# Patient Record
Sex: Female | Born: 1945 | Race: White | Hispanic: No | Marital: Married | Smoking: Never smoker
Health system: Southern US, Community
[De-identification: ages and names within clinical notes are randomized; demographics above are authoritative.]

## PROBLEM LIST (undated history)

## (undated) DIAGNOSIS — M1990 Unspecified osteoarthritis, unspecified site: Secondary | ICD-10-CM

## (undated) DIAGNOSIS — I272 Pulmonary hypertension, unspecified: Secondary | ICD-10-CM

## (undated) DIAGNOSIS — N390 Urinary tract infection, site not specified: Secondary | ICD-10-CM

## (undated) DIAGNOSIS — M719 Bursopathy, unspecified: Secondary | ICD-10-CM

## (undated) DIAGNOSIS — J42 Unspecified chronic bronchitis: Secondary | ICD-10-CM

## (undated) DIAGNOSIS — R4189 Other symptoms and signs involving cognitive functions and awareness: Secondary | ICD-10-CM

## (undated) DIAGNOSIS — E119 Type 2 diabetes mellitus without complications: Secondary | ICD-10-CM

## (undated) DIAGNOSIS — G259 Extrapyramidal and movement disorder, unspecified: Secondary | ICD-10-CM

## (undated) DIAGNOSIS — G20A1 Parkinson's disease without dyskinesia, without mention of fluctuations: Secondary | ICD-10-CM

## (undated) DIAGNOSIS — K219 Gastro-esophageal reflux disease without esophagitis: Secondary | ICD-10-CM

## (undated) DIAGNOSIS — G709 Myoneural disorder, unspecified: Secondary | ICD-10-CM

## (undated) DIAGNOSIS — H269 Unspecified cataract: Secondary | ICD-10-CM

## (undated) DIAGNOSIS — M159 Polyosteoarthritis, unspecified: Secondary | ICD-10-CM

## (undated) DIAGNOSIS — F419 Anxiety disorder, unspecified: Secondary | ICD-10-CM

## (undated) DIAGNOSIS — G20 Parkinson's disease: Secondary | ICD-10-CM

## (undated) DIAGNOSIS — Z96659 Presence of unspecified artificial knee joint: Secondary | ICD-10-CM

## (undated) DIAGNOSIS — M479 Spondylosis, unspecified: Secondary | ICD-10-CM

## (undated) DIAGNOSIS — K449 Diaphragmatic hernia without obstruction or gangrene: Secondary | ICD-10-CM

## (undated) DIAGNOSIS — F4321 Adjustment disorder with depressed mood: Secondary | ICD-10-CM

## (undated) DIAGNOSIS — M549 Dorsalgia, unspecified: Secondary | ICD-10-CM

## (undated) DIAGNOSIS — F1910 Other psychoactive substance abuse, uncomplicated: Secondary | ICD-10-CM

## (undated) DIAGNOSIS — M546 Pain in thoracic spine: Secondary | ICD-10-CM

## (undated) DIAGNOSIS — K229 Disease of esophagus, unspecified: Secondary | ICD-10-CM

## (undated) DIAGNOSIS — M67919 Unspecified disorder of synovium and tendon, unspecified shoulder: Secondary | ICD-10-CM

## (undated) DIAGNOSIS — G5600 Carpal tunnel syndrome, unspecified upper limb: Secondary | ICD-10-CM

## (undated) DIAGNOSIS — I10 Essential (primary) hypertension: Secondary | ICD-10-CM

## (undated) DIAGNOSIS — M419 Scoliosis, unspecified: Secondary | ICD-10-CM

## (undated) DIAGNOSIS — K209 Esophagitis, unspecified without bleeding: Secondary | ICD-10-CM

## (undated) DIAGNOSIS — F0390 Unspecified dementia without behavioral disturbance: Secondary | ICD-10-CM

## (undated) DIAGNOSIS — E785 Hyperlipidemia, unspecified: Secondary | ICD-10-CM

## (undated) DIAGNOSIS — Z8739 Personal history of other diseases of the musculoskeletal system and connective tissue: Secondary | ICD-10-CM

## (undated) DIAGNOSIS — F339 Major depressive disorder, recurrent, unspecified: Secondary | ICD-10-CM

## (undated) DIAGNOSIS — K222 Esophageal obstruction: Secondary | ICD-10-CM

## (undated) DIAGNOSIS — K2210 Ulcer of esophagus without bleeding: Secondary | ICD-10-CM

## (undated) DIAGNOSIS — T4145XA Adverse effect of unspecified anesthetic, initial encounter: Secondary | ICD-10-CM

## (undated) DIAGNOSIS — G8929 Other chronic pain: Secondary | ICD-10-CM

## (undated) DIAGNOSIS — M4120 Other idiopathic scoliosis, site unspecified: Secondary | ICD-10-CM

## (undated) DIAGNOSIS — T7840XA Allergy, unspecified, initial encounter: Secondary | ICD-10-CM

## (undated) HISTORY — DX: Type 2 diabetes mellitus without complications: E11.9

## (undated) HISTORY — DX: Other idiopathic scoliosis, site unspecified: M41.20

## (undated) HISTORY — PX: CHOLECYSTECTOMY OPEN: SUR202

## (undated) HISTORY — DX: Other psychoactive substance abuse, uncomplicated: F19.10

## (undated) HISTORY — DX: Unspecified osteoarthritis, unspecified site: M19.90

## (undated) HISTORY — PX: ESOPHAGOGASTRIC FUNDOPLASTY: SUR458

## (undated) HISTORY — PX: TUBAL LIGATION: SHX77

## (undated) HISTORY — DX: Pulmonary hypertension, unspecified: I27.20

## (undated) HISTORY — DX: Polyosteoarthritis, unspecified: M15.9

## (undated) HISTORY — PX: JOINT REPLACEMENT: SHX530

## (undated) HISTORY — PX: COLON SURGERY: SHX602

## (undated) HISTORY — DX: Major depressive disorder, recurrent, unspecified: F33.9

## (undated) HISTORY — PX: DILATION AND CURETTAGE OF UTERUS: SHX78

## (undated) HISTORY — DX: Urinary tract infection, site not specified: N39.0

## (undated) HISTORY — DX: Diaphragmatic hernia without obstruction or gangrene: K44.9

## (undated) HISTORY — DX: Hyperlipidemia, unspecified: E78.5

## (undated) HISTORY — DX: Essential (primary) hypertension: I10

## (undated) HISTORY — DX: Scoliosis, unspecified: M41.9

## (undated) HISTORY — DX: Disease of esophagus, unspecified: K22.9

## (undated) HISTORY — DX: Bursopathy, unspecified: M71.9

## (undated) HISTORY — DX: Carpal tunnel syndrome, unspecified upper limb: G56.00

## (undated) HISTORY — DX: Ulcer of esophagus without bleeding: K22.10

## (undated) HISTORY — DX: Anxiety disorder, unspecified: F41.9

## (undated) HISTORY — DX: Myoneural disorder, unspecified: G70.9

## (undated) HISTORY — PX: ABDOMINAL HYSTERECTOMY: SHX81

## (undated) HISTORY — DX: Unspecified cataract: H26.9

## (undated) HISTORY — DX: Presence of unspecified artificial knee joint: Z96.659

## (undated) HISTORY — DX: Esophageal obstruction: K22.2

## (undated) HISTORY — DX: Unspecified disorder of synovium and tendon, unspecified shoulder: M67.919

## (undated) HISTORY — DX: Extrapyramidal and movement disorder, unspecified: G25.9

## (undated) HISTORY — DX: Spondylosis, unspecified: M47.9

## (undated) HISTORY — DX: Allergy, unspecified, initial encounter: T78.40XA

---

## 1968-12-13 DIAGNOSIS — Z8739 Personal history of other diseases of the musculoskeletal system and connective tissue: Secondary | ICD-10-CM

## 1968-12-13 HISTORY — DX: Personal history of other diseases of the musculoskeletal system and connective tissue: Z87.39

## 2001-03-20 ENCOUNTER — Encounter: Payer: Self-pay | Admitting: Emergency Medicine

## 2001-03-20 ENCOUNTER — Emergency Department (HOSPITAL_COMMUNITY): Admission: EM | Admit: 2001-03-20 | Discharge: 2001-03-20 | Payer: Self-pay | Admitting: Emergency Medicine

## 2002-10-03 ENCOUNTER — Encounter: Payer: Self-pay | Admitting: Emergency Medicine

## 2002-10-03 ENCOUNTER — Emergency Department (HOSPITAL_COMMUNITY): Admission: EM | Admit: 2002-10-03 | Discharge: 2002-10-03 | Payer: Self-pay | Admitting: Emergency Medicine

## 2003-02-22 ENCOUNTER — Ambulatory Visit (HOSPITAL_COMMUNITY): Admission: RE | Admit: 2003-02-22 | Discharge: 2003-02-22 | Payer: Self-pay | Admitting: Sports Medicine

## 2003-02-22 ENCOUNTER — Encounter: Admission: RE | Admit: 2003-02-22 | Discharge: 2003-02-22 | Payer: Self-pay | Admitting: Family Medicine

## 2003-02-27 ENCOUNTER — Ambulatory Visit (HOSPITAL_COMMUNITY): Admission: RE | Admit: 2003-02-27 | Discharge: 2003-02-27 | Payer: Self-pay | Admitting: General Surgery

## 2003-02-27 ENCOUNTER — Ambulatory Visit (HOSPITAL_BASED_OUTPATIENT_CLINIC_OR_DEPARTMENT_OTHER): Admission: RE | Admit: 2003-02-27 | Discharge: 2003-02-27 | Payer: Self-pay | Admitting: General Surgery

## 2003-02-27 ENCOUNTER — Encounter (INDEPENDENT_AMBULATORY_CARE_PROVIDER_SITE_OTHER): Payer: Self-pay | Admitting: Specialist

## 2003-05-31 ENCOUNTER — Encounter: Admission: RE | Admit: 2003-05-31 | Discharge: 2003-05-31 | Payer: Self-pay | Admitting: Family Medicine

## 2003-06-06 ENCOUNTER — Encounter: Admission: RE | Admit: 2003-06-06 | Discharge: 2003-06-06 | Payer: Self-pay | Admitting: Family Medicine

## 2003-06-23 ENCOUNTER — Encounter: Admission: RE | Admit: 2003-06-23 | Discharge: 2003-06-23 | Payer: Self-pay | Admitting: Sports Medicine

## 2003-07-05 ENCOUNTER — Encounter: Admission: RE | Admit: 2003-07-05 | Discharge: 2003-07-05 | Payer: Self-pay | Admitting: Family Medicine

## 2003-08-22 ENCOUNTER — Encounter: Admission: RE | Admit: 2003-08-22 | Discharge: 2003-08-22 | Payer: Self-pay | Admitting: Family Medicine

## 2003-10-13 ENCOUNTER — Encounter: Admission: RE | Admit: 2003-10-13 | Discharge: 2003-10-13 | Payer: Self-pay | Admitting: Family Medicine

## 2003-10-18 ENCOUNTER — Encounter: Admission: RE | Admit: 2003-10-18 | Discharge: 2003-10-18 | Payer: Self-pay | Admitting: Sports Medicine

## 2003-11-21 ENCOUNTER — Encounter: Admission: RE | Admit: 2003-11-21 | Discharge: 2003-11-21 | Payer: Self-pay | Admitting: Family Medicine

## 2003-11-28 ENCOUNTER — Encounter: Admission: RE | Admit: 2003-11-28 | Discharge: 2003-11-28 | Payer: Self-pay | Admitting: Family Medicine

## 2004-01-09 ENCOUNTER — Ambulatory Visit: Payer: Self-pay | Admitting: Sports Medicine

## 2004-01-12 ENCOUNTER — Ambulatory Visit: Payer: Self-pay | Admitting: Family Medicine

## 2004-02-13 ENCOUNTER — Ambulatory Visit: Payer: Self-pay | Admitting: Sports Medicine

## 2004-02-13 ENCOUNTER — Encounter (INDEPENDENT_AMBULATORY_CARE_PROVIDER_SITE_OTHER): Payer: Self-pay | Admitting: Sports Medicine

## 2004-02-13 ENCOUNTER — Encounter (INDEPENDENT_AMBULATORY_CARE_PROVIDER_SITE_OTHER): Payer: Self-pay | Admitting: *Deleted

## 2004-02-13 LAB — CONVERTED CEMR LAB

## 2004-02-15 ENCOUNTER — Emergency Department (HOSPITAL_COMMUNITY): Admission: EM | Admit: 2004-02-15 | Discharge: 2004-02-15 | Payer: Self-pay | Admitting: Emergency Medicine

## 2004-03-04 ENCOUNTER — Emergency Department (HOSPITAL_COMMUNITY): Admission: EM | Admit: 2004-03-04 | Discharge: 2004-03-04 | Payer: Self-pay | Admitting: Family Medicine

## 2004-03-13 ENCOUNTER — Ambulatory Visit: Payer: Self-pay | Admitting: Family Medicine

## 2004-03-26 ENCOUNTER — Ambulatory Visit: Payer: Self-pay | Admitting: Sports Medicine

## 2004-04-16 ENCOUNTER — Ambulatory Visit: Payer: Self-pay | Admitting: Sports Medicine

## 2004-05-30 ENCOUNTER — Emergency Department (HOSPITAL_COMMUNITY): Admission: EM | Admit: 2004-05-30 | Discharge: 2004-05-30 | Payer: Self-pay | Admitting: *Deleted

## 2004-08-21 ENCOUNTER — Ambulatory Visit: Payer: Self-pay | Admitting: Sports Medicine

## 2004-11-05 ENCOUNTER — Emergency Department (HOSPITAL_COMMUNITY): Admission: EM | Admit: 2004-11-05 | Discharge: 2004-11-05 | Payer: Self-pay | Admitting: Emergency Medicine

## 2004-12-31 ENCOUNTER — Ambulatory Visit: Payer: Self-pay | Admitting: Sports Medicine

## 2005-01-02 ENCOUNTER — Ambulatory Visit: Payer: Self-pay | Admitting: Family Medicine

## 2005-02-02 ENCOUNTER — Emergency Department (HOSPITAL_COMMUNITY): Admission: EM | Admit: 2005-02-02 | Discharge: 2005-02-02 | Payer: Self-pay | Admitting: Family Medicine

## 2005-02-10 ENCOUNTER — Ambulatory Visit: Payer: Self-pay | Admitting: Sports Medicine

## 2005-04-01 ENCOUNTER — Ambulatory Visit: Payer: Self-pay | Admitting: Sports Medicine

## 2005-07-22 ENCOUNTER — Ambulatory Visit: Payer: Self-pay | Admitting: Sports Medicine

## 2005-07-30 ENCOUNTER — Ambulatory Visit: Payer: Self-pay | Admitting: Family Medicine

## 2005-08-12 ENCOUNTER — Ambulatory Visit: Payer: Self-pay | Admitting: Sports Medicine

## 2005-08-20 ENCOUNTER — Ambulatory Visit (HOSPITAL_COMMUNITY): Admission: RE | Admit: 2005-08-20 | Discharge: 2005-08-20 | Payer: Self-pay | Admitting: Sports Medicine

## 2005-08-20 ENCOUNTER — Encounter: Admission: RE | Admit: 2005-08-20 | Discharge: 2005-09-11 | Payer: Self-pay | Admitting: Sports Medicine

## 2005-08-29 ENCOUNTER — Ambulatory Visit: Payer: Self-pay | Admitting: Family Medicine

## 2005-09-23 ENCOUNTER — Encounter (INDEPENDENT_AMBULATORY_CARE_PROVIDER_SITE_OTHER): Payer: Self-pay | Admitting: Sports Medicine

## 2005-09-23 LAB — CONVERTED CEMR LAB: Creatinine, Ser: 1.1 mg/dL

## 2005-09-30 ENCOUNTER — Ambulatory Visit: Payer: Self-pay | Admitting: Sports Medicine

## 2005-10-13 ENCOUNTER — Encounter: Payer: Self-pay | Admitting: Physician Assistant

## 2005-11-10 ENCOUNTER — Encounter: Admission: RE | Admit: 2005-11-10 | Discharge: 2005-11-10 | Payer: Self-pay | Admitting: Sports Medicine

## 2005-11-11 ENCOUNTER — Ambulatory Visit: Payer: Self-pay | Admitting: Family Medicine

## 2005-12-11 ENCOUNTER — Encounter: Admission: RE | Admit: 2005-12-11 | Discharge: 2005-12-11 | Payer: Self-pay | Admitting: Sports Medicine

## 2005-12-24 ENCOUNTER — Ambulatory Visit: Payer: Self-pay | Admitting: Sports Medicine

## 2005-12-26 ENCOUNTER — Ambulatory Visit: Payer: Self-pay | Admitting: Family Medicine

## 2006-02-10 ENCOUNTER — Ambulatory Visit: Payer: Self-pay | Admitting: Sports Medicine

## 2006-06-11 DIAGNOSIS — F339 Major depressive disorder, recurrent, unspecified: Secondary | ICD-10-CM

## 2006-06-11 HISTORY — DX: Major depressive disorder, recurrent, unspecified: F33.9

## 2006-06-12 ENCOUNTER — Encounter (INDEPENDENT_AMBULATORY_CARE_PROVIDER_SITE_OTHER): Payer: Self-pay | Admitting: *Deleted

## 2006-08-04 ENCOUNTER — Ambulatory Visit: Payer: Self-pay | Admitting: Sports Medicine

## 2006-08-04 DIAGNOSIS — M159 Polyosteoarthritis, unspecified: Secondary | ICD-10-CM | POA: Insufficient documentation

## 2006-08-04 HISTORY — DX: Polyosteoarthritis, unspecified: M15.9

## 2006-08-04 LAB — CONVERTED CEMR LAB
ALT: 12 units/L (ref 0–35)
AST: 13 units/L (ref 0–37)
Albumin: 4.2 g/dL (ref 3.5–5.2)
Alkaline Phosphatase: 77 units/L (ref 39–117)
BUN: 16 mg/dL (ref 6–23)
CO2: 22 meq/L (ref 19–32)
Calcium: 9.6 mg/dL (ref 8.4–10.5)
Chloride: 105 meq/L (ref 96–112)
Cholesterol: 186 mg/dL (ref 0–200)
Creatinine, Ser: 1.08 mg/dL (ref 0.40–1.20)
Glucose, Bld: 100 mg/dL — ABNORMAL HIGH (ref 70–99)
HCT: 38 %
HDL: 44 mg/dL (ref >39)
Hemoglobin: 13.2 g/dL
Hgb A1c MFr Bld: 5.5 %
LDL Cholesterol: 81 mg/dL (ref 0–99)
MCV: 94 fL
Platelets: 348 10*3/uL
Potassium: 3.5 meq/L (ref 3.5–5.3)
RBC: 4.04 M/uL
Sodium: 143 meq/L (ref 135–145)
TSH: 2.069 microintl units/mL (ref 0.350–5.50)
Total Bilirubin: 0.3 mg/dL (ref 0.3–1.2)
Total CHOL/HDL Ratio: 4.2
Total Protein: 6.9 g/dL (ref 6.0–8.3)
Triglycerides: 304 mg/dL — ABNORMAL HIGH (ref <150)
VLDL: 61 mg/dL — ABNORMAL HIGH (ref 0–40)
WBC: 7.7 10*3/uL

## 2006-08-05 ENCOUNTER — Encounter (INDEPENDENT_AMBULATORY_CARE_PROVIDER_SITE_OTHER): Payer: Self-pay | Admitting: Sports Medicine

## 2006-08-05 DIAGNOSIS — E119 Type 2 diabetes mellitus without complications: Secondary | ICD-10-CM

## 2006-08-05 DIAGNOSIS — I10 Essential (primary) hypertension: Secondary | ICD-10-CM | POA: Insufficient documentation

## 2006-08-05 DIAGNOSIS — E785 Hyperlipidemia, unspecified: Secondary | ICD-10-CM | POA: Insufficient documentation

## 2006-08-05 HISTORY — DX: Type 2 diabetes mellitus without complications: E11.9

## 2006-08-06 ENCOUNTER — Telehealth: Payer: Self-pay | Admitting: *Deleted

## 2006-08-07 ENCOUNTER — Telehealth: Payer: Self-pay | Admitting: *Deleted

## 2006-08-17 ENCOUNTER — Ambulatory Visit: Payer: Self-pay | Admitting: Cardiology

## 2006-08-31 ENCOUNTER — Ambulatory Visit: Payer: Self-pay | Admitting: Cardiology

## 2006-10-12 ENCOUNTER — Ambulatory Visit: Payer: Self-pay

## 2006-10-12 ENCOUNTER — Encounter: Payer: Self-pay | Admitting: Family Medicine

## 2006-11-16 ENCOUNTER — Encounter: Payer: Self-pay | Admitting: Family Medicine

## 2006-11-19 ENCOUNTER — Telehealth: Payer: Self-pay | Admitting: *Deleted

## 2006-11-19 ENCOUNTER — Ambulatory Visit: Payer: Self-pay | Admitting: Family Medicine

## 2006-12-15 ENCOUNTER — Encounter: Payer: Self-pay | Admitting: Family Medicine

## 2006-12-17 ENCOUNTER — Ambulatory Visit: Payer: Self-pay | Admitting: Sports Medicine

## 2006-12-25 ENCOUNTER — Encounter: Payer: Self-pay | Admitting: Family Medicine

## 2007-01-04 ENCOUNTER — Emergency Department (HOSPITAL_COMMUNITY): Admission: EM | Admit: 2007-01-04 | Discharge: 2007-01-04 | Payer: Self-pay | Admitting: Family Medicine

## 2007-01-05 ENCOUNTER — Encounter (INDEPENDENT_AMBULATORY_CARE_PROVIDER_SITE_OTHER): Payer: Self-pay | Admitting: Family Medicine

## 2007-01-05 ENCOUNTER — Telehealth: Payer: Self-pay | Admitting: *Deleted

## 2007-01-05 ENCOUNTER — Ambulatory Visit: Payer: Self-pay | Admitting: Family Medicine

## 2007-01-27 ENCOUNTER — Ambulatory Visit: Payer: Self-pay | Admitting: Family Medicine

## 2007-01-27 LAB — CONVERTED CEMR LAB
ALT: 13 units/L (ref 0–35)
AST: 15 units/L (ref 0–37)
Albumin: 4.3 g/dL (ref 3.5–5.2)
Alkaline Phosphatase: 83 units/L (ref 39–117)
BUN: 16 mg/dL (ref 6–23)
CO2: 23 meq/L (ref 19–32)
Calcium: 9.5 mg/dL (ref 8.4–10.5)
Chloride: 104 meq/L (ref 96–112)
Cholesterol: 169 mg/dL (ref 0–200)
Creatinine, Ser: 0.94 mg/dL (ref 0.40–1.20)
Glucose, Bld: 125 mg/dL — ABNORMAL HIGH (ref 70–99)
HDL: 46 mg/dL (ref >39)
Hgb A1c MFr Bld: 5.7 %
LDL Cholesterol: 89 mg/dL (ref 0–99)
Potassium: 3.7 meq/L (ref 3.5–5.3)
Sodium: 142 meq/L (ref 135–145)
Total Bilirubin: 0.4 mg/dL (ref 0.3–1.2)
Total CHOL/HDL Ratio: 3.7
Total Protein: 6.7 g/dL (ref 6.0–8.3)
Triglycerides: 169 mg/dL — ABNORMAL HIGH (ref <150)
VLDL: 34 mg/dL (ref 0–40)

## 2007-01-29 ENCOUNTER — Ambulatory Visit: Payer: Self-pay | Admitting: Internal Medicine

## 2007-02-01 ENCOUNTER — Encounter: Payer: Self-pay | Admitting: Family Medicine

## 2007-02-01 DIAGNOSIS — K222 Esophageal obstruction: Secondary | ICD-10-CM | POA: Insufficient documentation

## 2007-03-30 ENCOUNTER — Ambulatory Visit: Payer: Self-pay | Admitting: Sports Medicine

## 2007-06-18 ENCOUNTER — Telehealth: Payer: Self-pay | Admitting: *Deleted

## 2007-06-28 ENCOUNTER — Telehealth: Payer: Self-pay | Admitting: Family Medicine

## 2007-08-18 ENCOUNTER — Ambulatory Visit: Payer: Self-pay | Admitting: Family Medicine

## 2007-08-18 DIAGNOSIS — D499 Neoplasm of unspecified behavior of unspecified site: Secondary | ICD-10-CM | POA: Insufficient documentation

## 2007-08-23 ENCOUNTER — Encounter: Payer: Self-pay | Admitting: *Deleted

## 2007-10-06 ENCOUNTER — Ambulatory Visit: Payer: Self-pay | Admitting: Family Medicine

## 2007-10-06 LAB — CONVERTED CEMR LAB: Hgb A1c MFr Bld: 5.9 %

## 2007-10-07 ENCOUNTER — Encounter: Payer: Self-pay | Admitting: Family Medicine

## 2007-10-07 ENCOUNTER — Telehealth: Payer: Self-pay | Admitting: *Deleted

## 2007-10-07 ENCOUNTER — Observation Stay (HOSPITAL_COMMUNITY): Admission: EM | Admit: 2007-10-07 | Discharge: 2007-10-08 | Payer: Self-pay | Admitting: Emergency Medicine

## 2007-10-07 ENCOUNTER — Ambulatory Visit: Payer: Self-pay | Admitting: Family Medicine

## 2007-10-10 IMAGING — CT CT ANGIO CHEST
1 of 4 series · 12 of 36 positions shown · IV contrast (Omnipaque 300)
Comparison: none

CLINICAL DATA: chest and upper back pain

[Series 2: thoracica_wo 3.0 b30f st · axial · 0.73mm/px · z∈[-269,-38]mm · 12 of 91 slices shown]
[im 7/91  lung]
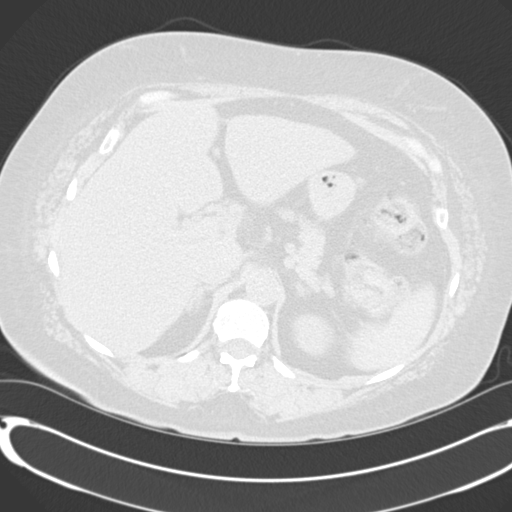
[im 14/91  mediastinal]
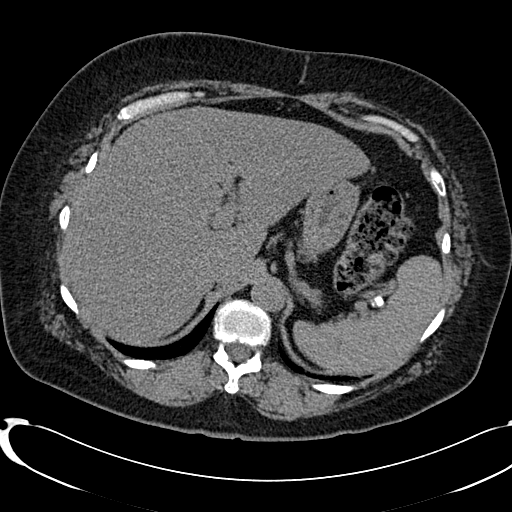
[im 21/91  lung]
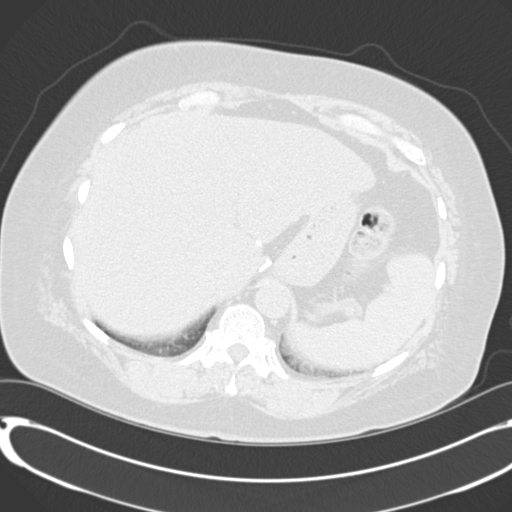
[im 28/91  mediastinal]
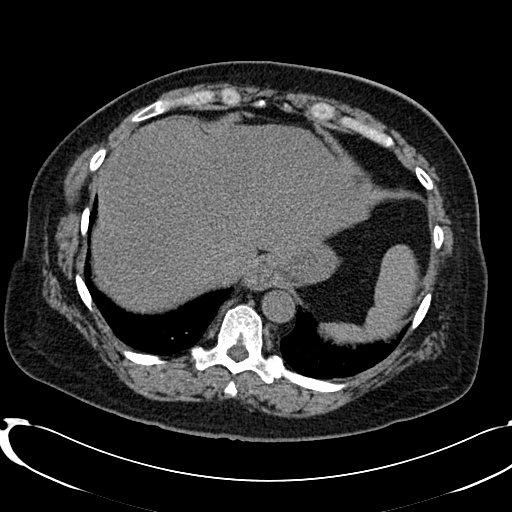
[im 35/91  lung]
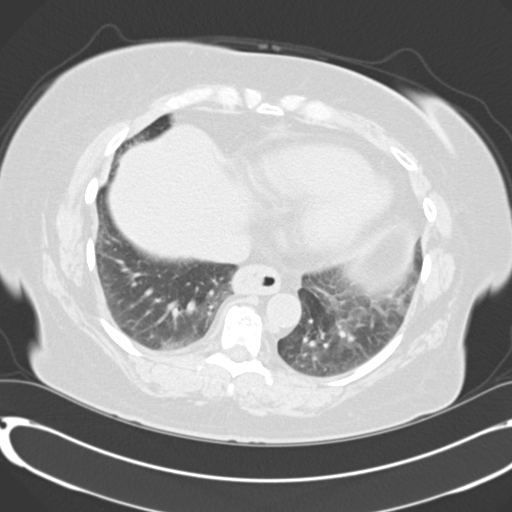
[im 42/91  mediastinal]
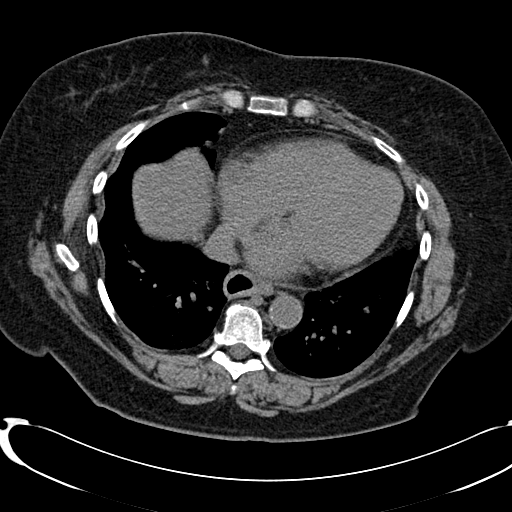
[im 49/91  lung]
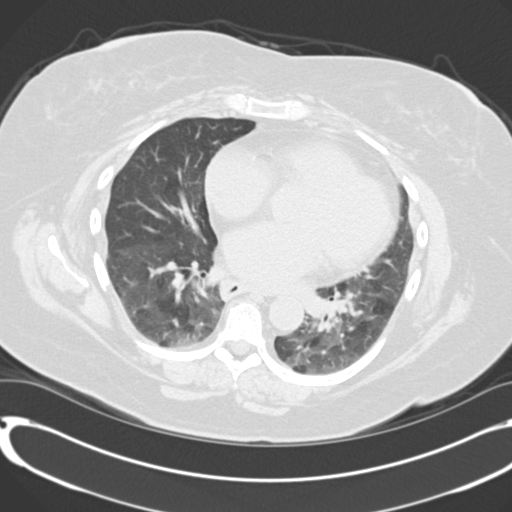
[im 56/91  mediastinal]
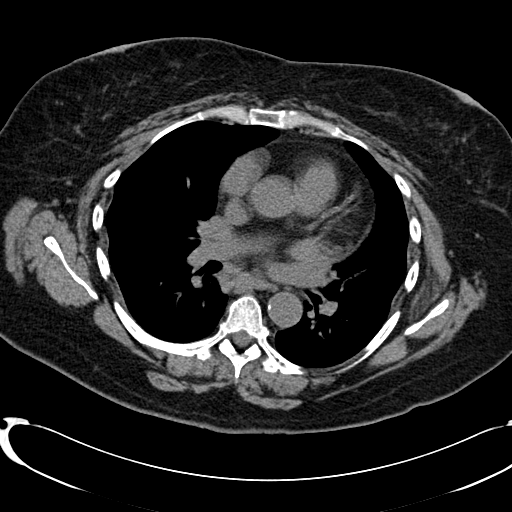
[im 63/91  lung]
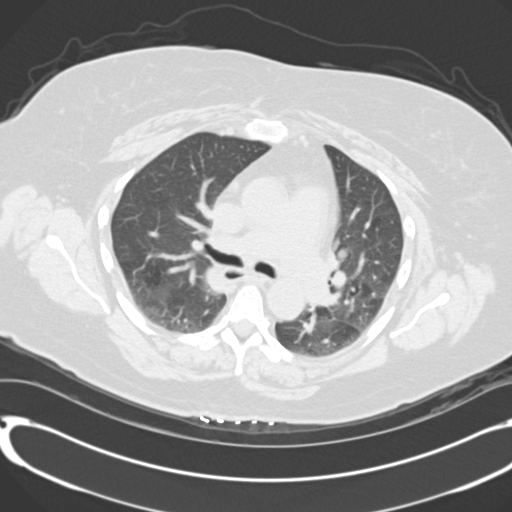
[im 70/91  mediastinal]
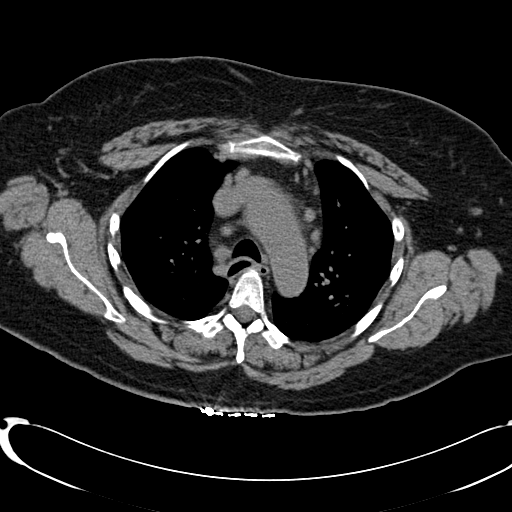
[im 77/91  lung]
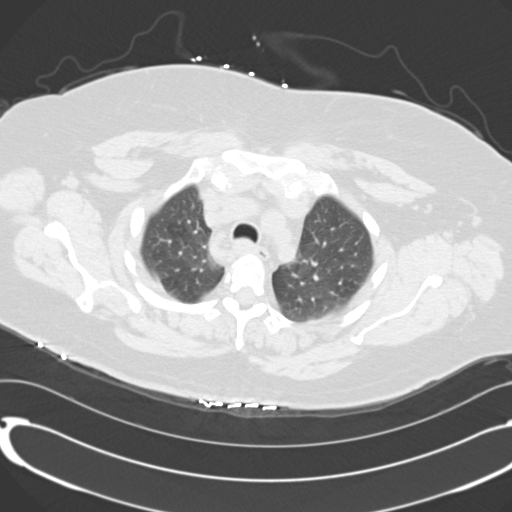
[im 84/91  mediastinal]
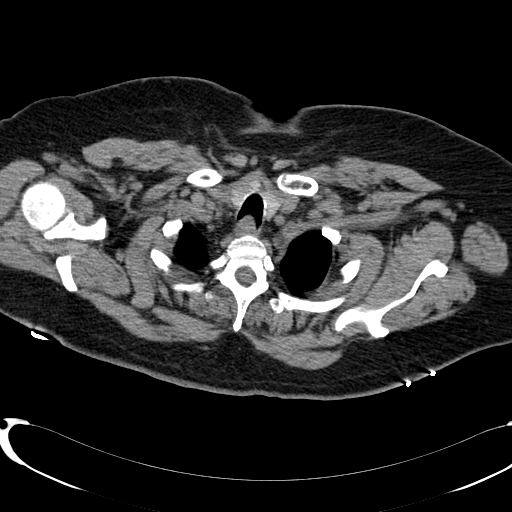

[12 of 36 positions shown; findings below may reference images not displayed]

CT angiogram chest with contrast:

Multidetector helical CT of the chest was obtained after  100 ml Omnipaque 300 
IV. CT multiplanar reconstructions were rendered to evaluate the vascular
anatomy.
No previous for comparison. The noncontrast scan shows patchy coronary and
aortic calcifications. No pleural or pericardial effusion. Vascular clips at GE
junction.

CT angiogram shows minimal atheromatous irregularity of the thoracic aorta
without dissection or stenosis. Classic branch anatomy of the brachiocephalic
arteries which are mildly tortuous without proximal stenosis. Good contrast
opacification of pulmonary artery branches without discrete filling defect to
suggest acute PE. Some images are degraded by patient breathing during the scan.
Subcentimeter right paratracheal, anterior mediastinal, and precarinal lymph
nodes are noted. No hilar adenopathy. Patchy groundglass opacities in the
bilateral upper lobes and dependent portions of both lower lobes without
confluent air space infiltrate. Visualized upper abdomen unremarkable. Coronal
and sagittal reconstructions confirm the above findings. Minimal spondylitic
changes in the thoracic spine.
IMPRESSION: 1. Negative for thoracic aortic dissection, aneurysm, or acute PE.
2. Patchy groundglass opacities bilaterally which are nonspecific, may represent
alveolitis or subsegmental atelectasis.
3. Coronary and aortic calcifications.

## 2007-10-12 ENCOUNTER — Telehealth: Payer: Self-pay | Admitting: *Deleted

## 2007-12-01 ENCOUNTER — Ambulatory Visit: Payer: Self-pay | Admitting: Family Medicine

## 2007-12-01 LAB — CONVERTED CEMR LAB
ALT: 21 units/L (ref 0–35)
AST: 23 units/L (ref 0–37)
Albumin: 4.4 g/dL (ref 3.5–5.2)
Alkaline Phosphatase: 88 units/L (ref 39–117)
BUN: 12 mg/dL (ref 6–23)
CO2: 23 meq/L (ref 19–32)
Calcium: 9.3 mg/dL (ref 8.4–10.5)
Chloride: 104 meq/L (ref 96–112)
Creatinine, Ser: 0.87 mg/dL (ref 0.40–1.20)
Glucose, Bld: 112 mg/dL — ABNORMAL HIGH (ref 70–99)
Hemoglobin: 13.2 g/dL
Potassium: 3.7 meq/L (ref 3.5–5.3)
Sodium: 143 meq/L (ref 135–145)
TSH: 1.349 microintl units/mL (ref 0.350–4.50)
Total Bilirubin: 0.6 mg/dL (ref 0.3–1.2)
Total Protein: 7 g/dL (ref 6.0–8.3)

## 2007-12-02 ENCOUNTER — Encounter: Payer: Self-pay | Admitting: Family Medicine

## 2007-12-21 ENCOUNTER — Telehealth: Payer: Self-pay | Admitting: *Deleted

## 2007-12-24 ENCOUNTER — Encounter: Admission: RE | Admit: 2007-12-24 | Discharge: 2007-12-24 | Payer: Self-pay | Admitting: Family Medicine

## 2007-12-27 ENCOUNTER — Telehealth: Payer: Self-pay | Admitting: *Deleted

## 2007-12-31 ENCOUNTER — Telehealth: Payer: Self-pay | Admitting: *Deleted

## 2008-01-05 ENCOUNTER — Telehealth: Payer: Self-pay | Admitting: Family Medicine

## 2008-01-05 ENCOUNTER — Ambulatory Visit: Payer: Self-pay | Admitting: Family Medicine

## 2008-01-05 DIAGNOSIS — G5600 Carpal tunnel syndrome, unspecified upper limb: Secondary | ICD-10-CM

## 2008-01-05 HISTORY — DX: Carpal tunnel syndrome, unspecified upper limb: G56.00

## 2008-01-05 LAB — CONVERTED CEMR LAB
Hgb A1c MFr Bld: 6.2 %
Vitamin B-12: 301 pg/mL (ref 211–911)

## 2008-01-10 ENCOUNTER — Ambulatory Visit: Payer: Self-pay | Admitting: Family Medicine

## 2008-01-20 ENCOUNTER — Encounter: Payer: Self-pay | Admitting: Family Medicine

## 2008-03-08 ENCOUNTER — Ambulatory Visit: Payer: Self-pay | Admitting: Family Medicine

## 2008-03-08 ENCOUNTER — Telehealth: Payer: Self-pay | Admitting: *Deleted

## 2008-03-22 ENCOUNTER — Encounter: Payer: Self-pay | Admitting: *Deleted

## 2008-03-22 ENCOUNTER — Encounter: Payer: Self-pay | Admitting: Family Medicine

## 2008-03-22 ENCOUNTER — Ambulatory Visit: Payer: Self-pay | Admitting: Family Medicine

## 2008-03-22 LAB — CONVERTED CEMR LAB
Hgb A1c MFr Bld: 6.1 %
Pap Smear: NORMAL

## 2008-03-24 ENCOUNTER — Telehealth: Payer: Self-pay | Admitting: *Deleted

## 2008-03-24 ENCOUNTER — Encounter: Payer: Self-pay | Admitting: Family Medicine

## 2008-03-29 ENCOUNTER — Telehealth: Payer: Self-pay | Admitting: Family Medicine

## 2008-03-30 ENCOUNTER — Emergency Department (HOSPITAL_COMMUNITY): Admission: EM | Admit: 2008-03-30 | Discharge: 2008-03-30 | Payer: Self-pay | Admitting: Family Medicine

## 2008-04-10 ENCOUNTER — Encounter: Payer: Self-pay | Admitting: Family Medicine

## 2008-04-11 ENCOUNTER — Encounter: Payer: Self-pay | Admitting: *Deleted

## 2008-04-11 ENCOUNTER — Telehealth: Payer: Self-pay | Admitting: *Deleted

## 2008-04-13 ENCOUNTER — Telehealth (INDEPENDENT_AMBULATORY_CARE_PROVIDER_SITE_OTHER): Payer: Self-pay | Admitting: *Deleted

## 2008-04-13 ENCOUNTER — Ambulatory Visit: Payer: Self-pay | Admitting: Family Medicine

## 2008-04-13 ENCOUNTER — Encounter: Payer: Self-pay | Admitting: Family Medicine

## 2008-04-13 LAB — CONVERTED CEMR LAB
ALT: 14 units/L (ref 0–35)
AST: 13 units/L (ref 0–37)
Albumin: 4 g/dL (ref 3.5–5.2)
Alkaline Phosphatase: 67 units/L (ref 39–117)
BUN: 18 mg/dL (ref 6–23)
CO2: 25 meq/L (ref 19–32)
Calcium: 9.2 mg/dL (ref 8.4–10.5)
Chloride: 101 meq/L (ref 96–112)
Cholesterol: 155 mg/dL (ref 0–200)
Creatinine, Ser: 1.03 mg/dL (ref 0.40–1.20)
Glucose, Bld: 110 mg/dL — ABNORMAL HIGH (ref 70–99)
HDL: 36 mg/dL — ABNORMAL LOW (ref >39)
Hgb A1c MFr Bld: 6.1 %
LDL Cholesterol: 72 mg/dL (ref 0–99)
Potassium: 3 meq/L — ABNORMAL LOW (ref 3.5–5.3)
Sodium: 139 meq/L (ref 135–145)
Total Bilirubin: 0.4 mg/dL (ref 0.3–1.2)
Total CHOL/HDL Ratio: 4.3
Total Protein: 6.4 g/dL (ref 6.0–8.3)
Triglycerides: 235 mg/dL — ABNORMAL HIGH (ref <150)
VLDL: 47 mg/dL — ABNORMAL HIGH (ref 0–40)

## 2008-04-19 ENCOUNTER — Encounter: Payer: Self-pay | Admitting: Family Medicine

## 2008-04-24 ENCOUNTER — Ambulatory Visit: Payer: Self-pay | Admitting: Family Medicine

## 2008-04-24 LAB — CONVERTED CEMR LAB
Bilirubin Urine: NEGATIVE
Glucose, Urine, Semiquant: NEGATIVE
Nitrite: POSITIVE
Protein, U semiquant: 100
Specific Gravity, Urine: 1.025
Urobilinogen, UA: 1
pH: 6

## 2008-05-02 ENCOUNTER — Telehealth: Payer: Self-pay | Admitting: Family Medicine

## 2008-06-28 ENCOUNTER — Ambulatory Visit: Payer: Self-pay | Admitting: Family Medicine

## 2008-06-28 ENCOUNTER — Ambulatory Visit (HOSPITAL_COMMUNITY): Admission: RE | Admit: 2008-06-28 | Discharge: 2008-06-28 | Payer: Self-pay | Admitting: Family Medicine

## 2008-06-28 LAB — CONVERTED CEMR LAB
Bilirubin Urine: NEGATIVE
Glucose, Urine, Semiquant: NEGATIVE
Nitrite: NEGATIVE
Protein, U semiquant: 30
Specific Gravity, Urine: >=1.03
Urobilinogen, UA: 0.2
WBC Urine, dipstick: NEGATIVE
pH: 5.5

## 2008-07-05 ENCOUNTER — Telehealth: Payer: Self-pay | Admitting: Family Medicine

## 2008-07-05 ENCOUNTER — Encounter: Payer: Self-pay | Admitting: Family Medicine

## 2008-07-13 DIAGNOSIS — T8859XA Other complications of anesthesia, initial encounter: Secondary | ICD-10-CM

## 2008-07-13 HISTORY — PX: TOTAL KNEE ARTHROPLASTY: SHX125

## 2008-07-13 HISTORY — DX: Other complications of anesthesia, initial encounter: T88.59XA

## 2008-08-03 ENCOUNTER — Telehealth: Payer: Self-pay | Admitting: Family Medicine

## 2008-08-08 ENCOUNTER — Inpatient Hospital Stay (HOSPITAL_COMMUNITY): Admission: RE | Admit: 2008-08-08 | Discharge: 2008-08-12 | Payer: Self-pay | Admitting: Orthopaedic Surgery

## 2008-08-14 ENCOUNTER — Encounter: Payer: Self-pay | Admitting: Family Medicine

## 2008-08-14 ENCOUNTER — Telehealth: Payer: Self-pay | Admitting: *Deleted

## 2008-09-20 ENCOUNTER — Ambulatory Visit: Payer: Self-pay | Admitting: Family Medicine

## 2008-09-20 LAB — CONVERTED CEMR LAB
ALT: 12 units/L (ref 0–35)
AST: 14 units/L (ref 0–37)
Albumin: 4 g/dL (ref 3.5–5.2)
Alkaline Phosphatase: 74 units/L (ref 39–117)
BUN: 8 mg/dL (ref 6–23)
CO2: 23 meq/L (ref 19–32)
Calcium: 9.6 mg/dL (ref 8.4–10.5)
Chloride: 102 meq/L (ref 96–112)
Creatinine, Ser: 1.09 mg/dL (ref 0.40–1.20)
Glucose, Bld: 134 mg/dL — ABNORMAL HIGH (ref 70–99)
HCT: 41.4 % (ref 36.0–46.0)
Hemoglobin: 13 g/dL (ref 12.0–15.0)
Hgb A1c MFr Bld: 5.4 %
MCHC: 31.4 g/dL (ref 30.0–36.0)
MCV: 96.1 fL (ref 78.0–100.0)
Platelets: 413 10*3/uL — ABNORMAL HIGH (ref 150–400)
Potassium: 3.5 meq/L (ref 3.5–5.3)
RBC: 4.31 M/uL (ref 3.87–5.11)
RDW: 13.8 % (ref 11.5–15.5)
Sodium: 141 meq/L (ref 135–145)
TSH: 1.473 microintl units/mL (ref 0.350–4.500)
Total Bilirubin: 0.3 mg/dL (ref 0.3–1.2)
Total Protein: 6.8 g/dL (ref 6.0–8.3)
WBC: 6.8 10*3/uL (ref 4.0–10.5)

## 2008-09-21 ENCOUNTER — Telehealth: Payer: Self-pay | Admitting: *Deleted

## 2008-09-22 ENCOUNTER — Encounter: Payer: Self-pay | Admitting: Family Medicine

## 2008-10-26 ENCOUNTER — Telehealth: Payer: Self-pay | Admitting: *Deleted

## 2008-11-30 ENCOUNTER — Ambulatory Visit: Payer: Self-pay | Admitting: Family Medicine

## 2008-11-30 DIAGNOSIS — L819 Disorder of pigmentation, unspecified: Secondary | ICD-10-CM | POA: Insufficient documentation

## 2008-12-06 ENCOUNTER — Telehealth (INDEPENDENT_AMBULATORY_CARE_PROVIDER_SITE_OTHER): Payer: Self-pay | Admitting: *Deleted

## 2009-01-24 ENCOUNTER — Ambulatory Visit: Payer: Self-pay | Admitting: Family Medicine

## 2009-01-24 DIAGNOSIS — M67919 Unspecified disorder of synovium and tendon, unspecified shoulder: Secondary | ICD-10-CM

## 2009-01-24 DIAGNOSIS — M719 Bursopathy, unspecified: Secondary | ICD-10-CM

## 2009-01-24 HISTORY — DX: Unspecified disorder of synovium and tendon, unspecified shoulder: M67.919

## 2009-01-24 LAB — CONVERTED CEMR LAB
ALT: 11 units/L (ref 0–35)
AST: 16 units/L (ref 0–37)
Albumin: 4.4 g/dL (ref 3.5–5.2)
Alkaline Phosphatase: 69 units/L (ref 39–117)
BUN: 14 mg/dL (ref 6–23)
CO2: 26 meq/L (ref 19–32)
Calcium: 9.5 mg/dL (ref 8.4–10.5)
Chloride: 99 meq/L (ref 96–112)
Creatinine, Ser: 1 mg/dL (ref 0.40–1.20)
Direct LDL: 103 mg/dL — ABNORMAL HIGH
Glucose, Bld: 105 mg/dL — ABNORMAL HIGH (ref 70–99)
Hgb A1c MFr Bld: 5.3 %
Potassium: 3.6 meq/L (ref 3.5–5.3)
Sodium: 139 meq/L (ref 135–145)
Total Bilirubin: 0.5 mg/dL (ref 0.3–1.2)
Total Protein: 6.8 g/dL (ref 6.0–8.3)
Uric Acid, Serum: 6.5 mg/dL (ref 2.4–7.0)

## 2009-01-25 ENCOUNTER — Encounter (INDEPENDENT_AMBULATORY_CARE_PROVIDER_SITE_OTHER): Payer: Self-pay

## 2009-01-29 ENCOUNTER — Encounter: Payer: Self-pay | Admitting: Family Medicine

## 2009-02-05 ENCOUNTER — Encounter: Admission: RE | Admit: 2009-02-05 | Discharge: 2009-02-05 | Payer: Self-pay | Admitting: Family Medicine

## 2009-02-14 ENCOUNTER — Ambulatory Visit: Payer: Self-pay | Admitting: Family Medicine

## 2009-02-14 ENCOUNTER — Telehealth (INDEPENDENT_AMBULATORY_CARE_PROVIDER_SITE_OTHER): Payer: Self-pay | Admitting: Family Medicine

## 2009-02-14 ENCOUNTER — Encounter: Admission: RE | Admit: 2009-02-14 | Discharge: 2009-02-14 | Payer: Self-pay | Admitting: Family Medicine

## 2009-02-20 ENCOUNTER — Encounter: Admission: RE | Admit: 2009-02-20 | Discharge: 2009-02-20 | Payer: Self-pay | Admitting: Family Medicine

## 2009-02-28 ENCOUNTER — Encounter: Payer: Self-pay | Admitting: Family Medicine

## 2009-03-28 ENCOUNTER — Ambulatory Visit: Payer: Self-pay | Admitting: Family Medicine

## 2009-03-28 DIAGNOSIS — M479 Spondylosis, unspecified: Secondary | ICD-10-CM

## 2009-03-28 DIAGNOSIS — M4120 Other idiopathic scoliosis, site unspecified: Secondary | ICD-10-CM

## 2009-03-28 HISTORY — DX: Spondylosis, unspecified: M47.9

## 2009-03-28 HISTORY — DX: Other idiopathic scoliosis, site unspecified: M41.20

## 2009-06-04 ENCOUNTER — Encounter: Payer: Self-pay | Admitting: Family Medicine

## 2009-07-11 ENCOUNTER — Ambulatory Visit: Payer: Self-pay | Admitting: Family Medicine

## 2009-07-11 LAB — CONVERTED CEMR LAB
ALT: 15 units/L (ref 0–35)
AST: 19 units/L (ref 0–37)
Albumin: 4.5 g/dL (ref 3.5–5.2)
Alkaline Phosphatase: 71 units/L (ref 39–117)
BUN: 13 mg/dL (ref 6–23)
CO2: 22 meq/L (ref 19–32)
Calcium: 9.9 mg/dL (ref 8.4–10.5)
Chloride: 101 meq/L (ref 96–112)
Creatinine, Ser: 0.92 mg/dL (ref 0.40–1.20)
Glucose, Bld: 101 mg/dL — ABNORMAL HIGH (ref 70–99)
HCT: 39.2 % (ref 36.0–46.0)
Hemoglobin: 13.1 g/dL (ref 12.0–15.0)
Hgb A1c MFr Bld: 5.6 %
MCHC: 33.4 g/dL (ref 30.0–36.0)
MCV: 93.6 fL (ref 78.0–100.0)
Platelets: 332 10*3/uL (ref 150–400)
Potassium: 3.5 meq/L (ref 3.5–5.3)
RBC: 4.19 M/uL (ref 3.87–5.11)
RDW: 12.5 % (ref 11.5–15.5)
Sodium: 139 meq/L (ref 135–145)
TSH: 2.841 microintl units/mL (ref 0.350–4.500)
Total Bilirubin: 0.4 mg/dL (ref 0.3–1.2)
Total Protein: 7.2 g/dL (ref 6.0–8.3)
WBC: 6.2 10*3/uL (ref 4.0–10.5)

## 2009-07-13 ENCOUNTER — Encounter: Payer: Self-pay | Admitting: Family Medicine

## 2009-08-29 ENCOUNTER — Ambulatory Visit: Payer: Self-pay | Admitting: Family Medicine

## 2009-09-20 ENCOUNTER — Telehealth: Payer: Self-pay | Admitting: *Deleted

## 2009-09-21 ENCOUNTER — Telehealth (INDEPENDENT_AMBULATORY_CARE_PROVIDER_SITE_OTHER): Payer: Self-pay | Admitting: *Deleted

## 2009-10-03 ENCOUNTER — Telehealth (INDEPENDENT_AMBULATORY_CARE_PROVIDER_SITE_OTHER): Payer: Self-pay | Admitting: Family Medicine

## 2009-11-12 ENCOUNTER — Telehealth: Payer: Self-pay | Admitting: *Deleted

## 2009-12-18 ENCOUNTER — Telehealth: Payer: Self-pay | Admitting: *Deleted

## 2009-12-25 ENCOUNTER — Telehealth: Payer: Self-pay | Admitting: *Deleted

## 2009-12-31 ENCOUNTER — Encounter: Payer: Self-pay | Admitting: Family Medicine

## 2010-01-16 ENCOUNTER — Ambulatory Visit: Payer: Self-pay | Admitting: Family Medicine

## 2010-01-16 DIAGNOSIS — D649 Anemia, unspecified: Secondary | ICD-10-CM | POA: Insufficient documentation

## 2010-01-16 DIAGNOSIS — G20 Parkinson's disease: Secondary | ICD-10-CM | POA: Insufficient documentation

## 2010-01-16 LAB — CONVERTED CEMR LAB
ALT: 18 units/L (ref 0–35)
AST: 24 units/L (ref 0–37)
Albumin: 4.4 g/dL (ref 3.5–5.2)
Alkaline Phosphatase: 66 units/L (ref 39–117)
BUN: 11 mg/dL (ref 6–23)
CO2: 29 meq/L (ref 19–32)
Calcium: 9.9 mg/dL (ref 8.4–10.5)
Chloride: 101 meq/L (ref 96–112)
Creatinine, Ser: 0.89 mg/dL (ref 0.40–1.20)
Glucose, Bld: 109 mg/dL — ABNORMAL HIGH (ref 70–99)
HCT: 41.9 % (ref 36.0–46.0)
Hemoglobin: 13.7 g/dL (ref 12.0–15.0)
Hgb A1c MFr Bld: 5.6 %
MCHC: 32.7 g/dL (ref 30.0–36.0)
MCV: 96.8 fL (ref 78.0–100.0)
Platelets: 344 10*3/uL (ref 150–400)
Potassium: 3.8 meq/L (ref 3.5–5.3)
RBC: 4.33 M/uL (ref 3.87–5.11)
RDW: 12.7 % (ref 11.5–15.5)
Sodium: 140 meq/L (ref 135–145)
TSH: 1.574 microintl units/mL (ref 0.350–4.500)
Total Bilirubin: 0.4 mg/dL (ref 0.3–1.2)
Total Protein: 6.6 g/dL (ref 6.0–8.3)
WBC: 7.5 10*3/uL (ref 4.0–10.5)

## 2010-01-18 ENCOUNTER — Encounter: Payer: Self-pay | Admitting: Family Medicine

## 2010-04-17 ENCOUNTER — Ambulatory Visit
Admission: RE | Admit: 2010-04-17 | Discharge: 2010-04-17 | Payer: Self-pay | Source: Home / Self Care | Attending: Family Medicine | Admitting: Family Medicine

## 2010-04-18 ENCOUNTER — Telehealth: Payer: Self-pay | Admitting: Family Medicine

## 2010-04-26 ENCOUNTER — Encounter: Payer: Self-pay | Admitting: Family Medicine

## 2010-04-29 ENCOUNTER — Ambulatory Visit: Admission: RE | Admit: 2010-04-29 | Discharge: 2010-04-29 | Payer: Self-pay | Source: Home / Self Care

## 2010-05-05 ENCOUNTER — Encounter: Payer: Self-pay | Admitting: Sports Medicine

## 2010-05-14 NOTE — Progress Notes (Signed)
Summary: Appt and medication issue   Phone Note Call from Patient Call back at Home Phone [phone]   Summary of Call: Pt states she is to f/u with Dr. Jennette Kettle at the beginning of January, advised pt that MD is booked until 1/27 and pt states she has to be seen by her before that and that she also needs to discuss one of her meds with someone as there is a problem according to her.  Advised pt that rn would call her as soon as they could. Initial call taken by: Haydee Salter,  April 13, 2008 10:26 AM  Follow-up for Phone Call        Patient wants to d/c Wellbutrin and only take the Trazadone, feels she no longer needs to be on both for depression. Also patient states that she will need a refill on trazadone if Dr Jennette Kettle is unable to see her before 1/27. Message to MD Follow-up by: ASHA BENTON LPN,  April 13, 2008 11:15 AM  Additional Follow-up for Phone Call Additional follow up Details #1::        wpu;d recommend she stay on BOTH as we just restarted the wellbutrin. I had already called in a refill for the wellbutrin--have her let us know when that runs out and I can call in another then Thanks!  [name] NEAL MD  April 13, 2008 11:48 AM     Additional Follow-up for Phone Call Additional follow up Details #2::    Spoke with pt via phone advising Dr Jennette Kettle recommends she continue taking the wellbutrin and trazadone as she just started taking the wellbutrin- pt agreeable and advised to call office wihen she runs out of medication as Dr Jennette Kettle will be more than happy to refill meds. Follow-up by: Dedra Skeens CMA,,  April 17, 2008 10:24 AM

## 2010-05-14 NOTE — Progress Notes (Signed)
Summary: Rx   Phone Note Call from Patient Call back at Home Phone [phone] Call back at Work Phone [phone]   Summary of Call: wants to speak with Dr. Donnetta Hail nurse about "her medications." Initial call taken by: Haydee Salter,  May 02, 2008 9:23 AM  Follow-up for Phone Call        called pt. pt reports that after taking Trazedone she feels like there is 'no elephant sitting on her chest' anymore. after 7 1/2 years. wants to use it for an antidepressant. fwd. to Dr.Neal for review. Follow-up by: Arlyss Repress CMA,,  May 02, 2008 9:34 AM  Additional Follow-up for Phone Call Additional follow up Details #1::        DEAR WHITE TEAM I think she is wanting to stop the wellbutrin and just stay on trazodone--I suspect she would do bbetter on the CURRENT COMBO of both--see if indeed thatis what she is taking. But u;timately if she want to stop teh wellbutrin then ok let me know what she decides Thanks!  SARA NEAL MD  May 05, 2008 3:16 PM     Additional Follow-up for Phone Call Additional follow up Details #2::    pt states she did want to stop wellbutrin but wanted to know how you felt about it. Advised her of your recommendation to continue the combo. Pt agrees and states she will need an rx for trazadone. Pharmacy has been updated. She states this is not urgent and can wait until monday. Follow-up by: Alphia Kava,  May 05, 2008 3:58 PM    Prescriptions: TRAZODONE HCL 50 MG TABS (TRAZODONE HCL) 1/2 -1 by mouth 1 hour before bed time as needed insomnia  #30 x 3   Entered and Authorized by:   Denny Levy MD   Signed by:   Denny Levy MD on 05/08/2008   Method used:   [facility]  [address] [id number]       [address]       [place], [place]  [address]       Ph: [phone] or [phone]       Fax: [phone]   RxID:   [id number]

## 2010-05-14 NOTE — Letter (Signed)
Summary: LAB Letter  [facility]  [address]   [place], [place] [address]   Phone: [phone]  Fax: [phone]    01/29/2009  [facility] [address] [place], [place]  [address]  Dear Ms. Marcella,  Your A1C was great at 5.3. Your LDL cholesterol should be less than 100 and it looks good at 103. All of the other lab work was normal.         Sincerely,   Denny Levy MD  Appended Document: LAB Letter mailed.

## 2010-05-14 NOTE — Progress Notes (Signed)
    left message for [facility] that it was ok to fill all meds with a 90 day supply at a time per Dr. Sammie Bench RN  March 24, 2008 2:12 PM

## 2010-05-14 NOTE — Progress Notes (Signed)
Summary: Triage   Phone Note Call from Patient Call back at Home Phone [phone]   Reason for Call: Talk to Nurse Summary of Call: Pt states she has the flu and would like to know what she can do from home Initial call taken by: Haydee Salter,  December 31, 2007 9:05 AM  Follow-up for Phone Call        c/o diarrhea, vomiting, chill, HA, legs ache. taking otc. started to give her come care advice & she stated she knew all that & hung up. I called back, told her no appt here open & could use urgent care. she said she was going to the hospital "and make everyone sick" hung up on me again. reported to K. Malen Gauze, RN Follow-up by: Golden Circle RN,  December 31, 2007 9:52 AM

## 2010-05-14 NOTE — Assessment & Plan Note (Signed)
Summary: PNEUMONIA SHOT AND READ PPD WP   Nurse Visit   Vital Signs:  Patient Profile:   65 Years Old Female Temp:     98.5 degrees F  Vitals Entered By: Jacki Cones RN (January 29, 2007 9:58 AM)                 Prior Medications: HYDROCHLOROTHIAZIDE 25 MG  TABS (HYDROCHLOROTHIAZIDE) 1 by mouth once daily ROBITUSSIN DM 100-10 MG/5ML  SYRP (DEXTROMETHORPHAN-GUAIFENESIN) 10 ml by mouth q 4-6 hours as needed cough Disp: QS AMLODIPINE BESYLATE 10 MG  TABS (AMLODIPINE BESYLATE) 1 by mouth qd SIMVASTATIN 40 MG  TABS (SIMVASTATIN) 1 by mouth once daily ACTOPLUS MET 15-500 MG  TABS (PIOGLITAZONE HCL-METFORMIN HCL) 1 tab by mouth two times a day BUPROPION HCL 100 MG  TB12 (BUPROPION HCL) 1 by mouth two times a day CELEBREX 200 MG  CAPS (CELECOXIB) 1 by mouth once daily as needed knee pain Current Allergies: LISINOPRIL (LISINOPRIL)   Pneumovax Vaccine    Vaccine Type: Pneumovax    Site: right deltoid    Mfr: Merck    Dose: 0.5 ml    Route: IM    Given by: AMY MARTIN RN    Exp. Date: 09/03/2008    Lot #: [id number]    VIS given: 11/10/95 version given January 29, 2007.  PPD Results    Date of reading: 01/29/2007    Results: < 5mm    Interpretation: negative   Orders Added: 1)  Pneumococcal Vaccine [90732] 2)  Admin 1st Vaccine [90471] 3)  Est Level 1- [facility] [47829]    ]

## 2010-05-14 NOTE — Assessment & Plan Note (Signed)
Summary: f/u eo   Vital Signs:  Patient profile:   65 year old female Weight:      176.4 pounds Temp:     98.4 degrees F oral Pulse rate:   71 / minute Pulse rhythm:   regular BP sitting:   131 / 81  (left arm) Cuff size:   large  Vitals Entered By: Loralee Pacas CMA (March 28, 2009 10:36 AM)  Primary Care Provider:  Denny Levy MD   History of Present Illness: f/u back and hip pain. Her hip is much better. We switched her from the vicodin to the tylenol#3 as she thought the vicodin was too strong---but then she discovered the tylenol#3 was not helping at all so now she wants to go back to vicodin.  back pain is diffusely in lumbar area and she is having recurrent mid back pain esp ion right side. No new injury. No SOB, no nausea. No leg weakness or numbness.  Allergies: 1)  Lisinopril (Lisinopril)  Physical Exam  General:  alert and well-developed.   Msk:  trigger point tenderness mid thoracic back, lateral muescles on right. Worse with lateral rotation. Diffusely tender to palpation B lumbar area. Additional Exam:  reviewed her x rays of thoracic spine. some mild to mod degenerative change. mild scoliosis.   Impression & Recommendations:  Problem # 1:  ARTHRITIS, BACK (ICD-721.90)  Orders: Physical Therapy Referral (PT) [facility]- Est  Level 4 (99214) msk back pain--I suspect after her knee surgery she may have changed her gait and this has aggravated her back. willset up for PT. Switch her back to vicodin. f/u 4-6 w  Complete Medication List: 1)  Hydrochlorothiazide 25 Mg Tabs (Hydrochlorothiazide) .Marland Kitchen.. 1 by mouth once daily 2)  Amlodipine Besylate 10 Mg Tabs (Amlodipine besylate) .Marland Kitchen.. 1 by mouth qd 3)  Simvastatin 40 Mg Tabs (Simvastatin) .Marland Kitchen.. 1 by mouth once daily 4)  Potassium Chloride 20 Meq Pack (Potassium chloride) .... 2 by mouth once daily 5)  Anacin 81 Mg Tbec (Aspirin) .... Once daily 6)  Metformin Hcl 850 Mg Tabs (Metformin hcl) .Marland Kitchen.. 1 by mouth once daily to  replace actos met 7)  Clonidine Hcl 0.2 Mg Tabs (Clonidine hcl) .Marland Kitchen.. 1 by mouth qhs 8)  Ibuprofen 800 Mg Tabs (Ibuprofen) .Marland Kitchen.. 1 by mouth two times a day or three times a day as needed pain 9)  Trazodone Hcl 50 Mg Tabs (Trazodone hcl) .... 2 by mouth qhs 10)  Vitamin B 12 Injection  .Marland Kitchen.. 1000 micrograms q moth im 11)  Ultrameter Strips  .... Disp box checks qd 12)  Lancets  .... Use as directed testing once daily disp box 13)  Oxybutynin Chloride 5 Mg Tabs (Oxybutynin chloride) .Marland Kitchen.. 1 by mouth qd 14)  Vicodin 5-500 Mg Tabs (Hydrocodone-acetaminophen) .Marland Kitchen.. 1-2 by mouth at bedtime as needed back pain Prescriptions: VICODIN 5-500 MG TABS (HYDROCODONE-ACETAMINOPHEN) 1-2 by mouth at bedtime as needed back pain  #60 x 5   Entered and Authorized by:   Denny Levy MD   Signed by:   Denny Levy MD on 03/28/2009   Method used:   Telephoned to ...       [facility]  [address] [phone]* (retail)       [address]       [place], [place]  [address]       Ph: [phone] or [phone]       Fax: [phone]   RxID:   [id number]

## 2010-05-14 NOTE — Letter (Signed)
Summary: LAB Letter  [facility]  [address]   [place], [place] [address]   Phone: [phone]  Fax: [phone]    12/02/2007  [facility] [address] [place], [place]  [address]  Dear Ms. Brune,   Your hemoglobin is normal at 13.2. Your thyroid test, kidney and liver function, electrolytes and glucosea are all in normal range. There is nothing here that would give [place] a clue as to why you are so fatigued.        Sincerely,   Denny Levy MD [facility]  Appended Document: LAB Letter sent

## 2010-05-14 NOTE — Progress Notes (Signed)
Phone Note Outgoing Call       DEAR WHITE TEAM please call her (cell is [phone]) and tell her there is NO compression fracture in her back. She has some (long standing) scoliosis that is very mild and a little arthritis. Ihope the tylenol #3 will help. If she is not doing better with her pain in 2 weeks, have her rtc. Thanks!  Denny Levy MD  February 14, 2009 2:08 PM    above message given to patient.  Patient requests a bone density scan. will send message to MD. Theresia Lo RN  February 14, 2009 2:23 PM Ok I have ordered if you can set up Thanks!  Denny Levy MD  February 14, 2009 2:51 PM   appointment scheduled 02/20/2009 at [facility]. message left on patient voicemail to return call. also need to know if patient has ever had a Dexa Scan. Theresia Lo RN  February 14, 2009 4:08 PM    patient calls back and information given about appointment. she had never had a Dexa Scan. Theresia Lo RN  February 14, 2009 4:08 PM   Complete Medication List: 1)  Hydrochlorothiazide 25 Mg Tabs (Hydrochlorothiazide) .Marland Kitchen.. 1 by mouth once daily 2)  Amlodipine Besylate 10 Mg Tabs (Amlodipine besylate) .Marland Kitchen.. 1 by mouth qd 3)  Simvastatin 40 Mg Tabs (Simvastatin) .Marland Kitchen.. 1 by mouth once daily 4)  Potassium Chloride 20 Meq Pack (Potassium chloride) .... 2 by mouth once daily 5)  Anacin 81 Mg Tbec (Aspirin) .... Once daily 6)  Metformin Hcl 850 Mg Tabs (Metformin hcl) .Marland Kitchen.. 1 by mouth once daily to replace actos met 7)  Clonidine Hcl 0.2 Mg Tabs (Clonidine hcl) .Marland Kitchen.. 1 by mouth qhs 8)  Ibuprofen 800 Mg Tabs (Ibuprofen) .Marland Kitchen.. 1 by mouth two times a day or three times a day as needed pain 9)  Trazodone Hcl 50 Mg Tabs (Trazodone hcl) .... 2 by mouth qhs 10)  Vitamin B 12 Injection  .Marland Kitchen.. 1000 micrograms q moth im 11)  Ultrameter Strips  .... Disp box checks qd 12)  Lancets  .... Use as directed testing once daily disp box 13)  Oxybutynin Chloride 5 Mg Tabs (Oxybutynin chloride) .Marland Kitchen.. 1 by mouth  qd 14)  Vicodin 5-500 Mg Tabs (Hydrocodone-acetaminophen) .Marland Kitchen.. 1-2 by mouth at bedtime as needed low back pain 15)  Tylenol With Codeine #3 300-30 Mg Tabs (Acetaminophen-codeine) .Marland Kitchen.. 1-2 by mouth q 6-8 hrs as needed back pain    Prevention & Chronic Care Immunizations   Influenza vaccine: Fluvax Non-MCR  (01/24/2009)   Influenza vaccine due: 01/04/2009    Tetanus booster: 08/13/2003: Done.   Tetanus booster due: 08/12/2013    Pneumococcal vaccine: Pneumovax  (01/29/2007)   Pneumococcal vaccine due: None    H. zoster vaccine: Not documented  Colorectal Screening   Hemoccult: had colonoscopy not indicated  (04/11/2008)   Hemoccult due: Not Indicated    Colonoscopy: Done.  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 in 1-2 years dry eyes--rx restasis and artificial tears Dr [facility] [phone]  (04/05/2008)   Eye exam due: 04/05/2009    Foot exam: Not documented   Foot exam action/deferral: Do today   High risk foot: Not documented   Foot care education: Not documented    Urine microalbumin/creatinine ratio: Not documented   Urine microalbumin action/deferral: Ordered  Lipids   Total Cholesterol: 155  (04/13/2008)   LDL: 72  (04/13/2008)   LDL Direct: 103  (01/24/2009)   HDL: 36  (04/13/2008)   Triglycerides: 235  (04/13/2008)   Lipid panel due: 03/02/2009    SGOT (AST): 16  (01/24/2009)   SGPT (ALT): 11  (01/24/2009)   Alkaline phosphatase: 69   (01/24/2009)   Total bilirubin: 0.5  (01/24/2009)   Liver panel due: 03/02/2009  Hypertension   Last Blood Pressure: 104 / 73  (01/24/2009)   Serum creatinine: 1.00  (01/24/2009)   Serum potassium 3.6  (01/24/2009)  Self-Management Support :   Personal Goals (by the next clinic visit) :     Personal A1C goal: 7  (01/24/2009)     Personal blood pressure goal: 130/80  (01/24/2009)     Personal LDL goal: 100  (01/24/2009)    Diabetes self-management support: Not documented    Diabetes self-management support not done because: Good outcomes  (01/24/2009)    Hypertension self-management support: Not documented    Hypertension self-management support not done because: Good outcomes  (01/24/2009)    Lipid self-management support: Not documented     Lipid self-management support not done because: Good outcomes  (01/24/2009)   Nursing Instructions: Schedule screening DXA bone density scan (see order)

## 2010-05-14 NOTE — Progress Notes (Signed)
Summary: Rx  Medications Added * ULTRAMETER STRIPS disp box checks qd * LANCETS use as directed testing once daily disp box       Phone Note Refill Request Call back at Home Phone [phone]   [facility] [address], FORGOT TO ASK AT APPT EARLIER TODAY  Initial call taken by: Haydee Salter,  January 05, 2008 4:25 PM  Follow-up for Phone Call        does she need a rx for the glucometer (ultrameter) or does she need strips and solution? Let me know and I will call in ALSO plz tell her her vitamin B 12 is low and she should come in for a nurse visit to get a b 12 shot Thanks!  [name] NEAL MD  January 06, 2008 9:59 AM   Additional Follow-up for Phone Call Additional follow up Details #1::        Left message on voicemail for patient to call back Additional Follow-up by: ASHA BENTON LPN,  January 06, 2008 2:53 PM    Additional Follow-up for Phone Call Additional follow up Details #2::    returning call Follow-up by: Haydee Salter,  January 06, 2008 3:49 PM  Additional Follow-up for Phone Call Additional follow up Details #3:: Details for Additional Follow-up Action Taken: Patient states she need strips and lancets for ultrameter, also she says she will be in monday for B12 shot Additional Follow-up by: ASHA BENTON LPN,  January 06, 2008 4:03 PM  New/Updated Medications: * ULTRAMETER STRIPS disp box checks qd * LANCETS use as directed testing once daily disp box   Prescriptions: LANCETS use as directed testing once daily disp box  #1 x 12   Entered and Authorized by:   Denny Levy MD   Signed by:   Denny Levy MD on 01/07/2008   Method used:   Printed then faxed to ...       [facility]  [address] [id number]       [address]       [place], [place]  [address]       Ph: [phone] or [phone]       Fax: [phone]   RxID:   [id number] ULTRAMETER STRIPS disp box checks qd  #1 x 12   Entered and Authorized by:    Denny Levy MD   Signed by:   Denny Levy MD on 01/07/2008   Method used:   Printed then faxed to ...       [facility]  [address] [id number]       [address]       [place], [place]  [address]       Ph: [phone] or [phone]       Fax: [phone]   RxID:   [id number] LANCETS use as directed testing once daily disp box  #1 x 12   Entered by:   Denny Levy MD   Authorized by:   Marland Kitchen [facility]   Signed by:   Denny Levy MD on 01/07/2008   Method used:   Printed then faxed to ...       [facility]  [address] [id number]       [address]       [place], [place]  [address]       Ph: [phone] or [phone]  Fax: [phone]   RxID:   [id number] HYQMVHQION STRIPS disp box checks qd  #1 x 12   Entered by:   Denny Levy MD   Authorized by:   Marland Kitchen [facility]   Signed by:   Denny Levy MD on 01/07/2008   Method used:   Printed then faxed to ...       [facility]  [address] [id number]       [address]       [place], [place]  [address]       Ph: [phone] or [phone]       Fax: [phone]   RxID:   [id number]

## 2010-05-14 NOTE — Progress Notes (Signed)
Summary: Refill   Phone Note Call from Patient Call back at [facility] Phone [phone]   Summary of Call: Pt states she called [facility] pharmacy to get wellbutrin (generic) filled there on 3/2 and they haven't received the refill approval from our office- [facility] [address]. Initial call taken by: Haydee Salter,  June 18, 2007 9:49 AM  Follow-up for Phone Call        well that is because I have not heard from [facility] I will send refill electronically now and you can tell h her it is on way  ...................................................................[name] NEAL MD  June 18, 2007 11:23 AM   Additional Follow-up for Phone Call Additional follow up Details #1::        LMOVM, notifying pt. Additional Follow-up by: ASHA BENTON LPN,  June 18, 2007 12:05 PM      Prescriptions: BUPROPION HCL 100 MG  TB12 (BUPROPION HCL) 1 by mouth two times a day  #60 x 12   Entered and Authorized by:   Denny Levy MD   Signed by:   Denny Levy MD on 06/18/2007   Method used:   Electronically sent to ...       [facility]       [address]       [place], [place]  [address]       Ph: [phone]       Fax: [phone]   RxID:   [id number]

## 2010-05-14 NOTE — Assessment & Plan Note (Signed)
Summary: bladder prob, df   Vital Signs:  Patient profile:   65 year old female Weight:      195.6 pounds Temp:     98.0 degrees F oral Pulse rate:   70 / minute BP sitting:   106 / 73  (left arm)  Vitals Entered By: Alphia Kava (June 28, 2008 9:05 AM) Last LDL:  72 (04/13/2008 8:50:00 PM) LDL Result Date:  04/13/2008 LDL Result:  72 LDL Next Due:  1 yr  Is Patient Diabetic? Yes   History of Present Illness: recurrent episoes of urinary frequency w burning. Some bladder pain. No back or flank opain, no nausea or vomiting. No fever  Also needs me to fill out a medical clearance form for her upcoming TKR.  also wants to try going off wellbutrin--the trazodone really seemed to help her more--she increased the dose to two times a day on her own. Denies SI/HI. She is more calm on trazodone, finds it easier to focus. Still some fatigue.  Habits & Providers     Tobacco Status: never  Allergies: 1)  Lisinopril (Lisinopril)  Physical Exam  General:  alert, well-developed, and well-hydrated.   Mouth:  pharynx pink and moist.   Neck:  supple, full ROM, no masses, no thyromegaly, and normal carotid upstroke.   Lungs:  normal respiratory effort, normal breath sounds, and no wheezes.   Heart:  normal rate, regular rhythm, and no murmur.   Abdomen:  soft, non-tender, and normal bowel sounds.   Msk:  decreased extension lacking 10 degrees for full extension riht knee. Medial joint line tenderness. no effusion no erythema Neurologic:  alert & oriented X3 and sensation intact to light touch.  antalgic gait secondary to knee pain Psych:  Oriented X3, memory intact for recent and remote, good eye contact, not anxious appearing, and not depressed appearing.     Impression & Recommendations:  Problem # 1:  DYSURIA (ICD-788.1)  Her updated medication list for this problem includes:    Cipro 250 Mg Tabs (Ciprofloxacin hcl) .Marland Kitchen... 1 by mouth id for 7 days  Orders: Urinalysis-[facility]  (00000) [facility]- Est  Level 4 (11914)  Problem # 2:  OSTEOARTHRITIS, KNEE (ICD-715.96)  Her updated medication list for this problem includes:    Anacin 81 Mg Tbec (Aspirin) ..... Once daily    Ibuprofen 800 Mg Tabs (Ibuprofen) .Marland Kitchen... 1 by mouth two times a day or three times a day as needed pain  Orders: [facility]- Est  Level 4 (99214)  Problem # 3:  DEPRESSION, MAJOR, RECURRENT (ICD-296.30)  will do a trial of stopping wellbutrin. We discussed--it may be the combo of the wellbutrin and the trazodone which helped her rtc 1 m  Orders: [facility]- Est  Level 4 (78295)  Complete Medication List: 1)  Hydrochlorothiazide 25 Mg Tabs (Hydrochlorothiazide) .Marland Kitchen.. 1 by mouth once daily 2)  Amlodipine Besylate 10 Mg Tabs (Amlodipine besylate) .Marland Kitchen.. 1 by mouth qd 3)  Simvastatin 40 Mg Tabs (Simvastatin) .Marland Kitchen.. 1 by mouth once daily 4)  Potassium Chloride 20 Meq Pack (Potassium chloride) .... 2 by mouth once daily 5)  Anacin 81 Mg Tbec (Aspirin) .... Once daily 6)  Metformin Hcl 850 Mg Tabs (Metformin hcl) .Marland Kitchen.. 1 by mouth once daily to replace actos met 7)  Clonidine Hcl 0.2 Mg Tabs (Clonidine hcl) .Marland Kitchen.. 1 by mouth qhs 8)  Ibuprofen 800 Mg Tabs (Ibuprofen) .Marland Kitchen.. 1 by mouth two times a day or three times a day as needed pain 9)  Trazodone  Hcl 50 Mg Tabs (Trazodone hcl) .... 2 by mouth qhs 10)  Vitamin B 12 Injection  .Marland Kitchen.. 1000 micrograms q moth im 11)  Ultrameter Strips  .... Disp box checks qd 12)  Lancets  .... Use as directed testing once daily disp box 13)  Cipro 250 Mg Tabs (Ciprofloxacin hcl) .Marland Kitchen.. 1 by mouth id for 7 days  Patient Instructions: 1)  I have called in cipro to treat your urinary burning. 2)  I have also called in a new rx for trazodone so you can increase to 2 tabs at night. We will let  you taper off the wellbutrin by taking it every other day for a week and then stoppin. 3)  I will fax your medical clearance for your upcoming knee surgery 4)  Please schedule a follow-up appointment in 2  months.  5)  The medication list was reviewed and reconciled.  All changed / newly prescribed medications were explained.  A complete medication list was provided to the patient / caregiver. Prescriptions: TRAZODONE HCL 50 MG TABS (TRAZODONE HCL) 2 by mouth qhs  #60 x 5   Entered and Authorized by:   Denny Levy MD   Signed by:   Denny Levy MD on 06/28/2008   Method used:   [facility]  [address] [id number]       [address]       [place], [place]  [address]       Ph: [phone] or [phone]       Fax: [phone]   RxID:   [id number] CIPRO 250 MG TABS (CIPROFLOXACIN HCL) 1 by mouth id for 7 days  #14 x 0   Entered and Authorized by:   Denny Levy MD   Signed by:   Denny Levy MD on 06/28/2008   Method used:   [facility]        [facility]  [address] [id number]       [address]       [place], [place]  [address]       Ph: [phone] or [phone]       Fax: [phone]   RxID:   [id number]    Laboratory Results   Urine Tests  Date/Time Received: June 28, 2008 9:16 AM  Date/Time Reported: June 28, 2008 9:53 AM   Routine Urinalysis   Color: yellow Appearance: Clear Glucose: negative   (Normal Range: Negative) Bilirubin: small;   ictotest = negative   (Normal Range: Negative) Ketone: trace (5)   (Normal Range: Negative) Spec. Gravity: >=1.030   (Normal Range: 1.003-1.035) Blood: trace-lysed   (Normal Range: Negative) pH: 5.5   (Normal Range: 5.0-8.0) Protein: 30   (Normal Range: Negative) Urobilinogen: 0.2   (Normal Range: 0-1) Nitrite: negative   (Normal Range: Negative) Leukocyte Esterace: negative   (Normal Range: Negative)  Urine Microscopic WBC/HPF: 1-3 RBC/HPF: occ Bacteria/HPF: 1+ Mucous/HPF: 3+ Epithelial/HPF: 5-15 Crystals/HPF: few calcium oxalate    Comments: ...............test performed by......Marland KitchenBonnie A. [place], [place] (ASCP)      Appended Document:  bladder prob, df completed pre op clearance--had NM cardiacw/u inlast 18 months, neg ekg today. otherwise stable. scann copy to chart

## 2010-05-14 NOTE — Miscellaneous (Signed)
  Clinical Lists Changes  Observations: Added new observation of CREATININE: 1.1 mg/dL (09/98/3382 50:53)

## 2010-05-14 NOTE — Progress Notes (Signed)
Summary: refill  Phone Note Refill Request Call back at [phone] Message from:  Patient  Refills Requested: Medication #1:  ALPRAZOLAM 0.5 MG TABS 1 tab by mouth two times a day as needed anxiety. needs it to help her sleep  Initial call taken by: De Nurse,  October 03, 2009 8:35 AM Caller: Patient Initial call taken by: De Nurse,  October 03, 2009 8:33 AM  Follow-up for Phone Call        DWT I do not want her on this medicine on a regular basis. Please tell  her to try some Benadryl OTC for sleep iinstead. Alprazolam is NOT a good medicine for her to get used to.  Denny Levy MD  October 03, 2009 2:28 PM   Additional Follow-up for Phone Call Additional follow up Details #1::        left message to return call Additional Follow-up by: Gladstone Pih,  October 03, 2009 4:51 PM    Additional Follow-up for Phone Call Additional follow up Details #2::    Spoke with pt, explained note from dr Jennette Kettle, stated she can not take benadrly it makes her "hyper", stated she would cont without med as she has done for the last few nights. will call back and sched apt with PCP if she feels she needs something else. Told dr Jennette Kettle,  Follow-up by: Gladstone Pih,  October 04, 2009 8:50 AM

## 2010-05-14 NOTE — Assessment & Plan Note (Signed)
Summary: kidney infection ? wp   Vital Signs:  Patient Profile:   65 Years Old Female Height:     63.5 inches (161.29 cm) Weight:      198.3 pounds Temp:     98.5 degrees F Pulse rate:   72 / minute BP sitting:   120 / 66  (right arm)  Pt. in pain?   no  Vitals Entered By: Jacki Cones RN (April 24, 2008 9:54 AM)                  PCP:  SARA NEAL MD  Chief Complaint:  burning and pressure with urination and lower back pain.  History of Present Illness: ?UTI:  1 week history of burning and pressure with urination.  no increase in frequency - goes often with diabetes anyway but has noticed not able to get as much out at a time as usual and dribbling. also having urgency.  denies fevers or chills but has had back pain R>L.  states this feels like previous urine infections.  medications and past medical history reviewed with patient and no changes required except as noted.     Current Allergies: LISINOPRIL (LISINOPRIL)     Review of Systems      See HPI   Physical Exam  General:     alert, well-developed, well-nourished, and overweight-appearing.   Abdomen:     soft, nondistended.  suprapubic tenderness to palpation.  R CVA tenderness.    Impression & Recommendations:  Problem # 1:  UTI (ICD-599.0) Assessment: New UA and sxs consistent with UTI and perhaps even pyelonephritis given CVA tenderness.  will treat with prolonged course given these findings.  return for fevers, chills.   Her updated medication list for this problem includes:    Cephalexin 500 Mg Caps (Cephalexin) .Marland Kitchen... 1 by mouth two times a day for 14 days  Orders: [facility]- Est Level  3 (81191)   Complete Medication List: 1)  Hydrochlorothiazide 25 Mg Tabs (Hydrochlorothiazide) .Marland Kitchen.. 1 by mouth once daily 2)  Amlodipine Besylate 10 Mg Tabs (Amlodipine besylate) .Marland Kitchen.. 1 by mouth qd 3)  Simvastatin 40 Mg Tabs (Simvastatin) .Marland Kitchen.. 1 by mouth once daily 4)  Bupropion Hcl 100 Mg Tb12 (Bupropion hcl) .Marland Kitchen..  1 by mouth two times a day 5)  Potassium Chloride 20 Meq Pack (Potassium chloride) .... 2 by mouth once daily 6)  Anacin 81 Mg Tbec (Aspirin) .... Once daily 7)  Metformin Hcl 850 Mg Tabs (Metformin hcl) .Marland Kitchen.. 1 by mouth once daily to replace actos met 8)  Clonidine Hcl 0.2 Mg Tabs (Clonidine hcl) .Marland Kitchen.. 1 by mouth qhs 9)  Ibuprofen 800 Mg Tabs (Ibuprofen) .Marland Kitchen.. 1 by mouth two times a day or three times a day as needed pain 10)  Trazodone Hcl 50 Mg Tabs (Trazodone hcl) .... 1/2 -1 by mouth 1 hour before bed time as needed insomnia 11)  Vitamin B 12 Injection  .Marland Kitchen.. 1000 micrograms q moth im 12)  Ultrameter Strips  .... Disp box checks qd 13)  Lancets  .... Use as directed testing once daily disp box 14)  Cephalexin 500 Mg Caps (Cephalexin) .Marland Kitchen.. 1 by mouth two times a day for 14 days  Other Orders: Urinalysis-[facility] (00000)   Patient Instructions: 1)  If you develop fevers, chills you need to be evaluated again.  2)  Be sure to take the complete course of your antibiotics even if you start to feel better quickly. 3)  Follow up as instructed with  your regular doctor.   Prescriptions: CEPHALEXIN 500 MG CAPS (CEPHALEXIN) 1 by mouth two times a day for 14 days  #28 x 0   Entered and Authorized by:   Ancil Boozer  MD   Signed by:   Ancil Boozer  MD on 04/24/2008   Method used:   Print then Give to Patient   RxID:   [id number]   Laboratory Results   Urine Tests  Date/Time Received: April 24, 2008 10:06 AM  Date/Time Reported: April 24, 2008 10:21 AM   Routine Urinalysis   Color: yellow Appearance: Clear Glucose: negative   (Normal Range: Negative) Bilirubin: small-icto negative   (Normal Range: Negative) Ketone: small (15)   (Normal Range: Negative) Spec. Gravity: 1.025   (Normal Range: 1.003-1.035) Blood: small   (Normal Range: Negative) pH: 6.0   (Normal Range: 5.0-8.0) Protein: 100   (Normal Range: Negative) Urobilinogen: 1.0   (Normal Range: 0-1) Nitrite: positive    (Normal Range: Negative) Leukocyte Esterace: moderate   (Normal Range: Negative)  Urine Microscopic WBC/HPF: 20+ RBC/HPF: 0-3 Bacteria/HPF: 3+ Mucous/HPF: 2+ Epithelial/HPF: 5-8    Comments: ...........test performed by...........Marland KitchenTerese Door, CMA

## 2010-05-14 NOTE — Assessment & Plan Note (Signed)
Summary: f/u,df   Vital Signs:  Patient profile:   65 year old female Weight:      180 pounds Temp:     98.4 degrees F oral Pulse rate:   72 / minute Pulse rhythm:   regular BP sitting:   118 / 74  (right arm) Cuff size:   regular  Vitals Entered By: Loralee Pacas CMA (January 16, 2010 8:46 AM) Is Patient Diabetic? Yes Did you bring your meter with you today? No   Primary Care Provider:  Denny Levy MD   History of Present Illness: 1) tremor: having increased problems withtremor--especially first thing in am--mouth tremor is much worse. No loss of balance, no loss of finger movements (fine motor control) that she is aware o. No change in speech. No dizziness.  2) left shoulder pain--keeps her from doing many things. Pain is in posterior shoulder, worse at night and with reaching motions. No numbness, no weakness.  3) left knee pain--aching 8/10. Worse with stairs or walking.  Habits & Providers  Alcohol-Tobacco-Diet     Tobacco Status: never  Current Medications (verified): 1)  Hydrochlorothiazide 25 Mg  Tabs (Hydrochlorothiazide) .Marland Kitchen.. 1 By Mouth Once Daily 2)  Amlodipine Besylate 10 Mg  Tabs (Amlodipine Besylate) .Marland Kitchen.. 1 By Mouth Qd 3)  Simvastatin 40 Mg  Tabs (Simvastatin) .Marland Kitchen.. 1 By Mouth Once Daily 4)  Klor-Con M20 20 Meq Cr-Tabs (Potassium Chloride Crys Cr) .... 2 By Mouth Qd 5)  Anacin 81 Mg  Tbec (Aspirin) .... Once Daily 6)  Metformin Hcl 850 Mg  Tabs (Metformin Hcl) .Marland Kitchen.. 1 By Mouth Once Daily To Replace Actos Met 7)  Clonidine Hcl 0.2 Mg Tabs (Clonidine Hcl) .Marland Kitchen.. 1 By Mouth Qhs 8)  Ibuprofen 800 Mg Tabs (Ibuprofen) .Marland Kitchen.. 1 By Mouth Two Times A Day or Three Times A Day As Needed Pain 9)  Trazodone Hcl 50 Mg Tabs (Trazodone Hcl) .... 2 By Mouth Qhs 10)  Vitamin B 12 Injection .Marland Kitchen.. 1000 Micrograms Q Moth Im 11)  Ultrameter Strips .... Disp Box Checks Qd 12)  Lancets .... Use As Directed Testing Once Daily Disp Box 13)  Vicodin 5-500 Mg Tabs (Hydrocodone-Acetaminophen)  .Marland Kitchen.. 1-2 By Mouth At Bedtime As Needed Back Pain 14)  Flexeril 5 Mg Tabs (Cyclobenzaprine Hcl) .Marland Kitchen.. 1 By Mouth At Bedtime For 5 Nights As Needed Neck and Back Pain  Allergies: 1)  Lisinopril (Lisinopril)  Review of Systems  The patient denies anorexia, fever, weight loss, weight gain, vision loss, decreased hearing, hoarseness, syncope, dyspnea on exertion, peripheral edema, and headaches.   Neuro:  Complains of poor balance and tremors; denies difficulty with concentration, falling down, memory loss, numbness, sensation of room spinning, and visual disturbances.  Physical Exam  General:  alert and well-developed.   Eyes:  pupils equal, pupils round, and pupils reactive to light.  EOMI Mouth:  orofacial movements at rest Neck:  supple, full ROM, and no masses.   Lungs:  normal respiratory effort and normal breath sounds.   Heart:  normal rate, regular rhythm, and no murmur.   Abdomen:  soft, non-tender, and normal bowel sounds.   Msk:  left shoulder pain with forward reach--site of pain is in trapezius muscle and some into left neck. TTP alongtrap and this recreates her paion.  left knee TTP aling both joint lines. Synovial thickening with some external deformity. calf is soft. Full extension. ligaentously intact+ crepitus. Additional Exam:  Patient given informed consent for injection. Discussed possible complications of infection, bleeding  or skin atrophy at site of injection. Possible side effect of avascular necrosis (focal area of bone death) due to steroid use.Appropriate verbal time out taken Are cleaned and prepped in usual sterile fashion. A ----1 cc kennalog plus -4---cc 1% lidocaine without epinephrine was injected into thleft knee. Patient tolerated procedure well with no complications.  left trapezius was prepped strile. 3 separate injections made into the left trapezuis at points of maximal pain and spasm. a total of 1 1/2 cc of lidocaine and 1/2 cc kennalog 40 were used.  Patient tolerated the procedure well.    Impression & Recommendations:  Problem # 1:  TREMOR (ICD-781.0)  Orders: [facility]- Est  Level 4 ([address]) referral to neurology. She wants to wait uyntil December as she will have medicare as of Dec 1.  Problem # 2:  ARTHRITIS, BACK (ICD-721.90)  Orders: [facility]- Est  Level 4 (60454) her left trapezius spasm is related  to her back OA. I recommended PT and she also wants to wait until Dec to do that. Will schedule. Trigger point injection today.  Problem # 3:  OSTEOARTHRITIS, KNEE (ICD-715.96)  Orders: Injection, large joint- [facility] (20610) injection likely she will need TKR on this knee soon.  Complete Medication List: 1)  Hydrochlorothiazide 25 Mg Tabs (Hydrochlorothiazide) .Marland Kitchen.. 1 by mouth once daily 2)  Amlodipine Besylate 10 Mg Tabs (Amlodipine besylate) .Marland Kitchen.. 1 by mouth qd 3)  Simvastatin 40 Mg Tabs (Simvastatin) .Marland Kitchen.. 1 by mouth once daily 4)  Klor-con M20 20 Meq Cr-tabs (Potassium chloride crys cr) .... 2 by mouth qd 5)  Anacin 81 Mg Tbec (Aspirin) .... Once daily 6)  Metformin Hcl 850 Mg Tabs (Metformin hcl) .Marland Kitchen.. 1 by mouth once daily to replace actos met 7)  Clonidine Hcl 0.2 Mg Tabs (Clonidine hcl) .Marland Kitchen.. 1 by mouth qhs 8)  Ibuprofen 800 Mg Tabs (Ibuprofen) .Marland Kitchen.. 1 by mouth two times a day or three times a day as needed pain 9)  Trazodone Hcl 50 Mg Tabs (Trazodone hcl) .... 2 by mouth qhs 10)  Vitamin B 12 Injection  .Marland Kitchen.. 1000 micrograms q moth im 11)  Ultrameter Strips  .... Disp box checks qd 12)  Lancets  .... Use as directed testing once daily disp box 13)  Vicodin 5-500 Mg Tabs (Hydrocodone-acetaminophen) .Marland Kitchen.. 1-2 by mouth at bedtime as needed back pain 14)  Flexeril 5 Mg Tabs (Cyclobenzaprine hcl) .Marland Kitchen.. 1 by mouth at bedtime for 5 nights as needed neck and back pain  Other Orders: Influenza Vaccine NON MCR (09811) A1C-[facility] (91478) Comp Met-[facility] (29562-13086) CBC-[facility] (57846) TSH-[facility] (96295-28413) Vit B12 1000 mcg  (K4401)   Immunizations Administered:  Influenza Vaccine # 1:    Vaccine Type: Fluvax Non-MCR    Site: right deltoid    Mfr: [employer]    Dose: 0.5 ml    Route: IM    Given by: Loralee Pacas CMA    Exp. Date: 10/09/2010    Lot #: [id number]    VIS given: 11/06/09 version given January 16, 2010.  Flu Vaccine Consent Questions:    Do you have a history of severe allergic reactions to this vaccine? no    Any prior history of allergic reactions to egg and/or gelatin? no    Do you have a sensitivity to the preservative Thimersol? no    Do you have a past history of Guillan-Barre Syndrome? no    Do you currently have an acute febrile illness? no    Have you ever had a severe  reaction to latex? no    Vaccine information given and explained to patient? yes    Are you currently pregnant? no  Laboratory Results   Blood Tests   Date/Time Received: January 16, 2010 9:45 AM  Date/Time Reported: January 16, 2010 10:39 AM   HGBA1C: 5.6%   (Normal Range: Non-Diabetic - 3-6%   Control Diabetic - 6-8%)  Comments: ...............test performed by......Marland KitchenBonnie A. [place], MLS (ASCP)cm      Medication Administration  Injection # 1:    Medication: Vit B12 1000 mcg    Diagnosis: FATIGUE (ICD-780.79)    Route: IM    Site: R deltoid    Exp Date: 09/12/2011    Lot #: [id number]    Mfr: [employer]    Patient tolerated injection without complications    Given by: Loralee Pacas CMA (January 16, 2010 3:22 PM)  Orders Added: 1)  Influenza Vaccine NON MCR [00028] 2)  A1C-[facility] [83036] 3)  Comp Met-[facility] [80053-22900] 4)  CBC-[facility] [85027] 5)  TSH-[facility] [45409-81191] 6)  Vit B12 1000 mcg [J3420] 7)  [facility]- Est  Level 4 [47829] 8)  Injection, large joint- [facility] [20610]

## 2010-05-14 NOTE — Progress Notes (Signed)
Summary: refill   Phone Note Call from Patient Call back at Home Phone [phone]   Caller: Patient Summary of Call: needs new rx b/c she is going back to drug store instead of mail order.  - [facility] Amlodipine Clonidine Ultra meter strips lancets Simvastatin Initial call taken by: De Nurse,  October 26, 2008 8:39 AM    Prescriptions: CLONIDINE HCL 0.2 MG TABS (CLONIDINE HCL) 1 by mouth qhs  #30 x 3   Entered by:   Golden Circle RN   Authorized by:   Denny Levy MD   Signed by:   Golden Circle RN on 10/26/2008   Method used:   [facility]        [facility] [address] [phone]* (retail)       [address]       [place], [place]  [address]       Ph: [phone] or [phone]       Fax: [phone]   RxID:   [id number] ANACIN 81 MG  TBEC (ASPIRIN) once daily  #30 x 3   Entered by:   Golden Circle RN   Authorized by:   Denny Levy MD   Signed by:   Golden Circle RN on 10/26/2008   Method used:   Electronically to        [facility] [address] [phone]* (retail)       [address]       [place], [place]  [address]       Ph: [phone] or [phone]       Fax: [phone]   RxID:   [id number] SIMVASTATIN 40 MG  TABS (SIMVASTATIN) 1 by mouth once daily  #30 Tablet x 3   Entered by:   Golden Circle RN   Authorized by:   Denny Levy MD   Signed by:   Golden Circle RN on 10/26/2008   Method used:   Electronically to        [facility] [address] [phone]* (retail)       [address]       [place], [place]  [address]       Ph: [phone] or [phone]       Fax: [phone]   RxID:   [id number] AMLODIPINE BESYLATE 10 MG  TABS (AMLODIPINE BESYLATE) 1 by mouth qd  #30 Tablet x 3   Entered by:   Golden Circle RN   Authorized by:   Denny Levy MD   Signed by:   Golden Circle RN on 10/26/2008   Method used:   Electronically to        [facility] [address] [phone]*  (retail)       [address]       [place], [place]  [address]       Ph: [phone] or [phone]       Fax: [phone]   RxID:   [id number] HYDROCHLOROTHIAZIDE 25 MG  TABS (HYDROCHLOROTHIAZIDE) 1 by mouth once daily  #31 Tablet x 3   Entered by:   Golden Circle RN   Authorized by:   Denny Levy MD   Signed by:   Golden Circle RN on 10/26/2008   Method used:   Electronically to        [facility] [address] [phone]* (retail)  [address]       [place], [place]  [address]       Ph: [phone] or [phone]       Fax: [phone]   RxID:   [id number]  called in the lancets & strips as they would not go electronically...Marland KitchenMarland KitchenGolden Circle RN  October 26, 2008 8:46 AM

## 2010-05-14 NOTE — Progress Notes (Signed)
Summary: Triage   Phone Note Call from Patient Call back at [facility] Phone [phone]   Summary of Call: Pt just found a tick on her face and would like to discuss with a rn. Initial call taken by: Haydee Salter,  October 07, 2007 10:12 AM  Follow-up for Phone Call        removed a tick on r side of face under her cheek. advised her to mark the date on her calender & observe over the next month for a bulls eye rask. call if that occurs, red, swollen or fever. verbalized understanding Follow-up by: Golden Circle RN,  October 07, 2007 10:12 AM

## 2010-05-14 NOTE — Miscellaneous (Signed)
Summary: Rfl req from [facility]  Medications Added AMLODIPINE BESYLATE 10 MG  TABS (AMLODIPINE BESYLATE) 1 by mouth qd       Clinical Lists Changes Date:12/11/06 09:20 EDT From:   [id number] [address] [place],  [address] [phone] (Voice) [phone] (Fax)   To: RB Patient:  (#[id number])  (DOB: 04-Jul-1945) LOV: none NOV: none.  Renew amlodipine Tablet 10 mg [Requested As: AMLODIPINE BESYLATE 10 MG TMYL] TAKE 1 TABLET BY MOUTH EVERY DAY Disp. 30 tablet  (Requested: 4) (Last Fill: 10/20/2006) Medications: Added new medication of AMLODIPINE BESYLATE 10 MG  TABS (AMLODIPINE BESYLATE) 1 by mouth qd - Signed Rx of AMLODIPINE BESYLATE 10 MG  TABS (AMLODIPINE BESYLATE) 1 by mouth qd;  #30 x 12;  Signed;  Entered by: Denny Levy MD;  Authorized by: Denny Levy MD;  Method used: Electronic    Prescriptions: AMLODIPINE BESYLATE 10 MG  TABS (AMLODIPINE BESYLATE) 1 by mouth qd  #30 x 12   Entered and Authorized by:   Denny Levy MD   Signed by:   Denny Levy MD on 12/17/2006   Method used:   Electronically sent to ...       [id number] [address]       [address]       [place], [place]  [address]       Ph: [phone] or [phone]       Fax: [phone]   RxID:   [id number]

## 2010-05-14 NOTE — Miscellaneous (Signed)
  Medications Added POTASSIUM CHLORIDE 20 MEQ  PACK (POTASSIUM CHLORIDE) 2 by mouth once daily       Clinical Lists Changes  Medications: Changed medication from POTASSIUM CHLORIDE 20 MEQ  PACK (POTASSIUM CHLORIDE) 1 by mouth qd to POTASSIUM CHLORIDE 20 MEQ  PACK (POTASSIUM CHLORIDE) 2 by mouth once daily - Signed Rx of POTASSIUM CHLORIDE 20 MEQ  PACK (POTASSIUM CHLORIDE) 2 by mouth once daily;  #60 x 12;  Signed;  Entered by: Denny Levy MD;  Authorized by: Denny Levy MD;  Method used: [facility]  [address] [id number] [address] [place], [place], [place]  [address], Ph: [phone] or [phone], Fax: [phone]    Prescriptions: POTASSIUM CHLORIDE 20 MEQ  PACK (POTASSIUM CHLORIDE) 2 by mouth once daily  #60 x 12   Entered and Authorized by:   Denny Levy MD   Signed by:   Denny Levy MD on 04/19/2008   Method used:   Electronically to        [facility]  [address] [id number]       [address]       [place], [place]  [address]       Ph: [phone] or [phone]       Fax: [phone]   RxID:   [id number]

## 2010-05-14 NOTE — Progress Notes (Signed)
Summary: REFILL   Phone Note Call from Patient Call back at Home Phone [phone]   Caller: Patient Summary of Call: METFORMIN LOST OR STOLEN PT UNSURE OF WHICH BUT LIKE METFORMIN [facility]ONE [facility] [address] Initial call taken by: Dedra Skeens CMA,,  December 27, 2007 11:02 AM  Follow-up for Phone Call        Called pt pharmacy and gave them the ok to refil metformin early. patient informed Follow-up by: ASHA BENTON LPN,  December 27, 2007 11:14 AM

## 2010-05-14 NOTE — Progress Notes (Signed)
Summary: refill  Phone Note Call from Patient Call back at [phone]   Caller: Patient Summary of Call: pt needs VICODIN 5-500 MG TABS she had no refill - [facility] Initial call taken by: De Nurse,  December 18, 2009 2:15 PM    Prescriptions: VICODIN 5-500 MG TABS (HYDROCODONE-ACETAMINOPHEN) 1-2 by mouth at bedtime as needed back pain  #60 x 5   Entered and Authorized by:   Denny Levy MD   Signed by:   Denny Levy MD on 12/18/2009   Method used:   Telephoned to ...       [facility] [address] [phone] (retail)       [address]       [place], [place]  [address]       Ph: [phone] or [phone]       Fax: [phone]   RxID:   [id number]   DEAR WHITE TEAM please call in as above  Called in.  Attempted to notify pt but no answer.  Left a VMM.   Dennison Nancy RN  December 18, 2009 4:46 PM

## 2010-05-14 NOTE — Progress Notes (Signed)
Summary: CARDIOLOGY APPT    SCHED. APPT WITH DR.[facility] FOR 08-17-06 AT 11:45AM. FAXED REFERRAL. CALLED PT AND LMAM WITH APPT INFO ...................................................................THEKLA SLADE CMA,  August 07, 2006 4:42 PM

## 2010-05-14 NOTE — Progress Notes (Signed)
Summary: Triage   Phone Note Call from Patient Call back at [phone]   Caller: Daughter-Michelle Christell Constant Summary of Call: confused about her medicines because she had knee surgery last Monday taking pain meds and needs to get her meds straight so her daughter can give them to her.  She was taking Ambien and Trazodone from Dr. Jennette Kettle.  She does not know whether to take this too.   Initial call taken by: Clydell Hakim,  Aug 14, 2008 9:22 AM  Follow-up for Phone Call        has not been able to sleep since she got home from hosp Saturday. trazadone not on list from hospital, so dtr did not give it to her. does not have any ambien.   pain level is still high & pt is taking as soon as it is time again. using walker. does not like the machine. uses much less than she is to use. [facility] nurse will be there later today.  dtr concerned about pain level & pt's inability to sleep. message to pcp Follow-up by: Golden Circle RN,  Aug 14, 2008 9:42 AM  Additional Follow-up for Phone Call Additional follow up Details #1::        Kennon Rounds she should call ortho re her pain--they just did surgery. The trazodone was on her list before she went in hospital so unless they STOPPED it --she should continue it Thanks!  Denny Levy MD  Aug 14, 2008 9:59 AM     Additional Follow-up for Phone Call Additional follow up Details #2::    dtr already has a call into the surgeon's office asking for more effective pain meds. she will ask about the ambien & trazadone. she has trazadone available but not ambien Follow-up by: Golden Circle RN,  Aug 14, 2008 10:03 AM

## 2010-05-14 NOTE — Assessment & Plan Note (Signed)
Summary: leg pain/eo   Vital Signs:  Patient Profile:   65 Years Old Female Height:     63.5 inches (161.29 cm) Weight:      212.1 pounds BMI:     37.12 Temp:     97.8 degrees F oral Pulse rate:   86 / minute BP sitting:   132 / 80  (left arm) Cuff size:   large  Pt. in pain?   yes    Location:   knees and legs    Intensity:   8  Vitals Entered By: Garen Grams LPN (October 06, 2007 10:21 AM)                  Procedure Note Last Tetanus: Done. (08/13/2003)  Injections:    Comment: discussed risks and pt agreed to procedure.  sterilized with betadine.  injected 1cc kenalog 40, 4 cc marcaine.  cold spray for anesthesia.  tolderated well.  minimal bleeding.   PCP:  Denny Levy MD  Chief Complaint:  leg pain with swelling and unable to sleep.  History of Present Illness: 65 yo F with bilateral knee pain  1. OA - >15 years. worse lately.  has had multiple injections.  aware of risks of infection, bleeding, fat pad atrophy.  last injfection in dec.  can't afford to see orthopedist.  DM under good control but wants HbA1c rechecked b/c her blood sugars have been higher since switched meds recentyl.      Current Allergies: LISINOPRIL (LISINOPRIL)        Impression & Recommendations:  Problem # 1:  OSTEOARTHRITIS, KNEE (ICD-715.96) Assessment: Deteriorated injected bilateral knee. Her updated medication list for this problem includes:    Anacin 81 Mg Tbec (Aspirin) ..... Once daily    Ibuprofen 800 Mg Tabs (Ibuprofen) .Marland Kitchen... 1 by mouth two times a day or three times a day as needed pain  Orders: Injection, large joint- [facility] ([address]10) Injection, large joint- [facility] (206[address])   Problem # 2:  DIABETES MELLITUS, TYPE II (ICD-250.00) Assessment: Unchanged A1C still excellent.  will f/u with dr. Jennette Kettle as previously discussed. Her updated medication list for this problem includes:    Anacin 81 Mg Tbec (Aspirin) ..... Once daily    Metformin Hcl 850 Mg Tabs (Metformin hcl) .Marland Kitchen...  1 by mouth once daily to replace actos met  Orders: A1C-[facility] ([address])   Complete Medication List: 1)  Hydrochlorothiazide 25 Mg Tabs (Hydrochlorothiazide) .Marland Kitchen.. 1 by mouth once daily 2)  Amlodipine Besylate 10 Mg Tabs (Amlodipine besylate) .Marland Kitchen.. 1 by mouth qd 3)  Simvastatin 40 Mg Tabs (Simvastatin) .Marland Kitchen.. 1 by mouth once daily 4)  Bupropion Hcl 100 Mg Tb12 (Bupropion hcl) .Marland Kitchen.. 1 by mouth two times a day 5)  Potassium Chloride 20 Meq Pack (Potassium chloride) .Marland Kitchen.. 1 by mouth qd 6)  Anacin 81 Mg Tbec (Aspirin) .... Once daily 7)  Metformin Hcl 850 Mg Tabs (Metformin hcl) .Marland Kitchen.. 1 by mouth once daily to replace actos met 8)  Clonidine Hcl 0.2 Mg Tabs (Clonidine hcl) .Marland Kitchen.. 1 by mouth twice a day 9)  Ibuprofen 800 Mg Tabs (Ibuprofen) .Marland Kitchen.. 1 by mouth two times a day or three times a day as needed pain 10)  Ambien 10 Mg Tabs (Zolpidem tartrate) .Marland Kitchen.. 1 by mouth qhs    Prescriptions: AMBIEN 10 MG  TABS (ZOLPIDEM TARTRATE) 1 by mouth qhs  #15 x 0   Entered and Authorized by:   Rolm Gala MD   Signed by:   Rolm Gala  MD on 10/06/2007   Method used:   Print then Give to Patient   RxID:   [id number]  ] Laboratory Results   Blood Tests   Date/Time Received: October 06, 2007 10:47  AM  Date/Time Reported: October 06, 2007 11:02 AM   HGBA1C: 5.9 %   (Normal Range: Non-Diabetic - 3-6%   Control Diabetic - 6-8%)  Comments: ............test performed by...........Marland Kitchen Terese Door, CMA .............entered by...........Marland KitchenBonnie A. [place], [place] (ASCP)

## 2010-05-14 NOTE — Miscellaneous (Signed)
Summary: Rfl Req Dr Tiajuana Amass  Medications Added SIMVASTATIN 40 MG  TABS (SIMVASTATIN) 1 by mouth once daily       Clinical Lists Changes  Date:12/25/06 08:39 EDT From:  [id number] [address] [place], [place] [address] [phone] (Voice) [phone] (Fax)    To: RB  Patient:  (#[id number])  (DOB: 04-07-1946)  LOV: none NOV: none.   Renew simvastatin Tablet 40 mg [Requested As: SIMVASTATIN 40 MG TABLET TEV] TAKE 1 AT BEDTIME Disp. 31 tablet  (Requested: 6) (Last Fill: 11/10/2006)     Medications: Added new medication of SIMVASTATIN 40 MG  TABS (SIMVASTATIN) 1 by mouth once daily - Signed Rx of SIMVASTATIN 40 MG  TABS (SIMVASTATIN) 1 by mouth once daily;  #30 x 12;  Signed;  Entered by: Denny Levy MD;  Authorized by: Denny Levy MD;  Method used: Electronic    Prescriptions: SIMVASTATIN 40 MG  TABS (SIMVASTATIN) 1 by mouth once daily  #30 x 12   Entered and Authorized by:   Denny Levy MD   Signed by:   Denny Levy MD on 01/04/2007   Method used:   Electronically sent to ...       [id number] [address]       [address]       [place], [place]  [address]       Ph: [phone] or [phone]       Fax: [phone]   RxID:   [id number]

## 2010-05-14 NOTE — Letter (Signed)
Summary: [facility]  [address]   [place], [place] [address]   Phone: [phone]  Fax: [phone]    09/22/2008  [facility] [address] [place], [place]  [address]  Dear Ms. Plaut,   All of your labs were totally normal. I suspect your fatigue is related to the big surgery you just had. You should gradually get your energy back in teh next 4 weeks or so and I should probably wee you back in about a month to make sure everything is caught up.        Sincerely,   Denny Levy MD  Appended Document: LABLetter mailed

## 2010-05-14 NOTE — Assessment & Plan Note (Signed)
Summary: bp/dm/eo   Vital Signs:  Patient profile:   65 year old female Height:      63.5 inches Weight:      173.8 pounds BMI:     30.41 Temp:     98.2 degrees F oral Pulse rate:   84 / minute BP sitting:   122 / 72  (left arm) Cuff size:   large  Vitals Entered By: Gladstone Pih (Aug 29, 2009 8:40 AM) CC: F/U DM and HTN Is Patient Diabetic? Yes Did you bring your meter with you today? No Pain Assessment Patient in pain? no        Primary Care Provider:  Denny Levy MD  CC:  F/U DM and HTN.  History of Present Illness: 1) f/u tremor--much better since she stopped phenergan and oxybutinen, Still occasional hand tremor but less and the lip movements have stopped  2) Knee pain--she knows itis a little early for a repeat steroid shot but is going in a 'camping trip' with her family and would like to get shot so she can do more activities and not "feel so clumsy". Shot helped a lot when she had it 2 m ago. Would also like refill on her vicodin but inly wants #30 becuase she does not want to take too much and says if she is 'allowed" 2 pills at night she usually takes 2.  3) fatigue was much better with the B 12 shot. Due again she thinks 4) Diabetes follow up with blood sugars in    good control,   no episodes of low blood sugar. Taking medicines regularly and having no problems with them.   Habits & Providers  Alcohol-Tobacco-Diet     Tobacco Status: never  Current Medications (verified): 1)  Hydrochlorothiazide 25 Mg  Tabs (Hydrochlorothiazide) .Marland Kitchen.. 1 By Mouth Once Daily 2)  Amlodipine Besylate 10 Mg  Tabs (Amlodipine Besylate) .Marland Kitchen.. 1 By Mouth Qd 3)  Simvastatin 40 Mg  Tabs (Simvastatin) .Marland Kitchen.. 1 By Mouth Once Daily 4)  Klor-Con M20 20 Meq Cr-Tabs (Potassium Chloride Crys Cr) .... 2 By Mouth Qd 5)  Anacin 81 Mg  Tbec (Aspirin) .... Once Daily 6)  Metformin Hcl 850 Mg  Tabs (Metformin Hcl) .Marland Kitchen.. 1 By Mouth Once Daily To Replace Actos Met 7)  Clonidine Hcl 0.2 Mg Tabs  (Clonidine Hcl) .Marland Kitchen.. 1 By Mouth Qhs 8)  Ibuprofen 800 Mg Tabs (Ibuprofen) .Marland Kitchen.. 1 By Mouth Two Times A Day or Three Times A Day As Needed Pain 9)  Trazodone Hcl 50 Mg Tabs (Trazodone Hcl) .... 2 By Mouth Qhs 10)  Vitamin B 12 Injection .Marland Kitchen.. 1000 Micrograms Q Moth Im 11)  Ultrameter Strips .... Disp Box Checks Qd 12)  Lancets .... Use As Directed Testing Once Daily Disp Box 13)  Vicodin 5-500 Mg Tabs (Hydrocodone-Acetaminophen) .Marland Kitchen.. 1by Mouth At Bedtime As Needed Back Pain  Allergies: 1)  Lisinopril (Lisinopril)  Review of Systems  The patient denies anorexia, fever, weight gain, dyspnea on exertion, peripheral edema, prolonged cough, and hemoptysis.    Physical Exam  General:  alert and well-developed.   Lungs:  normal respiratory effort and normal breath sounds.   Heart:  normal rate, no murmur, no gallop, and no rub.   Msk:  L Knee no effusion or erythema, positive medial joint line tenderness. Popliteal space and calf normal. Lacks full extension by 5 degrees. Has full flexion. Neurologic:  slight intenrion tremor but no facial movements and no resting tremor noted. Additional Exam:  Patient  given informed consent for injection. Discussed possible complications of infection, bleeding or skin atrophy at site of injection. Possible side effect of avascular necrosis (focal area of bone death) due to steroid use.Appropriate verbal time out taken Are cleaned and prepped in usual sterile fashion. A ---1- cc kennalog plus --4--cc 1% lidocaine without epinephrine was injected into the-left knee using anterior approach--. Patient tolerated procedure well with no complications.    Impression & Recommendations:  Problem # 1:  TREMOR (ICD-781.0)  Orders: [facility]- Est  Level 4 ([address]24) better off oxybutinena and phenergan. Will avoid in future  Problem # 2:  ARTHRITIS, BACK (ICD-721.90)  Orders: [facility]- Est  Level 4 (40102) continue low dose vicodin  Problem # 3:  DIABETES MELLITUS, TYPE II  (ICD-250.00)  Her updated medication list for this problem includes:    Anacin 81 Mg Tbec (Aspirin) ..... Once daily    Metformin Hcl 850 Mg Tabs (Metformin hcl) .Marland Kitchen... 1 by mouth once daily to replace actos met  Orders: [facility]- Est  Level 4 ([address]36) doing well  Problem # 4:  FATIGUE, ACUTE (ICD-780.79)  Orders: Admin of Therapeutic Inj  intramuscular or subcutaneous (64403) Vit B12 1000 mcg (J3420)  Complete Medication List: 1)  Hydrochlorothiazide 25 Mg Tabs (Hydrochlorothiazide) .Marland Kitchen.. 1 by mouth once daily 2)  Amlodipine Besylate 10 Mg Tabs (Amlodipine besylate) .Marland Kitchen.. 1 by mouth qd 3)  Simvastatin 40 Mg Tabs (Simvastatin) .Marland Kitchen.. 1 by mouth once daily 4)  Klor-con M20 20 Meq Cr-tabs (Potassium chloride crys cr) .... 2 by mouth qd 5)  Anacin 81 Mg Tbec (Aspirin) .... Once daily 6)  Metformin Hcl 850 Mg Tabs (Metformin hcl) .Marland Kitchen.. 1 by mouth once daily to replace actos met 7)  Clonidine Hcl 0.2 Mg Tabs (Clonidine hcl) .Marland Kitchen.. 1 by mouth qhs 8)  Ibuprofen 800 Mg Tabs (Ibuprofen) .Marland Kitchen.. 1 by mouth two times a day or three times a day as needed pain 9)  Trazodone Hcl 50 Mg Tabs (Trazodone hcl) .... 2 by mouth qhs 10)  Vitamin B 12 Injection  .Marland Kitchen.. 1000 micrograms q moth im 11)  Ultrameter Strips  .... Disp box checks qd 12)  Lancets  .... Use as directed testing once daily disp box 13)  Vicodin 5-500 Mg Tabs (Hydrocodone-acetaminophen) .Marland Kitchen.. 1by mouth at bedtime as needed back pain  Other Orders: Injection, large joint- [facility] ([address])  Patient Instructions: 1)  Please schedule a follow-up appointment in 3 months .  Prescriptions: VICODIN 5-500 MG TABS (HYDROCODONE-ACETAMINOPHEN) 1by mouth at bedtime as needed back pain  #30 x 5   Entered and Authorized by:   Denny Levy MD   Signed by:   Denny Levy MD on 08/29/2009   Method used:   Print then Give to Patient   RxID:   [id number]    Medication Administration  Injection # 1:    Medication: Vit B12 1000 mcg    Diagnosis: FATIGUE, ACUTE  (ICD-780.79)    Route: IM    Site: L deltoid    Exp Date: 12/2010    Lot #: [id number]    Mfr: [employer]    Comments: per Dr Jennette Kettle    Patient tolerated injection without complications    Given by: Gladstone Pih (Aug 29, 2009 9:09 AM)  Orders Added: 1)  Admin of Therapeutic Inj  intramuscular or subcutaneous [96372] 2)  Vit B12 1000 mcg [J3420] 3)  [facility]- Est  Level 4 [99214] 4)  Injection, large joint- [facility] [[address]]   Prevention & Chronic Care  Immunizations   Influenza vaccine: Fluvax Non-MCR  (01/24/2009)   Influenza vaccine due: 01/04/2009    Tetanus booster: 08/13/2003: Done.   Tetanus booster due: 08/12/2013    Pneumococcal vaccine: Pneumovax  (01/29/2007)   Pneumococcal vaccine due: None    H. zoster vaccine: Not documented  Colorectal Screening   Hemoccult: had colonoscopy not indicated  (04/11/2008)   Hemoccult due: Not Indicated    Colonoscopy: Done.  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 dry eyes--rx restasis and artificial tears Dr [facility] [phone]  (04/05/2008)   Eye exam due: 04/05/2009    Foot exam: Not documented   Foot exam action/deferral: Do today   High risk foot: Not documented   Foot care education: Not documented    Urine microalbumin/creatinine ratio: Not documented   Urine microalbumin action/deferral: Ordered    Diabetes flowsheet reviewed?: Yes   Progress toward A1C goal: At goal  Lipids   Total  Cholesterol: 155  (04/13/2008)   LDL: 72  (04/13/2008)   LDL Direct: 103  (01/24/2009)   HDL: 36  (04/13/2008)   Triglycerides: 235  (04/13/2008)   Lipid panel due: 03/02/2009    SGOT (AST): 19  (07/11/2009)   SGPT (ALT): 15  (07/11/2009)   Alkaline phosphatase: 71  (07/11/2009)   Total bilirubin: 0.4  (07/11/2009)   Liver panel due: 03/02/2009  Hypertension   Last Blood Pressure: 122 / 72  (08/29/2009)   Serum creatinine: 0.92  (07/11/2009)   Serum potassium 3.5  (07/11/2009)    Hypertension flowsheet reviewed?: Yes   Progress toward BP goal: At goal  Self-Management Support :   Personal Goals (by the next clinic visit) :     Personal A1C goal: 7  (01/24/2009)     Personal blood pressure goal: 130/80  (01/24/2009)     Personal LDL goal: 100  (01/24/2009)    Diabetes self-management support: Not documented    Diabetes self-management support not done because: Good outcomes  (01/24/2009)    Hypertension self-management support: Not documented    Hypertension self-management support not done because: Good outcomes  (02/14/2009)    Lipid self-management support: Not documented     Lipid self-management support not done because: Good outcomes  (02/14/2009)

## 2010-05-14 NOTE — Miscellaneous (Signed)
  Medications Added HYDROCHLOROTHIAZIDE 25 MG  TABS (HYDROCHLOROTHIAZIDE) 1 by mouth once daily       Clinical Lists Changes Date:11/15/06 14:58 EDT From:  To: RB Patient:  (#[id number])  (DOB: 1946/01/26) LOV: none NOV: none.  Renew hydrochlorothiazide Tablet 25 mg [Requested As: HYDROCHLOROTHIAZIDE 25 MG TQUA] TAKE 1 TABLET BY MOUTH EVERY DAY Disp. 30 tablet  (Requested: 7) (Last Fill: 09/28/2006)   Medications: Added new medication of HYDROCHLOROTHIAZIDE 25 MG  TABS (HYDROCHLOROTHIAZIDE) 1 by mouth once daily - Signed Rx of HYDROCHLOROTHIAZIDE 25 MG  TABS (HYDROCHLOROTHIAZIDE) 1 by mouth once daily;  #31 x 12;  Signed;  Entered by: Denny Levy MD;  Authorized by: Denny Levy MD;  Method used: Electronic    Prescriptions: HYDROCHLOROTHIAZIDE 25 MG  TABS (HYDROCHLOROTHIAZIDE) 1 by mouth once daily  #31 x 12   Entered and Authorized by:   Denny Levy MD   Signed by:   Denny Levy MD on 11/17/2006   Method used:   [facility] ...       [facility] [address]       [address]       [place], [place]  [address]       Ph: [phone] or [phone]       Fax: [phone]   RxID:   [id number]

## 2010-05-14 NOTE — Assessment & Plan Note (Signed)
Summary: back pain,df   Vital Signs:  Patient profile:   65 year old female Height:      63.5 inches Weight:      182 pounds BMI:     31.85 Temp:     98.0 degrees F oral Pulse rate:   73 / minute BP sitting:   133 / 80  (right arm)  Vitals Entered By: Terese Door (February 14, 2009 8:39 AM) CC: back pain Is Patient Diabetic? Yes Pain Assessment Patient in pain? yes     Location: back Intensity: 8 Type: sharp   Primary Care Provider:  Denny Levy MD  CC:  back pain.  History of Present Illness: f/u low back and hip pain---we gave her a shot last time and that really seemed to help. the vicodin was "too strong" and made her sleepy.  Now she is having pain in her mid back that is very sharp, boring, very localized. No known trauma. Never had pain here before. Been going on several days to almiost a week--not improved with decreased activity. Did not want to take any more vicodin for this pain as it made her sleepy.. No nausea or SOB.  f/u shoulder pain which is significanlt  (99%) imoproved after injection tehrapy.  Habits & Providers  Alcohol-Tobacco-Diet     Tobacco Status: never  Current Medications (verified): 1)  Hydrochlorothiazide 25 Mg  Tabs (Hydrochlorothiazide) .Marland Kitchen.. 1 By Mouth Once Daily 2)  Amlodipine Besylate 10 Mg  Tabs (Amlodipine Besylate) .Marland Kitchen.. 1 By Mouth Qd 3)  Simvastatin 40 Mg  Tabs (Simvastatin) .Marland Kitchen.. 1 By Mouth Once Daily 4)  Potassium Chloride 20 Meq  Pack (Potassium Chloride) .... 2 By Mouth Once Daily 5)  Anacin 81 Mg  Tbec (Aspirin) .... Once Daily 6)  Metformin Hcl 850 Mg  Tabs (Metformin Hcl) .Marland Kitchen.. 1 By Mouth Once Daily To Replace Actos Met 7)  Clonidine Hcl 0.2 Mg Tabs (Clonidine Hcl) .Marland Kitchen.. 1 By Mouth Qhs 8)  Ibuprofen 800 Mg Tabs (Ibuprofen) .Marland Kitchen.. 1 By Mouth Two Times A Day or Three Times A Day As Needed Pain 9)  Trazodone Hcl 50 Mg Tabs (Trazodone Hcl) .... 2 By Mouth Qhs 10)  Vitamin B 12 Injection .Marland Kitchen.. 1000 Micrograms Q Moth Im 11)   Ultrameter Strips .... Disp Box Checks Qd 12)  Lancets .... Use As Directed Testing Once Daily Disp Box 13)  Oxybutynin Chloride 5 Mg Tabs (Oxybutynin Chloride) .Marland Kitchen.. 1 By Mouth Qd 14)  Vicodin 5-500 Mg Tabs (Hydrocodone-Acetaminophen) .Marland Kitchen.. 1-2 By Mouth At Bedtime As Needed Low Back Pain 15)  Tylenol With Codeine #3 300-30 Mg Tabs (Acetaminophen-Codeine) .Marland Kitchen.. 1-2 By Mouth Q 6-8 Hrs As Needed Back Pain  Allergies: 1)  Lisinopril (Lisinopril)  Physical Exam  Neck:  full ROM.   Lungs:  normal breath sounds and no wheezes.   Msk:  Area that she points to for pain is about T 10-12. It is tender to palpation in the muscle around the vertebra.  Normal ROm of thoracic spine. No skin changes, no deformity noted. Can flex forward without much increase in pain but has some increase with hyperextension backward.   Impression & Recommendations:  Problem # 1:  BACK PAIN, THORACIC REGION (ICD-724.1)  Orders: Radiology other (Radiology Other) [facility]- Est  Level 4 (25956) will check film to rule out comprwession fx (it was negative) try tylenol #3 rather than vicodin for pain rtc if not impproving over next 2 weeks or if this worsens.  Complete Medication List:  1)  Hydrochlorothiazide 25 Mg Tabs (Hydrochlorothiazide) .Marland Kitchen.. 1 by mouth once daily 2)  Amlodipine Besylate 10 Mg Tabs (Amlodipine besylate) .Marland Kitchen.. 1 by mouth qd 3)  Simvastatin 40 Mg Tabs (Simvastatin) .Marland Kitchen.. 1 by mouth once daily 4)  Potassium Chloride 20 Meq Pack (Potassium chloride) .... 2 by mouth once daily 5)  Anacin 81 Mg Tbec (Aspirin) .... Once daily 6)  Metformin Hcl 850 Mg Tabs (Metformin hcl) .Marland Kitchen.. 1 by mouth once daily to replace actos met 7)  Clonidine Hcl 0.2 Mg Tabs (Clonidine hcl) .Marland Kitchen.. 1 by mouth qhs 8)  Ibuprofen 800 Mg Tabs (Ibuprofen) .Marland Kitchen.. 1 by mouth two times a day or three times a day as needed pain 9)  Trazodone Hcl 50 Mg Tabs (Trazodone hcl) .... 2 by mouth qhs 10)  Vitamin B 12 Injection  .Marland Kitchen.. 1000 micrograms q moth  im 11)  Ultrameter Strips  .... Disp box checks qd 12)  Lancets  .... Use as directed testing once daily disp box 13)  Oxybutynin Chloride 5 Mg Tabs (Oxybutynin chloride) .Marland Kitchen.. 1 by mouth qd 14)  Vicodin 5-500 Mg Tabs (Hydrocodone-acetaminophen) .Marland Kitchen.. 1-2 by mouth at bedtime as needed low back pain 15)  Tylenol With Codeine #3 300-30 Mg Tabs (Acetaminophen-codeine) .Marland Kitchen.. 1-2 by mouth q 6-8 hrs as needed back pain Prescriptions: TYLENOL WITH CODEINE #3 300-30 MG TABS (ACETAMINOPHEN-CODEINE) 1-2 by mouth q 6-8 hrs as needed back pain  #120 x 0   Entered and Authorized by:   Denny Levy MD   Signed by:   Denny Levy MD on 02/14/2009   Method used:   Print then Give to Patient   RxID:   [id number]   Prevention & Chronic Care Immunizations   Influenza vaccine: Fluvax Non-MCR  (01/24/2009)   Influenza vaccine due: 01/04/2009    Tetanus booster: 08/13/2003: Done.   Tetanus booster due: 08/12/2013    Pneumococcal vaccine: Pneumovax  (01/29/2007)   Pneumococcal vaccine due: None    H. zoster vaccine: Not documented  Colorectal Screening   Hemoccult: had colonoscopy not indicated  (04/11/2008)   Hemoccult due: Not Indicated    Colonoscopy: Done.  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 in 1-2 years dry eyes--rx restasis and artificial tears Dr [facility] [phone]  (04/05/2008)   Eye exam due: 04/05/2009    Foot exam: Not documented   Foot exam action/deferral: Do today   High risk foot: Not documented   Foot care  education: Not documented    Urine microalbumin/creatinine ratio: Not documented   Urine microalbumin action/deferral: Ordered    Diabetes flowsheet reviewed?: Yes   Progress toward A1C goal: At goal  Lipids   Total Cholesterol: 155  (04/13/2008)   LDL: 72  (04/13/2008)   LDL Direct: 103  (01/24/2009)   HDL: 36  (04/13/2008)   Triglycerides: 235  (04/13/2008)   Lipid panel due: 03/02/2009    SGOT (AST): 16  (01/24/2009)   SGPT (ALT): 11  (01/24/2009)   Alkaline phosphatase: 69  (01/24/2009)   Total bilirubin:  0.5  (01/24/2009)   Liver panel due: 03/02/2009    Lipid flowsheet reviewed?: Yes   Progress toward LDL goal: At goal  Hypertension   Last Blood Pressure: 133 / 80  (02/14/2009)   Serum creatinine: 1.00  (01/24/2009)   Serum potassium 3.6  (01/24/2009)    Hypertension flowsheet reviewed?: Yes   Progress toward BP goal: At goal  Self-Management Support :   Personal Goals (by the next clinic visit) :     Personal A1C goal: 7  (01/24/2009)     Personal blood pressure goal: 130/80  (01/24/2009)     Personal LDL goal: 100  (01/24/2009)    Diabetes self-management support: Not documented    Diabetes self-management support not done because: Good outcomes  (01/24/2009)    Hypertension self-management support: Not documented    Hypertension self-management support not done because: Good outcomes  (02/14/2009)    Lipid self-management support: Not documented     Lipid self-management support not done because: Good outcomes  (02/14/2009)

## 2010-05-14 NOTE — Progress Notes (Signed)
Summary: [place] request   Phone Note Call from Patient Call back at Home Phone [phone]   Summary of Call: pt is coming in for possible bronchitis - has been coughing all week with chest pain Initial call taken by: Haydee Salter,  November 19, 2006 8:33 AM  Follow-up for Phone Call        pt will see dr. Corliss Marcus at 9:30am Follow-up by: Golden Circle RN,  November 19, 2006 8:44 AM

## 2010-05-14 NOTE — Progress Notes (Signed)
Summary: Rx Prob  Phone Note Call from Patient Call back at [phone]   Caller: Patient Summary of Call: Pt says the rx sent in yesterday did not get to the pharmacy per pharmacy. Initial call taken by: Clydell Hakim,  September 21, 2009 8:37 AM  Follow-up for Phone Call        rx called to pharmacy as they have not received. patient notified. Follow-up by: Theresia Lo RN,  September 21, 2009 9:58 AM

## 2010-05-14 NOTE — Progress Notes (Signed)
Summary: Triage   Phone Note Call from Patient Call back at Home Phone [phone]   Reason for Call: Talk to Nurse Summary of Call: is c/o cold symptoms, wants to be worked in today. (its her bday today!!) Initial call taken by: Haydee Salter,  March 29, 2008 8:35 AM  Follow-up for Phone Call        started to sched appt for her. she said her son was sick as well & she wanted appt for him at same time. told her we did not have 2 back to back appts & there may be a wait between the 2 appts. she stated "I'll go to Urgent Care" and hung up. Follow-up by: Golden Circle RN,  March 29, 2008 8:51 AM

## 2010-05-14 NOTE — Letter (Signed)
Summary: Probation Letter  [facility]  [address]   [place], [place] [address]   Phone: [phone]  Fax: [phone]    01/25/2009  [facility] [address] [place], [place]  [address]  Dear [name],  With the goal of better serving all our patients the [facility] is following each patient's missed appointments.  You have missed at least 2 appointments with our practice.If you cannot keep your appointment, we expect you to call at least 24 hours before your appointment time.  Missing appointments prevents other patients from seeing [place] and makes it difficult to provide you with the best possible medical care.      1.   If you miss one more appointment, we will only give you limited medical services. This means we will not call in medication refills, complete a form, or make a referral for you except when you are here for a scheduled office visit.    2.   If you miss 2 or more appointments in the next year, we will dismiss you from our practice.    Our office staff can be reached at [phone] Monday through Friday from 8:30 a.m.-5:00 p.m. and will be glad to schedule your appointment as necessary.    Thank you.   [facility]

## 2010-05-14 NOTE — Letter (Signed)
Summary: PAP Letter  [facility]  [address]   [place], [place] [address]   Phone: [phone]  Fax: [phone]    03/24/2008  [facility] [address] [place], [place]  [address]  Dear Ms. Maddy,    Your pap smear showed no sign of cervical cancer.        Sincerely,   Denny Levy MD [facility]  Appended Document: PAP Letter sent

## 2010-05-14 NOTE — Progress Notes (Signed)
   Phone Note Outgoing Call   Summary of Call: DEAREST RED TEAM: [facility] wants a prior authorization for her buproprion 100 mg tabs, 1 by mouth two times a day. can you call their prior auth # and see what they need? 1 [id number]. her case number is evidently [id number]. I know this is ridiculous--trying to think ahead and WHAT ELSE could they want; her dx is depression and she has tried an ssri and had side effects. THIS IS a generic med. THANKS  ...................................................................SARA NEAL MD  June 28, 2007 11:55 AM         " the request for coverage is in process" per [facility].....................................................................[facility] CALDWELL RN  June 28, 2007 3:55 PM Thanks  Appended Document:  recd denial of ins coverage for med

## 2010-05-14 NOTE — Letter (Signed)
Summary: [facility]  [address]   [place], [place] [address]   Phone: [phone]  Fax: [phone]    01/18/2010  [facility] [address] [place], [place]  [address]  Dear Ms. Hefner,       All of your lab work including blood sugar, A1C, kidney and liver function, electrolytes and throid was normal. Great to see you!    Sincerely,   Denny Levy MD  Appended Document: LABLetter mailed.

## 2010-05-14 NOTE — Miscellaneous (Signed)
Summary: prior authorization   Clinical Lists Changes sent PA for bupropion to [facility] CALDWELL RN  Aug 23, 2007 10:13 AM  Appended Document: prior authorization approval for above med rec'd. good thru 08/22/2008

## 2010-05-14 NOTE — Progress Notes (Signed)
Summary: verify rx/DONE...FYI   Phone Note From Pharmacy Call back at [phone]   Caller: Kristy/pharmacist [facility] Summary of Call: sts pt received 6 lortab yesterday from er, just wants to verify that its still ok to fill rx today Initial call taken by: ERIN LEVAN,  January 05, 2007 1:47 PM  Follow-up for Phone Call        called pharmacy and spoke with Laughlin. ok to dispense #20 lortab. dr.moreira is aware that pt received #6 in ed yesterday Follow-up by: Arlyss Repress CMA,,  January 05, 2007 3:24 PM

## 2010-05-14 NOTE — Assessment & Plan Note (Signed)
Summary: fu wp   Vital Signs:  Patient Profile:   65 Years Old Female Height:     63.5 inches (161.29 cm) Weight:      205 pounds Temp:     98.3 degrees F Pulse rate:   71 / minute BP sitting:   128 / 85  Vitals Entered By: Jone Baseman CMA (March 08, 2008 9:58 AM)                 PCP:  Denny Levy MD  Chief Complaint:  f/u.  History of Present Illness: Follow up hypertension. Taking medicines regularly with no problems. Has decreased to one whole tab of clonidine, takes it in am. Not having any any headaches or chest pains.  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 Brandon sleep an average of 4hours a night. Mrs Cullifer is constanltly on alert because he gets up and wanders the house--she has caught him going out the door once or twice and also in the kitchen. She admits she is tired all of the time, but the daytime sleepiness has really gotten to be a problem in last few weeks. She wonders iif itis related to her DM or some of her meds. She does not know her breathing pattern at night or whether she snores loudly,     Current Allergies: LISINOPRIL (LISINOPRIL)     Review of Systems  The patient denies anorexia, fever, chest pain, and syncope.     Physical Exam  General:     alert and well-developed.   Neck:     supple, full ROM, and no masses.   Lungs:     normal respiratory effort.   Heart:     normal rate and regular rhythm.   Neurologic:     alert & oriented X3 and gait normal.   Psych:     Oriented X3, memory intact for recent and remote, normally interactive, good eye contact, not anxious appearing, and not depressed appearing.      Impression & Recommendations:  Problem # 1:  SOMNOLENCE (ICD-780.09) this sounds like itis most likely related to her chronic sleep deprivation, but there could be some  component of sleep apnea as well. Do nott think related to her DM. We will change her clonidine doseing to at night only. set up sleep study and see her back 1 m Orders: [facility]- Est Level  3 ([address])   Complete Medication List: 1)  Hydrochlorothiazide 25 Mg Tabs (Hydrochlorothiazide) .Marland Kitchen.. 1 by mouth once daily 2)  Amlodipine Besylate 10 Mg Tabs (Amlodipine besylate) .Marland Kitchen.. 1 by mouth qd 3)  Simvastatin 40 Mg Tabs (Simvastatin) .Marland Kitchen.. 1 by mouth once daily 4)  Bupropion Hcl 100 Mg Tb12 (Bupropion hcl) .Marland Kitchen.. 1 by mouth two times a day 5)  Potassium Chloride 20 Meq Pack (Potassium chloride) .Marland Kitchen.. 1 by mouth qd 6)  Anacin 81 Mg Tbec (Aspirin) .... Once daily 7)  Metformin Hcl 850 Mg Tabs (Metformin hcl) .Marland Kitchen.. 1 by mouth once daily to replace actos met 8)  Clonidine Hcl 0.2 Mg Tabs (Clonidine hcl) .... 1/2 tab  by mouth twice a day 9)  Ibuprofen 800 Mg Tabs (Ibuprofen) .Marland Kitchen.. 1 by mouth two times a day or three times a day as needed pain 10)  Ambien 10 Mg Tabs (Zolpidem tartrate) .Marland Kitchen.. 1 by mouth qhs 11)  Vitamin B 12  Injection  .Marland Kitchen.. 1000 micrograms q moth im 12)  Ultrameter Strips  .... Disp box checks qd 13)  Lancets  .... Use as directed testing once daily disp box  Other Orders: Future Orders: A1C-[facility] ([address]) ... 04/12/2009 Comp Met-[facility] ([id number]) ... 04/12/2009   Patient Instructions: 1)  come for lab work last week of December 2)  call the day ahead so they can give you a time 3)  You need to have a Pap Smear to prevent cervical cancer.   ]  Appended Document: fu wp Medications Added CLONIDINE HCL 0.2 MG TABS (CLONIDINE HCL) 1 by mouth qhs          Clinical Lists Changes  Medications: Changed medication from CLONIDINE HCL 0.2 MG TABS (CLONIDINE HCL) 1/2 tab  by mouth twice a day to CLONIDINE HCL 0.2 MG TABS (CLONIDINE HCL) 1 by mouth qhs

## 2010-05-14 NOTE — Assessment & Plan Note (Signed)
Summary: leg pain wp    Vital Signs:  Patient Profile:   65 Years Old Female Weight:      212 pounds Temp:     98.3 degrees F Pulse rate:   82 / minute BP sitting:   153 / 72  Pt. in pain?   yes    Location:   legs and back    Intensity:   7/8  Vitals Entered By: Jone Baseman CMA (December 17, 2006 2:18 PM)                  PCP:  [name] NEAL MD  Chief Complaint:  bilateral leg pain and back pain.  History of Present Illness: Severe knee pain, has had bilateral injections in the past, last 10/07.  Knows that she needs to exercise and lost weight.  Works with handicapped adults.  Knees hurt as soon as she stands on them in the morning and all day.  Forthcoming with history of addiction (Xanax), so does not want narcotic.  Plan on TKR in the future when the economy improves such that she does not need to work full time.  Mid back pain, positional.  Worse as the day progresses.  Has had in the past and Dr. Cleophas Dunker referred and she underwent a CT angio of the chest which was negative for AA.  Can lean against something and it gets better.  History of esophageal dilitation, pain is not like that.  Uses omeprozole daily.  Joined a gym and has 6 personal training sessions.  Uses tylenol for pain.  Blood sugars under good control since on meds.  Current Allergies: LISINOPRIL (LISINOPRIL)     Review of Systems  MS      Complains of joint pain and mid back pain.      Denies joint redness, joint swelling, loss of strength, and low back pain.   Physical Exam  General:     alert and overweight-appearing.   Msk:     Boggy arthritic knees, no obvious effusion, non reddened, tender over joint line, no instability. Tender over rhomboid muscles mid thoracic, able to isolate with palpation and felt relief with pressure and massage.    Impression & Recommendations:  Problem # 1:  KNEE PAIN (ICD-719.46) Injected both knees with 20 mg Kenalog and 2 cc Marcaine.  Tolerated  procedure well.  Celebrex samples, daily for 6 days.  Recommended straignt leg raises and weight loss. Orders: [facility]- Est  Level 4 ([address]) Injection, intermediate joint - [facility] ([address]605)   Problem # 2:  MUSCLE STRAIN (ICD-848.9) Involving rhomboids, taught exercises, gave handout on such, would be nice if she could get a massage but cost prohibitive.  Ice as needed. Orders: [facility]- Est  Level 4 ([address])   Complete Medication List: 1)  Hydrochlorothiazide 25 Mg Tabs (Hydrochlorothiazide) .Marland Kitchen.. 1 by mouth once daily 2)  Robitussin Dm 100-10 Mg/66ml Syrp (Dextromethorphan-guaifenesin) .Marland Kitchen.. 10 ml by mouth q 4-6 hours as needed cough disp: qs 3)  Amlodipine Besylate 10 Mg Tabs (Amlodipine besylate) .Marland Kitchen.. 1 by mouth qd   Patient Instructions: 1)  Please schedule an appointment with your primary doctor next week for annual PE, DM, and joint problems.

## 2010-05-14 NOTE — Progress Notes (Signed)
   Phone Note Outgoing Call   Summary of Call: DEAR RED TEAM:  please set up a polysomnogram for her. She needs it done in December Thanks!  Denny Levy MD  March 08, 2008 1:27 PM   Follow-up for Phone Call        [facility] they will contact us with appt date/time, then we will contact patient Follow-up by: ASHA BENTON LPN,  March 14, 2008 10:30 AM

## 2010-05-14 NOTE — Miscellaneous (Signed)
Summary: Pre-Op clearance  Pre-Op clearance   Imported By: De Nurse 08/14/2008 13:18:30  _____________________________________________________________________  External Attachment:    Type:   Image     Comment:   External Document

## 2010-05-14 NOTE — Progress Notes (Signed)
Summary: phn msg   Phone Note Call from Patient Call back at [phone]   Caller: Patient Summary of Call: [facility] has not rec'd fax from Dr Jennette Kettle about her surgery on knee Initial call taken by: De Nurse,  July 05, 2008 11:10 AM    placed in fax box today
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
## 2010-05-14 NOTE — Progress Notes (Signed)
Summary: meds prob   Phone Note Call from Patient Call back at Home Phone [phone]   Caller: Patient Summary of Call: did not get the refill on [facility] antibiotic [facility] [address] Initial call taken by: De Nurse,  September 21, 2008 10:22 AM  Follow-up for Phone Call        left message for her to call back . I called it in to her pharmacy after reviewing the OV notes from yesterday & verifying with Dr. Deirdre Priest Follow-up by: Golden Circle RN,  September 21, 2008 10:25 AM      Appended Document: meds prob [facility] [address] called the cipro there

## 2010-05-14 NOTE — Miscellaneous (Signed)
Summary: DERMATOLOGY DNKA  Clinical Lists Changes appt made w derm for 01/24/2009 for lentigo on face. she DNKA>

## 2010-05-14 NOTE — Letter (Signed)
Summary: LAB Letter  [facility]  [address]   [place], [place] [address]   Phone: [phone]  Fax: [phone]    07/13/2009  [facility] [place] [address] [place], [place]  [address]  Dear Ms. Payes,  Your blood sugar, thyroid, hemoglobin and other labs were normal.         Sincerely,   Denny Levy MD  Appended Document: LAB Letter mailed.

## 2010-05-14 NOTE — Consult Note (Signed)
Summary: Ophthalmology  Ophthalmology   Imported By: Clydell Hakim 01/03/2010 15:50:28  _____________________________________________________________________  External Attachment:    Type:   Image     Comment:   External Document

## 2010-05-14 NOTE — Letter (Signed)
Summary: LAB Letter  [facility]  [address]   [place], [place] [address]   Phone: [phone]  Fax: [phone]    02/01/2007  [facility] [place] [address] [place], [place]  [address]  Dear Ms. Bruun,  Your labs were all good--your liver, kidney functiona and electrolytes were normal. Your blood sugar was Ok at 125 and your cholsterol panel is great--numbers below. I would not make any changes!   Cholesterol               169 mg/dL                   5-784     ATP III Classification:           < 200        mg/dL        Desirable          200 - 239     mg/dL        Borderline High          >= 240        mg/dL        High         Triglyceride         [H]  169 mg/dL                   <696   HDL Cholesterol           46 mg/dL                    >29   Total Chol/HDL Ratio      3.7 Ratio  VLDL Cholesterol (Calc)                             34 mg/dL                    5-28  LDL Cholesterol (Calc)                             89 mg/dL                    4-13      Sincerely,   Denny Levy MD [facility]  Appended Document: LAB Letter patient letter mailed
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
## 2010-05-14 NOTE — Letter (Signed)
Summary: LAB Letter  [facility]  [address]   [place], [place] [address]   Phone: [phone]  Fax: [phone]    04/19/2008  [facility] [place] [address] [place], [place]  [address]  Dear Ms. Polka, Your cholesterol is great as is your A1c which was 6.1 Your triglycerides are a little high--watch fatty food intake.  Cholesterol               155 mg/dL                   9-528            Triglyceride         [H]  235 mg/dL                   <413   HDL Cholesterol        36 mg/dL                    >24     LDL Cholesterol (Calc)                             72 mg/dL                    4-01     All of the other labs looked good except your potassium which is low. We need to increase your dose of potassium to 2 tabs a day. I will send a new rx in.   Sincerely,   Denny Levy MD [facility]  Appended Document: LAB Letter sent  Appended Document: LAB Letter sent

## 2010-05-14 NOTE — Consult Note (Signed)
Summary: [facility]  [facility]   Imported By: Haydee Salter 02/10/2008 15:09:40  _____________________________________________________________________  External Attachment:    Type:   Image     Comment:   External Document

## 2010-05-14 NOTE — Assessment & Plan Note (Signed)
Summary: knee pain wp    Vital Signs:  Patient Profile:   65 Years Old Female Height:     63.5 inches (161.29 cm) Weight:      214 pounds (97.27 kg) BMI:     37.45 Temp:     98.9 degrees F (37.17 degrees C) Pulse rate:   82 / minute BP sitting:   127 / 75  (left arm)  Pt. in pain?   yes    Location:   knees    Intensity:   7  Vitals Entered By: Tomasa Rand (March 30, 2007 8:43 AM)                  PCP:  [name] NEAL MD  Chief Complaint:  Pt c/o bilateral knee pain for years.  History of Present Illness: B knee pain similar to previous OA pain. Last shots helped 2 m. No swelling or erythema, no locking.  Current Allergies: LISINOPRIL (LISINOPRIL)      Physical Exam  B knees external changes OA. No effusion. Full ext and flexion. Skin no lesion Patient given informed consent for injection. Discussed possible complications ofinfection, bleeding or skin atrophy at site of injection. Possible side effect of avascular necrosis (focal area of bone death) due to steroid use. Are cleaned and prepped in usual sterile fashion. A --1cc-- cc kennalog plus ---4-cc 2% lidocaine without epinephrine was injected into the---. Patient btolerated procedure well with no complications.  Patient given informed consent for injection. Discussed possible complications ofinfection, bleeding or skin atrophy at site of injection. Possible side effect of avascular necrosis (focal area of bone death) due to steroid use. Are cleaned and prepped in usual sterile fashion. A --1-- cc kennalog plus ----4cc 2% lidocaine without epinephrine was injected into the---. Patient btolerated procedure well with no complications.     Impression & Recommendations:  Problem # 1:  OSTEOARTHRITIS, KNEE (ICD-715.96) Assessment: Deteriorated  Her updated medication list for this problem includes:    Celebrex 200 Mg Caps (Celecoxib) .Marland Kitchen... 1 by mouth once daily as needed knee pain    Anacin 81 Mg Tbec  (Aspirin) ..... Once daily  Orders: [facility]- Est Level  3 (13086) Injection, large joint- [facility] (20610) Injection, large joint- [facility] (20610)   Complete Medication List: 1)  Hydrochlorothiazide 25 Mg Tabs (Hydrochlorothiazide) .Marland Kitchen.. 1 by mouth once daily 2)  Amlodipine Besylate 10 Mg Tabs (Amlodipine besylate) .Marland Kitchen.. 1 by mouth qd 3)  Simvastatin 40 Mg Tabs (Simvastatin) .Marland Kitchen.. 1 by mouth once daily 4)  Bupropion Hcl 100 Mg Tb12 (Bupropion hcl) .Marland Kitchen.. 1 by mouth two times a day 5)  Celebrex 200 Mg Caps (Celecoxib) .Marland Kitchen.. 1 by mouth once daily as needed knee pain 6)  Anacin 81 Mg Tbec (Aspirin) .... Once daily   Patient Instructions: 1)  we discussed f/u w ortho/sm in future as shots are not lasting as long. has had a set of viscosupplementation before which did help. Might consider again. 2)  Post procedure instructions given.    ]

## 2010-05-14 NOTE — Assessment & Plan Note (Signed)
Summary: fu wp   Vital Signs:  Patient Profile:   65 Years Old Female Height:     63.5 inches (161.29 cm) Weight:      215.8 pounds Temp:     98.5 degrees F Pulse rate:   77 / minute BP sitting:   127 / 68  (left arm)  Vitals Entered ByJacki Cones RN (Aug 18, 2007 8:41 AM)                  PCP:  Viral Schramm MD  Chief Complaint:  f/u, having dizziness, and knot on inner thigh.  History of Present Illness: noticed a "knot" in her right thigh several weeks ago. Has not changed in size, not tender. No otehr knots. may have been there as long as 2-3 months, not quite sure  financial difficulties---new insurance w several thousand dollar deductable. Having trouble getting her meds.  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 cannot afford another brace or more testing right now.  new stressors re the death of her daughter 43 y ago. she says she is working through them ok.  Follow up hypertension. Taking medicines regularly with no problems. Not having any any headaches or chest pains.     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,        basal cell removed x 2,        breast lump benign--1987,        esophageal stricture--dilated 1999,        h/o bdz dependence--now off--do not prescribe,        mod osteoarthritis knees        Diabetes mellitus, type II       Hyperlipidemia       Hypertension  Past Surgical History:    Reviewed history from 01/27/2007 and no changes required:       Cholecystectomy - 03/14/1988,        colonoscopy--diverticulosis - 10/12/2005, c-section - 06/13/1983,        esophageal dilation - 07/05/2003, ETT  neg, fair fitness - 08/12/2005,        Hysterectomy - ovaries remain  06/12/1997,        viscosup. Of knees 2006 - 02/10/2006   Social History:    Married to [employer]; Son [name] with MR.  Other son with HIV--deceased; No smoking or alcohol; Works in [employer]    had daughter who died in suspicious car accident in 1988--recently (08/2007) told by [facility] that it was a homicide     Physical Exam  Neck:     supple, full ROM, and no masses.   Lungs:     normal respiratory effort and normal breath sounds.   Heart:     normal rate, regular rhythm, and no gallop.   Abdomen:     soft, non-tender, and normal bowel sounds.   Msk:     + Tinel at B wrists, no thenar atrophy Neurologic:     alert & oriented X3.   Psych:     Oriented X3 and normally interactive.  some lability of mood with tearfulness when discussing issues surrounding her daughters death. denies SI/HI Additional Exam:  small 3/4 cm mobile mass deep in fatty tissue of right thigh. Not assocaited with any other masses, no lymph nodes identified---this is outside the area of the inguinal lymph node chain. No LAD in axilla, inguinal or neck area on complete node exam.    Impression & Recommendations:  Problem # 1:  NEOPLASM UNSPEC NATURE OTHER SPEC SITES (ICD-239.8) this does not exactly feel like a lymph node. I wanted to have her see gen surgeon for further eval but she wants to put that off for a while for financial reasons. Will follow this up in 2 m, and she will call in interim if it changes or she develops other lumps or sx. i suspect this is benign. Orders: [facility]- Est  Level 4 (16109)   Problem # 2:  DIABETES MELLITUS, TYPE II (ICD-250.00)  Her updated medication list for this problem includes:    Anacin 81 Mg Tbec (Aspirin) ..... Once daily    Metformin Hcl 850 Mg Tabs (Metformin hcl) .Marland Kitchen... 1 by mouth once daily to replace actos met difficulty affording her meds w some am dizziness---will try switching  her off the actos met to plain metformin she refused labs today because of  financial issues-rtc 55m Orders: [facility]- Est  Level 4 (60454)   Complete Medication List: 1)  Hydrochlorothiazide 25 Mg Tabs (Hydrochlorothiazide) .Marland Kitchen.. 1 by mouth once daily 2)  Amlodipine Besylate 10 Mg Tabs (Amlodipine besylate) .Marland Kitchen.. 1 by mouth qd 3)  Simvastatin 40 Mg Tabs (Simvastatin) .Marland Kitchen.. 1 by mouth once daily 4)  Bupropion Hcl 100 Mg Tb12 (Bupropion hcl) .Marland Kitchen.. 1 by mouth two times a day 5)  Potassium Chloride 20 Meq Pack (Potassium chloride) .Marland Kitchen.. 1 by mouth qd 6)  Anacin 81 Mg Tbec (Aspirin) .... Once daily 7)  Metformin Hcl 850 Mg Tabs (Metformin hcl) .Marland Kitchen.. 1 by mouth once daily to replace actos met 8)  Clonidine Hcl 0.2 Mg Tabs (Clonidine hcl) .Marland Kitchen.. 1 by mouth twice a day 9)  Ibuprofen 800 Mg Tabs (Ibuprofen) .Marland Kitchen.. 1 by mouth two times a day or three times a day as needed pain   Patient Instructions: 1)  finish up the actos met that you have and then change to the metformin 850 one a day in its place. I think this will help with the dizziness and still give [place] great control 2)  Please schedule a follow-up appointment in 2 months.   Prescriptions: IBUPROFEN 800 MG TABS (IBUPROFEN) 1 by mouth two times a day or three times a day as needed pain  #90 x 5   Entered and Authorized by:   Denny Levy MD   Signed by:   Denny Levy MD on 08/18/2007   Method used:   [facility] to ...       [id number]       [address]       [place], [place]  [address]       Ph: [phone] or [phone]       Fax: [phone]   RxID:   [id number] CLONIDINE HCL 0.2 MG TABS (CLONIDINE HCL) 1 by mouth twice a day  #60 x 12   Entered and Authorized by:   Denny Levy MD   Signed by:   Denny Levy MD on 08/18/2007   Method used:   Electronically sent to ...       [id number]       [address]       [place], [place]  [address]       Ph: [phone] or [phone]       Fax: [phone]   RxID:    [id number] BUPROPION HCL 100 MG  TB12 (BUPROPION HCL) 1 by mouth two times a day  #60 x 12   Entered and Authorized by:   Denny Levy MD   Signed by:   Denny Levy MD on 08/18/2007   Method used:   Electronically sent to ...       [id number]       [address]       [place], [place]  [address]       Ph: [phone] or [phone]       Fax: [phone]   RxID:   [id number] SIMVASTATIN 40 MG  TABS (SIMVASTATIN) 1 by mouth once daily  #30 x 12   Entered and Authorized by:   Denny Levy MD   Signed by:   Denny Levy MD on 08/18/2007   Method used:   Electronically sent to ...       [id number]       [address]       [place], [place]  [address]       Ph: [phone] or [phone]       Fax: [phone]   RxID:   [id number] AMLODIPINE BESYLATE 10 MG  TABS (AMLODIPINE BESYLATE) 1 by mouth qd  #30 x 12   Entered and Authorized by:   Denny Levy MD   Signed by:   Denny Levy MD on 08/18/2007   Method used:   Electronically sent to ...       [id number]       [address]       [place], [place]  [address]       Ph: [phone] or [phone]       Fax: [phone]   RxID:   [id number] POTASSIUM CHLORIDE 20 MEQ  PACK (POTASSIUM CHLORIDE) 1 by mouth qd  #30 x 12   Entered and Authorized by:   Denny Levy MD   Signed by:   Denny Levy MD on 08/18/2007   Method used:   Electronically sent to ...       [id number]       [address]       [place], [place]  [address]       Ph: [phone] or [phone]       Fax: [phone]   RxID:   [id number] METFORMIN HCL 850 MG  TABS (METFORMIN HCL) 1 by mouth once daily to replace actos met  #30 x 12   Entered and Authorized by:   Denny Levy MD   Signed by:   Denny Levy MD on 08/18/2007   Method used:   Electronically sent to ...       [id number]       [address]       [place], [place]  [address]       Ph: [phone] or [phone]       Fax: [phone]   RxID:  [id number]  ] 

## 2010-05-14 NOTE — Assessment & Plan Note (Signed)
Summary: f/u visit/bmc   Vital Signs:  Patient profile:   65 year old female Height:      63.5 inches Weight:      184.38 pounds BMI:     32.27 Temp:     97.9 degrees F oral Pulse rate:   72 / minute Pulse rhythm:   regular BP sitting:   110 / 62  (right arm)  Vitals Entered By: Modesta Messing LPN (November 30, 2008 3:18 PM) CC: Check lesion left side of face.  c/o urinary incontinence and pressure. Did not get last script for anitbiotic filled.   DM.   Is Patient Diabetic? Yes  Pain Assessment Patient in pain? no        Diabetic Foot Exam Pulse Check          Right Foot          Left Foot Posterior Tibial:        normal            normal Dorsalis Pedis:        normal            diminished    Primary Care Provider:  Denny Levy MD  CC:  Check lesion left side of face.  c/o urinary incontinence and pressure. Did not get last script for anitbiotic filled.   DM.  .  History of Present Illness: lesion on face seems to be getting bigger and darker--wants a derm eval. Has had 2 basal cells before--worries this my be 'something bad".  fatige better  knee doing well s/p TKR  had stopped having anybladder signs at all until last few days--wenton a long trip and had to "hold it" for some time--sincehas had some pain and pressure sensation. No burning00no increase in her baseline urinary frequency  Habits & Providers  Alcohol-Tobacco-Diet     Tobacco Status: never  Allergies: 1)  Lisinopril (Lisinopril)  Physical Exam  Neck:  no LAD Msk:  well healed r TKR incision, miimal swelling Skin:  1 cm slightlyirregular lentigo let jas/cheek area--raised cental area. no excoriation.  Psych:  Oriented X3, memory intact for recent and remote, normally interactive, good eye contact, not anxious appearing, and not depressed appearing.     Impression & Recommendations:  Problem # 1:  LENTIGO (ICD-709.09)  Orders: Dermatology Referral (Derma) [facility]- Est  Level 4 (41660)  Problem #  2:  DYSURIA (ICD-788.1)  Orders: Urinalysis-[facility] (00000) [facility]- Est  Level 4 (63016) I suspect this is more bladder spasm--will get UA   Problem # 3:  DIABETES MELLITUS, TYPE II (ICD-250.00)  Her updated medication list for this problem includes:    Anacin 81 Mg Tbec (Aspirin) ..... Once daily    Metformin Hcl 850 Mg Tabs (Metformin hcl) .Marland Kitchen... 1 by mouth once daily to replace actos met  Orders: UA Microalbumin-[facility] (01093) [facility]- Est  Level 4 (23557)  Complete Medication List: 1)  Hydrochlorothiazide 25 Mg Tabs (Hydrochlorothiazide) .Marland Kitchen.. 1 by mouth once daily 2)  Amlodipine Besylate 10 Mg Tabs (Amlodipine besylate) .Marland Kitchen.. 1 by mouth qd 3)  Simvastatin 40 Mg Tabs (Simvastatin) .Marland Kitchen.. 1 by mouth once daily 4)  Potassium Chloride 20 Meq Pack (Potassium chloride) .... 2 by mouth once daily 5)  Anacin 81 Mg Tbec (Aspirin) .... Once daily 6)  Metformin Hcl 850 Mg Tabs (Metformin hcl) .Marland Kitchen.. 1 by mouth once daily to replace actos met 7)  Clonidine Hcl 0.2 Mg Tabs (Clonidine hcl) .Marland Kitchen.. 1 by mouth qhs 8)  Ibuprofen  800 Mg Tabs (Ibuprofen) .Marland Kitchen.. 1 by mouth two times a day or three times a day as needed pain 9)  Trazodone Hcl 50 Mg Tabs (Trazodone hcl) .... 2 by mouth qhs 10)  Vitamin B 12 Injection  .Marland Kitchen.. 1000 micrograms q moth im 11)  Ultrameter Strips  .... Disp box checks qd 12)  Lancets  .... Use as directed testing once daily disp box 13)  Cipro 250 Mg Tabs (Ciprofloxacin hcl) .Marland Kitchen.. 1 by mouth id for 7 days 14)  Promethazine Hcl 12.5 Mg Tabs (Promethazine hcl) .Marland Kitchen.. 1 by mouth at bedtime for nausea  Patient Instructions: 1)  Please schedule a follow-up appointment in 1 month.   Prevention & Chronic Care Immunizations   Influenza vaccine: given  (01/05/2008)   Influenza vaccine due: 01/04/2009    Tetanus booster: 08/13/2003: Done.   Tetanus booster due: 08/12/2013    Pneumococcal vaccine: Pneumovax  (01/29/2007)   Pneumococcal vaccine due: None    H. zoster vaccine: Not  documented  Colorectal Screening   Hemoccult: had colonoscopy not indicated  (04/11/2008)   Hemoccult due: Not Indicated    Colonoscopy: Done.  (10/12/2005)   Colonoscopy due: 10/13/2015  Other Screening   Pap smear: normal  (03/22/2008)   Pap smear due: Not Indicated    Mammogram: Normal  (12/28/2007)   Mammogram due: 12/27/2008    DXA bone density scan: Not documented   Smoking status: never  (11/30/2008)  Diabetes Mellitus   HgbA1C: 5.4  (09/20/2008)   Hemoglobin A1C due: 01/06/2008    Eye exam: no diabetic retinopathy cortical cataract probably needing lensectomy in 1-2 years dry eyes--rx restasis and artificial tears Dr [facility] [phone]  (04/05/2008)   Eye exam due: 04/05/2009    Foot exam: Not documented   Foot exam action/deferral: Do today   High risk foot: Not documented   Foot care education: Not documented    Urine microalbumin/creatinine ratio: Not documented   Urine microalbumin action/deferral: Ordered    Diabetes flowsheet reviewed?: Yes   Progress toward A1C goal: At goal  Lipids   Total Cholesterol: 155  (04/13/2008)   LDL: 72  (04/13/2008)   LDL Direct: Not documented   HDL: 36  (04/13/2008)   Triglycerides: 235  (04/13/2008)   Lipid panel due: 03/02/2009    SGOT (AST): 14  (09/20/2008)   SGPT (ALT): 12  (09/20/2008)   Alkaline phosphatase: 74  (09/20/2008)   Total bilirubin: 0.3  (09/20/2008)   Liver panel due: 03/02/2009    Lipid flowsheet reviewed?: Yes   Progress toward LDL goal: At goal  Hypertension   Last Blood Pressure: 110 / 62  (11/30/2008)   Serum creatinine: 1.09  (09/20/2008)   Serum potassium 3.5  (09/20/2008)    Hypertension flowsheet reviewed?: Yes   Progress toward BP goal: At goal  Self-Management Support :    Diabetes self-management support: Not documented    Hypertension self-management support: Not documented    Lipid self-management support: Not documented    Nursing  Instructions: Diabetic foot exam today   Appended Document: UA results  Laboratory Results   Urine Tests  Date/Time Received: November 30, 2008 4:17 PM  Date/Time Reported: November 30, 2008 4:34 PM   Routine Urinalysis   Color: yellow Appearance: Clear Glucose: negative   (Normal Range: Negative) Bilirubin: negative   (Normal Range: Negative) Ketone: negative   (Normal Range: Negative) Spec. Gravity: 1.025   (Normal Range: 1.003-1.035) Blood: small   (Normal Range: Negative) pH:  5.5   (Normal Range: 5.0-8.0) Protein: negative   (Normal Range: Negative) Urobilinogen: 0.2   (Normal Range: 0-1) Nitrite: negative   (Normal Range: Negative) Leukocyte Esterace: small   (Normal Range: Negative)  Urine Microscopic WBC/HPF: 1-5 RBC/HPF: 0-2 Bacteria/HPF: trace Mucous/HPF: trace Epithelial/HPF: 10-20  Microalbumin (urine): trace mg/L   Comments: 4cc spun ...........test performed by...........Marland KitchenTerese Door, CMA

## 2010-05-14 NOTE — Progress Notes (Signed)
Summary: Rx Req  Phone Note Call from Patient Call back at [phone]   Caller: Patient Summary of Call: Pt would like to get her Vicoden increased to #60 vs the 30 she was getting.  [facility]. Initial call taken by: Clydell Hakim,  November 12, 2009 3:11 PM  Follow-up for Phone Call        pt is having tooth extracted next Monday and dentist is telling her that her doctor needs to put her on antibiotic  Follow-up by: De Nurse,  November 13, 2009 10:11 AM  Additional Follow-up for Phone Call Additional follow up Details #1::        Phone Call Completed. Spoke with ms. Dobkins and informed her or rx called in. Additional Follow-up by: Jimmy Footman, CMA,  November 13, 2009 10:57 AM    New/Updated Medications: VICODIN 5-500 MG TABS (HYDROCODONE-ACETAMINOPHEN) 1-2 by mouth at bedtime as needed back pain CLINDAMYCIN HCL 300 MG CAPS (CLINDAMYCIN HCL) sig 2 pills one hour prior to tooth extraction Prescriptions: CLINDAMYCIN HCL 300 MG CAPS (CLINDAMYCIN HCL) sig 2 pills one hour prior to tooth extraction  #2 x 0   Entered and Authorized by:   Denny Levy MD   Signed by:   Denny Levy MD on 11/13/2009   Method used:   Electronically to        [facility] [address] [phone]* (retail)       [address]       [place], [place]  [address]       Ph: [phone] or [phone]       Fax: [phone]   RxID:   [id number] VICODIN 5-500 MG TABS (HYDROCODONE-ACETAMINOPHEN) 1-2 by mouth at bedtime as needed back pain  #60 x 5   Entered and Authorized by:   Denny Levy MD   Signed by:   Denny Levy MD on 11/13/2009   Method used:   Historical   RxID:   [id number]   DEAR WHITE TEAM Ok I have increased her vicodin to TWO at night---I do wantr to leave it at that dose--I do not want to increase it any more any time soon. I have called in a rx for antibiotic prophylaxis for tooth extration. PLEASE CALL IN the vicodin rx as above Thanks!  Denny Levy MD  November 13, 2009 10:47 AM

## 2010-05-14 NOTE — Progress Notes (Signed)
Summary: Triage   Phone Note Call from Patient Call back at Work Phone [phone] Call back at [phone]   Caller: Daughter-Michelle Moore Summary of Call: Daughter concerned that the Remus Loffler will not work through the night and the mom wants to take the trazodone.    Initial call taken by: Clydell Hakim,  Aug 14, 2008 4:50 PM  Follow-up for Phone Call        she has been changed to the hydrocodone by the surgeon. Took 2 tablets of that at 12 & 4pm. He also ordered the [place] & she already took it at 4:15. wants to take the trazadone as well. told dtr no, speak with surgeon's office again. It may be too much for her to add that to the mix. states her mom is anxious & has always been. she is hiding the meds so she does not take too much. Follow-up by: Golden Circle RN,  Aug 14, 2008 4:55 PM  Additional Follow-up for Phone Call Additional follow up Details #1::        [phone] - Jovani Colquhoun needs to discuss med with them- Tera Mater- she feels the anxiety and needs to know if she can take it.  she has them, but it's not on the list so she needs to know from Dr Jennette Kettle that she can take it. Additional Follow-up by: De Nurse,  Aug 15, 2008 9:02 AM    Additional Follow-up for Phone Call Additional follow up Details #2::    pt needs to talk to someone.  having a lot of anxiety Follow-up by: De Nurse,  Aug 16, 2008 8:33 AM  Additional Follow-up for Phone Call Additional follow up Details #3:: Details for Additional Follow-up Action Taken: spoke with dtr. pt wants ambien & trazadone. states the surgeon told her it was up to her pcp, but he did not think she should have both at the same time. dtr states pt is "begging" for it & she has taken it out of the house. wants Dr. Donnetta Hail decision. message to pcp  Kennon Rounds stop the [place] and throw it away--keep her on her rx dose of trazodone. The pain pills are up to teh surgeon. Thanks!  Denny Levy MD  Aug 16, 2008 9:09 AM  Additional  Follow-up by: Golden Circle RN,  Aug 16, 2008 8:36 AM  gave dtr above message from the md..Marland KitchenMarland KitchenGolden Circle RN  Aug 16, 2008 9:14 AM

## 2010-05-14 NOTE — Progress Notes (Signed)
   Phone Note Outgoing Call   Summary of Call: DEAR RED TEAM:  please call her and REMIND her she is due to get her blood work done [employer] or [employer] AM--not open pm  --I want to get it before her insurance puts her back in donut whole orders are already in best of she comes fasting but come regardless  SARA NEAL MD  April 11, 2008 3:48 PM   Follow-up for Phone Call        Pt informed of the above Follow-up by: [facility] CMA,  April 11, 2008 4:32 PM       Last Flu Vaccine:  Fluvax Non-MCR (01/05/2008 9:30:02 AM) Flu Vaccine Result Date:  01/05/2008 Flu Vaccine Result:  given Last LDL:  89 (01/27/2007 7:30:00 PM) LDL Next Due:  1 yr Flex Sig Result Date:  04/11/2008 Flex Sig Result:  had colonoscopy not indicated Flex Sig Next Due:  Not Indicated Hemoccult Result Date:  04/11/2008 Hemoccult Result:  had colonoscopy not indicated Hemoccult Next Due:  Not Indicated Last PAP:  NEGATIVE FOR INTRAEPITHELIAL LESIONS OR MALIGNANCY. (03/22/2008 12:00:00 AM) PAP Result Date:  03/22/2008 PAP Result:  normal PAP Next Due:  Not Indicated

## 2010-05-14 NOTE — Assessment & Plan Note (Signed)
Summary: FU WP   Vital Signs:  Patient Profile:   65 Years Old Female Height:     63.5 inches (161.29 cm) Weight:      211.8 pounds BMI:     37.06 Temp:     97.9 degrees F oral Pulse rate:   89 / minute BP sitting:   148 / 86  (left arm) Cuff size:   large  Pt. in pain?   no  Vitals Entered By: Garen Grams LPN (December 01, 2007 9:56 AM)                  PCP:  SARA NEAL MD  Chief Complaint:  leg pain and "trembling in arms".  History of Present Illness: knees did well for about 7 weeks then staring to hurt again. [name] a week long training coming up in 2 weeks and needs to be able to stand most of that time. Wants to get early knee injections if at all possible  Also having soem occasional r calf pain--really cannot say if it is related to exertion--just intermittent--4/10, self resolves. no foot nymbness.  Having fatigue in last month--has no energy. Does not feel like it is depression--there are things she wants to do but has to force herself to get going and do them. 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,       basal cell removed x 2,        breast lump benign--1987,        esophageal stricture--dilated 1999, 2005       h/o bdz dependence--now off--do not prescribe       mod osteoarthritis knees s/p multiple knee injections       Diabetes mellitus, type II - A1C October 06 2007 5.9%       Hyperlipidemia       Hypertension  Past Surgical History:    Reviewed history from 10/07/2007 and no changes required:       Cholecystectomy - 03/14/1988,        colonoscopy--diverticulosis - 10/12/2005       c-section - 06/13/1983       esophageal dilation - 07/05/2003       ETT  neg, fair fitness - 08/12/2005 with neg cardiolite May 2008       Hysterectomy - ovaries remain  06/12/1997,        viscosup. Of knees 2006 - 02/10/2006      Physical Exam  General:     overweight-appearing and pale.   Neck:     supple, full ROM, and no masses.   Lungs:     normal respiratory effort, normal breath sounds, and no wheezes.   Heart:     normal rate, regular rhythm, and no gallop.   Abdomen:     soft, non-tender, normal bowel sounds, and no distention.   Msk:     normal ROM, no joint tenderness, no joint swelling, and no redness over joints.   Neurologic:     alert & oriented X3.   Psych:     Oriented X3, normally interactive, not anxious appearing, and not depressed appearing.   Additional Exam:     B knees have external changes of OA, right knee has significant medial joint line tenderness. + crepitus B. Feull  extension. ligamentously intact. no effusion or erythema. Patient given informed consent for injection. Discussed possible complications ofinfection, bleeding or skin atrophy at site of injection. Possible side effect of avascular necrosis (focal area of bone death) due to steroid use. Are cleaned and prepped in usual sterile fashion. A --1-- cc kennalog40 plus --3--cc 1% lidocaine without epinephrine was injected intoeach knee using anterior approach---. Patient btolerated procedure well with no complications.     j  Impression & Recommendations:  Problem # 1:  FATIGUE, ACUTE (ICD-780.79) Assessment: New  Orders: Hemoglobin-[facility] (40981) TSH-[facility] ([id number]) [facility]- Est  Level 4 (21308) I think it is related to stressors and lack of regular exercise but wil rule out anemia etc specially as she appears somewhat pale today.  Problem # 2:  OSTEOARTHRITIS, KNEE (ICD-715.96) Assessment: Deteriorated  Her updated medication list for this problem includes:    Anacin 81 Mg Tbec (Aspirin) ..... Once Young    Ibuprofen 800 Mg Tabs (Ibuprofen) .Marland Kitchen... 1 by mouth two times a day or three times a day as needed pain  Orders: [facility]- Est  Level 4  (65784) Injection, large joint- [facility] (20610) Injection, large joint- [facility] (20610)   Problem # 3:  DIABETES MELLITUS, TYPE II (ICD-250.00) Assessment: Unchanged  Her updated medication list for this problem includes:    Anacin 81 Mg Tbec (Aspirin) ..... Once Young    Metformin Hcl 850 Mg Tabs (Metformin hcl) .Marland Kitchen... 1 by mouth once Young to replace actos met  Orders: Comp Met-[facility] ([id number]) [facility]- Est  Level 4 (32440) will check glucose--she is not due A1c--has had pretty good control in past but wonder if this is part of her fatigue issues.  Complete Medication List: 1)  Hydrochlorothiazide 25 Mg Tabs (Hydrochlorothiazide) .Marland Kitchen.. 1 by mouth once Young 2)  Amlodipine Besylate 10 Mg Tabs (Amlodipine besylate) .Marland Kitchen.. 1 by mouth qd 3)  Simvastatin 40 Mg Tabs (Simvastatin) .Marland Kitchen.. 1 by mouth once Young 4)  Bupropion Hcl 100 Mg Tb12 (Bupropion hcl) .Marland Kitchen.. 1 by mouth two times a day 5)  Potassium Chloride 20 Meq Pack (Potassium chloride) .Marland Kitchen.. 1 by mouth qd 6)  Anacin 81 Mg Tbec (Aspirin) .... Once Young 7)  Metformin Hcl 850 Mg Tabs (Metformin hcl) .Marland Kitchen.. 1 by mouth once Young to replace actos met 8)  Clonidine Hcl 0.2 Mg Tabs (Clonidine hcl) .Marland Kitchen.. 1 by mouth twice a day 9)  Ibuprofen 800 Mg Tabs (Ibuprofen) .Marland Kitchen.. 1 by mouth two times a day or three times a day as needed pain 10)  Ambien 10 Mg Tabs (Zolpidem tartrate) .Marland Kitchen.. 1 by mouth qhs   Patient Instructions: 1)  Please schedule a follow-up appointment in 1 month.   ] Laboratory Results   Blood Tests   Date/Time Received: December 01, 2007 10:43 AM  Date/Time Reported: December 01, 2007 10:47 AM     CBC   HGB:  13.2 g/dL   (Normal Range: 10.2-72.5 in Males, 12.0-15.0 in Females) Comments: venous sample...........test performed by...........Marland KitchenTerese Door, CMA

## 2010-05-14 NOTE — Progress Notes (Signed)
Summary: Diabetic Supplies & Appt   Phone Note Call from Patient Call back at [facility] Phone [phone]   Summary of Call: Pt wants to speak with rn about diabetic supplies and a test that is supposed to be scheduled for her. Initial call taken by: Haydee Salter,  December 21, 2007 8:42 AM  Follow-up for Phone Call        checking status. Follow-up by: Haydee Salter,  December 21, 2007 4:15 PM  Additional Follow-up for Phone Call Additional follow up Details #1::        LMOVM for pt to call us back Additional Follow-up by: [facility] CMA,  December 22, 2007 2:59 PM    Additional Follow-up for Phone Call Additional follow up Details #2::    LMOVM will await callback from pt. Follow-up by: Jone Baseman CMA,  December 24, 2007 12:54 PM

## 2010-05-14 NOTE — Progress Notes (Signed)
----   Converted from flag ---- ---- 08/05/2006 2:59 PM, REBECCA BASSETT MD wrote: Can you refer [name] to [facility] for fatigue and chest pain.  Please attach my office note and labs drawn 4/22.  Thanks. ------------------------------  called pt/phone is disconnected. called pt's son @ [phone] [facility] for pt to call us

## 2010-05-14 NOTE — Assessment & Plan Note (Signed)
Summary: fu diabetes wk   Vital Signs:  Patient Profile:   65 Years Old Female Weight:      201 pounds Pulse rate:   80 / minute BP sitting:   113 / 61  (left arm)  Pt. in pain?   no  Vitals Entered By: Arlyss Repress CMA, (August 04, 2006 4:06 PM)              Is Patient Diabetic? Yes    Procedure Note Last Tetanus: Done. (08/13/2003)  Injections: Indication: chronic pain  Procedure # 1: joint injection    Region: lateral    Location: l. and r. knee    Technique: 20 g 1-1/2' needle    Medication: 1 kenalog/3 marcaine    Anesthesia: ethyl chloride spray  Wart Removal: Onset of lesion: > 3 months Indication: painful   Procedure # 1: wart destruction    Region: lateral    Location: left upper leg    # lesions removed: 1    Technique: liquid N2    Anesthesia: none   PCP:  REBECCA BASSETT MD  Chief Complaint:  F/UP DM/REFILL MEDS.  History of Present Illness: Increasing fatigue daily for past 3 months.  Also doe-- can't walk as far as she used to.  Mood ok.  No swelling.  Some sob with exertion and occ. chest tightness with exertion in l. side of chest.  No n/v or diaphoresis.    Has been exercising less.  Gained 10 lbs in 4 months.  Sugars in good control.  Fastings are 90-110.    Current Problems:  FAMILY HISTORY DIABETES 1ST DEGREE RELATIVE (ICD-V18.0) FAMILY HISTORY OF CAD FEMALE 1ST DEGREE RELATIVE <50 (ICD-V17.3) HYPERTENSION (ICD-401.9) HYPERLIPIDEMIA (ICD-272.4) DIABETES MELLITUS, TYPE II (ICD-250.00) WART, LEFT KNEE (ICD-078.10) KNEE PAIN (ICD-719.46) FATIGUE (ICD-780.79) HYPERTENSION, BENIGN SYSTEMIC (ICD-401.1) HYPERCHOLESTEROLEMIA (ICD-272.0) DIABETES MELLITUS II, UNCOMPLICATED (ICD-250.00) DEPRESSION, MAJOR, RECURRENT (ICD-296.30)  Allergies:     ace inhibitors (unspecified)  Medications:      actoplus met (Tablet 15-500 mg) 1 tablet by mouth twice a day      AMLODIPINE BESYLATE 10 MG TMYL TAKE 1 TABLET BY MOUTH EVERY DAY      aspirin  (Tablet, Chewable 81 mg) 1 tablet by mouth once a day      clonidine (Tablet 0.2 mg) 1 tablet by mouth twice a day TAKE 1 TABLET TWICE DAILY      HYDROCHLOROTHIAZIDE 25 MG TQUA TAKE 1 TABLET BY MOUTH EVERY DAY      KLOR-CON M20 TABLET UPS 1T;PO;BID;FOR 1 WEEK;THEN 1 TABLET DAILY THEREAFTER      Prilosec (Capsule, Delayed Release(E.C.) 20 mg) 1 capsule by mouth once a day      Wellbutrin (Tablet 100 mg) 1 tablet by mouth twice a day      Zocor (Tablet 40 mg) 1 tablet by mouth at bedtime     Hypertension History:      She complains of chest pain and dyspnea with exertion, but denies headache, palpitations, orthopnea, PND, peripheral edema, visual symptoms, neurologic problems, syncope, and side effects from treatment.  She notes no problems with any antihypertensive medication side effects.        Positive major cardiovascular risk factors include female age 31 years old or older, diabetes, hyperlipidemia, and hypertension.  Negative major cardiovascular risk factors include non-tobacco-user status.       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 Of knees 2006 - 02/10/2006   Family History:    Reviewed history from 06/11/2006 and no changes required:       cad--father and sibs in 27`s, dm--mom       Family History of CAD Female 1st degree relative <50       Family History Diabetes 1st degree relative  Social History:    Reviewed history from  06/11/2006 and no changes required:       Married to [employer]; Son [name] with MR.  Other son with HIV--deceased; No smoking or alcohol; Works in Home Health   Risk Factors:  Tobacco use:  never Alcohol use:  no    Physical Exam  General:     alert, well-developed, and overweight-appearing.   Neck:     no jvd Lungs:     Normal respiratory effort, chest expands symmetrically. Lungs are clear to auscultation, no crackles or wheezes. Heart:     Normal rate and regular rhythm. S1 and S2 normal without gallop, murmur, click, rub or other extra sounds. Abdomen:     soft, non-tender, normal bowel sounds, no distention, and no masses.   Msk:     Crepitance of both knees bilaterally.  No swelling or warmth.  flexion to 110, full extension.   Pulses:     R and L dorsalis pedis and posterior tibial pulses are full and equal bilaterally Extremities:     no edema Psych:     Oriented X3, memory intact for recent and remote, normally interactive, good eye contact, not anxious appearing, and not depressed appearing.      Impression & Recommendations:  Problem # 1:  HYPERTENSION, BENIGN SYSTEMIC (ICD-401.1) Controlled on hctz, norvasc and clonidine.  Multiple risk factors for cad--fh, htn, dm, obesity.  Had neg ETT in 5/07 but only exercised for 6 minutes.  Will recheck lipids/electrolytes.  Refer to cardiology for further eval for CAD.  Orders: [facility]- Est  Level 4 (99214) Comp Met-[facility] ([id number]) Lipid-[facility] ([id number])   Problem # 2:  DIABETES MELLITUS II, UNCOMPLICATED (ICD-250.00) Controlled with home cbg on actosplusmet.  Recent weight gain but no edema to suggest that actos is problem.  Check aic and continue meds for now.   Orders: A1C-[facility] (24401)   Problem # 3:  KNEE PAIN (ICD-719.46) Chronic OA.  Improved for 6 months with viscosupplementation.  Injected today with kenalog/marcaine. Orders: Injection, large joint- [facility] (20610)   Problem # 4:  WART, LEFT KNEE  (ICD-078.10) Liquid nitrogen applied Orders: Cryo (1st lesion) benign - [facility] (17000)   Problem # 5:  FATIGUE (ICD-780.79) Unclear etiology.  Check labs and refer to cards for w/up.  Possible pulm w/up (sleep study/spirometry) if cards w/up neg. Orders: Comp Met-[facility] [id number]) CBC-[facility] (03474) TSH-[facility] [id number])   Hypertension Assessment/Plan:      The patient's hypertensive risk group is category C: Target organ damage and/or diabetes.  Today's blood pressure is 113/61.     Laboratory Results   Blood Tests   Date/Time Recieved: August 04, 2006 5:00  PM  Date/Time Reported:  August 04, 2006 5:13 PM   HGBA1C: 5.5%   (Normal Range: Non-Diabetic - 3-6%   Control Diabetic - 6-8%)  CBC WBC:  7.7   (Normal Range: 4.5-11.0) RBC:  4.04   (Normal Range 4.20-5.40) HGB:  13.2 g/dL   (Normal Range: 40.3-47.4 in Males, 12.0-15.0 in Females) Hct:  38.0 %   (Normal Range: 36.0-46.0) MCV:  94.0   (Normal Range: 80.0-100.0) Plt.:  348   (Normal Range: 150-450) Comments: ...............test performed by......Marland KitchenBonnie A. [place], [place] (ASCP)       Exercise Stress Test  Procedure date:  08/20/2005  Findings:      Exercised 6 minutes Bruce protocol.  Achieved 90% max heart rate without significant st seg changes.  2 PVC.  Normal bp response.  Poor-fair finess level

## 2010-05-14 NOTE — Assessment & Plan Note (Signed)
Summary: FU/EO   Vital Signs:  Patient Profile:   65 Years Old Female Height:     63.5 inches (161.29 cm) Weight:      205.5 pounds Temp:     98.9 degrees F Pulse rate:   57 / minute BP sitting:   98 / 61  (left arm)  Pt. in pain?   yes    Location:   right knee  Vitals Entered By: Jacki Cones RN (January 05, 2008 9:48 AM)                  PCP:  Denny Levy MD  Chief Complaint:  f/u weakness and leg pain.  History of Present Illness: f/u fatigue--no better. she also recently had stomach flu but thinks she is finally getting over that.  also continues to have left hand numbness, worse in am and with certain activities rest makes it some better    Current Allergies: LISINOPRIL (LISINOPRIL)    Risk Factors:  Mammogram History:     Date of Last Mammogram:  12/28/2007   Colonoscopy History:     Date of Last Colonoscopy:  10/12/2005   PAP Smear History:     Date of Last PAP Smear:  02/13/2004    Review of Systems  The patient denies fever, hoarseness, syncope, peripheral edema, headaches, hematuria, and muscle weakness.     Physical Exam  Neck:     supple, full ROM, and no masses.   Lungs:     normal respiratory effort and normal breath sounds.   Heart:     normal rate, regular rhythm, and no murmur.   Msk:     normal ROM, no joint tenderness, no joint swelling, and no joint warmth.   Neurologic:     alert & oriented X3.   Psych:     Oriented X3, normally interactive, and not depressed appearing.   Additional Exam:     left hand + phalen, neg tinel. normal grip strength and no muscle atrophy    Impression & Recommendations:  Problem # 1:  FATIGUE, ACUTE (ICD-780.79) Assessment: Unchanged  Orders: B12-[facility] ([id number]) [facility]- Est Level  3 (14782) wil decrease her clonidine--her fatigue may be a side effect of that--her BP is low today. Perhaps we have her over treated. rtc 3-4 w  Problem # 2:  CARPAL TUNNEL SYNDROME, LEFT (ICD-354.0) order  pncv Orders: [facility]- Est Level  3 (95621) Nerve Conduction (Nerve Conduction)   Complete Medication List: 1)  Hydrochlorothiazide 25 Mg Tabs (Hydrochlorothiazide) .Marland Kitchen.. 1 by mouth once daily 2)  Amlodipine Besylate 10 Mg Tabs (Amlodipine besylate) .Marland Kitchen.. 1 by mouth qd 3)  Simvastatin 40 Mg Tabs (Simvastatin) .Marland Kitchen.. 1 by mouth once daily 4)  Bupropion Hcl 100 Mg Tb12 (Bupropion hcl) .Marland Kitchen.. 1 by mouth two times a day 5)  Potassium Chloride 20 Meq Pack (Potassium chloride) .Marland Kitchen.. 1 by mouth qd 6)  Anacin 81 Mg Tbec (Aspirin) .... Once daily 7)  Metformin Hcl 850 Mg Tabs (Metformin hcl) .Marland Kitchen.. 1 by mouth once daily to replace actos met 8)  Clonidine Hcl 0.2 Mg Tabs (Clonidine hcl) .... 1/2 tab  by mouth twice a day 9)  Ibuprofen 800 Mg Tabs (Ibuprofen) .Marland Kitchen.. 1 by mouth two times a day or three times a day as needed pain 10)  Ambien 10 Mg Tabs (Zolpidem tartrate) .Marland Kitchen.. 1 by mouth qhs 11)  Vitamin B 12 Injection  .Marland Kitchen.. 1000 micrograms q moth im  Other Orders: Influenza Vaccine NON MCR (30865) A1C-[facility] (  81191)    Patient Instructions: 1)  Start cutting your clonidine tablets in half and take one half tab in morning and one half tab in evening. Please get one or two blood pressure readings and give [place] a call next week to tell us 2)  1) how do you feel 3)  2) what the BP readings were   ]  Influenza Vaccine    Vaccine Type: Fluvax Non-MCR    Site: right deltoid    Mfr: GlaxoSmithKline    Dose: 0.5 ml    Route: IM    Given by: AMY MARTIN RN    Exp. Date: 10/11/2008    Lot #: [id number]    VIS given: 11/05/06 version given January 05, 2008.  Flu Vaccine Consent Questions    Do you have a history of severe allergic reactions to this vaccine? no    Any prior history of allergic reactions to egg and/or gelatin? no    Do you have a sensitivity to the preservative Thimersol? no    Do you have a past history of Guillan-Barre Syndrome? no    Do you currently have an acute febrile illness? no    Have  you ever had a severe reaction to latex? no    Vaccine information given and explained to patient? yes    Are you currently pregnant? no  Laboratory Results   Blood Tests   Date/Time Received: January 05, 2008 10:29 AM  Date/Time Reported: January 05, 2008 10:41 AM   HGBA1C: 6.2%   (Normal Range: Non-Diabetic - 3-6%   Control Diabetic - 6-8%)  Comments: ...........test performed by...........Marland KitchenTerese Door, CMA      Appended Document: FU/EO  she also told me she had stopped wellbutrin 4-6 weeks ago   Clinical Lists Changes
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
## 2010-05-14 NOTE — Miscellaneous (Signed)
   Clinical Lists Changes  Observations: Added new observation of DIAB EYE EX: no diabetic retinopathy cortical cataract probably needing lensectomy in 1-2 years dry eyes--rx restasis and artificial tears Dr Wynona Luna [facility] [phone] (04/05/2008 10:53)        Ophthalmology Exam  Procedure date:  04/05/2008  Findings:      no diabetic retinopathy cortical cataract probably needing lensectomy in 1-2 years dry eyes--rx restasis and artificial tears Dr Wynona Luna [facility] [phone]   Ophthalmology Exam  Procedure date:  04/05/2008  Findings:      no diabetic retinopathy cortical cataract probably needing lensectomy in 1-2 years dry eyes--rx restasis and artificial tears Dr Wynona Luna [facility] [phone]

## 2010-05-14 NOTE — Assessment & Plan Note (Signed)
Summary: FU/KH   Vital Signs:  Patient profile:   65 year old female Weight:      180.9 pounds Temp:     97.6 degrees F oral Pulse rate:   70 / minute BP sitting:   104 / 73  (left arm)  Vitals Entered By: Alphia Kava (January 24, 2009 9:04 AM) CC: f/u Is Patient Diabetic? Yes   Primary Care Provider:  Denny Levy MD  CC:  f/u.  History of Present Illness: r hip pain, left shoulder pain. Both have been worsening iver last 2 months. No specific injury. has had this type of shoulder pain before. never had hip pain. PMH sig for TKR recently.  Pain 4/10 shoudler. 3-4 /10 hip. Both are aching, worse as day gets longer. No numbness or tingling.  Follow up hypertension. Taking medicines regularly with no problems. Not having any any headaches or chest pains.  \par Diabetes follow up with blood sugars in    good control,   no episodes of low blood sugar. Taking medicines regularly and having no problems with them.  Follow-up hyperlipidemia. Trying to follow a good diet, taking medicines regularly. Not having any problems with medicines, no myalgias and no fatigue.   Habits & Providers  Alcohol-Tobacco-Diet     Tobacco Status: never  Allergies: 1)  Lisinopril (Lisinopril)  Physical Exam  General:  alert, well-developed, and well-nourished.   Neck:  supple, full ROM, no masses, no thyromegaly, no JVD, and no carotid bruits.   Lungs:  normal breath sounds.   Heart:  normal rate, regular rhythm, and no murmur.   Abdomen:  soft and normal bowel sounds.   Msk:  r hip painful with Cordella Register test. Has FROM IR/ER and is not painful. normal LE strwength B =.  left shoulder + impingement signs. 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 Once daily    Metformin Hcl 850 Mg Tabs (Metformin hcl) .Marland Kitchen... 1 by mouth once daily to replace actos met  Orders: A1C-[facility] (29562)  Complete Medication List: 1)  Hydrochlorothiazide 25 Mg Tabs (Hydrochlorothiazide) .Marland Kitchen.. 1 by mouth once daily 2)  Amlodipine Besylate 10 Mg Tabs (Amlodipine besylate) .Marland Kitchen.. 1 by mouth qd 3)  Simvastatin 40 Mg Tabs (Simvastatin) .Marland Kitchen.. 1 by mouth once daily 4)  Potassium Chloride 20 Meq Pack (Potassium chloride) .... 2 by mouth once daily 5)  Anacin 81 Mg Tbec (Aspirin) .... Once daily 6)  Metformin Hcl 850 Mg Tabs (Metformin hcl) .Marland Kitchen.. 1 by mouth once daily to replace actos met 7)  Clonidine Hcl 0.2 Mg Tabs (Clonidine hcl) .Marland Kitchen.. 1 by mouth qhs 8)  Ibuprofen 800 Mg Tabs (Ibuprofen) .Marland Kitchen.. 1 by mouth two times a day or three times a day as needed pain 9)  Trazodone Hcl 50 Mg Tabs (Trazodone hcl) .... 2 by mouth qhs 10)  Vitamin B 12 Injection  .Marland Kitchen.. 1000 micrograms q moth im 11)  Ultrameter Strips  .... Disp box checks qd 12)  Lancets  .... Use as directed testing once daily disp box 13)  Promethazine Hcl 12.5 Mg  Tabs (Promethazine hcl) .Marland Kitchen.. 1 by mouth at bedtime for nausea 14)  Oxybutynin Chloride 5 Mg Tabs (Oxybutynin chloride) .Marland Kitchen.. 1 by mouth qd 15)  Vicodin 5-500 Mg Tabs (Hydrocodone-acetaminophen) .Marland Kitchen.. 1-2 by mouth at bedtime as needed low back pain  Other Orders: Comp Met-[facility] [phone]) Direct LDL-[facility] [phone]) Influenza Vaccine NON MCR (29528) Prescriptions: VICODIN 5-500 MG TABS (HYDROCODONE-ACETAMINOPHEN) 1-2 by mouth at bedtime as needed low back pain  #60 x 1   Entered and Authorized by:   Denny Levy MD   Signed by:   Denny Levy MD on 01/24/2009   Method used:   Print then Give to Patient   RxID:   [id number]   Laboratory Results   Blood Tests     Date/Time Received: January 24, 2009 9:02 AM  Date/Time Reported: January 24, 2009 9:14 AM   HGBA1C: 5.3%   (Normal Range: Non-Diabetic - 3-6%   Control Diabetic - 6-8%)  Comments: ...............test performed by......Marland KitchenBonnie A. [place], [place] (ASCP)      Prevention & Chronic Care Immunizations   Influenza vaccine: Fluvax Non-MCR  (01/24/2009)   Influenza vaccine due: 01/04/2009    Tetanus booster: 08/13/2003: Done.   Tetanus booster due: 08/12/2013    Pneumococcal vaccine: Pneumovax  (01/29/2007)   Pneumococcal vaccine due: None    H. zoster vaccine: Not documented  Colorectal Screening   Hemoccult: had colonoscopy not indicated  (04/11/2008)   Hemoccult due: Not Indicated    Colonoscopy: Done.  (10/12/2005)   Colonoscopy due: 10/13/2015  Other Screening   Pap smear: normal  (03/22/2008)   Pap smear due: Not Indicated    Mammogram: Normal  (12/28/2007)   Mammogram due: 12/27/2008    DXA bone density scan: Not documented   Smoking status: never  (01/24/2009)  Diabetes Mellitus   HgbA1C: 5.3  (01/24/2009)   Hemoglobin A1C due: 01/06/2008    Eye exam: no diabetic retinopathy cortical cataract probably needing lensectomy in 1-2 years dry eyes--rx restasis and artificial tears Dr [facility] [phone]  (04/05/2008)   Eye exam due: 04/05/2009    Foot exam: Not documented   Foot exam action/deferral: Do today   High risk foot: Not documented   Foot care education: Not documented    Urine microalbumin/creatinine ratio: Not documented   Urine microalbumin action/deferral: Ordered    Diabetes flowsheet reviewed?: Yes   Progress toward A1C goal: At goal  Lipids   Total Cholesterol: 155  (04/13/2008)   LDL: 72  (04/13/2008)   LDL Direct: Not documented   HDL: 36  (04/13/2008)   Triglycerides: 235  (04/13/2008)   Lipid panel due: 03/02/2009    SGOT (AST): 14  (09/20/2008)   SGPT (ALT): 12  (09/20/2008) CMP ordered    Alkaline  phosphatase: 74  (09/20/2008)   Total bilirubin: 0.3  (09/20/2008)   Liver panel due: 03/02/2009    Lipid flowsheet reviewed?: Yes   Progress toward LDL goal: At goal  Hypertension   Last Blood Pressure: 104 / 73  (01/24/2009)   Serum creatinine: 1.09  (09/20/2008)   Serum potassium 3.5  (09/20/2008) CMP ordered     Hypertension flowsheet reviewed?: Yes   Progress toward BP goal: At goal  Self-Management Support :   Personal Goals (by the next clinic visit) :     Personal A1C goal: 7  (01/24/2009)     Personal blood pressure goal: 130/80  (01/24/2009)     Personal LDL goal: 100  (01/24/2009)    Diabetes self-management support:  Not documented    Diabetes self-management support not done because: Good outcomes  (01/24/2009)    Hypertension self-management support: Not documented    Hypertension self-management support not done because: Good outcomes  (01/24/2009)    Lipid self-management support: Not documented     Lipid self-management support not done because: Good outcomes  (01/24/2009)    Influenza Vaccine    Vaccine Type: Fluvax Non-MCR    Site: right deltoid    Mfr: GlaxoSmithKline    Dose: 0.5 ml    Route: IM    Given by: Alphia Kava    Exp. Date: 10/11/2009    Lot #: [id number]    VIS given: 11/05/06 version given January 24, 2009.  Flu Vaccine Consent Questions    Do you have a history of severe allergic reactions to this vaccine? no    Any prior history of allergic reactions to egg and/or gelatin? no    Do you have a sensitivity to the preservative Thimersol? no    Do you have a past history of Guillan-Barre Syndrome? no    Do you currently have an acute febrile illness? no    Have you ever had a severe reaction to latex? no    Vaccine information given and explained to patient? no    Are you currently pregnant? no

## 2010-05-14 NOTE — Miscellaneous (Signed)
  Clinical Lists Changes  Problems: Added new problem of ESOPHAGEAL STRICTURE (ICD-530.3) 
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
## 2010-05-14 NOTE — Progress Notes (Signed)
  Medications Added OXYBUTYNIN CHLORIDE 5 MG TABS (OXYBUTYNIN CHLORIDE) 1 by mouth qd       Phone Note Outgoing Call   Summary of Call: DEAR WHITE TEAM please tell her I have called this in--try it for bladder spasm--take one in am and see how she does Thanks!  Denny Levy MD  December 06, 2008 4:57 PM   Follow-up for Phone Call        pt notified Follow-up by: Alphia Kava,  December 07, 2008 10:08 AM    New/Updated Medications: OXYBUTYNIN CHLORIDE 5 MG TABS (OXYBUTYNIN CHLORIDE) 1 by mouth qd Prescriptions: OXYBUTYNIN CHLORIDE 5 MG TABS (OXYBUTYNIN CHLORIDE) 1 by mouth qd  #30 x 5   Entered and Authorized by:   Denny Levy MD   Signed by:   Denny Levy MD on 12/06/2008   Method used:   Electronically to        [facility]  [address] [id number] (retail)       [address]       [place], [place]  [address]       Ph: [phone] or [phone]       Fax: [phone]   RxID:   [id number]

## 2010-05-14 NOTE — Progress Notes (Signed)
Summary: wants meds  Phone Note Call from Patient Call back at [phone]   Caller: Patient Summary of Call: wants some muscle relaxer b/c she has a bad neck- slept in hosp chair for 2 nights [facility] Initial call taken by: De Nurse,  December 25, 2009 8:32 AM  Follow-up for Phone Call        told her she would need an appt. she does not want to see any other md. has appt weeks from now. states she will see how it goes. to pcp Follow-up by: Golden Circle RN,  December 25, 2009 8:44 AM  Additional Follow-up for Phone Call Additional follow up Details #1::        Kennon Rounds we can give her a SHORT course of muscle relaer as bleow Thanks!  Denny Levy MD  December 25, 2009 9:58 AM     Additional Follow-up for Phone Call Additional follow up Details #2::    pt informed Follow-up by: Golden Circle RN,  December 25, 2009 10:06 AM  New/Updated Medications: FLEXERIL 5 MG TABS (CYCLOBENZAPRINE HCL) 1 by mouth at bedtime for 5 nights as needed neck and back pain Prescriptions: FLEXERIL 5 MG TABS (CYCLOBENZAPRINE HCL) 1 by mouth at bedtime for 5 nights as needed neck and back pain  #5 x 0   Entered and Authorized by:   Denny Levy MD   Signed by:   Denny Levy MD on 12/25/2009   Method used:   Electronically to        [facility] [address] [phone]* (retail)       [address]       [place], [place]  [address]       Ph: [phone] or [phone]       Fax: [phone]   RxID:   [id number]

## 2010-05-14 NOTE — Progress Notes (Signed)
Summary: needs meds  Phone Note Call from Patient Call back at [phone]   Caller: Patient Summary of Call: had a death in family that was real close to her and needs to know if she can get some xanax to get thru [facility] Initial call taken by: De Nurse,  September 20, 2009 8:44 AM  Follow-up for Phone Call        her nephew died. told her I will send this to her md & will call her back with response Follow-up by: Golden Circle RN,  September 20, 2009 9:13 AM  Additional Follow-up for Phone Call Additional follow up Details #1::        triage Ok plz call in rx as below plz tell her this is max amount I give for this kind of thing Thanks!  Denny Levy MD  September 20, 2009 9:43 AM     Additional Follow-up for Phone Call Additional follow up Details #2::    done Follow-up by: Golden Circle RN,  September 20, 2009 9:52 AM  New/Updated Medications: ALPRAZOLAM 0.5 MG TABS (ALPRAZOLAM) 1 tab by mouth two times a day as needed anxiety Prescriptions: ALPRAZOLAM 0.5 MG TABS (ALPRAZOLAM) 1 tab by mouth two times a day as needed anxiety  #10 x 0   Entered and Authorized by:   Denny Levy MD   Signed by:   Denny Levy MD on 09/20/2009   Method used:   Telephoned to ...       [facility] [address] [phone]* (retail)       [address]       [place], [place]  [address]       Ph: [phone] or [phone]       Fax: [phone]   RxID:   [id number]   Appended Document: needs meds above Rx called to pharmacy.

## 2010-05-14 NOTE — Letter (Signed)
Summary: Bone density Letter  [facility]  [address]   [place], [place] [address]   Phone: [phone]  Fax: [phone]    02/28/2009  [facility] [address] [place], [place]  [address]  Dear Ms. Tostenson,   Your bone density scan looked good. Your bone loss is mild. It is not at the point we would need to do anything other thah make sure you are getting 1500 mg of calcium and 400-800 units of vitamin D. I would probably repeat this test in 1-2 years.        Sincerely,   Denny Levy MD

## 2010-05-14 NOTE — Assessment & Plan Note (Signed)
Summary: B12   Nurse Visit    Prior Medications: HYDROCHLOROTHIAZIDE 25 MG  TABS (HYDROCHLOROTHIAZIDE) 1 by mouth once daily AMLODIPINE BESYLATE 10 MG  TABS (AMLODIPINE BESYLATE) 1 by mouth qd SIMVASTATIN 40 MG  TABS (SIMVASTATIN) 1 by mouth once daily BUPROPION HCL 100 MG  TB12 (BUPROPION HCL) 1 by mouth two times a day POTASSIUM CHLORIDE 20 MEQ  PACK (POTASSIUM CHLORIDE) 1 by mouth qd ANACIN 81 MG  TBEC (ASPIRIN) once daily METFORMIN HCL 850 MG  TABS (METFORMIN HCL) 1 by mouth once daily to replace actos met CLONIDINE HCL 0.2 MG TABS (CLONIDINE HCL) 1/2 tab  by mouth twice a day IBUPROFEN 800 MG TABS (IBUPROFEN) 1 by mouth two times a day or three times a day as needed pain AMBIEN 10 MG  TABS (ZOLPIDEM TARTRATE) 1 by mouth qhs VITAMIN B 12 INJECTION () 1000 micrograms q moth IM ULTRAMETER STRIPS () disp box checks qd LANCETS () use as directed testing once daily disp box Current Allergies: LISINOPRIL (LISINOPRIL)    Medication Administration  Injection # 1:    Medication: Vit B12 1000 mcg    Diagnosis: OTHER ABNORMAL BLOOD CHEMISTRY (ICD-790.6)    Route: IM    Site: R deltoid    Exp Date: 10/2009    Lot #: [id number]    Mfr: American Regent    Patient tolerated injection without complications    Given by: Alphia Kava (January 10, 2008 9:11 AM)  Orders Added: 1)  Vit B12 1000 mcg [J3420] 2)  Admin of Therapeutic Inj  intramuscular or subcutaneous [96372]   Complete Medication List: 1)  Hydrochlorothiazide 25 Mg Tabs (Hydrochlorothiazide) .Marland Kitchen.. 1 by mouth once daily 2)  Amlodipine Besylate 10 Mg Tabs (Amlodipine besylate) .Marland Kitchen.. 1 by mouth qd 3)  Simvastatin 40 Mg Tabs (Simvastatin) .Marland Kitchen.. 1 by mouth once daily 4)  Bupropion Hcl 100 Mg Tb12 (Bupropion hcl) .Marland Kitchen.. 1 by mouth two times a day 5)  Potassium Chloride 20 Meq Pack (Potassium chloride) .Marland Kitchen.. 1 by mouth qd 6)  Anacin 81 Mg Tbec (Aspirin) .... Once daily 7)  Metformin Hcl 850 Mg Tabs (Metformin hcl) .Marland Kitchen.. 1 by mouth once  daily to replace actos met 8)  Clonidine Hcl 0.2 Mg Tabs (Clonidine hcl) .... 1/2 tab  by mouth twice a day 9)  Ibuprofen 800 Mg Tabs (Ibuprofen) .Marland Kitchen.. 1 by mouth two times a day or three times a day as needed pain 10)  Ambien 10 Mg Tabs (Zolpidem tartrate) .Marland Kitchen.. 1 by mouth qhs 11)  Vitamin B 12 Injection  .Marland Kitchen.. 1000 micrograms q moth im 12)  Ultrameter Strips  .... Disp box checks qd 13)  Lancets  .... Use as directed testing once daily disp box    ]

## 2010-05-14 NOTE — Assessment & Plan Note (Signed)
Summary: refill request  Prescriptions: ACTOPLUS MET 15-500 MG  TABS (PIOGLITAZONE HCL-METFORMIN HCL) 1 tab by mouth two times a day  #60 x 6   Entered by:   Jackalyn Lombard MD   Authorized by:   Denny Levy MD   Signed by:   Jackalyn Lombard MD on 01/05/2007   Method used:   Electronically [facility] to ...       [id number] [address]       [address]       [place], [place]  [address]       Ph: [phone] or [phone]       Fax: [phone]   RxID:   [id number]  Renew Actoplus met (pioglitazone-metformin) Tablet 15-500 mg [Requested As: ACTOPLUS MET 15 MG-500 MG TTAK] TAKE 1 TABLET BY MOUTH TWICE A DAY Disp. 60 tablet  (Requested: 6) (Last Fill: 11/14/2006)

## 2010-05-14 NOTE — Assessment & Plan Note (Signed)
Summary: f/u meds,df   Vital Signs:  Patient profile:   65 year old female Height:      63.5 inches Weight:      18201 pounds BMI:     3185.06 Temp:     98.7 degrees F oral Pulse rate:   93 / minute BP sitting:   170 / 83  (left arm)  Vitals Entered By: Gladstone Pih (July 11, 2009 9:24 AM)  CC: F/U Is Patient Diabetic? Yes Did you bring your meter with you today? No Pain Assessment Patient in pain? no        Primary Care Provider:  Denny Levy MD  CC:  F/U.  History of Present Illness: Diabetes follow up with blood sugars in    good control,   no episodes of low blood sugar. Taking medicines regularly and having no problems with them.  HAS NOTICED A TREMOR--BOTH IN HER HANDS AND OF HER LIPS. SEEMS TO BE GETTING WORSE OVER LAST FEW MONTHS.  aLSO LEFT KNEE PAIN--SIMILAR TO WHAT SHE HAD IN RIGHT KNEE PRIOR TO tkr  Habits & Providers  Alcohol-Tobacco-Diet     Tobacco Status: never  Current Medications (verified): 1)  Hydrochlorothiazide 25 Mg  Tabs (Hydrochlorothiazide) .Marland Kitchen.. 1 By Mouth Once Daily 2)  Amlodipine Besylate 10 Mg  Tabs (Amlodipine Besylate) .Marland Kitchen.. 1 By Mouth Qd 3)  Simvastatin 40 Mg  Tabs (Simvastatin) .Marland Kitchen.. 1 By Mouth Once Daily 4)  Klor-Con M20 20 Meq Cr-Tabs (Potassium Chloride Crys Cr) .... 2 By Mouth Qd 5)  Anacin 81 Mg  Tbec (Aspirin) .... Once Daily 6)  Metformin Hcl 850 Mg  Tabs (Metformin Hcl) .Marland Kitchen.. 1 By Mouth Once Daily To Replace Actos Met 7)  Clonidine Hcl 0.2 Mg Tabs (Clonidine Hcl) .Marland Kitchen.. 1 By Mouth Qhs 8)  Ibuprofen 800 Mg Tabs (Ibuprofen) .Marland Kitchen.. 1 By Mouth Two Times A Day or Three Times A Day As Needed Pain 9)  Trazodone Hcl 50 Mg Tabs (Trazodone Hcl) .... 2 By Mouth Qhs 10)  Vitamin B 12 Injection .Marland Kitchen.. 1000 Micrograms Q Moth Im 11)  Ultrameter Strips .... Disp Box Checks Qd 12)  Lancets .... Use As Directed Testing Once Daily Disp Box 13)  Vicodin 5-500 Mg Tabs (Hydrocodone-Acetaminophen) .Marland Kitchen.. 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 Of knees 2006 - 02/10/2006 R TKR 08/2008  Physical Exam  General:  alert and well-developed.   Mouth:  lower lip tremor at rest Neck:  supple, full ROM, and no masses.   Lungs:  normal breath sounds and no wheezes.   Heart:  normal rate, regular rhythm, and no murmur.   Abdomen:  soft and non-tender.   Msk:  left knee medial joint line tenderness. lacj=ks full extension by 5 degrees. no effusion. ligamentously intact calf is soft Neurologic:  alert & oriented X3, cranial nerves II-XII intact, strength normal in all extremities, gait normal, and DTRs symmetrical and normal.  tremor B hands--action tremor   Impression & Recommendations:  Problem # 1:  TREMOR (ICD-781.0)  Orders: [facility]- Est  Level 4 ([address]) Looking back she started and increased her oxybutinen as well as added occasional phenergan in last few moths so we will stop both of those and then have her f/u 3 weeks. I think this is med related but will check some basic labs today as well  Problem # 2:  OSTEOARTHRITIS, KNEE (ICD-715.96)  Orders: Injection, large joint- [facility] ([address]) [facility]- Est  Level 4 ([address]) injection today  Problem # 3:  DIABETES MELLITUS, TYPE II (ICD-250.00)  Orders: A1C-[facility] (9[address]) [facility]- Est  Level 4  ([address]) good control  Complete Medication List: 1)  Hydrochlorothiazide 25 Mg Tabs (Hydrochlorothiazide) .Marland Kitchen.. 1 by mouth once daily 2)  Amlodipine Besylate 10 Mg Tabs (Amlodipine besylate) .Marland Kitchen.. 1 by mouth qd 3)  Simvastatin 40 Mg Tabs (Simvastatin) .Marland Kitchen.. 1 by mouth once daily 4)  Klor-con M20 20 Meq Cr-tabs (Potassium chloride crys cr) .... 2 by mouth qd 5)  Anacin 81 Mg Tbec (Aspirin) .... Once daily 6)  Metformin Hcl 850 Mg Tabs (Metformin hcl) .Marland Kitchen.. 1 by mouth once daily to replace actos met 7)  Clonidine Hcl 0.2 Mg Tabs (Clonidine hcl) .Marland Kitchen.. 1 by mouth qhs 8)  Ibuprofen 800 Mg Tabs (Ibuprofen) .Marland Kitchen.. 1 by mouth two times a day or three times a day as needed pain 9)  Trazodone Hcl 50 Mg Tabs (Trazodone hcl) .... 2 by mouth qhs 10)  Vitamin B 12 Injection  .Marland Kitchen.. 1000 micrograms q moth im 11)  Ultrameter Strips  .... Disp box checks qd 12)  Lancets  .... Use as directed testing once daily disp box 13)  Vicodin 5-500 Mg Tabs (Hydrocodone-acetaminophen) .Marland Kitchen.. 1-2 by mouth at bedtime as needed back pain  Other Orders: Comp Met-[facility] ([address]-57846) CBC-[facility] (96[address]) TSH-[facility] (284[address]-24401) Vit B12 1000 mcg (U2725) Prescriptions: METFORMIN HCL 850 MG  TABS (METFORMIN HCL) 1 by mouth once daily to replace actos met  #30 Tablet x 11   Entered and Authorized by:   Denny Levy MD   Signed by:   Denny Levy MD on 07/11/2009   Method used:   Electronically to        [facility] [address] [phone]* (retail)       [address]       [place], [place]  [address]       Ph: [phone] or [phone]       Fax: [phone]   RxID:   [id number] KLOR-CON M20 20 MEQ CR-TABS (POTASSIUM CHLORIDE CRYS CR) 2 by mouth qd  #60 x 12   Entered and Authorized by:   Denny Levy MD   Signed by:   Denny Levy MD on 07/11/2009   Method used:   Electronically to        [facility] [address] [phone]* (retail)       [address]       [place], [place]  [address]       Ph:  [phone] or [phone]       Fax: [phone]   RxID:   [id number]   Laboratory Results   Blood Tests   Date/Time Received: July 11, 2009 9:52 AM  Date/Time Reported: July 11, 2009 9:52 AM   HGBA1C: 5.6%   (Normal Range: Non-Diabetic - 3-6%   Control Diabetic - 6-8%)  Comments: ..............Marland Kitchen  test performed by......Marland KitchenBonnie A. [place]az[place], MLS (ASCP)cm     Prevention & Chronic Care Immunizations   Influenza vaccine: Fluvax Non-MCR  (01/24/2009)   Influenza vaccine due: 01/04/2009    Tetanus booster: 08/13/2003: Done.   Tetanus booster due: 08/12/2013    Pneumococcal vaccine: Pneumovax  (01/29/2007)   Pneumococcal vaccine due: None    H. zoster vaccine: Not documented  Colorectal Screening   Hemoccult: had colonoscopy not indicated  (04/11/2008)   Hemoccult due: Not Indicated    Colonoscopy: Done.  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 in 1-2 years dry eyes--rx restasis and artificial tears Dr [facility] [phone]  (04/05/2008)   Eye exam due: 04/05/2009    Foot exam: Not documented   Foot exam action/deferral: Do today   High risk foot: Not documented   Foot care education: Not documented    Urine microalbumin/creatinine ratio: Not documented   Urine microalbumin action/deferral: Ordered    Diabetes flowsheet reviewed?: Yes   Progress toward A1C goal: At goal  Lipids   Total Cholesterol: 155  (04/13/2008)   LDL: 72   (04/13/2008)   LDL Direct: 103  (01/24/2009)   HDL: 36  (04/13/2008)   Triglycerides: 235  (04/13/2008)   Lipid panel due: 03/02/2009    SGOT (AST): 16  (01/24/2009)   SGPT (ALT): 11  (01/24/2009) CMP ordered    Alkaline phosphatase: 69  (01/24/2009)   Total bilirubin: 0.5  (01/24/2009)   Liver panel due: 03/02/2009    Lipid flowsheet reviewed?: Yes   Progress toward LDL goal: At goal  Hypertension   Last Blood Pressure: 170 / 83  (07/11/2009)   Serum creatinine: 1.00  (01/24/2009)   Serum potassium 3.6  (01/24/2009) CMP ordered     Hypertension flowsheet reviewed?: Yes   Progress toward BP goal: Deteriorated  Self-Management Support :   Personal Goals (by the next clinic visit) :     Personal A1C goal: 7  (01/24/2009)     Personal blood pressure goal: 130/80  (01/24/2009)     Personal LDL goal: 100  (01/24/2009)    Diabetes self-management support: Not documented    Diabetes self-management support not done because: Good outcomes  (01/24/2009)    Hypertension self-management support: Not documented    Hypertension self-management support not done because: Good outcomes  (02/14/2009)    Lipid self-management support: Not documented     Lipid self-management support not done because: Good outcomes  (02/14/2009)    Medication Administration  Injection # 1:    Medication: Vit B12 1000 mcg    Diagnosis: FATIGUE, ACUTE (ICD-780.79)    Route: IM    Site: R deltoid    Exp Date: 02/13/2011    Lot #: [id number]    Mfr: American Regent    Patient tolerated injection without complications    Given by: Loralee Pacas CMA (July 11, 2009 10:27 AM)  Orders Added: 1)  A1C-[facility] [83036] 2)  Comp Met-[facility] [84132-44010] 3)  CBC-[facility] [27253]  4)  TSH-[facility] [[address]] 5)  Vit B12 1000 mcg [J3420] 6)  Injection, large joint- [facility] [20610] 7)  [facility]- Est  Level 4 [09811]

## 2010-05-14 NOTE — Progress Notes (Signed)
Summary: FYI   Phone Note Call from Patient   Caller: Patient Summary of Call: having knee replacement on Tuesday April 27-  just wanted to let Dr know. Initial call taken by: De Nurse,  August 03, 2008 10:25 AM

## 2010-05-14 NOTE — Assessment & Plan Note (Signed)
Summary: bladder inf.   Vital Signs:  Patient profile:   65 year old female Weight:      182.5 pounds Temp:     98.1 degrees F oral Pulse rate:   103 / minute BP sitting:   142 / 83  (left arm)  Vitals Entered By: Alphia Kava (September 20, 2008 11:50 AM) CC: frequency Is Patient Diabetic? Yes   Primary Care Mozes Sagar:  Denny Levy MD  CC:  frequency.  History of Present Illness: WEAKNESS: had TKR surgery and since getting home has had nausea, severe fatigue. nausea with any food odors, esp meat. Not feeling like she can eat much more han a little toast or potatoes at a time.nausea worst first thing in am No chest pain or DOE or SOb. Has lost 20 lbs since before surgery.  went back on her wellbutrin--said she was just too stressed without it. She has done well w the knee surgery and is doing really well w her PT but some increased social stressors at home--husband seems more irritable than beofre she had tkr. Feels like the first time she "did somnething for myself" he got mor eirritable.  Also having urinary frequency but has not been able to get a urine for [place] this am. Has a lot of urgency  Habits & Providers  Alcohol-Tobacco-Diet     Tobacco Status: never  Allergies: 1)  Lisinopril (Lisinopril)  Past History:  Past Surgical History: Cholecystectomy - 03/14/1988,  colonoscopy--diverticulosis - 10/12/2005 c-section - 06/13/1983 esophageal dilation - 07/05/2003 ETT  neg, fair fitness - 08/12/2005 with neg cardiolite May 2008 Hysterectomy - ovaries remain 06/12/1997,  viscosup. Of knees 2006 - 02/10/2006 R TKR 08/2008  Physical Exam  Neck:  supple, full ROM, no masses, and no thyromegaly.   Lungs:  normal respiratory effort and normal breath sounds.   Heart:  normal rate, regular rhythm, and no murmur.   Abdomen:  soft, non-tender, normal bowel sounds, no distention, and no masses.   Msk:  R TKR incision healing well w no sign of infection Neurologic:  alert & oriented X3.      Impression & Recommendations:  Problem # 1:  FATIGUE, ACUTE (ICD-780.79)  Orders: Comp Met-[facility] ([address]) A1C-[facility] (09811) CBC-[facility] (91478) TSH-[facility] ([address]86) [facility]- Est  Level 4 (57[address]) recheck some labs on her may be related to her nausea and poor food intake  Problem # 2:  NAUSEA AND VOMITING (ICD-787.01)  unclear etiooogy--will try nausea suppression w at bedtime phenergan as am seem sto be the worst time f/u 2 weeks  Orders: [facility]- Est  Level 4 ([address])  Problem # 3:  DYSURIA (ICD-788.1)  Her updated medication list for this problem includes:    Cipro 250 Mg Tabs (Ciprofloxacin hcl) .Marland Kitchen... 1 by mouth id for 7 days not clear to me that she has uti and she is unable to give [place] a urine sample. will do 3 day tx and f/u 2 weeks as planned  Orders: [facility]- Est  Level 4 ([address])  Complete Medication List: 1)  Hydrochlorothiazide 25 Mg Tabs (Hydrochlorothiazide) .Marland Kitchen.. 1 by mouth once daily 2)  Amlodipine Besylate 10 Mg Tabs (Amlodipine besylate) .Marland Kitchen.. 1 by mouth qd 3)  Simvastatin 40 Mg Tabs (Simvastatin) .Marland Kitchen.. 1 by mouth once daily 4)  Potassium Chloride 20 Meq Pack (Potassium chloride) .... 2 by mouth once daily 5)  Anacin 81 Mg Tbec (Aspirin) .... Once daily 6)  Metformin Hcl 850 Mg Tabs (Metformin hcl) .Marland Kitchen.. 1 by mouth once daily  to replace actos met 7)  Clonidine Hcl 0.2 Mg Tabs (Clonidine hcl) .Marland Kitchen.. 1 by mouth qhs 8)  Ibuprofen 800 Mg Tabs (Ibuprofen) .Marland Kitchen.. 1 by mouth two times a day or three times a day as needed pain 9)  Trazodone Hcl 50 Mg Tabs (Trazodone hcl) .... 2 by mouth qhs 10)  Vitamin B 12 Injection  .Marland Kitchen.. 1000 micrograms q moth im 11)  Ultrameter Strips  .... Disp box checks qd 12)  Lancets  .... Use as directed testing once daily disp box 13)  Cipro 250 Mg Tabs (Ciprofloxacin hcl) .Marland Kitchen.. 1 by mouth id for 7 days 14)  Promethazine Hcl 12.5 Mg Tabs (Promethazine hcl) .Marland Kitchen.. 1 by mouth at bedtime for nausea  Patient Instructions: 1)  I am calling in some phenergan  tablets for nausea. for the next 2 weeks, take one before you go to bed at night. 2)  I am also refilling your antibiotic for a urinary tract infection--it will be one tablet of cipro twice a day fo three days. 3)  We will HOLD your simvastatin for two weeks and decrease your potassium to one tab a day to see if we can help w the nausea 4)  let me see you in two weeks Prescriptions: PROMETHAZINE HCL 12.5 MG TABS (PROMETHAZINE HCL) 1 by mouth at bedtime for nausea  #21 x 0   Entered and Authorized by:   Denny Levy MD   Signed by:   Denny Levy MD on 09/20/2008   Method used:   [facility]        [facility]  [address] [id number] ([facility]ail)       [address]       [place], [place]  [address]       Ph: [phone] or [phone]       Fax: [phone]   RxID:   [id number]   Laboratory Results   Blood Tests   Date/Time Received: September 20, 2008 12:07 PM  Date/Time Reported: September 20, 2008 2:09 PM   HGBA1C: 5.4%   (Normal Range: Non-Diabetic - 3-6%   Control Diabetic - 6-8%)  Comments: ...............test performed by......Marland KitchenBonnie A. [place], [place] (ASCP)

## 2010-05-16 NOTE — Progress Notes (Signed)
Summary: Rx/appt for b12  Phone Note Call from Patient Call back at [phone]   Reason for Call: Talk to Nurse Summary of Call: seen yesterday, forgot to get rx for her dm testing strips, also wanted to get a b12 shot, if MD approved please route to scheduler so we can make appt.  Initial call taken by: Knox Royalty,  April 18, 2010 11:55 AM  Follow-up for Phone Call        DEAR WHITE TEAM ok what kind of diabetes test strips (what kind of meter) does she have, does she want written rx? and OK to get of B 12 Im Thanks!  Denny Levy MD  April 23, 2010 2:13 PM   Additional Follow-up for Phone Call Additional follow up Details #1::        called pt to see what type of meter she has and did not get answer no answering service set up to lvm. will try again tomorrow   Additional Follow-up by: Loralee Pacas CMA,  April 23, 2010 6:02 PM    Additional Follow-up for Phone Call Additional follow up Details #2::    pt has a one touch ultra mini  and [facility] Follow-up by: Loralee Pacas CMA,  April 24, 2010 11:56 AM  handwritten and faxed

## 2010-05-16 NOTE — Miscellaneous (Signed)
  Prescriptions: ONETOUCH ULTRA MINI TEST STRIPS Use as directed  #100 x 0   Entered by:   Rochele Pages RN   Authorized by:   Denny Levy MD   Signed by:   Rochele Pages RN on 04/26/2010   Method used:   Faxed to ...       [facility] [address] [phone]* (retail)       [address]       [place], [place]  [address]       Ph: [phone] or [phone]       Fax: [phone]   RxID:   [id number]

## 2010-05-16 NOTE — Assessment & Plan Note (Signed)
Summary: F/U VISIT/[facility]   Vital Signs:  Patient profile:   64 year old female Weight:      182 pounds Temp:     98.4 degrees F oral Pulse rate:   79 / minute BP sitting:   156 / 73  (right arm) Cuff size:   regular  Vitals Entered By: Loralee Pacas CMA (April 17, 2010 10:24 AM) Is Patient Diabetic? Yes Did you bring your meter with you today? No Pain Assessment Patient in pain? no        Primary Care Provider:  Denny Levy MD   History of Present Illness: 1) f/u tremor--has just gotten worse. she is now ready to go to neurologist. tremor worse fiorst thing in am--there all of the time. Most notablein hands, mouth. No gait problems  2)  knee and 3) back pain much worse--is not sure she wants to pursue another TKR at this point. Pain is 6/10 all day in her knee and 4-6 /10 in her back.  back pain worse some days than others---knee pain same allof the time. No falls. Backpain non radiating below lumbar area. No incontinence  4)Follow up hypertension. Taking medicines regularly with no problems. Not having any any headaches or chest pains.     Habits & Providers  Alcohol-Tobacco-Diet     Tobacco Status: never  Exercise-Depression-Behavior     Have you felt down or hopeless? no     Have you felt little pleasure in things? yes     Depression Counseling: not indicated; screening negative for depression     Seat Belt Use: always  Current Medications (verified): 1)  Hydrochlorothiazide 25 Mg  Tabs (Hydrochlorothiazide) .Marland Kitchen.. 1 By Mouth Once Daily 2)  Amlodipine Besylate 10 Mg  Tabs (Amlodipine Besylate) .Marland Kitchen.. 1 By Mouth Qd 3)  Simvastatin 40 Mg  Tabs (Simvastatin) .Marland Kitchen.. 1 By Mouth Once Daily 4)  Klor-Con M20 20 Meq Cr-Tabs (Potassium Chloride Crys Cr) .... 2 By Mouth Qd 5)  Anacin 81 Mg  Tbec (Aspirin) .... Once Daily 6)  Metformin Hcl 850 Mg  Tabs (Metformin Hcl) .Marland Kitchen.. 1 By Mouth Once Daily To Replace Actos Met 7)  Clonidine Hcl 0.2 Mg Tabs (Clonidine Hcl) .Marland Kitchen.. 1 By Mouth  Qhs 8)  Ibuprofen 800 Mg Tabs (Ibuprofen) .Marland Kitchen.. 1 By Mouth Two Times A Day or Three Times A Day As Needed Pain 9)  Trazodone Hcl 50 Mg Tabs (Trazodone Hcl) .... 2 By Mouth Qhs 10)  Vitamin B 12 Injection .Marland Kitchen.. 1000 Micrograms Q Moth Im 11)  Ultrameter Strips .... Disp Box Checks Qd 12)  Lancets .... Use As Directed Testing Once Daily Disp Box 13)  Vicodin 5-500 Mg Tabs (Hydrocodone-Acetaminophen) .Marland Kitchen.. 1-2 By Mouth Three Times A Day As Needed 14)  Flexeril 5 Mg Tabs (Cyclobenzaprine Hcl) .Marland Kitchen.. 1 By Mouth At Bedtime For 5 Nights As Needed Neck and Back Pain  Allergies: 1)  Lisinopril (Lisinopril)  Social History: Risk analyst Use:  always  Review of Systems Neuro:  Complains of disturbances in coordination, poor balance, tremors, and weakness; denies brief paralysis, inability to speak, numbness, and sensation of room spinning.  Physical Exam  General:  alert, well-developed, well-nourished, and well-hydrated.   Eyes:  eyelids seem a little dropy but full active closure and opening Mouth:  oromotor movements at rest Neck:  supple, full ROM, no masses, no thyromegaly, and no carotid bruits.   Lungs:  normal breath sounds.   Heart:  normal rate, regular rhythm, and no murmur.  Neurologic:  resting tremor hands worsens with activity. resting mouth tremor. gait is pretty normal. alert & oriented X3 and strength normal in all extremities.     Impression & Recommendations:  Problem # 1:  TREMOR (ICD-781.0)  Orders: Neurology Referral (Neuro) [facility]- Est  Level 4 (04540) I think this is early parkinsons--she finally agrees to neurology referral and we will set up.  Problem # 2:  HYPERTENSION (ICD-401.9)  Her updated medication list for this problem includes:    Hydrochlorothiazide 25 Mg Tabs (Hydrochlorothiazide) .Marland Kitchen... 1 by mouth once daily    Amlodipine Besylate 10 Mg Tabs (Amlodipine besylate) .Marland Kitchen... 1 by mouth qd    Clonidine Hcl 0.2 Mg Tabs (Clonidine hcl) .Marland Kitchen... 1 by mouth qhs good  control  Orders: [facility]- Est  Level 4 (99214)  Problem # 3:  ARTHRITIS, BACK (ICD-721.90)  will increase pain med coverage  Orders: [facility]- Est  Level 4 (98119)  Problem # 4:  OSTEOARTHRITIS, KNEE (ICD-715.96)  will increase pain med coverage  Orders: [facility]- Est  Level 4 (14782)  Complete Medication List: 1)  Hydrochlorothiazide 25 Mg Tabs (Hydrochlorothiazide) .Marland Kitchen.. 1 by mouth once daily 2)  Amlodipine Besylate 10 Mg Tabs (Amlodipine besylate) .Marland Kitchen.. 1 by mouth qd 3)  Simvastatin 40 Mg Tabs (Simvastatin) .Marland Kitchen.. 1 by mouth once daily 4)  Klor-con M20 20 Meq Cr-tabs (Potassium chloride crys cr) .... 2 by mouth qd 5)  Anacin 81 Mg Tbec (Aspirin) .... Once daily 6)  Metformin Hcl 850 Mg Tabs (Metformin hcl) .Marland Kitchen.. 1 by mouth once daily to replace actos met 7)  Clonidine Hcl 0.2 Mg Tabs (Clonidine hcl) .Marland Kitchen.. 1 by mouth qhs 8)  Ibuprofen 800 Mg Tabs (Ibuprofen) .Marland Kitchen.. 1 by mouth two times a day or three times a day as needed pain 9)  Trazodone Hcl 50 Mg Tabs (Trazodone hcl) .... 2 by mouth qhs 10)  Vitamin B 12 Injection  .Marland Kitchen.. 1000 micrograms q moth im 11)  Ultrameter Strips  .... Disp box checks qd 12)  Lancets  .... Use as directed testing once daily disp box 13)  Vicodin 5-500 Mg Tabs (Hydrocodone-acetaminophen) .Marland Kitchen.. 1-2 by mouth three times a day as needed 14)  Flexeril 5 Mg Tabs (Cyclobenzaprine hcl) .Marland Kitchen.. 1 by mouth at bedtime for 5 nights as needed neck and back pain Prescriptions: VICODIN 5-500 MG TABS (HYDROCODONE-ACETAMINOPHEN) 1-2 by mouth three times a day as needed  #180 x 5   Entered and Authorized by:   Denny Levy MD   Signed by:   Denny Levy MD on 04/17/2010   Method used:   Print then Give to Patient   RxID:   [id number]    Orders Added: 1)  Neurology Referral [Neuro] 2)  [facility]- Est  Level 4 [29528]    Prevention & Chronic Care Immunizations   Influenza vaccine: Fluvax Non-MCR  (01/16/2010)   Influenza vaccine due: 01/04/2009    Tetanus booster: 08/13/2003: Done.    Tetanus booster due: 08/12/2013    Pneumococcal vaccine: Pneumovax  (01/29/2007)   Pneumococcal vaccine due: None    H. zoster vaccine: Not documented  Colorectal Screening   Hemoccult: had colonoscopy not indicated  (04/11/2008)   Hemoccult due: Not Indicated    Colonoscopy: Done.  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 lensectomy in 1-2 years dry eyes--rx restasis and artificial tears Dr [facility] [phone]  (04/05/2008)   Eye exam due: 04/05/2009    Foot exam: Not documented   Foot exam action/deferral: Do today   High risk foot: Not documented   Foot care education: Not documented    Urine microalbumin/creatinine ratio: Not documented   Urine microalbumin action/deferral: Ordered  Lipids   Total Cholesterol: 155  (04/13/2008)   LDL: 72  (04/13/2008)   LDL Direct: 103  (01/24/2009)   HDL: 36  (04/13/2008)   Triglycerides: 235  (04/13/2008)   Lipid panel due: 03/02/2009    SGOT (AST): 24  (01/16/2010)   SGPT (ALT): 18  (01/16/2010)   Alkaline phosphatase: 66  (01/16/2010)   Total bilirubin: 0.4  (01/16/2010)   Liver panel due: 03/02/2009  Hypertension   Last Blood Pressure: 156 / 73  (04/17/2010)   Serum creatinine: 0.89  (01/16/2010)   Serum potassium 3.8  (01/16/2010)  Self-Management Support :   Personal Goals (by the next clinic visit) :     Personal A1C goal: 7  (01/24/2009)     Personal blood pressure goal: 130/80  (01/24/2009)     Personal LDL  goal: 100  (01/24/2009)    Diabetes self-management support: Not documented    Diabetes self-management support not done because: Good outcomes  (01/24/2009)    Hypertension self-management support: Not documented    Hypertension self-management support not done because: Good outcomes  (02/14/2009)    Lipid self-management support: Not documented     Lipid self-management support not done because: Good outcomes  (02/14/2009)

## 2010-05-16 NOTE — Assessment & Plan Note (Signed)
Summary: b12/eo  Nurse Visit   Allergies: 1)  Lisinopril (Lisinopril)  Medication Administration  Injection # 1:    Medication: Vit B12 1000 mcg    Diagnosis: TREMOR (ICD-781.0)    Route: IM    Site: L deltoid    Exp Date: 06/13    Lot #: [id number]    Mfr: American Regent    Patient tolerated injection without complications    Given by: Theresia Lo RN (April 29, 2010 8:47 AM)  Orders Added: 1)  Vit B12 1000 mcg [J3420] 2)  Admin of Injection (IM/SQ) [16109]   Medication Administration  Injection # 1:    Medication: Vit B12 1000 mcg    Diagnosis: TREMOR (ICD-781.0)    Route: IM    Site: L deltoid    Exp Date: 06/13    Lot #: [id number]    Mfr: American Regent    Patient tolerated injection without complications    Given by: Theresia Lo RN (April 29, 2010 8:47 AM)  Orders Added: 1)  Vit B12 1000 mcg [J3420] 2)  Admin of Injection (IM/SQ) [60454]

## 2010-05-30 ENCOUNTER — Other Ambulatory Visit: Payer: Self-pay | Admitting: Family Medicine

## 2010-05-31 NOTE — Telephone Encounter (Signed)
Refill request

## 2010-06-16 ENCOUNTER — Other Ambulatory Visit: Payer: Self-pay | Admitting: Family Medicine

## 2010-06-17 NOTE — Telephone Encounter (Signed)
Refill request

## 2010-06-18 ENCOUNTER — Other Ambulatory Visit: Payer: Self-pay | Admitting: Family Medicine

## 2010-06-18 NOTE — Telephone Encounter (Signed)
Refill request

## 2010-06-18 NOTE — Telephone Encounter (Signed)
Took two of her 500 mg doses of Metformin this am.  Was feeling shaky but feels fine now.  Checked a CBG a few minutes ago and it as 112.  Told her she was probably okay, just eat a good lunch and recheck a CBG this afternoon and if it was low to call us back.  Pt agreeable.

## 2010-06-18 NOTE — Telephone Encounter (Signed)
Pt took double dose of metformin, feels shaky wants to know what she can do?

## 2010-06-27 ENCOUNTER — Other Ambulatory Visit: Payer: Self-pay | Admitting: Diagnostic Neuroimaging

## 2010-06-27 DIAGNOSIS — G20 Parkinson's disease: Secondary | ICD-10-CM

## 2010-07-02 ENCOUNTER — Encounter: Payer: Self-pay | Admitting: Family Medicine

## 2010-07-05 ENCOUNTER — Ambulatory Visit
Admission: RE | Admit: 2010-07-05 | Discharge: 2010-07-05 | Disposition: A | Discharge status: Home / Self Care | Payer: Medicare HMO | Source: Ambulatory Visit | Attending: Diagnostic Neuroimaging | Admitting: Diagnostic Neuroimaging

## 2010-07-05 DIAGNOSIS — G20 Parkinson's disease: Secondary | ICD-10-CM

## 2010-07-08 ENCOUNTER — Other Ambulatory Visit: Payer: Self-pay | Admitting: Family Medicine

## 2010-07-08 NOTE — Telephone Encounter (Signed)
Refill request
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
## 2010-07-17 ENCOUNTER — Encounter: Payer: Self-pay | Admitting: Family Medicine

## 2010-07-17 ENCOUNTER — Ambulatory Visit (INDEPENDENT_AMBULATORY_CARE_PROVIDER_SITE_OTHER): Payer: Medicare HMO | Admitting: Family Medicine

## 2010-07-17 VITALS — BP 143/73 | HR 67 | Temp 98.1°F | Ht 64.0 in | Wt 168.7 lb

## 2010-07-17 DIAGNOSIS — IMO0002 Reserved for concepts with insufficient information to code with codable children: Secondary | ICD-10-CM

## 2010-07-17 DIAGNOSIS — E119 Type 2 diabetes mellitus without complications: Secondary | ICD-10-CM

## 2010-07-17 DIAGNOSIS — G20C Parkinsonism, unspecified: Secondary | ICD-10-CM

## 2010-07-17 DIAGNOSIS — G20 Parkinson's disease: Secondary | ICD-10-CM

## 2010-07-17 DIAGNOSIS — D649 Anemia, unspecified: Secondary | ICD-10-CM

## 2010-07-17 DIAGNOSIS — M1710 Unilateral primary osteoarthritis, unspecified knee: Secondary | ICD-10-CM

## 2010-07-17 DIAGNOSIS — K222 Esophageal obstruction: Secondary | ICD-10-CM

## 2010-07-17 LAB — POCT GLYCOSYLATED HEMOGLOBIN (HGB A1C): Hemoglobin A1C: 5.4

## 2010-07-17 MED ORDER — METFORMIN HCL 850 MG PO TABS: 850.0000 mg | ORAL_TABLET | Freq: Two times a day (BID) | ORAL | Status: DC

## 2010-07-17 MED ORDER — AMLODIPINE BESYLATE 10 MG PO TABS: 10.0000 mg | ORAL_TABLET | Freq: Every day | ORAL | Status: DC

## 2010-07-17 MED ORDER — CYANOCOBALAMIN 1000 MCG/ML IJ SOLN
1000.0000 ug | Freq: Once | INTRAMUSCULAR | Status: AC
  Administered 2010-07-17: 1000 ug via INTRAMUSCULAR

## 2010-07-17 NOTE — Assessment & Plan Note (Signed)
Feeling much better on sinemet. No side effects yet. More steady on her feet. We discussed parkinsons in general.

## 2010-07-17 NOTE — Assessment & Plan Note (Signed)
F/u DM. No problems. No epsidoes low sugar. Taking meds regularly. A1C pending

## 2010-07-17 NOTE — Patient Instructions (Signed)
Please stop at front desk and set up an appointment in th [facility] clinic at yoour convenience to have a biopsy of te arm lesion. I will have my nurse call you in next few days with an appointment for the stomach doctor.   Try to get the OPERATIVE report from you previous (last) esophagus surgery.  I will call in your refills  Please see me back in about 6-8 weeks.

## 2010-07-17 NOTE — Assessment & Plan Note (Signed)
Hx of some type esophageal surgery--now having return of similar sx. Surgery was many years ago in [place].

## 2010-07-17 NOTE — Assessment & Plan Note (Signed)
worsening left knee pain--wants shot.

## 2010-07-17 NOTE — Progress Notes (Signed)
  Subjective:    Patient ID: [name], female    DOB: 09-22-1945, 65 y.o.   MRN: [id number]  HPI  Details of HPI and A/P for chronic problems discussed at this visit are listed under individual problems (problem associated notes); Additionally: 1) lesion left arm--getiing bigger. Itchy. 2) needs knee shot for arthritis.  Review of Systems    Pertinent review of systems: negative for fever or unusual weight change.  Objective:   Physical Exam GENERALl: Well developed, well nourished, in no acute distress. NECK: Supple, FROM, without lymphadenopathy.  THYROID: normal without nodularity CAROTID ARTERIES: without bruits LUNGS: clear to auscultation bilaterally. No wheezes or rales. HEART: Regular rate and rhythm, no murmurs ABDOMEN: soft with positive bowel sounds NEURO: No gross focal deficits but still some resting tremor of hands and mouth. Left eye is no longer drooping.  EXT: left knee without erythema or warmth, no effusion.         Assessment & Plan:  Details of HPI and A/P for chronic problems discussed at this visit are listed under individual problems (problem associated notes); Additionally: 1) set up bx appt for arm lesion 2) will refer for GI eval--likely needs egd 3) injection today for arthrits

## 2010-07-18 ENCOUNTER — Encounter: Payer: Self-pay | Admitting: Family Medicine

## 2010-07-22 ENCOUNTER — Other Ambulatory Visit: Payer: Self-pay | Admitting: Family Medicine

## 2010-07-23 LAB — PROTIME-INR
INR: 2.2 — ABNORMAL HIGH (ref 0.00–1.49)
Prothrombin Time: 25.4 seconds — ABNORMAL HIGH (ref 11.6–15.2)

## 2010-07-23 LAB — BASIC METABOLIC PANEL
BUN: 10 mg/dL (ref 6–23)
CO2: 31 mEq/L (ref 19–32)
Calcium: 8.9 mg/dL (ref 8.4–10.5)
Chloride: 98 mEq/L (ref 96–112)
Creatinine, Ser: 0.76 mg/dL (ref 0.4–1.2)
GFR calc Af Amer: >60 mL/min (ref >60)
GFR calc non Af Amer: >60 mL/min (ref >60)
Glucose, Bld: 112 mg/dL — ABNORMAL HIGH (ref 70–99)
Potassium: 3.3 mEq/L — ABNORMAL LOW (ref 3.5–5.1)
Sodium: 138 mEq/L (ref 135–145)

## 2010-07-23 LAB — GLUCOSE, CAPILLARY: Glucose-Capillary: 89 mg/dL (ref 70–99)

## 2010-07-24 LAB — GLUCOSE, CAPILLARY
Glucose-Capillary: 113 mg/dL — ABNORMAL HIGH (ref 70–99)
Glucose-Capillary: 116 mg/dL — ABNORMAL HIGH (ref 70–99)
Glucose-Capillary: 117 mg/dL — ABNORMAL HIGH (ref 70–99)
Glucose-Capillary: 123 mg/dL — ABNORMAL HIGH (ref 70–99)
Glucose-Capillary: 125 mg/dL — ABNORMAL HIGH (ref 70–99)
Glucose-Capillary: 126 mg/dL — ABNORMAL HIGH (ref 70–99)
Glucose-Capillary: 127 mg/dL — ABNORMAL HIGH (ref 70–99)
Glucose-Capillary: 128 mg/dL — ABNORMAL HIGH (ref 70–99)
Glucose-Capillary: 131 mg/dL — ABNORMAL HIGH (ref 70–99)
Glucose-Capillary: 137 mg/dL — ABNORMAL HIGH (ref 70–99)
Glucose-Capillary: 137 mg/dL — ABNORMAL HIGH (ref 70–99)
Glucose-Capillary: 139 mg/dL — ABNORMAL HIGH (ref 70–99)
Glucose-Capillary: 141 mg/dL — ABNORMAL HIGH (ref 70–99)
Glucose-Capillary: 144 mg/dL — ABNORMAL HIGH (ref 70–99)
Glucose-Capillary: 148 mg/dL — ABNORMAL HIGH (ref 70–99)
Glucose-Capillary: 153 mg/dL — ABNORMAL HIGH (ref 70–99)
Glucose-Capillary: 178 mg/dL — ABNORMAL HIGH (ref 70–99)
Glucose-Capillary: 98 mg/dL (ref 70–99)

## 2010-07-24 LAB — URINALYSIS, ROUTINE W REFLEX MICROSCOPIC
Bilirubin Urine: NEGATIVE
Glucose, UA: NEGATIVE mg/dL
Glucose, UA: NEGATIVE mg/dL
Hgb urine dipstick: NEGATIVE
Hgb urine dipstick: NEGATIVE
Ketones, ur: 15 mg/dL — AB
Ketones, ur: 15 mg/dL — AB
Nitrite: NEGATIVE
Nitrite: NEGATIVE
Protein, ur: NEGATIVE mg/dL
Protein, ur: NEGATIVE mg/dL
Specific Gravity, Urine: 1.02 (ref 1.005–1.030)
Specific Gravity, Urine: 1.026 (ref 1.005–1.030)
Urobilinogen, UA: 0.2 mg/dL (ref 0.0–1.0)
Urobilinogen, UA: 1 mg/dL (ref 0.0–1.0)
pH: 5.5 (ref 5.0–8.0)
pH: 5.5 (ref 5.0–8.0)

## 2010-07-24 LAB — HEMOGLOBIN A1C
Hgb A1c MFr Bld: 6.1 % (ref 4.6–6.1)
Mean Plasma Glucose: 128 mg/dL

## 2010-07-24 LAB — CBC
HCT: 29.7 % — ABNORMAL LOW (ref 36.0–46.0)
HCT: 32.9 % — ABNORMAL LOW (ref 36.0–46.0)
HCT: 33.2 % — ABNORMAL LOW (ref 36.0–46.0)
HCT: 38.7 % (ref 36.0–46.0)
Hemoglobin: 10.4 g/dL — ABNORMAL LOW (ref 12.0–15.0)
Hemoglobin: 11.6 g/dL — ABNORMAL LOW (ref 12.0–15.0)
Hemoglobin: 11.6 g/dL — ABNORMAL LOW (ref 12.0–15.0)
Hemoglobin: 13.4 g/dL (ref 12.0–15.0)
MCHC: 34.8 g/dL (ref 30.0–36.0)
MCHC: 35.1 g/dL (ref 30.0–36.0)
MCHC: 35.1 g/dL (ref 30.0–36.0)
MCHC: 35.2 g/dL (ref 30.0–36.0)
MCV: 91.6 fL (ref 78.0–100.0)
MCV: 91.8 fL (ref 78.0–100.0)
MCV: 92.4 fL (ref 78.0–100.0)
MCV: 92.9 fL (ref 78.0–100.0)
Platelets: 258 10*3/uL (ref 150–400)
Platelets: 259 10*3/uL (ref 150–400)
Platelets: 270 10*3/uL (ref 150–400)
Platelets: 340 10*3/uL (ref 150–400)
RBC: 3.2 MIL/uL — ABNORMAL LOW (ref 3.87–5.11)
RBC: 3.59 MIL/uL — ABNORMAL LOW (ref 3.87–5.11)
RBC: 3.59 MIL/uL — ABNORMAL LOW (ref 3.87–5.11)
RBC: 4.21 MIL/uL (ref 3.87–5.11)
RDW: 12.2 % (ref 11.5–15.5)
RDW: 12.3 % (ref 11.5–15.5)
RDW: 12.5 % (ref 11.5–15.5)
RDW: 12.7 % (ref 11.5–15.5)
WBC: 12.1 10*3/uL — ABNORMAL HIGH (ref 4.0–10.5)
WBC: 7.5 10*3/uL (ref 4.0–10.5)
WBC: 9.3 10*3/uL (ref 4.0–10.5)
WBC: 9.6 10*3/uL (ref 4.0–10.5)

## 2010-07-24 LAB — BASIC METABOLIC PANEL
BUN: 5 mg/dL — ABNORMAL LOW (ref 6–23)
BUN: 5 mg/dL — ABNORMAL LOW (ref 6–23)
BUN: 8 mg/dL (ref 6–23)
CO2: 30 mEq/L (ref 19–32)
CO2: 32 mEq/L (ref 19–32)
CO2: 34 mEq/L — ABNORMAL HIGH (ref 19–32)
Calcium: 8.2 mg/dL — ABNORMAL LOW (ref 8.4–10.5)
Calcium: 8.8 mg/dL (ref 8.4–10.5)
Calcium: 8.9 mg/dL (ref 8.4–10.5)
Chloride: 101 mEq/L (ref 96–112)
Chloride: 96 mEq/L (ref 96–112)
Chloride: 99 mEq/L (ref 96–112)
Creatinine, Ser: 0.69 mg/dL (ref 0.4–1.2)
Creatinine, Ser: 0.74 mg/dL (ref 0.4–1.2)
Creatinine, Ser: 0.79 mg/dL (ref 0.4–1.2)
GFR calc Af Amer: >60 mL/min (ref >60)
GFR calc Af Amer: >60 mL/min (ref >60)
GFR calc Af Amer: >60 mL/min (ref >60)
GFR calc non Af Amer: >60 mL/min (ref >60)
GFR calc non Af Amer: >60 mL/min (ref >60)
GFR calc non Af Amer: >60 mL/min (ref >60)
Glucose, Bld: 123 mg/dL — ABNORMAL HIGH (ref 70–99)
Glucose, Bld: 131 mg/dL — ABNORMAL HIGH (ref 70–99)
Glucose, Bld: 139 mg/dL — ABNORMAL HIGH (ref 70–99)
Potassium: 3 mEq/L — ABNORMAL LOW (ref 3.5–5.1)
Potassium: 3 mEq/L — ABNORMAL LOW (ref 3.5–5.1)
Potassium: 3.3 mEq/L — ABNORMAL LOW (ref 3.5–5.1)
Sodium: 134 mEq/L — ABNORMAL LOW (ref 135–145)
Sodium: 139 mEq/L (ref 135–145)
Sodium: 140 mEq/L (ref 135–145)

## 2010-07-24 LAB — DIFFERENTIAL
Basophils Absolute: 0 10*3/uL (ref 0.0–0.1)
Basophils Relative: 1 % (ref 0–1)
Eosinophils Absolute: 0.1 10*3/uL (ref 0.0–0.7)
Eosinophils Relative: 1 % (ref 0–5)
Lymphocytes Relative: 34 % (ref 12–46)
Lymphs Abs: 2.5 10*3/uL (ref 0.7–4.0)
Monocytes Absolute: 0.7 10*3/uL (ref 0.1–1.0)
Monocytes Relative: 9 % (ref 3–12)
Neutro Abs: 4.2 10*3/uL (ref 1.7–7.7)
Neutrophils Relative %: 56 % (ref 43–77)

## 2010-07-24 LAB — URINE CULTURE: Colony Count: >=100000

## 2010-07-24 LAB — COMPREHENSIVE METABOLIC PANEL
ALT: 18 U/L (ref 0–35)
AST: 21 U/L (ref 0–37)
Albumin: 3.9 g/dL (ref 3.5–5.2)
Alkaline Phosphatase: 76 U/L (ref 39–117)
BUN: 11 mg/dL (ref 6–23)
CO2: 28 mEq/L (ref 19–32)
Calcium: 9.8 mg/dL (ref 8.4–10.5)
Chloride: 103 mEq/L (ref 96–112)
Creatinine, Ser: 1.17 mg/dL (ref 0.4–1.2)
GFR calc Af Amer: 57 mL/min — ABNORMAL LOW (ref >60)
GFR calc non Af Amer: 47 mL/min — ABNORMAL LOW (ref >60)
Glucose, Bld: 109 mg/dL — ABNORMAL HIGH (ref 70–99)
Potassium: 3.5 mEq/L (ref 3.5–5.1)
Sodium: 139 mEq/L (ref 135–145)
Total Bilirubin: 0.7 mg/dL (ref 0.3–1.2)
Total Protein: 6.5 g/dL (ref 6.0–8.3)

## 2010-07-24 LAB — URINE MICROSCOPIC-ADD ON

## 2010-07-24 LAB — PROTIME-INR
INR: 0.9 (ref 0.00–1.49)
INR: 1 (ref 0.00–1.49)
INR: 1.6 — ABNORMAL HIGH (ref 0.00–1.49)
INR: 2.5 — ABNORMAL HIGH (ref 0.00–1.49)
Prothrombin Time: 12.3 seconds (ref 11.6–15.2)
Prothrombin Time: 13.8 seconds (ref 11.6–15.2)
Prothrombin Time: 19.5 seconds — ABNORMAL HIGH (ref 11.6–15.2)
Prothrombin Time: 28.5 seconds — ABNORMAL HIGH (ref 11.6–15.2)

## 2010-07-24 LAB — ABO/RH: ABO/RH(D): O POS

## 2010-07-24 LAB — TYPE AND SCREEN
ABO/RH(D): O POS
Antibody Screen: NEGATIVE

## 2010-07-24 LAB — APTT: aPTT: 28 seconds (ref 24–37)

## 2010-08-01 ENCOUNTER — Ambulatory Visit: Payer: Medicare HMO

## 2010-08-01 ENCOUNTER — Telehealth: Payer: Self-pay | Admitting: Family Medicine

## 2010-08-01 DIAGNOSIS — K229 Disease of esophagus, unspecified: Secondary | ICD-10-CM

## 2010-08-01 NOTE — Telephone Encounter (Signed)
Dr. Jennette Kettle,  I did not see a referral for this pt for GI will you please put one in so that I can set up appt.Laureen Ochs, Viann Shove

## 2010-08-01 NOTE — Telephone Encounter (Signed)
Checking status of referral to the GI doctor. °

## 2010-08-12 ENCOUNTER — Encounter: Payer: Self-pay | Admitting: Family Medicine

## 2010-08-12 DIAGNOSIS — K229 Disease of esophagus, unspecified: Secondary | ICD-10-CM | POA: Insufficient documentation

## 2010-08-12 DIAGNOSIS — K224 Dyskinesia of esophagus: Secondary | ICD-10-CM | POA: Insufficient documentation

## 2010-08-12 HISTORY — DX: Disease of esophagus, unspecified: K22.9

## 2010-08-12 NOTE — Telephone Encounter (Signed)
Dear [facility] Team OOOOPS! I am SURE I did this but I cannot find it!!!! Darn EPIC! I am putting order in now and referral letter.THANKS!

## 2010-08-15 NOTE — Telephone Encounter (Signed)
Attempted to call patient to inform of GI appointment. Number was busy and the other two numbers do not accept incoming call per request.  Appointment is June 6th @ 9am with [facility]. Patient to call at least 48 hours in advance to cancel. [phone]. Address to practice is [address], [address]rd floor

## 2010-08-19 NOTE — Telephone Encounter (Signed)
Summary   Appointment set up with [facility] June 6th @ 9am with Dr. Arlyce Dice. Phone number is [phone] [address]

## 2010-08-19 NOTE — Telephone Encounter (Signed)
Please let this patient know that I have called in a medicine called QVAR for her asthma controller (instead of Flovent) because Medicaid will not approved Flovent at this time. She needs to take this new medicine morning and evening and follow instructions on the package about rinsing her mouth out after use.  Thanks   LVM for patient to call back to inform of below

## 2010-08-22 ENCOUNTER — Ambulatory Visit: Payer: Medicare HMO | Admitting: Family Medicine
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
## 2010-08-27 NOTE — Op Note (Signed)
NAMEJODETTE, [name]             ACCOUNT NO.:  [id number]   MEDICAL RECORD NO.:  [id number]          PATIENT TYPE:  INP   LOCATION:  5041                         FACILITY:  [facility]   PHYSICIAN:  [name], M.D.DATE OF BIRTH:  1945/09/01   DATE OF PROCEDURE:  08/08/2008  DATE OF DISCHARGE:                               OPERATIVE REPORT   PREOPERATIVE DIAGNOSIS:  End-stage osteoarthritis, right knee.   POSTOPERATIVE DIAGNOSIS:  End-stage osteoarthritis, right knee.   PROCEDURE:  Right total knee replacement.   SURGEON:  Mallory Manges. Cleophas Dunker, MD   ASSISTANT:  [name], P.A.-C.   ANESTHESIA:  General orotracheal with supplemental femoral nerve block.   COMPLICATIONS:  None.   COMPONENTS:  DePuy LCS standard femoral component #3 rotating keeled  tibial tray, a 12.5-mm bridging bearing, a 3-pegged metal-backed  rotating patella, all metal components were secured with polymethyl  methacrylate.   PROCEDURE:  The patient was comfortable on operating table and under  general orotracheal anesthesia, nursing staff inserted a Foley catheter.  Urine was clear.   Tourniquet was then applied to the left lower extremity, and the right  leg was then prepped with Betadine scrub and DuraPrep from the  tourniquet to the midfoot.  Sterile draping was performed.   With the extremity still elevated, it was Esmarch exsanguinated with the  proximal tourniquet at 350 mmHg.   A midline longitudinal incision was made centered about the patella  extending from the superior positive tibial tubercle via sharp  dissection.  Incision was carried down to subcutaneous tissue.  First  layer of capsule was incised in the midline.  A medial parapatellar  incision was then made with a Bovie.  The joint was entered.  There was  a minimal clear yellow joint effusion.   The patella was everted to 180 degrees and the knee flexed to 90  degrees.  There was a diffuse moderate synovitis.   Synovectomy was  performed.  Large osteophytes along the medial and lateral femoral  condyle and circumferentially above the patella.  These were removed as  well, so that we can template the prosthetic sizes.  There was  completely absence of articular cartilage along the medial femoral  condyle and tibial plateau.  There was a fixed varus position and a  medial release was performed along the medial tibia.   We had templated a standard femoral component preoperatively, this was  confirmed intraoperatively.  We also templated #3 tibial tray, which was  also confirmed intraoperatively.   First osteotomy was made on the proximal tibia using the external guide  with a postured angle of resection of 7 degrees.  At every stage of  osteotomy, we checked with the external guides.  Subsequent cuts were  then made on the femur using the standard template.  MCL and LCL remain  intact throughout the procedure.  Lamina spreader was inserted to remove  medial and lateral menisci as well as ACL and PCL.  A 10-mm flexion gaps  appear to be symmetrical throughout the procedure.  Final femoral cut  was then made to taper the distal  femur.  Osteophytes removed from the  posterior femoral condyles both medially and laterally.  There was a  large Baker cyst medially, which much of the wall was resected with the  Bovie.   Retractor was then placed about the tibia, which was advanced  anteriorly.  We measured a #3 tibial tray.  Template was then applied,  central hole was then made followed by the keeled cut.  The #3 tibial  tray was left in place.  We trailed a 10 and then a 12.5-mm bridging  bearing.  We felt that the 12.5 gave [place] more stability medially and  laterally with a negative anterior drawer sign.   The patella was repaired by removing 10 mm of bone, leaving 12 mm of  patellar thickness.  The 3 PEG template was applied.  Drill holes were  then made.  The trial patella was applied through a  full range of  motion, was perfectly stable.   All the trial components were removed.  The joint was copiously  irrigated with jet saline.   Retractors were then placed about the tibia.  The tibial tray was  applied with polymethyl methacrylate without antibiotic.  Extraneous  methacrylate was removed from its periphery.   The 12.5-mm polyethylene bridging bearing was then applied followed by  the cemented femoral component and a further extraneous methacrylate was  removed.  This knee was placed in extension.   The patella was applied with methacrylate and a patella clamp.  While  the methacrylate was maturing, we injected the deep capsule with  Marcaine with epinephrine.   After complete maturation of the methacrylate, the joint was inspected.  There was no further extraneous methacrylate.  Tourniquet was deflated  via medial capillary refill to the joint.  We did use FloSeal for  hemostasis.  After approximately 10 minutes, the FloSeal was gently  irrigated with bulb saline.  We had a nice dry field.   The deep capsule was closed with interrupted #1 Ethibond, superficial  capsule closed with the running 0 Vicryl subcu with 2-0 Vicryl, skin  closed with skin clips.  Sterile bulky dressing was applied followed by  the patient's support stocking.   The patient tolerated the procedure without complications.      Mallory Manges. Cleophas Dunker, M.D.  Electronically Signed     PWW/MEDQ  D:  08/08/2008  T:  08/08/2008  Job:  [id number]

## 2010-08-27 NOTE — Assessment & Plan Note (Signed)
[facility]                            CARDIOLOGY OFFICE NOTE   [name], [name]                    MRN:          [id number]  DATE:08/17/2006                            DOB:          24-Nov-1945    PRIMARY:  Dr. Albertha Ghee.   REASON FOR PRESENTATION:  Evaluate patient with chest and back pain.   HISTORY OF PRESENT ILLNESS:  Patient is a lovely 65 year old white  female with no prior cardiac history.  She has had back discomfort for  about a year.  She notices this with walking in particular.  She gets a  sharp discomfort between her shoulder blades.  She stops what she is  doing.  She sometimes changes position and she can improve this  discomfort.  It can be 7-10/10 in intensity.  It may last for several  minutes.  She thinks there has been a stable pattern over the past year.  She has also had some chest pressure.  She finds it difficult to  quantify and qualify this.  It may or may not happen with the back  discomfort.  It seems to happen more with emotional stress.  She did  have an exercise treadmill test last May.  She walked for 6 minutes.  She achieved 90% of her target heart rate and had no ischemic ST-T wave  changes.  She had a poor exercise tolerance.  She does have significant  cardiovascular risk factors.  She does not describe any new shortness of  breath and has had no PND or orthopnea.  She has not had any associated  symptoms such as nausea, vomiting or diaphoresis.  She may take a  Tylenol to try to improve this but otherwise stops what she is doing and  lets the pains go away.   PAST MEDICAL HISTORY:  1. Hypertension x20 years.  2. Diabetes mellitus x1 year.  3. Osteoarthritis.  4. Basal cell cancer.  5. Depression.   PAST SURGICAL HISTORY:  1. Hysterectomy.  2. C-section.  3. Cholecystectomy.  4. Esophageal stricture dilated.   ALLERGIES:  1. POLLEN CAUSES WATERY EYES.  2. SHE IS INTOLERANT OF ACE  INHIBITORS.   MEDICATIONS:  1. Aspirin 81 mg daily.  2. Actoplus MET 15/500 b.i.d.  3. Clonidine HCL 0.2 b.i.d.  4. Multivitamin.  5. Amlodipine 10 mg daily.  6. Klor-Con 20 mEq every other day.  7. Simvastatin 40 mg daily.  8. Hydrochlorothiazide 25 mg daily.  9. Bupropion 100 mg b.i.d.   SOCIAL HISTORY:  1. The patient is married.  2. She has 2 living children.  3. She has had 2 children die.  4. She has never smoked cigarettes and does not drink alcohol.   FAMILY HISTORY:  Is contributory for father dying suddenly at age 22  with myocardial infarction.   REVIEW OF SYSTEMS:  Positive for cataracts, reflux, swelling in her  ankles when she stands, joint pains, easy bruising.  Negative for other  systems.   PHYSICAL EXAMINATION:  The patient is in no distress.  Blood pressure  115/70, heart rate 70 and regular, weight  203 pounds, body mass index  30.  HEENT:  Eyes unremarkable, pupils equal, round, react to light, fundi  not well visualized, oral mucosa unremarkable.  NECK:  No jugular venous distention at 45 degrees, carotid upstroke  brisk and symmetric, no bruits, no thyromegaly.  LYMPHATICS:  No cervical, axillary or inguinal adenopathy.  LUNGS:  Clear to auscultation bilaterally.  BACK:  No costovertebral angle tenderness.  CHEST:  Unremarkable.  HEART:  PMI not displaced or sustained, S1 and S2 within normal limits,  no S3, no S4, no clicks, no rubs, no murmurs.  ABDOMEN:  Obese, positive bowel sounds normal in frequency and pitch, no  bruits, no rebound, no guarding, no midline pulses, no masses, no  hepatomegaly, no splenomegaly.  SKIN:  No rashes, no nodules.  EXTREMITIES:  Pulses 2+ throughout, no edema, no cyanosis, no clubbing.  NEURO:  Oriented to person, place and time, cranial nerves II-XII  grossly intact, motor grossly intact.   EKG:  Sinus rhythm, rate 70, axis within normal limits, intervals within  normal limits, no acute ST-T wave changes.    ASSESSMENT AND PLAN:  1. Chest and back discomfort.  The patient's chest and back discomfort      is concerning.  She has had longstanding hypertension.  She      describes a sharp discomfort between her shoulder blades.  I think      there is a small possibility this could be related to aneurysm or      chronic dissection.  Therefore, I think it is prudent to get a      thoracic CT to rule this out.  If this is normal I do plan      Adenosine perfusion study as she has a moderate pretest probability      for obstructive coronary disease.  If both of these are negative      then no further cardiovascular workup would be planned and she      could follow with Dr. Cleophas Dunker for probable musculoskeletal      discomfort.  2. Risk reduction.  She understands need to lose weight with diet and      exercise and she is working on this.  3. Hypertension.  Blood pressure is well controlled on the medications      as listed and she will continue this.  4. Dyslipidemia per Dr. Cleophas Dunker, with a target LDL in the 70's, HDL in      the 50's.  5. Followup.  I will see her back based on the results of the above.     Rollene Rotunda, MD, [facility]     JH/MedQ  DD: 08/17/2006  DT: 08/17/2006  Job #: [id number]   cc:   Melina Fiddler, MD
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
## 2010-08-27 NOTE — Discharge Summary (Signed)
[name], ASHMORE             ACCOUNT NO.:  [id number]   MEDICAL RECORD NO.:  [id number]          PATIENT TYPE:  INP   LOCATION:  5041                         FACILITY:  [facility]   PHYSICIAN:  Claude Manges. Whitfield, M.D.DATE OF BIRTH:  1945-10-16   DATE OF ADMISSION:  08/08/2008  DATE OF DISCHARGE:  08/12/2008                               DISCHARGE SUMMARY   ADMISSION DIAGNOSIS:  Osteoarthritis of the right knee.   DISCHARGE DIAGNOSES:  1. Osteoarthritis of the right knee.  2. Osteoarthritis, left knee.  3. History of diabetes mellitus.  4. History of hypertension.  5. Hyperosmolality.  6. Post-hemorrhagic anemia.  7. Hypokalemia.  8. Gastroesophageal reflux disease.  9. Obesity.   PROCEDURE:  Right total knee arthroplasty.   HISTORY:  This is a 65 year old white female with longstanding pain and  end-stage osteoarthritis of the right knee.  She is now in constant  severe throbbing and aching and burning pain.  She currently uses  ibuprofen for pain.  She has failed conservative treatment including  viscosupplementation and nonsteroidal anti-inflammatories.  She has  radiographic end-stage osteoarthritis.  Indicated for right total knee  arthroplasty.   HOSPITAL COURSE:  A 65 year old female admitted on August 08, 2008.  After appropriate laboratory studies were obtained as well as 2 grams of  Ancef IV on-call to the operating room, was taken to the operating room  where she underwent a right total knee arthroplasty.  She tolerated the  procedure well.  She was continued on Dilaudid PCA pump postoperatively.  Ancef 1 gram IV q.6 h. for 3 doses was also ordered.  Started on Lovenox  30 mg subcu q.12 h. at 10:00 p.m. on August 08, 2008.  Coumadin per  pharmacy protocol.  Thigh-high TED hoses were placed.  CPM 0-60 degrees  for 8 hours per day for 5-10 degrees per day.  Consults with PT and OT  were made.  Partial weightbearing 50%.  Glycemic control order set was  used for  moderate correction.  Hemoglobin A1c was ordered.  No basal  insulin and no meal coverage was ordered.  Dilaudid PCA pump was used  for pain management.  She was allowed out of bed to chair the following  day.  She was weaned off her PCA and her O2.  Potassium was ordered at  30 mEq p.o. b.i.d. on August 09, 2008.  Hydrochlorothiazide was held.  Her Foley was discontinued after her first physical therapy session.  Percocet was increased to 10/325 one to two tabs every 4-6 hours p.r.n.  pain.  She was allowed out of bed to physical therapy on August 10, 2008  x2.  Her potassium remained low and on the August 11, 2008, she received  30 mEq p.o. b.i.d.  Lovenox was discontinued on that day.  IVs were  discontinued on Aug 12, 2008.  A 40 mEq p.o. of K-Dur was ordered on Aug 12, 2008.  Remainder of her hospital course was uneventful and she was  discharged on Aug 12, 2008 to return back to the office in followup.   LABORATORY STUDIES:  Admitted with hemoglobin  13.4, hematocrit 38.7%,  white count 7500, and platelets 340,000.  Discharge hemoglobin 10.4,  hematocrit 29.7%, white count 9600, and platelets 258,000.  Preop  Protime 12.3, INR 0.9, and PTT 28.  Discharge Protime 25.4 and INR 2.2.  Preop sodium 139, potassium 3.5, chloride 103, CO2 28, glucose 109, BUN  11, and creatinine 1.17.  Discharge sodium was 138, potassium 3.3,  chloride 98, CO2 31, glucose 112, BUN 10, and creatinine 0.76.  She  dropped to sodium 134 on August 09, 2008 as well as potassium 3.0 on  August 09, 2008.  GFR preop was 47.  Discharge GFR was greater than 60.  Preop total protein was 6.5, albumin 3.9, AST 21, ALT 18, ALP 76, and  total bilirubin 0.7.  Glycosylated hemoglobin A1c was 6.1.  A urinalysis  on August 03, 2008 revealed 3-6 whites, few bacteria, and hyaline casts.  On August 08, 2008 revealed small amount of leukocyte esterase with many  epithelials, 0-2 whites, 0-2 reds, rare bacteria, and hyaline casts were   noted.  Urine culture on August 03, 2008 revealed greater than greater  than 300,000 colonies of multiple bacterial morphotypes.  Blood type was  O+ and antibody screen negative.   DISCHARGE INSTRUCTIONS:  She was discharged on diabetic diet.  Crutches  or walker ambulating 50% weightbearing on the right.  She may shower on  Saturday.  No lifting or driving for 6 weeks.  Walk with a walker with  50% body weight on the right.  CPM 0-70 degrees for 8 hours per day  increasing by 5-10 degrees per day.  Keep her incision clean and dry.  Change dressing daily with 4 x 4s and tape.  A prescription for Percocet  5/325 one to two tablets every 4 hours as needed for pain, Robaxin 500  mg 1 tablet every 6-8 hours as needed for spasms, Coumadin 5 mg as  directed by [facility] pharmacist taking one-half tablet of 2.5 mg  Saturday and Sunday.  Colace 100 mg b.i.d. and MiraLax p.r.n.  constipation.  She will follow back up with Dr. Cleophas Dunker on Aug 21, 2008.  She was discharged in improved condition.      Oris Drone Petrarca, P.A.-C.      Claude Manges. Cleophas Dunker, M.D.  Electronically Signed    BDP/MEDQ  D:  09/14/2008  T:  09/15/2008  Job:  [id number]

## 2010-08-30 NOTE — Op Note (Signed)
   NAMEKRISTILYN, COLTRANE                         ACCOUNT NO.:  [id number]   MEDICAL RECORD NO.:  [id number]                   PATIENT TYPE:  AMB   LOCATION:  DSC                                  FACILITY:  [facility]   PHYSICIAN:  Rose Phi. Maple Hudson, M.D.                DATE OF BIRTH:  [birth year]   DATE OF PROCEDURE:  02/27/2003  DATE OF DISCHARGE:                                 OPERATIVE REPORT   PREOPERATIVE DIAGNOSIS:  Basal cell carcinoma of the left foot.   POSTOPERATIVE DIAGNOSIS:  Basal cell carcinoma of the left foot.   PROCEDURE:  Excision of basal cell carcinoma of the left foot.   SURGEON:  Rose Phi. Maple Hudson, M.D.   ANESTHESIA:  Local.   PROCEDURE:  The patient was placed on the table and the left foot prepped  and draped in the usual fashion. An elliptical incision was outlined around  the basal cell tumor and the area infiltrated with 1% Xylocaine and  adrenalin.  An incision was made and the lesion was excised.  This left a  defect of about 4 x 2 cm.  It was closed in a single layer of interrupted 4-  0 nylon.  A dressing was applied.  The patient was then allowed to go home.                                               Rose Phi. Maple Hudson, M.D.    PRY/MEDQ  D:  02/27/2003  T:  02/27/2003  Job:  [id number]

## 2010-08-30 NOTE — Letter (Signed)
October 21, 2006    Melina Fiddler, MD  [address] [place], [place] [address]   RE:  [name], [name]  MRN:  [id number]  /  DOB:  [birth year]   Dear Kriste Basque:   It was a pleasure to see Ms. Lacount recently for evaluation of chest  discomfort.  I was quite concerned about her pain and initially sent her  for a chest CT to rule out any problems such as thoracic aneurysm or  dissection.  This was negative for any acute findings.  There was  reported a ground glass appearance to her lungs, though they did not  make much of this.  They said it was nonspecific for an alveolitis or  atelectasis.  She did have some coronary and aortic calcification.  However, subsequent Cardiolite was negative for any evidence of  ischemia.  At this point, I would not suggest further cardiovascular  testing, unless another etiology cannot be identified.  At that point, I  would be happy to see her back.   I am not sure what followup she needs to have for the abnormal finding  on her CT in her lung fields.  I wonder if I could defer this to you and  your attention.   Again, thanks for letting me participate in her care.    Sincerely,      Rollene Rotunda, MD, [facility]  Electronically Signed    JH/MedQ  DD: 10/21/2006  DT: 10/22/2006  Job #: [id number]

## 2010-09-05 ENCOUNTER — Telehealth: Payer: Self-pay | Admitting: Family Medicine

## 2010-09-05 ENCOUNTER — Encounter: Payer: Self-pay | Admitting: Family Medicine

## 2010-09-05 ENCOUNTER — Ambulatory Visit (INDEPENDENT_AMBULATORY_CARE_PROVIDER_SITE_OTHER): Payer: Medicare HMO | Admitting: Family Medicine

## 2010-09-05 ENCOUNTER — Other Ambulatory Visit: Payer: Self-pay

## 2010-09-05 DIAGNOSIS — I10 Essential (primary) hypertension: Secondary | ICD-10-CM

## 2010-09-05 DIAGNOSIS — D489 Neoplasm of uncertain behavior, unspecified: Secondary | ICD-10-CM | POA: Insufficient documentation

## 2010-09-05 DIAGNOSIS — G20 Parkinson's disease: Secondary | ICD-10-CM

## 2010-09-05 DIAGNOSIS — G20C Parkinsonism, unspecified: Secondary | ICD-10-CM

## 2010-09-05 NOTE — Patient Instructions (Signed)
Nurse visit for BP next week Appt with me in 4-6 weeks STOP amlodipine

## 2010-09-05 NOTE — Telephone Encounter (Signed)
Informed pt to d/c flexeril, trazodone and hold amlodipine. Scheduled her for next Thursday 5/31 for 830 am and will cancel her nurse visit. Pt agreed.Laureen Ochs, Viann Shove '

## 2010-09-05 NOTE — Telephone Encounter (Signed)
Please call her and tell her I reviewed her med list after she left---with the new medicine ( Azilect) --she should NOT be taking flexeril or trazodone.  I already held her amlodipine and I want her to not take that as we discussed. See if you can actually put her IN CLINIC next THURS instead of just a nurse visit BP check so that I can review thhis new med in ligt of her low BP today. So STOP FLEXERIL, STOP TRAZODONE and HOLD AMLODIPINE. THANKS! ,me

## 2010-09-05 NOTE — Progress Notes (Signed)
  Subjective:    Patient ID: [name], female    DOB: 1945-05-22, 65 y.o.   MRN: [id number]  HPI  Here for biopsy of lesion on arm----feeling dizzy. BP 80s systolic---she says she has been increasingly dizzy since she started new med for her parkinsons last week. (Azilect). She reports she does not have f/u with neurologist for 6 m. She assumed I would be following her med change,  No chest pains. Did not drive today--her family is in car with her son waiting for her. Dizziness is lightheadedness, worse with standing up from seated position, does not feel faint. No falls. No SOB  And no visual changes. Does not feel confused and everything seems to be "working ok".  Review of Systems  Constitutional: Negative for fever, chills and fatigue.  HENT: Negative for trouble swallowing and tinnitus.   Eyes: Negative for visual disturbance.  Respiratory: Positive for chest tightness. Negative for shortness of breath.   Cardiovascular: Negative for chest pain.  Gastrointestinal: Negative for abdominal pain.  Skin: Negative for rash.  Neurological: Positive for dizziness, tremors and light-headedness. Negative for syncope, weakness, numbness and headaches.  Psychiatric/Behavioral: Negative for hallucinations, confusion and agitation.       Objective:   Physical Exam    GENERALl: Well developed, well nourished, in no acute distress. HEENT: EOMI, no nystagmus. PERRLA NECK: Supple, FROM, without lymphadenopathy.  LUNGS: clear to auscultation bilaterally. No wheezes or rales. HEART: Regular rate and rhythm, no murmurs NEURO: No gross focal deficits.; she does have tremor and moth trembling (parkinsons)  Gait is pretty normal for her. MSK rises easily from chair without assistance  SKIN right forearm has 1 cm lesion, heaped borders, no erythema, central area raised and hypertrophic skin.   PROCEDURE NOTE: patient give ninformed consent, signed copy in teh chart. Appropriate time out  taken. I discussed with her whether she really wanted to go ahead and do the  Procedure in light of her dizziness, but she said she did want to go ahead. Area prepped and draped in usual sterile fashion. ! Cc 2% lidocaine w epi used for local anesthesia. 4 mm punch biopsy used to remove the majority (but not all ) of the lesion. Minimal bleeding, used some dry sol topically for hemostasis. Antibiotic ointment, telfa pad and pressure bandage applied. Pathology pending. I used the pressure bandage because she bruises so easily (senile purpura) and told her Ok to remove in an hour. Post procedure red flags discussed.      Assessment & Plan:   1) skin lesion: looks like a basal cell. Path pending 2) hyp[otension and 3) parkinsons--new med can interact with a lot of her current meds. See  Remainder of Assessment and Plan information listed in Problem Oriented Assessment and Plan notes.

## 2010-09-05 NOTE — Assessment & Plan Note (Signed)
Neurologist started her on azilect but does not have her scheduled for f/u for 6 m per her. She was hypotensive today in clinic and dizzy.  Her BP was 80 systolic today. The neurologisrt started her on azilect last week. I reviewed her med list some while she was here and more thoroughly after she left--we are HOLDING her amlodipine--I hate to try abruptly stopping her clonidine but ultimately we will try to stop that and get her back on the amlodipine. Will aslo stop flexeril (don't think she is taking that very ofter) and stop her trazodone--she will be unhappy about that as it has worked for sleep but contraindicated in light of her azilect.

## 2010-09-05 NOTE — Assessment & Plan Note (Signed)
Her BP was 80 systolic today. The neurologisrt started her on azilect last week. I reviewed her med list some while she was here and more thoroughly after she left--we are HOLDING her amlodipine--I hate to try abruptly stopping her clonidine but ultimately we will try to stop that and get her back on the amlodipine. Will aslo stop flexeril (don't think she is taking that very ofter) and stop her trazodone--she will be unhappy about that as it has worked for sleep but contraindicated in light of her azilect.

## 2010-09-06 ENCOUNTER — Telehealth: Payer: Self-pay | Admitting: Family Medicine

## 2010-09-06 NOTE — Telephone Encounter (Signed)
Pt was here yesterday, is having some problems with anxiety today & wants to know what she can do at home?

## 2010-09-06 NOTE — Telephone Encounter (Signed)
Dr. Jennette Kettle advises to stop Azilect. May start back on trazadone if she wants to . Already has appointment scheduled 09/12/2010 with Dr. Jennette Kettle and will follow up then. Patient notified.

## 2010-09-06 NOTE — Telephone Encounter (Signed)
Patient states she stopped meds as directed yesterday by Dr. Jennette Kettle. Did not take trazadone last night.  Also she started Azilect a week ago.   today she feels shaky , trembling , very anxious. Will send message to Dr. Jennette Kettle .

## 2010-09-12 ENCOUNTER — Ambulatory Visit: Payer: Medicare HMO

## 2010-09-18 ENCOUNTER — Telehealth: Payer: Self-pay | Admitting: *Deleted

## 2010-09-18 ENCOUNTER — Ambulatory Visit: Payer: Medicare HMO | Admitting: Gastroenterology

## 2010-09-18 NOTE — Telephone Encounter (Signed)
Patient calls at 11:42 stating her cousin has been staying with her and had a bottle of diazepam 10 mg . Patient mistakedly took this at 8:00 AM today and again now around 11:40 AM.  She thought she was reaching for her parkinson medication. She has just realized her mistake. She has not taken hydrocodone today .  States she feels nauseated and has a little headache. Consulted with Dr. [place] . First advised to remove the bottle of diazipam so this doesn't happen again.  Have someone with her to watch her today and if she is hard to arouse , has slowed depressed respirations   notify MD or  Call 911. Do not take hydrocodone today. Her grand daughter is staying with her.

## 2010-09-19 ENCOUNTER — Ambulatory Visit (INDEPENDENT_AMBULATORY_CARE_PROVIDER_SITE_OTHER): Payer: Medicare HMO | Admitting: Family Medicine

## 2010-09-19 ENCOUNTER — Telehealth: Payer: Self-pay | Admitting: Family Medicine

## 2010-09-19 DIAGNOSIS — G20 Parkinson's disease: Secondary | ICD-10-CM

## 2010-09-19 DIAGNOSIS — F1910 Other psychoactive substance abuse, uncomplicated: Secondary | ICD-10-CM

## 2010-09-19 DIAGNOSIS — IMO0002 Reserved for concepts with insufficient information to code with codable children: Secondary | ICD-10-CM

## 2010-09-19 NOTE — Telephone Encounter (Signed)
Patients daughter would like to talk to you about being over-medicated.  She acts differently at home than when she comes in to office. Please call her.

## 2010-09-20 DIAGNOSIS — F1910 Other psychoactive substance abuse, uncomplicated: Secondary | ICD-10-CM | POA: Insufficient documentation
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
## 2010-09-20 NOTE — Telephone Encounter (Signed)
I spoke with her at her mother's ov

## 2010-09-23 ENCOUNTER — Other Ambulatory Visit: Payer: Self-pay | Admitting: Family Medicine

## 2010-09-23 MED ORDER — LANCETS MISC: Status: DC

## 2010-09-25 ENCOUNTER — Encounter: Payer: Self-pay | Admitting: Family Medicine

## 2010-09-25 ENCOUNTER — Other Ambulatory Visit: Payer: Medicare HMO

## 2010-09-25 ENCOUNTER — Other Ambulatory Visit: Payer: Self-pay | Admitting: Family Medicine

## 2010-09-25 ENCOUNTER — Ambulatory Visit (INDEPENDENT_AMBULATORY_CARE_PROVIDER_SITE_OTHER): Payer: Medicare HMO | Admitting: Family Medicine

## 2010-09-25 DIAGNOSIS — G20 Parkinson's disease: Secondary | ICD-10-CM

## 2010-09-25 DIAGNOSIS — R5381 Other malaise: Secondary | ICD-10-CM

## 2010-09-25 DIAGNOSIS — E785 Hyperlipidemia, unspecified: Secondary | ICD-10-CM

## 2010-09-25 DIAGNOSIS — F339 Major depressive disorder, recurrent, unspecified: Secondary | ICD-10-CM

## 2010-09-25 DIAGNOSIS — G20A1 Parkinson's disease without dyskinesia, without mention of fluctuations: Secondary | ICD-10-CM

## 2010-09-25 DIAGNOSIS — I10 Essential (primary) hypertension: Secondary | ICD-10-CM

## 2010-09-25 DIAGNOSIS — F1910 Other psychoactive substance abuse, uncomplicated: Secondary | ICD-10-CM

## 2010-09-25 DIAGNOSIS — R5383 Other fatigue: Secondary | ICD-10-CM

## 2010-09-25 DIAGNOSIS — IMO0002 Reserved for concepts with insufficient information to code with codable children: Secondary | ICD-10-CM

## 2010-09-25 LAB — TSH: TSH: 0.769 u[IU]/mL (ref 0.350–4.500)

## 2010-09-25 NOTE — Progress Notes (Signed)
cmp and tsh done today [name]

## 2010-09-26 ENCOUNTER — Telehealth: Payer: Self-pay | Admitting: Family Medicine

## 2010-09-26 ENCOUNTER — Telehealth: Payer: Self-pay | Admitting: *Deleted

## 2010-09-26 ENCOUNTER — Other Ambulatory Visit: Payer: Self-pay | Admitting: Family Medicine

## 2010-09-26 ENCOUNTER — Encounter: Payer: Self-pay | Admitting: Family Medicine

## 2010-09-26 ENCOUNTER — Inpatient Hospital Stay (HOSPITAL_COMMUNITY)
Admission: AD | Admit: 2010-09-26 | Discharge: 2010-09-27 | DRG: 057 | Disposition: A | Discharge status: Home / Self Care | Payer: Medicare HMO | Source: Ambulatory Visit | Attending: Family Medicine | Admitting: Family Medicine

## 2010-09-26 DIAGNOSIS — E785 Hyperlipidemia, unspecified: Secondary | ICD-10-CM | POA: Diagnosis present

## 2010-09-26 DIAGNOSIS — R5381 Other malaise: Secondary | ICD-10-CM

## 2010-09-26 DIAGNOSIS — I10 Essential (primary) hypertension: Secondary | ICD-10-CM | POA: Diagnosis present

## 2010-09-26 DIAGNOSIS — Z96659 Presence of unspecified artificial knee joint: Secondary | ICD-10-CM

## 2010-09-26 DIAGNOSIS — R269 Unspecified abnormalities of gait and mobility: Secondary | ICD-10-CM | POA: Diagnosis present

## 2010-09-26 DIAGNOSIS — Z7982 Long term (current) use of aspirin: Secondary | ICD-10-CM

## 2010-09-26 DIAGNOSIS — E119 Type 2 diabetes mellitus without complications: Secondary | ICD-10-CM | POA: Diagnosis present

## 2010-09-26 DIAGNOSIS — F341 Dysthymic disorder: Secondary | ICD-10-CM | POA: Diagnosis present

## 2010-09-26 DIAGNOSIS — G20 Parkinson's disease: Secondary | ICD-10-CM

## 2010-09-26 DIAGNOSIS — G8929 Other chronic pain: Secondary | ICD-10-CM | POA: Diagnosis present

## 2010-09-26 DIAGNOSIS — K219 Gastro-esophageal reflux disease without esophagitis: Secondary | ICD-10-CM | POA: Diagnosis present

## 2010-09-26 DIAGNOSIS — Z79899 Other long term (current) drug therapy: Secondary | ICD-10-CM

## 2010-09-26 DIAGNOSIS — M1990 Unspecified osteoarthritis, unspecified site: Secondary | ICD-10-CM | POA: Diagnosis present

## 2010-09-26 DIAGNOSIS — R5383 Other fatigue: Secondary | ICD-10-CM

## 2010-09-26 DIAGNOSIS — G20A1 Parkinson's disease without dyskinesia, without mention of fluctuations: Principal | ICD-10-CM | POA: Diagnosis present

## 2010-09-26 DIAGNOSIS — M549 Dorsalgia, unspecified: Secondary | ICD-10-CM | POA: Diagnosis present

## 2010-09-26 LAB — COMPREHENSIVE METABOLIC PANEL
ALT: 8 U/L (ref 0–35)
ALT: 6 U/L (ref 0–35)
AST: 13 U/L (ref 0–37)
AST: 14 U/L (ref 0–37)
Albumin: 4.2 g/dL (ref 3.5–5.2)
Albumin: 3.5 g/dL (ref 3.5–5.2)
Alkaline Phosphatase: 64 U/L (ref 39–117)
Alkaline Phosphatase: 65 U/L (ref 39–117)
BUN: 10 mg/dL (ref 6–23)
BUN: 9 mg/dL (ref 6–23)
CO2: 24 mEq/L (ref 19–32)
CO2: 31 mEq/L (ref 19–32)
Calcium: 9.3 mg/dL (ref 8.4–10.5)
Calcium: 9.4 mg/dL (ref 8.4–10.5)
Chloride: 104 mEq/L (ref 96–112)
Chloride: 101 mEq/L (ref 96–112)
Creat: 0.86 mg/dL (ref 0.50–1.10)
Creatinine, Ser: 0.72 mg/dL (ref 0.4–1.2)
GFR calc Af Amer: >60 mL/min (ref >60)
GFR calc non Af Amer: >60 mL/min (ref >60)
Glucose, Bld: 88 mg/dL (ref 70–99)
Glucose, Bld: 108 mg/dL — ABNORMAL HIGH (ref 70–99)
Potassium: 3.6 mEq/L (ref 3.5–5.3)
Potassium: 3.1 mEq/L — ABNORMAL LOW (ref 3.5–5.1)
Sodium: 142 mEq/L (ref 135–145)
Sodium: 142 mEq/L (ref 135–145)
Total Bilirubin: 0.5 mg/dL (ref 0.3–1.2)
Total Bilirubin: 0.4 mg/dL (ref 0.3–1.2)
Total Protein: 6.5 g/dL (ref 6.0–8.3)
Total Protein: 6.5 g/dL (ref 6.0–8.3)

## 2010-09-26 LAB — CBC
HCT: 37.1 % (ref 36.0–46.0)
Hemoglobin: 13.1 g/dL (ref 12.0–15.0)
MCH: 32.5 pg (ref 26.0–34.0)
MCHC: 35.3 g/dL (ref 30.0–36.0)
MCV: 92.1 fL (ref 78.0–100.0)
Platelets: 287 10*3/uL (ref 150–400)
RBC: 4.03 MIL/uL (ref 3.87–5.11)
RDW: 11.9 % (ref 11.5–15.5)
WBC: 6.9 10*3/uL (ref 4.0–10.5)

## 2010-09-26 LAB — HEMOGLOBIN A1C
Hgb A1c MFr Bld: 5.7 % — ABNORMAL HIGH (ref <5.7)
Mean Plasma Glucose: 117 mg/dL — ABNORMAL HIGH (ref <117)

## 2010-09-26 LAB — GLUCOSE, CAPILLARY
Glucose-Capillary: 101 mg/dL — ABNORMAL HIGH (ref 70–99)
Glucose-Capillary: 98 mg/dL (ref 70–99)
Glucose-Capillary: 105 mg/dL — ABNORMAL HIGH (ref 70–99)
Glucose-Capillary: 127 mg/dL — ABNORMAL HIGH (ref 70–99)

## 2010-09-26 LAB — TSH: TSH: 0.425 u[IU]/mL (ref 0.350–4.500)

## 2010-09-26 NOTE — Progress Notes (Signed)
Subjective:    Patient ID: [name], female    DOB: June 30, 1945, 65 y.o.   MRN: [id number]  HPI Hospital Admission Note Date: 09/26/2010  [name] Medical record number: [id number] Date of birth: Nov 06, 1945 Age: 65 y.o. Gender: female PCP: Denny Levy, MD, MD  Medical Service: Family medicine teaching Service  Attending physician: Mcdiarmid     Resident (R1): Floor calls    Pager: [phone]  Chief Complaint: weakness  History of Present Illness:65 yo female with newly dx parkinsons disease (past few months) directly admitted from outpatient setting by PCP for several day history of increasing generalized weakness.  Patient was seen by PCP yesterday in clinic, noted "having more problems getting "stuck" in a chair. Difficulty getting motion initiated"  Last night was not able to get out of chair at restaurant, and also had difficulty getting out of bed this morning.    Mid day, was started on sinemet, and azilect was started by neurology.  June 7th, azilect was d/c by PCP for side effects  Here in hospital with her daughter, grand-daughter and her husband.  They note she has worse slowing both cognitively and physically as the day goes on.  ROS: No confusion, or changes in mental status other than generalized slowing.  ROS is positive for constipation, diarrhea, fatigue which all have been present for several months since she has been diagnosed with PD.  Has had increasing difficulty swallowing large pills, notes history of esophageal dilation.  Notes numbness and tingling of hands 7-8 years in duration.   Current Outpatient Prescriptions  Medication Sig Dispense Refill  . DISCONTD: amLODipine (NORVASC) 10 MG tablet       . DISCONTD: hydrochlorothiazide 25 MG tablet Take 25 mg by mouth daily.          Allergies: Azilect and Lisinopril  Past Medical History  Diagnosis Date  . Allergy   . Anxiety   . Arthritis   . Depression   . Diabetes mellitus     . Hyperlipidemia   . HIV infection     Past Surgical History  Procedure Date  . Total knee arthroplasty     right  . Esophagogastric fundoplasty     some type "esoph surgery" per pt    Family History  Problem Relation Age of Onset  . Cerebral palsy Son     History   Social History  . Marital Status: Married    Spouse Name: N/A    Number of Children: N/A  . Years of Education: N/A   Occupational History  . Not on file.   Social History Main Topics  . Smoking status: Never Smoker   . Smokeless tobacco: Not on file  . Alcohol Use: No  . Drug Use: No  . Sexually Active: Not on file   Other Topics Concern  . Not on file   Social History Narrative  . No narrative on file    Review of Systems: Pertinent items are noted in HPI.   Physical Exam: T 98.1 Pulse 75, R 20 BP 130/77 Weight 165 lbs There were no vitals filed for this visit. General appearance: alert, cooperative, appears stated age and no distress Head: Normocephalic, without obvious abnormality, atraumatic, alert and oriented Eyes: conjunctivae/corneas clear. PERRL, EOM's intact. Fundi benign., EOMI, no nystagmus Lungs: clear to auscultation bilaterally Heart: regular rate and rhythm, S1, S2 normal, no murmur, click, rub or gallop Abdomen: soft, non-tender; bowel sounds normal; no masses,  no organomegaly  Pulses: 2+ and symmetric Neurologic: Alert and oriented X 3, normal strength and tone. Normal symmetric reflexes. Normal coordination and gait Mental status: Alert, oriented, thought content appropriate Cranial nerves: normal Motor:grossly normal 5/5 upper and lower extremities  Reflexes: 2+ and symmetric Coordination: Romberg test normal Gait: Normal Finger to nose normal.  No pronator drift.  Able to stand up from chair quickly and walk around room with minimal difficulty, no shuffling gait.  Some cogwheel rigitity bilaterally,  Assessment & Plan by Problem:  1. Parkinson Disease;  Increased  psychomotor slowing likely due to PD.  Continue on Sinemet, will consult neurology for suggestions on treatment plan, will likely be able to follow-up as an outpatient for further titration.  No significant weakness at this moment, not concerned for other systemic cause/  2.  Gait instability:  This morning doing well, may have some waxing and waning throughout day.  Will get PT consult.  3.  Hypertension:  Has been changing BP regimen as an outpatient.  Will continue on clonidine and patient did take amlodipine today despite it being discontinued.  Will continue amlodipine and clonidine as PCP plans on titrating off clonidine in the future.  Is currenlty off HCTZ recently.  4.  DM:  Takes metformin only.  Will hold while in hospital, SSI.  5.  HLD:  Continue simvastatin  6.  Chronic back pain:  vicodin per home regimen  7.  Anxiety/Depression:  Has been weaned off benzos, history of misuse.  Has also been weaned of trazodone as an outpatient due to interaction with Azilect.    8.  FEN/GI: was taking KCL daily but has been non compliant due to trouble swallowing and not liking the packets.  Will hold until BMET obtained.  Now off HCTZ so may no longer need it. Continue home omeprazole  9.  Prophylaxis:  Heparin 5000 tid  10.  Dispo:  Observation status, d./c pending on neurology plans.     Review of Systems     Objective:   Physical Exam        Assessment & Plan:

## 2010-09-26 NOTE — Telephone Encounter (Signed)
[name] admitted this am to hospital per spouse.  Mr. [name] would like for you to come and review the meds she have in her possession now.  Have all meds that she is taking.  Want to make sure she is suppose to take all of them.  Please call him on his cell phone at earliest convenience

## 2010-09-26 NOTE — Telephone Encounter (Signed)
Patient calls reporting that after she left appointment here yesterday she went to  [employer] to eat. She was unable to to get out of chair to leave. She had to be taken out. She went home and got in bed. States ' I peed all night , I know I lost 3-4 lbs of water. " legs very painful .  This AM she is able to walk however .  States she was taken off amlopidine 5 days ago.  Consulted Dr. Jennette Kettle and she is going to direct admit her to hospital. Called bed placement and was told to have patient go to admitting now and they will give her a bed on 5500.  Patient notified.

## 2010-09-26 NOTE — Progress Notes (Signed)
  Subjective:    Patient ID: [name], female    DOB: 12/30/1945, 65 y.o.   MRN: [id number]  HPI   F/u substance mis -use, f/u parkinsons, f/u depression 1) substance misuse--no more issues. Taking only her rx  meds 2) parkinsons to be having more problems getting "stuck" in a chair. Difficulty getting motion initiated. Has been worsening a little over last 2-3 days. Wants to see a new neurologist--did not like the last one as she felt her did not care about her--unhappy he started her on new med (azilect) and then did not schedule f/u for 6 m. 3) depression and family stressors--has had family conference and is making arrangements for her dependent son [name]). Still feels a lot of pressure from the family as she has always been the one "in charge" and she feels she cannot do it anymore. Denies suicidal ideation,. Intent or plan. Review of Systems Pertinent review of systems: negative for fever or unusual weight change. See hpi for additional ros    Objective:   Physical Exam     GENERALl: Well developed, well nourished, in no acute distress. NECK: Supple, FROM, without lymphadenopathy.  THYROID: normal without nodularity CAROTID ARTERIES: without bruits LUNGS: clear to auscultation bilaterally. No wheezes or rales. HEART: Regular rate and rhythm, no murmurs ABDOMEN: soft with positive bowel sounds NEURO: Orofacial dyskinesais and hand tremor seem somewhat worse today. She is having some slight speech difficulty getting ger words out. Also a little difficulty rising from a chair but once up she  Maintains good stride. Mild cog wheel rigidity UE. PSYCH: A&O x4. Interactive. Asks and answers questions appropriately. Normal thought process and content.     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 Continue sinemett at current dose and wil get her new neurologist appt as she did not feel confident in  last one.

## 2010-09-27 LAB — BASIC METABOLIC PANEL
BUN: 8 mg/dL (ref 6–23)
CO2: 28 mEq/L (ref 19–32)
Calcium: 8.9 mg/dL (ref 8.4–10.5)
Chloride: 107 mEq/L (ref 96–112)
Creatinine, Ser: 0.7 mg/dL (ref 0.50–1.10)
GFR calc Af Amer: >60 mL/min (ref >60)
GFR calc non Af Amer: >60 mL/min (ref >60)
Glucose, Bld: 106 mg/dL — ABNORMAL HIGH (ref 70–99)
Potassium: 4.1 mEq/L (ref 3.5–5.1)
Sodium: 141 mEq/L (ref 135–145)

## 2010-09-27 LAB — GLUCOSE, CAPILLARY: Glucose-Capillary: 104 mg/dL — ABNORMAL HIGH (ref 70–99)

## 2010-09-27 MED ORDER — MIRTAZAPINE 15 MG PO TBDP: 15.0000 mg | ORAL_TABLET | Freq: Every day | ORAL | Status: AC

## 2010-09-27 MED ORDER — AMLODIPINE BESYLATE 10 MG PO TABS: 5.0000 mg | ORAL_TABLET | Freq: Every day | ORAL | Status: DC

## 2010-09-27 NOTE — Telephone Encounter (Signed)
Discussed her meds with her prior to d/c today.

## 2010-09-27 NOTE — Telephone Encounter (Signed)
Please let Mr. Sorter know that some of her medications are being changed during this hospitalization.  The hospital team doctor will review all the medications she is supposed to take on discharge.  Please have him let the hospital nurse know he would like to speak with the doctor if he has not had his questions answered.

## 2010-09-29 ENCOUNTER — Other Ambulatory Visit: Payer: Self-pay | Admitting: Family Medicine

## 2010-10-02 ENCOUNTER — Encounter: Payer: Self-pay | Admitting: Home Health Services

## 2010-10-02 ENCOUNTER — Ambulatory Visit (INDEPENDENT_AMBULATORY_CARE_PROVIDER_SITE_OTHER): Payer: Medicare HMO | Admitting: Home Health Services

## 2010-10-02 ENCOUNTER — Ambulatory Visit: Payer: Medicare HMO | Admitting: Family Medicine

## 2010-10-02 VITALS — BP 136/87 | Temp 98.2°F | Ht 63.5 in | Wt 167.9 lb

## 2010-10-02 DIAGNOSIS — Z0000 Encounter for general adult medical examination without abnormal findings: Secondary | ICD-10-CM

## 2010-10-02 NOTE — Patient Instructions (Signed)
1. Follow up and schedule a mammogram. 2. Focus on eating 3-4 vegetables a day. 3. Try walking at least 1 time a week and include more movement into your daily routines.  4. Think about writing poetry and journaling  again.  5. Consider getting pneumonia vaccine next you are in the office. 6. Review and complete your Living Will and bring a copy to Dr. Jennette Kettle when finished.

## 2010-10-02 NOTE — Progress Notes (Signed)
Patient here for annual wellness visit, patient reports: Risk Factors/Conditions needing evaluation or treatment: Patient has reported that she has not had much of an appetite for past few weeks and is not keeping her food down.  Pt reports throwing up every other day or so. Home Safety: Patient lives with husband, son, daughter and granddaughter.  Patient reports having smoke detectors and adaptive equipment in the bathroom.  Other Information: Corrective lens: Patient wears corrective lens for reading and visits the eye doctor annually.  Dentures: Patient does not have dentures and visits dentist annually.  Memory: Patient reports some memory loss-names.  Patient's Mini Mental Score (recorded in doc. flowsheet): 28  Balance/Gait: Pt does not demonstrate any noticeable impairments.  Balance Abnormal Patient value  Sitting balance    Sit to stand    Attempts to arise    Immediate standing balance    Standing balance    Nudge    Eyes closed- Romberg    Tandem stance    Back lean    Neck Rotation    360 degree turn    Sitting down     Gait Abnormal Patient value  Initiation of gait    Step length-left    Step length-right    Step height-left    Step height-right    Step symmetry    Step continuity    Path deviation    Trunk movement    Walking stance        Annual Wellness Visit Requirements Recorded Today In  Medical, family, social history Past Medical, Family, Social History Section  Current providers Care team  Current medications Medications  Wt, BP, Ht, BMI Vital signs  Visual acuity (welcome visit) Had recent eye exam  Hearing assessment (welcome visit) Hearing/vision  Tobacco, alcohol, illicit drug use History  ADL Nurse Assessment  Depression Screening Nurse Assessment  Cognitive impairment Nurse Assessment  Mini Mental Status Document Flowsheet  Fall Risk Nurse Assessment  Home Safety Progress Note  End of Life Planning (welcome visit) Social  Documentation  Medicare preventative services Progress Note  Risk factors/conditions needing evaluation/treatment Progress Note  Personalized health advice Patient Instructions, goals, letter  Diet & Exercise Social Documentation  Emergency Contact Social Documentation  Seat Belts Social Documentation  Sun exposure/protection Social Documentation    Prevention Plan: Recommended pt schedule mammogram and contact pharmacy for shingles vaccine.    Recommended Medicare Prevention Screenings Women over 38 Test For Frequency Date of Last- BOLD if needed  Breast Cancer 1-2 yrs 10/10  Cervical Cancer 1-3 yrs Not indicated  Colorectal Cancer 1-10 yrs 7/07  Osteoporosis once   Cholesterol 5 yrs 12/09  Diabetes yearly 6/12  HIV yearly declined  Influenza Shot yearly   Pneumonia Shot once 10/08  Zostavax Shot once recommended   I have reviewed this visit and discussed with Arlys John and agree with her documentation Denny Levy

## 2010-10-03 ENCOUNTER — Encounter: Payer: Self-pay | Admitting: Family Medicine

## 2010-10-03 ENCOUNTER — Ambulatory Visit (INDEPENDENT_AMBULATORY_CARE_PROVIDER_SITE_OTHER): Payer: Medicare HMO | Admitting: Family Medicine

## 2010-10-03 ENCOUNTER — Other Ambulatory Visit: Payer: Self-pay | Admitting: Family Medicine

## 2010-10-03 VITALS — BP 128/80 | HR 76 | Temp 97.6°F | Ht 63.5 in | Wt 168.0 lb

## 2010-10-03 DIAGNOSIS — G20 Parkinson's disease: Secondary | ICD-10-CM

## 2010-10-03 DIAGNOSIS — I10 Essential (primary) hypertension: Secondary | ICD-10-CM

## 2010-10-03 DIAGNOSIS — F339 Major depressive disorder, recurrent, unspecified: Secondary | ICD-10-CM

## 2010-10-03 NOTE — Discharge Summary (Signed)
NAMEMAILI, SHUTTERS               ACCOUNT NO.:  [id number]  MEDICAL RECORD NO.:  [id number]  LOCATION:                                 FACILITY:  PHYSICIAN:  Leighton Roach Odilia Damico, M.D.DATE OF BIRTH:  July 17, 1945  DATE OF ADMISSION:  09/26/2010 DATE OF DISCHARGE:  09/27/2010                              DISCHARGE SUMMARY   DISCHARGE DIAGNOSES: 1. Parkinson disease. 2. Depression. 3. Anxiety. 4. Chronic pain. 5. Hyperlipidemia. 6. Gastroesophageal reflux disease. 7. Hypertension. 8. Diabetes mellitus. 9. Arthritis. 10.Seasonal allergies.  DISCHARGE MEDICATIONS: 1. Remeron 15 mg p.o. at bedtime. 2. Aspirin 81 mg p.o. daily. 3. Sinemet  25/100 mg 1 tablet p.o. t.i.d. 4. Hydrocodone/acetaminophen 5/500 mg tabs 1-2 tablets p.o. t.i.d.     p.r.n. 5. Metformin 850 mg p.o. b.i.d. with meals. 6. Omeprazole 20 mg p.o. b.i.d. 7. PreserVision eye vitamin OTC p.o. b.i.d. 8. Simvastatin 40 mg p.o. daily. 9. Norvasc 5 mg p.o. daily.  MEDICATIONS DISCONTINUED: 1. Trazodone 50 mg 2 tablets p.o. at bedtime. 2. Amlodipine 10 mg p.o. daily. 3. Hydrochlorothiazide 25 mg p.o. daily. 4. Clonidine.  LABORATORY DATA AND STUDIES: 1. TSH 0.425. 2. Hemoglobin A1c 5.7.  BRIEF HOSPITAL COURSE:  This is a 65 year old female with a history of Parkinson disease who was admitted for acute weakness. 1. Weakness.  This seemed to be a flare of the patient's Parkinson     disease, therefore, the patient was admitted to the hospital for     potential medication changes.  The patient was restarted on her     home dose of Sinemet and did regain ambulation and near her     baseline with strength after hospital day #1.  Neurology was not     consulted during this hospitalization, and plans were had for     outpatient followup per PCP.  Due to concern for polypharmacy, the     patient's trazodone was discontinued. Notably she had     recently discontinued an additional Parkinson's medication secondary to  hypotension.  On day of discharge, the patient had full ability for     ambulation and 4+/5 bilateral upper extremity strength with only     deficit being a mild upper extremity tremor. 2. Hypertension. As noted previously the patient had borderline     hypotension, therefore, several of her medications were     discontinued including hydrochlorothiazide and clonidine.  Her     Norvasc dose was also decreased to 5 mg.  On day of discharge, her     blood pressure has ranged from 105-120 systolic.  She will follow     up with her PCP for further titration. 3. Diabetes.  The patient had normal blood sugars during admission     with an A1c of 5.7.  She will restart her home dose of metformin     after discharge. 4. Depression and anxiety.  The patient's trazodone was discontinued     during this hospitalization, and she was started on Remeron 15 mg     at bedtime.  DISCHARGE INSTRUCTIONS:  The patient was discharged to home with instructions to call her MD for any increased difficulty walking, weakness, or  any other concerns.  FOLLOWUP APPOINTMENTS: 1. Dr. Jennette Kettle on October 04, 2010, as previously scheduled. 2. Dr. Sheffield Slider and [facility] clinic on October 03, 2010, at 10:00 a.m.  The patient was discharged to home in stable medical condition.    ______________________________ Lloyd Huger, MD   ______________________________ Leighton Roach Barbette Mcglaun, M.D.    JK/MEDQ  D:  09/28/2010  T:  09/28/2010  Job:  [id number]  Electronically Signed by Lloyd Huger MD on 10/01/2010 09:53:08 AM Electronically Signed by Acquanetta Belling M.D. on 10/03/2010 03:42:37 PM

## 2010-10-03 NOTE — Assessment & Plan Note (Signed)
I saw [name] with Luretha Murphy. Currently the tremor is minimal, no rigidity, positive reduced arm swing, but not wide-based or unstable. Neg Glabellar reflex. If remains intolerant and has recurrence of tremor, try Ropinarole in place of Sinemet

## 2010-10-03 NOTE — Patient Instructions (Signed)
Return apt with Dr. Sheffield Slider in [facility] July 5  Break the Sinemet in 1/2 and take 1/2 tab tid (6-1-8) Do not take with meals Make notes of tremor, circumstances are you upset etc, and stiffness or rigidity and if that gets worse with less mediciation

## 2010-10-03 NOTE — Assessment & Plan Note (Addendum)
She endorses strong emotional responses to stress throughout the years, first described all over tremors when her son died in September 21, 2003.  Tremor will need to be further assessed on less Sinemet and when she has an emotional response.  Asked her to be more mindful of such. Tolerating Remeron.

## 2010-10-03 NOTE — Assessment & Plan Note (Signed)
Only on amlodipine 5 mg, orthostatic BP checked and there were no changes (130/80 range)

## 2010-10-03 NOTE — Assessment & Plan Note (Addendum)
Today she did not exhibit significant findings consistent with Idiopathic parkinson's disease, but she had taken her Sinemet this morning.  Since she is having so much nausea and vomiting with Sinemet, will reduce Sinemet to 1/2 tab tid for 2 weeks and return visit with Dr. Sheffield Slider [facility] July [facility].  May consider stopping all together to see if clinical features become more evident. Greater than 40 minutes was spent with the patient in evaluation and review with Dr. Sheffield Slider

## 2010-10-03 NOTE — Progress Notes (Signed)
  Subjective:    Patient ID: [name], female    DOB: 09/05/1945, 65 y.o.   MRN: [id number]  HPI CC: nausea, associated with Sinemet; sometimes she vomits the medication, yesterday vomited twice.  HPI:  Ms. [name] gives a history of tremor on an off throughout the years when she becomes emotional.  She noted in December of 2011 a left sided tremor and was diagnosed with Parkinson's disease in February 2012 by Dr. Danae Orleans, and started on Sinemet.  She was then seen by him about a month ago and started on Azilect ( MAO B inhibitor).  She became weak, had difficulty getting out of a chair, and had a low BP.  She was admitted to the hospital for one day and Azilect and other meds that could be contributing to hypotension (trazodone and Clonidine were discontinued).  Her parkinson's tremor is left sided and she describes having difficulty writing.  She denies bradykinesia, rigidity, or postural instability. She denies profound wearing off or on-off phenomena.    She describes herself as a emotional person, in and around the time she was admitted to the hospital for weakness a friend of hers died.  She had suffered many tragic losses including the death of 2 children, she reports always shaking and being weak during those times.  She reports using benzodiazepines much of her life and gets them from a friend.  She took several valium at the time of her friends death 2 weeks ago, and seen by Dr. Jennette Kettle at that time.  Dr. Jennette Kettle counseled her regarding use of a friends medication and she denies using since.  Initial Medicare Wellness Visit completed yesterday; her MMSE was 28/30; her depression screen yesterday was negative.   Review of Systems  Neurological: Positive for dizziness, tremors, weakness and light-headedness. Negative for syncope.  Psychiatric/Behavioral: Positive for sleep disturbance. Negative for suicidal ideas. The patient is nervous/anxious.        Objective:   Physical Exam    Constitutional:       Well groomed, pleasant mood  Cardiovascular:       Orthostatic BP documented: no changes, not hypotensive  Neurological:       Alert and Oriented Facial symmetry, and movement Fine left hand and lip tremor teased out when walking and distracting Gait: narrow stance, reduced arm swing, no instability, continuous turning. Romberg:  Steady; Sharpened Romberg was unsteady. Barre: no drift  Psychiatric:       moderately anxious          Assessment & Plan:

## 2010-10-04 ENCOUNTER — Telehealth: Payer: Self-pay | Admitting: Family Medicine

## 2010-10-04 NOTE — Telephone Encounter (Signed)
Would like to speak with Dr. Jennette Kettle or the nurse about her Potassium.  Thought the prescription was going to be sent to [facility] on [address], but they have never gotten it.  She prefers the packets.

## 2010-10-07 MED ORDER — POTASSIUM CHLORIDE 25 MEQ PO PACK: PACK | ORAL | Status: DC

## 2010-10-07 NOTE — Telephone Encounter (Signed)
Spoke to patient and informed of below.

## 2010-10-07 NOTE — Telephone Encounter (Signed)
Dear [name] Team Yes I have sent it---it is a DIFFERENT DOSE from what she used to use--she takes it oNCE a day

## 2010-10-07 NOTE — Telephone Encounter (Signed)
Can you send in a rx for the packets

## 2010-10-08 ENCOUNTER — Telehealth: Payer: Self-pay | Admitting: Family Medicine

## 2010-10-08 NOTE — Telephone Encounter (Signed)
Pt is in a lot of pain and states that Dr Jennette Kettle is aware of what she is going thru.  Would like to talk to Dr Jennette Kettle or her nurse.

## 2010-10-08 NOTE — Telephone Encounter (Signed)
Lots of pain in her legs 7/10 worse in the morning. Not arthritis pain.Stated that the geriatric dr took her off of a lot of her meds. Not trembling near as much

## 2010-10-09 ENCOUNTER — Ambulatory Visit: Payer: Medicare HMO | Admitting: Family Medicine

## 2010-10-09 NOTE — Telephone Encounter (Signed)
[name] called her back and ok to use 1 vicodin in am and 2 at night prn Denny Levy

## 2010-10-17 ENCOUNTER — Encounter: Payer: Self-pay | Admitting: Family Medicine

## 2010-10-17 ENCOUNTER — Ambulatory Visit (INDEPENDENT_AMBULATORY_CARE_PROVIDER_SITE_OTHER): Payer: Medicare HMO | Admitting: Family Medicine

## 2010-10-17 DIAGNOSIS — I10 Essential (primary) hypertension: Secondary | ICD-10-CM

## 2010-10-17 DIAGNOSIS — G20C Parkinsonism, unspecified: Secondary | ICD-10-CM

## 2010-10-17 DIAGNOSIS — M79606 Pain in leg, unspecified: Secondary | ICD-10-CM

## 2010-10-17 DIAGNOSIS — M79609 Pain in unspecified limb: Secondary | ICD-10-CM

## 2010-10-17 DIAGNOSIS — G20 Parkinson's disease: Secondary | ICD-10-CM

## 2010-10-17 MED ORDER — PROPRANOLOL HCL ER 80 MG PO CP24: 80.0000 mg | ORAL_CAPSULE | Freq: Every day | ORAL | Status: DC

## 2010-10-17 NOTE — Progress Notes (Signed)
Subjective:    Patient ID: [name], female    DOB: Jan 20, 1946, 65 y.o.   MRN: [id number]  HPI 1. Parkinson's Disease - Tremor Patient has changed her Cinemet to 1/2 tab TID 2nd to side effects. She has noticed an improvement with her thinking and no N/V. She is well controlled on this with some break through tremors, but otherwise good functionality.   2. Leg pain She c/o lower ext. Pain in her muscles and knees on standing and walking that improves with rest. Does not occur at night. She has a normal neurovascular exam on both lower ext. She does not have classic claudication symptoms. She has no calf tenderness or asymetry, no pain on dorsiflexion. She does suffer from long standing arthritis for which she takes vicodin.   3. HTN Her blood pressure was elevated today. She did not take her BP meds this morning. Her last 7 blood pressure checks have been well controlled or hypotensive. She was recently taken off BP medication by her PCP for hypotension.    Review of Systems  Constitutional: Negative for fever, activity change, appetite change, fatigue and unexpected weight change.  HENT: Negative for hearing loss.   Eyes: Negative for visual disturbance.  Respiratory: Negative for chest tightness and shortness of breath.   Cardiovascular: Negative for chest pain, palpitations and leg swelling.  Gastrointestinal: Negative for nausea, abdominal pain and diarrhea.  Genitourinary: Negative for dysuria.  Musculoskeletal: Positive for myalgias, back pain, arthralgias and gait problem. Negative for joint swelling.  Skin: Negative for rash.  Neurological: Positive for tremors. Negative for dizziness, syncope, facial asymmetry, speech difficulty, weakness and headaches.  Psychiatric/Behavioral: Negative for sleep disturbance.       Objective:   Physical Exam  Constitutional: She is oriented to person, place, and time. No distress.  HENT:  Head: Normocephalic and atraumatic.    Eyes: Pupils are equal, round, and reactive to light.  Cardiovascular: Normal rate and regular rhythm.   No murmur heard. Pulmonary/Chest: Effort normal and breath sounds normal. No respiratory distress.  Abdominal: Soft. She exhibits no distension. There is no tenderness.  Musculoskeletal: Normal range of motion. She exhibits no edema and no tenderness.       Neurovascularly intact  Neurological: She is alert and oriented to person, place, and time. No cranial nerve deficit. Coordination normal.  Skin: Skin is warm. No rash noted.  Psychiatric: She has a normal mood and affect. Her behavior is normal.       Assessment & Plan:  1. Parkinson's Disease - Tremor Patient has changed her Cinemet to 1/2 tab TID 2nd to side effects. She has noticed an improvement with her thinking and no N/V. She is well controlled on this with some break through tremors, but otherwise good functionality.  - she is doing a lot better with the current cinemet dosing. - she is planning on seeing another neurologist at [facility].  2. Leg pain She c/o lower ext. Pain in her muscles and knees on standing and walking that improves with rest. Does not occur at night. She has a normal neurovascular exam on both lower ext. She does not have classic claudication symptoms. She has no calf tenderness or asymetry, no pain on dorsiflexion. She does suffer from long standing arthritis for which she takes vicodin.  - claudication/RLS unlikely, this appears to be part of her arthritis. Continue with pain medication PRN.  3. HTN Her blood pressure was elevated today. She did not take her BP meds  this morning. Her last 7 blood pressure checks have been well controlled or hypotensive. She was recently taken off BP medication by her PCP for hypotension.  - continue with Amlodipine 10 mg QD for now. Has an appointment with PCP in one month to reevaluate BP.

## 2010-10-17 NOTE — Progress Notes (Signed)
Addended by: Zachery Dauer on: 10/17/2010 10:38 AM   Modules accepted: Orders

## 2010-10-17 NOTE — Assessment & Plan Note (Signed)
She has a positive Myerson's sign and the hand tremor worsens with walking, but no rigidity or postural abnormalities despite not taking Sinemet today, thus we will try treating postural component of tremor. She is considering going to see a neurologist at [facility] that her friend sees. If she develops restless leg type symptoms, she could be tried on Ropinerol which would treat both.
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
## 2010-11-13 ENCOUNTER — Encounter: Payer: Self-pay | Admitting: Family Medicine

## 2010-11-13 ENCOUNTER — Ambulatory Visit (INDEPENDENT_AMBULATORY_CARE_PROVIDER_SITE_OTHER): Payer: Medicare HMO | Admitting: Family Medicine

## 2010-11-13 VITALS — BP 116/82 | HR 72 | Temp 97.6°F | Ht 63.5 in | Wt 174.7 lb

## 2010-11-13 DIAGNOSIS — G20A1 Parkinson's disease without dyskinesia, without mention of fluctuations: Secondary | ICD-10-CM

## 2010-11-13 DIAGNOSIS — G20 Parkinson's disease: Secondary | ICD-10-CM

## 2010-11-14 ENCOUNTER — Other Ambulatory Visit: Payer: Self-pay | Admitting: Family Medicine

## 2010-11-14 NOTE — Telephone Encounter (Signed)
Refill request

## 2010-11-15 ENCOUNTER — Other Ambulatory Visit: Payer: Self-pay | Admitting: Family Medicine

## 2010-11-15 NOTE — Progress Notes (Signed)
  Subjective:    Patient ID: [name], female    DOB: 23-Aug-1945, 65 y.o.   MRN: [id number]  HPI  Doing really well w current dose of meds Feels less stiff, more mentallty alert  tolerating teh propranolol without dizziness  She feels so well she has changed her mind about seeking another opinion at [facility]  Review of Systems    Pertinent review of systems: negative for fever or unusual weight change. See HPI  Objective:   Physical Exam   GENERALl: Well developed, well nourished, in no acute distress. NECK: Supple, FROM, without lymphadenopathy.  THYROID: normal without nodularity CAROTID ARTERIES: without bruits LUNGS: clear to auscultation bilaterally. No wheezes or rales. HEART: Regular rate and rhythm, no murmurs ABDOMEN: soft with positive bowel sounds NEURO:some rest tremor and orofacial tremor. Gait is a little wide based but steadier      Assessment & Plan:  Parkinsonism with improvement of symptoms No med changes rtc 4-6 w

## 2010-11-15 NOTE — Telephone Encounter (Signed)
Refill request

## 2010-11-26 ENCOUNTER — Telehealth: Payer: Self-pay | Admitting: Family Medicine

## 2010-11-26 MED ORDER — ONDANSETRON HCL 4 MG PO TABS: 4.0000 mg | ORAL_TABLET | Freq: Three times a day (TID) | ORAL | Status: AC | PRN

## 2010-11-26 NOTE — Telephone Encounter (Signed)
Spoke with patient and informed of below. Spoke with pharmacy and they are going to fax request over one more time

## 2010-11-26 NOTE — Telephone Encounter (Signed)
Dr. Jennette Kettle,  Patient says that she had spoken with you and had been told you would prescribe her something for nausea. Is this correct? ----[name]

## 2010-11-26 NOTE — Telephone Encounter (Signed)
Pt called to say that Dr Jennette Kettle was supposed to call in nausea meds and also had a problem with her Vicodin- states that pharmacy has tried to contact the doctor with no response. [facility] [place]

## 2010-11-26 NOTE — Telephone Encounter (Signed)
Dear Cliffton Asters Team Please cal her pharmacy and see what the issue is with the vicodin. And then I tell her it was my mistake with the nausea medicine---I HAVE called that in correctly now and my apologies. THANKS! Denny Levy

## 2010-12-04 ENCOUNTER — Encounter: Payer: Self-pay | Admitting: Family Medicine

## 2010-12-04 ENCOUNTER — Telehealth: Payer: Self-pay | Admitting: Family Medicine

## 2010-12-04 NOTE — Telephone Encounter (Signed)
[name] need a letter from you to take to her dentist office stating her medical condition and that she is taking aspirin for blood thinner.  Please let her know when ready to pick up.  Need this before Monday.

## 2010-12-27 ENCOUNTER — Other Ambulatory Visit: Payer: Self-pay | Admitting: Family Medicine

## 2010-12-27 NOTE — Telephone Encounter (Signed)
Refill request

## 2011-01-06 ENCOUNTER — Ambulatory Visit (INDEPENDENT_AMBULATORY_CARE_PROVIDER_SITE_OTHER): Payer: Medicare HMO | Admitting: Family Medicine

## 2011-01-06 DIAGNOSIS — R109 Unspecified abdominal pain: Secondary | ICD-10-CM

## 2011-01-06 DIAGNOSIS — E119 Type 2 diabetes mellitus without complications: Secondary | ICD-10-CM

## 2011-01-06 NOTE — Progress Notes (Signed)
  Subjective:    Patient ID: [name], female    DOB: December 20, 1945, 65 y.o.   MRN: [id number]  HPI Abdominal pain: Patient reports the worked on the pain x5 days, cramping in nature, occasional pain in left flank area, has been off and on and varies in intensity, positive nausea vomiting-multiple episodes on Thursday-no episodes of nausea vomiting over weekend. Has been able to eat and drink light meals, mostly liquids since Friday. Positive decreased appetite no body aches/chills. No fever. Patient does have a history of diverticulosis found on colonoscopy-but has not had any episodes of pain from this. Patient reports chronic diarrhea but she controls with Imodium.   Review of Systems    as per above. Objective:   Physical Exam  Constitutional: She is oriented to person, place, and time. She appears well-developed and well-nourished.  HENT:  Nose: Nose normal.  Mouth/Throat: Oropharynx is clear and moist. No oropharyngeal exudate.  Eyes: Pupils are equal, round, and reactive to light.  Cardiovascular: Normal rate, regular rhythm and normal heart sounds.   No murmur heard. Pulmonary/Chest: Effort normal. No respiratory distress. She has no wheezes.  Abdominal: Soft. Bowel sounds are normal. She exhibits no distension and no mass. There is tenderness. There is no rebound and no guarding.       Positive tenderness to palpation in lower abdomen.  Musculoskeletal: She exhibits no edema.  Neurological: She is alert and oriented to person, place, and time.  Skin: No rash noted.  Psychiatric: She has a normal mood and affect. Her behavior is normal.          Assessment & Plan:

## 2011-01-06 NOTE — Assessment & Plan Note (Addendum)
Diagnosis unclear. Pain may be due to diverticulosis/diverticulitis versus viral etiology versus other etiology. No fever.  Discussed this case with patient's PCP since she has extensive medical history. PCP recommends CT imaging of abdomen and pelvis in the setting of cough with complex medical history, and the fact that this is a very atypical presentation and complaint for this patient.  Since abdominal pain is persistent we'll proceed with CT imaging. Scheduled for tomorrow a.m. 8:15. Patient to return or go to the emergency department if any new or worsening symptoms.

## 2011-01-06 NOTE — Patient Instructions (Addendum)
Go get CT scan of your abdomen and pelvis.  I will call you with your results. Tomorrow morning at 8:15-  Drink contrast as directed and pick up contrast today at radiology appointment.

## 2011-01-07 ENCOUNTER — Other Ambulatory Visit: Payer: Self-pay | Admitting: Family Medicine

## 2011-01-07 ENCOUNTER — Ambulatory Visit (HOSPITAL_COMMUNITY)
Admission: RE | Admit: 2011-01-07 | Discharge: 2011-01-07 | Disposition: A | Discharge status: Home / Self Care | Payer: Medicare HMO | Source: Ambulatory Visit | Attending: Family Medicine | Admitting: Family Medicine

## 2011-01-07 DIAGNOSIS — K7689 Other specified diseases of liver: Secondary | ICD-10-CM | POA: Insufficient documentation

## 2011-01-07 DIAGNOSIS — R109 Unspecified abdominal pain: Secondary | ICD-10-CM | POA: Insufficient documentation

## 2011-01-07 DIAGNOSIS — K5730 Diverticulosis of large intestine without perforation or abscess without bleeding: Secondary | ICD-10-CM | POA: Insufficient documentation

## 2011-01-07 MED ORDER — CIPROFLOXACIN HCL 500 MG PO TABS: 500.0000 mg | ORAL_TABLET | Freq: Two times a day (BID) | ORAL | Status: AC

## 2011-01-07 MED ORDER — METRONIDAZOLE 500 MG PO TABS: 500.0000 mg | ORAL_TABLET | Freq: Three times a day (TID) | ORAL | Status: AC

## 2011-01-07 NOTE — Progress Notes (Signed)
Dear Cliffton Asters Team Please call her and tell her the CT scan shows some infection in her bowel. I want tostart her on TWO antibiotics--I have called them in--I need to see her next week--OK to double book her. She should follow a bland diet, call if she is gettig worse with abdominal pain or new fever etc. She is to take both abx ----one is twce a day and one is three times a day

## 2011-01-08 ENCOUNTER — Telehealth: Payer: Self-pay | Admitting: *Deleted

## 2011-01-08 NOTE — Telephone Encounter (Signed)
Denny Levy, MD 01/07/2011 11:50 AM Pended  Dear [name] Team  Please call her and tell her the CT scan shows some infection in her bowel. I want tostart her on TWO antibiotics--I have called them in--I need to see her next week--OK to double book her. She should follow a bland diet, call if she is gettig worse with abdominal pain or new fever etc.  She is to take both abx ----one is twce a day and one is three times a day

## 2011-01-08 NOTE — Telephone Encounter (Signed)
Spoke with patient and informed her of below. She has an appointment with dr. Jennette Kettle coming up

## 2011-01-09 ENCOUNTER — Other Ambulatory Visit (HOSPITAL_COMMUNITY): Payer: Medicare HMO

## 2011-01-09 LAB — URINALYSIS, ROUTINE W REFLEX MICROSCOPIC
Bilirubin Urine: NEGATIVE
Glucose, UA: NEGATIVE
Hgb urine dipstick: NEGATIVE
Ketones, ur: 15 — AB
Nitrite: NEGATIVE
Protein, ur: NEGATIVE
Specific Gravity, Urine: 1.027
Urobilinogen, UA: 1
pH: 5.5

## 2011-01-09 LAB — POCT I-STAT, CHEM 8
BUN: 23
Calcium, Ion: 1.16
Chloride: 110
Creatinine, Ser: 1
Glucose, Bld: 134 — ABNORMAL HIGH
HCT: 39
Hemoglobin: 13.3
Potassium: 3.4 — ABNORMAL LOW
Sodium: 141
TCO2: 20

## 2011-01-09 LAB — CARDIAC PANEL(CRET KIN+CKTOT+MB+TROPI)
CK, MB: 2.5
Relative Index: INVALID
Total CK: 37
Troponin I: 0.01

## 2011-01-09 LAB — CK TOTAL AND CKMB (NOT AT ARMC)
CK, MB: 2.7
Relative Index: INVALID
Total CK: 43

## 2011-01-09 LAB — BASIC METABOLIC PANEL
BUN: 21
CO2: 25
Calcium: 9.4
Chloride: 109
Creatinine, Ser: 0.94
GFR calc Af Amer: >60
GFR calc non Af Amer: >60
Glucose, Bld: 173 — ABNORMAL HIGH
Potassium: 4.1
Sodium: 142

## 2011-01-09 LAB — CBC
HCT: 35 — ABNORMAL LOW
Hemoglobin: 11.9 — ABNORMAL LOW
MCHC: 34
MCV: 93.7
Platelets: 373
RBC: 3.74 — ABNORMAL LOW
RDW: 12.7
WBC: 17.8 — ABNORMAL HIGH

## 2011-01-09 LAB — POCT CARDIAC MARKERS
CKMB, poc: 2.1
Myoglobin, poc: 129
Operator id: 146091
Troponin i, poc: <0.05

## 2011-01-09 LAB — D-DIMER, QUANTITATIVE: D-Dimer, Quant: 0.3

## 2011-01-09 LAB — TSH: TSH: 0.491

## 2011-01-09 LAB — RAPID URINE DRUG SCREEN, HOSP PERFORMED
Amphetamines: NOT DETECTED
Barbiturates: NOT DETECTED
Benzodiazepines: POSITIVE — AB
Cocaine: NOT DETECTED
Opiates: NOT DETECTED
Tetrahydrocannabinol: NOT DETECTED

## 2011-01-09 LAB — TROPONIN I: Troponin I: 0.03

## 2011-01-10 ENCOUNTER — Telehealth: Payer: Self-pay | Admitting: Family Medicine

## 2011-01-10 NOTE — Telephone Encounter (Signed)
LVM informing of below 

## 2011-01-10 NOTE — Telephone Encounter (Signed)
Dear Cliffton Asters Team Tell her to stop the flagyl and just try taking the cipro THANKS! Denny Levy

## 2011-01-10 NOTE — Telephone Encounter (Signed)
Pt can't take her ABX - she keeps throwing it up.

## 2011-01-15 ENCOUNTER — Encounter: Payer: Self-pay | Admitting: Family Medicine

## 2011-01-15 ENCOUNTER — Ambulatory Visit (INDEPENDENT_AMBULATORY_CARE_PROVIDER_SITE_OTHER): Payer: Medicare HMO | Admitting: Family Medicine

## 2011-01-15 VITALS — BP 152/81 | HR 65 | Temp 97.8°F | Ht 64.0 in | Wt 175.0 lb

## 2011-01-15 DIAGNOSIS — E119 Type 2 diabetes mellitus without complications: Secondary | ICD-10-CM

## 2011-01-15 DIAGNOSIS — M1710 Unilateral primary osteoarthritis, unspecified knee: Secondary | ICD-10-CM

## 2011-01-15 DIAGNOSIS — Z23 Encounter for immunization: Secondary | ICD-10-CM

## 2011-01-15 DIAGNOSIS — IMO0002 Reserved for concepts with insufficient information to code with codable children: Secondary | ICD-10-CM

## 2011-01-15 DIAGNOSIS — R5381 Other malaise: Secondary | ICD-10-CM

## 2011-01-15 LAB — POCT GLYCOSYLATED HEMOGLOBIN (HGB A1C): Hemoglobin A1C: 5.3

## 2011-01-15 MED ORDER — CYANOCOBALAMIN 1000 MCG/ML IJ SOLN
1000.0000 ug | Freq: Once | INTRAMUSCULAR | Status: AC
  Administered 2011-01-15: 1000 ug via INTRAMUSCULAR

## 2011-01-15 NOTE — Progress Notes (Signed)
  Subjective:    Patient ID: [name], female    DOB: 02-20-1946, 65 y.o.   MRN: [id number]  HPI  1. F/u abdominal pain---comlpetely  Resolved. She did not complete her abx as they made her throw up. Probably got 3 - 4 days total. 2. Left knee pain is worse---has not had a shot since April---wants one now. 3. F/u parkinsonismm---doing really well w current meds---pretty good energy level  Review of Systems Pertinent review of systems: negative for fever or unusual weight change. See hpi    Objective:   Physical Exam  GENERAL: Well-developed, well-nourished, no acute distress. CARDIOVASCULAR: Regular rate and rhythm no murmur gallop or rub LUNGS: Clear to auscultation bilaterally, no rales or wheeze. ABDOMEN: Soft positive bowel sounds NEURO: rsting tremor hand / head. No cog wheel rigidity noted KNEE left --lacks 10 degrees full extension. TTP medial and lateral joint lines. No effusion no redness and no warmth. EXT calf is soft  INJECTION: Patient was given informed consent, signed copy in the chart. Appropriate time out was taken. Area prepped and draped in usual sterile fashion. 1 cc of kenalog plus  4 cc of lidocaine was injected into the left knee  using a(n) anterior  approach. The patient tolerated the procedure well. There were no complications. Post procedure instructions were given.          Assessment & Plan:  1. Diverticulitis tresolved despite not finishing abx regimen. 2. Oa left knee--injection 3. parkinsoniism--continue current meds Flu vaccine pnumovax today rx for zostavax givem B 12 given

## 2011-01-16 ENCOUNTER — Encounter: Payer: Self-pay | Admitting: Family Medicine

## 2011-02-05 ENCOUNTER — Encounter: Payer: Self-pay | Admitting: Family Medicine

## 2011-02-05 NOTE — Progress Notes (Signed)
Received Health Risk Assessment from [employer] Reviewed data. Depression Screen PHQ-9 score zero. Rest of info reviewed--no new information.

## 2011-02-27 ENCOUNTER — Ambulatory Visit: Payer: Medicare HMO

## 2011-03-27 ENCOUNTER — Other Ambulatory Visit: Payer: Self-pay | Admitting: Family Medicine

## 2011-03-27 ENCOUNTER — Ambulatory Visit: Payer: Medicare HMO

## 2011-03-27 NOTE — Telephone Encounter (Signed)
Refill request

## 2011-04-01 ENCOUNTER — Telehealth: Payer: Self-pay | Admitting: Family Medicine

## 2011-04-01 NOTE — Telephone Encounter (Signed)
Checking status of a parkinsons questionnaire that was faxed to [place] several times, needs this for life ins purposes, it was originally faxed on 11/30, 12/12, and faxing again today, would like call back.

## 2011-04-01 NOTE — Telephone Encounter (Signed)
Dear Cliffton Asters Team This is the FIRST one I have seen---it has been filled out and will be faxed now [facility]! Denny Levy

## 2011-04-24 ENCOUNTER — Ambulatory Visit: Payer: Medicare HMO | Admitting: Psychology

## 2011-04-24 ENCOUNTER — Ambulatory Visit (INDEPENDENT_AMBULATORY_CARE_PROVIDER_SITE_OTHER): Payer: Medicare HMO | Admitting: Family Medicine

## 2011-04-24 VITALS — BP 138/87 | HR 93 | Temp 97.6°F | Wt 177.0 lb

## 2011-04-24 DIAGNOSIS — E119 Type 2 diabetes mellitus without complications: Secondary | ICD-10-CM

## 2011-04-24 DIAGNOSIS — R5381 Other malaise: Secondary | ICD-10-CM

## 2011-04-24 LAB — POCT GLYCOSYLATED HEMOGLOBIN (HGB A1C): Hemoglobin A1C: 5.3

## 2011-04-24 MED ORDER — CYANOCOBALAMIN 1000 MCG/ML IJ SOLN
1000.0000 ug | Freq: Once | INTRAMUSCULAR | Status: AC
  Administered 2011-04-24: 1000 ug via INTRAMUSCULAR

## 2011-04-25 MED ORDER — CARBIDOPA-LEVODOPA 25-100 MG PO TABS: ORAL_TABLET | ORAL | Status: DC

## 2011-04-25 MED ORDER — PROPRANOLOL HCL ER 120 MG PO CP24: 120.0000 mg | ORAL_CAPSULE | Freq: Every day | ORAL | Status: DC

## 2011-04-25 NOTE — Progress Notes (Signed)
  Subjective:    Patient ID: [name], female    DOB: [birth year], 66 y.o.   MRN: [id number]  HPI  #1. Wants injection in her knee. Injections have worked well for her in the past. She is not ready yet to get his knee replaced as she did her other one. Setting a lot of increased pain particularly tragic walk up steps. #2. Having some throat pain over the last couple of days it is scratchy and dry. No difficulty swallowing. Also has questions about why she seems somewhat worse all the time. #3. Has a lot of questions about her diagnosis of Parkinson's. She has changed her medications to taking 2 tablets a day one in the morning and one in the afternoon and this seems to work fairly well with her. #4. He is here with her daughter who relates that frequently when she comes by to see her mom in the afternoons, [name] is somnolent. [name] says this is because she gets up at early hours get her husband  Review of Systems     Objective:   Physical Exam        Assessment & Plan:

## 2011-05-16 ENCOUNTER — Telehealth: Payer: Self-pay | Admitting: Family Medicine

## 2011-05-16 ENCOUNTER — Other Ambulatory Visit: Payer: Self-pay | Admitting: Family Medicine

## 2011-05-16 MED ORDER — HYDROCODONE-ACETAMINOPHEN 5-500 MG PO TABS: ORAL_TABLET | ORAL | Status: DC

## 2011-05-16 NOTE — Telephone Encounter (Signed)
Needs a refill on Vicodin and something for nausea sent to [facility] on [address].

## 2011-05-16 NOTE — Telephone Encounter (Signed)
Dear White Team Please call this in and let her know THANKS! Clydell Alberts  

## 2011-05-16 NOTE — Telephone Encounter (Signed)
Called in rx and called patient to inform of this

## 2011-05-16 NOTE — Telephone Encounter (Signed)
Refill request

## 2011-05-30 ENCOUNTER — Ambulatory Visit: Payer: Medicare HMO | Admitting: Family Medicine

## 2011-06-02 ENCOUNTER — Encounter (INDEPENDENT_AMBULATORY_CARE_PROVIDER_SITE_OTHER): Payer: Medicare HMO | Admitting: Ophthalmology

## 2011-06-09 ENCOUNTER — Encounter (INDEPENDENT_AMBULATORY_CARE_PROVIDER_SITE_OTHER): Payer: Medicare HMO | Admitting: Ophthalmology

## 2011-06-09 DIAGNOSIS — H3530 Unspecified macular degeneration: Secondary | ICD-10-CM

## 2011-06-09 DIAGNOSIS — H26499 Other secondary cataract, unspecified eye: Secondary | ICD-10-CM

## 2011-06-09 DIAGNOSIS — H43819 Vitreous degeneration, unspecified eye: Secondary | ICD-10-CM

## 2011-07-22 ENCOUNTER — Telehealth: Payer: Self-pay | Admitting: Family Medicine

## 2011-07-22 ENCOUNTER — Emergency Department (HOSPITAL_COMMUNITY)
Admission: EM | Admit: 2011-07-22 | Discharge: 2011-07-22 | Disposition: A | Discharge status: Home / Self Care | Payer: Medicare HMO | Attending: Emergency Medicine | Admitting: Emergency Medicine

## 2011-07-22 ENCOUNTER — Emergency Department (HOSPITAL_COMMUNITY): Payer: Medicare HMO

## 2011-07-22 ENCOUNTER — Encounter (HOSPITAL_COMMUNITY): Payer: Self-pay | Admitting: Emergency Medicine

## 2011-07-22 DIAGNOSIS — S8990XA Unspecified injury of unspecified lower leg, initial encounter: Secondary | ICD-10-CM | POA: Insufficient documentation

## 2011-07-22 DIAGNOSIS — E119 Type 2 diabetes mellitus without complications: Secondary | ICD-10-CM | POA: Insufficient documentation

## 2011-07-22 DIAGNOSIS — S8991XA Unspecified injury of right lower leg, initial encounter: Secondary | ICD-10-CM

## 2011-07-22 DIAGNOSIS — Z21 Asymptomatic human immunodeficiency virus [HIV] infection status: Secondary | ICD-10-CM | POA: Insufficient documentation

## 2011-07-22 DIAGNOSIS — Z79899 Other long term (current) drug therapy: Secondary | ICD-10-CM | POA: Insufficient documentation

## 2011-07-22 DIAGNOSIS — M25569 Pain in unspecified knee: Secondary | ICD-10-CM | POA: Insufficient documentation

## 2011-07-22 DIAGNOSIS — S99929A Unspecified injury of unspecified foot, initial encounter: Secondary | ICD-10-CM | POA: Insufficient documentation

## 2011-07-22 DIAGNOSIS — Z7982 Long term (current) use of aspirin: Secondary | ICD-10-CM | POA: Insufficient documentation

## 2011-07-22 DIAGNOSIS — E785 Hyperlipidemia, unspecified: Secondary | ICD-10-CM | POA: Insufficient documentation

## 2011-07-22 DIAGNOSIS — R51 Headache: Secondary | ICD-10-CM | POA: Insufficient documentation

## 2011-07-22 DIAGNOSIS — R269 Unspecified abnormalities of gait and mobility: Secondary | ICD-10-CM | POA: Insufficient documentation

## 2011-07-22 DIAGNOSIS — F341 Dysthymic disorder: Secondary | ICD-10-CM | POA: Insufficient documentation

## 2011-07-22 DIAGNOSIS — M25559 Pain in unspecified hip: Secondary | ICD-10-CM | POA: Insufficient documentation

## 2011-07-22 DIAGNOSIS — M129 Arthropathy, unspecified: Secondary | ICD-10-CM | POA: Insufficient documentation

## 2011-07-22 DIAGNOSIS — Z96659 Presence of unspecified artificial knee joint: Secondary | ICD-10-CM | POA: Insufficient documentation

## 2011-07-22 DIAGNOSIS — W03XXXA Other fall on same level due to collision with another person, initial encounter: Secondary | ICD-10-CM | POA: Insufficient documentation

## 2011-07-22 MED ORDER — KETOROLAC TROMETHAMINE 60 MG/2ML IM SOLN
60.0000 mg | Freq: Once | INTRAMUSCULAR | Status: AC
  Administered 2011-07-22: 60 mg via INTRAMUSCULAR
  Filled 2011-07-22: qty 2

## 2011-07-22 MED ORDER — NAPROXEN 500 MG PO TABS: 500.0000 mg | ORAL_TABLET | Freq: Two times a day (BID) | ORAL | Status: DC

## 2011-07-22 NOTE — Telephone Encounter (Signed)
Pt has an appt tomorrow w/ Jennette Kettle and daughter is asking to speak with Dr Jennette Kettle before then.  She is concerned about her mothers mental health and needs to talk to [place].

## 2011-07-22 NOTE — Discharge Instructions (Signed)
Your x-ray shows that you have severe arthritis in your left knee. Please call your orthopedist for followup this week. There is no fractures or dislocations. You may use the knee immobilizer for support. Take Naprosyn twice a day
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
## 2011-07-22 NOTE — ED Notes (Signed)
D/c instructions reviewed w/ pt and family - pt and family deny any further questions or concerns at present.\ 
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
## 2011-07-22 NOTE — ED Notes (Signed)
Patient transported to X-ray 
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
## 2011-07-23 ENCOUNTER — Telehealth: Payer: Self-pay | Admitting: Family Medicine

## 2011-07-23 ENCOUNTER — Encounter: Payer: Self-pay | Admitting: Family Medicine

## 2011-07-23 ENCOUNTER — Ambulatory Visit (INDEPENDENT_AMBULATORY_CARE_PROVIDER_SITE_OTHER): Payer: Medicare HMO | Admitting: Family Medicine

## 2011-07-23 VITALS — BP 167/86 | HR 60 | Temp 97.9°F | Ht 64.0 in | Wt 177.6 lb

## 2011-07-23 DIAGNOSIS — M1710 Unilateral primary osteoarthritis, unspecified knee: Secondary | ICD-10-CM

## 2011-07-23 DIAGNOSIS — IMO0002 Reserved for concepts with insufficient information to code with codable children: Secondary | ICD-10-CM

## 2011-07-23 DIAGNOSIS — R5381 Other malaise: Secondary | ICD-10-CM

## 2011-07-23 DIAGNOSIS — F438 Other reactions to severe stress: Secondary | ICD-10-CM

## 2011-07-23 DIAGNOSIS — E119 Type 2 diabetes mellitus without complications: Secondary | ICD-10-CM

## 2011-07-23 DIAGNOSIS — F430 Acute stress reaction: Secondary | ICD-10-CM

## 2011-07-23 DIAGNOSIS — F1910 Other psychoactive substance abuse, uncomplicated: Secondary | ICD-10-CM

## 2011-07-23 DIAGNOSIS — R5383 Other fatigue: Secondary | ICD-10-CM

## 2011-07-23 LAB — POCT GLYCOSYLATED HEMOGLOBIN (HGB A1C): Hemoglobin A1C: 5.6

## 2011-07-23 MED ORDER — CYANOCOBALAMIN 1000 MCG/ML IJ SOLN
1000.0000 ug | Freq: Once | INTRAMUSCULAR | Status: AC
  Administered 2011-07-23: 1000 ug via INTRAMUSCULAR

## 2011-07-23 MED ORDER — HYDROCODONE-ACETAMINOPHEN 5-500 MG PO TABS: ORAL_TABLET | ORAL | Status: DC

## 2011-07-23 NOTE — Patient Instructions (Signed)
Please schedule [name] to see me in 2 weeks . OK to dbl book or put on Thursday [facility] clinic if needed

## 2011-07-23 NOTE — Telephone Encounter (Signed)
Encounter closed accidentally.  - needs this asap

## 2011-07-23 NOTE — Telephone Encounter (Signed)
Wants to be set up for counseling and PT - needs asap

## 2011-07-23 NOTE — Telephone Encounter (Signed)
Please call to discuss her mom Ms. Frieson.  Want to tell her about some things her mom did not discuss in her visit.

## 2011-07-24 NOTE — Telephone Encounter (Signed)
Fwd. To PCP for order

## 2011-07-25 ENCOUNTER — Telehealth: Payer: Self-pay | Admitting: Family Medicine
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
## 2011-07-25 NOTE — Telephone Encounter (Signed)
Said her mom seemed a lot more calm after her appointment yesterday. She also wanted to make sure that her mom and told me she had been doing some falling, mostly because her feet seem to get stuck. I reassured her that we had discussed these issues.

## 2011-07-25 NOTE — Telephone Encounter (Signed)
Dear Cliffton Asters Team I received message that she wanted me to refer her for counseling----- tell her to call her INSURANCE company phone number on back of her card---they will have a list of WHO is on her insurance plan--I do not have that list. She should NOT need a referral from us--she can make the appt herself. THANKS! Denny Levy

## 2011-07-25 NOTE — Telephone Encounter (Signed)
Called pt and she was unavailable so spoke with her daughter Marcelino Duster) and told her to inform her mother that she will need to contact her insurance company to find out who she can go see for counseling.  She stated that she would tell her.Loralee Pacas [place]

## 2011-07-28 ENCOUNTER — Telehealth: Payer: Self-pay | Admitting: *Deleted

## 2011-07-28 NOTE — Telephone Encounter (Signed)
Pharmacy calling to ask about Rx for generic Vicodin 5/500 written on 07/23/2011.  States it is too early to fill.  Patient asked pharmacy to call and get the okay to fill early.  Last RX for Vicodin filled 07/10/2011 for a 22 day supply written by Dr Jennette Kettle.   [name] with Dr. [place] (preceptor) and she okayed the early refill.  Pharmacy notified.  [name]

## 2011-08-06 ENCOUNTER — Encounter: Payer: Self-pay | Admitting: Family Medicine

## 2011-08-06 ENCOUNTER — Ambulatory Visit (INDEPENDENT_AMBULATORY_CARE_PROVIDER_SITE_OTHER): Payer: Medicare HMO | Admitting: Family Medicine

## 2011-08-06 VITALS — BP 135/80 | HR 72 | Temp 97.6°F | Ht 64.0 in | Wt 177.4 lb

## 2011-08-06 DIAGNOSIS — E119 Type 2 diabetes mellitus without complications: Secondary | ICD-10-CM

## 2011-08-06 DIAGNOSIS — F339 Major depressive disorder, recurrent, unspecified: Secondary | ICD-10-CM

## 2011-08-06 DIAGNOSIS — G20 Parkinson's disease: Secondary | ICD-10-CM

## 2011-08-06 LAB — POCT UA - MICROALBUMIN
Albumin/Creatinine Ratio, Urine, POC: 30
Creatinine, POC: 200 mg/dL
Microalbumin Ur, POC: 30 mg/dL

## 2011-08-06 MED ORDER — METFORMIN HCL 850 MG PO TABS: 850.0000 mg | ORAL_TABLET | Freq: Two times a day (BID) | ORAL | Status: DC

## 2011-08-06 MED ORDER — HYDROCODONE-ACETAMINOPHEN 5-500 MG PO TABS: ORAL_TABLET | ORAL | Status: DC

## 2011-08-06 NOTE — Patient Instructions (Signed)
I will send you a letter about your urine test. I am proud of you for working so hard! Let me see you back in 4-6 weeks or so

## 2011-08-11 ENCOUNTER — Encounter: Payer: Self-pay | Admitting: Family Medicine

## 2011-08-11 ENCOUNTER — Telehealth: Payer: Self-pay | Admitting: Family Medicine

## 2011-08-11 NOTE — Progress Notes (Signed)
  Subjective:    Patient ID: [name], female    DOB: 08/10/1945, 66 y.o.   MRN: [id number]  HPI  Followup recent stressors. She has started counseling. Her counselor has recommended that she see a psychiatrist for depression and anxiety. She wants to note black thing about that. She denies any suicidal or homicidal ideation. She and her husband are both willing to go to counseling. She feels safe in her home currently. She has arranged for respite care for her dependent side brain-dead 2 nights a week and this is helping significantly.  Review of Systems See history of present illness.    Objective:   Physical Exam  GENERAL: Well-developed female in no acute distress.  NEURO: Resting tremor of the right hand it is pill rolling. Mouth tremor. Mild head tremor. A little stiff with getting up from a chair but needs no assistance from me. Once started, her gait is normal. PSYCHIATRIC: Alert and oriented x4. Occasionally tearful but asks and answers questions appropriately. Normal thought content. Affect is interactive.      Assessment & Plan:  #1. Recent stressors. Long discussion with her about options. She'll lose to the fact that she may have told the therapist something says she has not told me. I agree with evaluation by psychiatrist and we discussed at length spending greater than 50% of her 40 minute office visit in counseling and education regarding this. #2 diabetes mellitus. I would like to do her microalbumin test today as we did not get it done at last office visit. I will see her back in followup 4-6 weeks.

## 2011-08-11 NOTE — Telephone Encounter (Signed)
Patient wants to speak to the nurse about seeing the Psychiatrist as suggested by Dr. Jennette Kettle.

## 2011-08-12 NOTE — Telephone Encounter (Signed)
Dr. Jennette Kettle, I spoke with patient and she stated that she has spoken with you about seeing another physiatrist that will and can prescribe her the meds she needs/requested.

## 2011-08-13 ENCOUNTER — Other Ambulatory Visit: Payer: Self-pay | Admitting: Family Medicine

## 2011-08-13 NOTE — Telephone Encounter (Signed)
[name] Her INSURANCE dictates who she can see for a psychiatrist---I agreed with both options she had at last ov. We DO NOT have a psychiatrist here---as we discussed it might be best for her to have a psychiatrist do a med evaluation for her given her parkinsonism. Has she changed her mind about hat? I would really like her to go forward with the psychiatry eval Let me know what it is I need to do to help her with this [facility]! Denny Levy

## 2011-08-15 NOTE — Telephone Encounter (Signed)
Spoke with patient and told her to check with her insurance to see which Psych. she can see

## 2011-09-02 ENCOUNTER — Telehealth: Payer: Self-pay | Admitting: Family Medicine

## 2011-09-02 NOTE — Telephone Encounter (Signed)
Patient is calling to speak to Dr. Jennette Kettle about being called for [facility].  She has been called to go 7/29 and there is no way she can do this so she will need a note.
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
## 2011-09-09 NOTE — Telephone Encounter (Signed)
Spoke with patient and she stated that her husband is coming in tomorrow for OV and she will bring the paper at that time with the information on it.

## 2011-09-10 NOTE — Telephone Encounter (Signed)
Brought paperwork today at husbands office visit

## 2011-09-16 ENCOUNTER — Encounter: Payer: Self-pay | Admitting: Family Medicine

## 2011-09-17 ENCOUNTER — Ambulatory Visit (INDEPENDENT_AMBULATORY_CARE_PROVIDER_SITE_OTHER): Payer: Medicare HMO | Admitting: Family Medicine

## 2011-09-17 ENCOUNTER — Encounter: Payer: Self-pay | Admitting: Family Medicine

## 2011-09-17 VITALS — BP 122/76 | HR 70 | Temp 98.7°F | Ht 64.0 in | Wt 177.0 lb

## 2011-09-17 DIAGNOSIS — F339 Major depressive disorder, recurrent, unspecified: Secondary | ICD-10-CM

## 2011-09-17 DIAGNOSIS — I10 Essential (primary) hypertension: Secondary | ICD-10-CM

## 2011-09-17 DIAGNOSIS — E119 Type 2 diabetes mellitus without complications: Secondary | ICD-10-CM

## 2011-09-17 DIAGNOSIS — E785 Hyperlipidemia, unspecified: Secondary | ICD-10-CM

## 2011-09-17 LAB — COMPREHENSIVE METABOLIC PANEL
ALT: 12 U/L (ref 0–35)
AST: 13 U/L (ref 0–37)
Albumin: 4.2 g/dL (ref 3.5–5.2)
Alkaline Phosphatase: 84 U/L (ref 39–117)
BUN: 12 mg/dL (ref 6–23)
CO2: 25 mEq/L (ref 19–32)
Calcium: 9.5 mg/dL (ref 8.4–10.5)
Chloride: 105 mEq/L (ref 96–112)
Creat: 0.88 mg/dL (ref 0.50–1.10)
Glucose, Bld: 108 mg/dL — ABNORMAL HIGH (ref 70–99)
Potassium: 3.9 mEq/L (ref 3.5–5.3)
Sodium: 141 mEq/L (ref 135–145)
Total Bilirubin: 0.3 mg/dL (ref 0.3–1.2)
Total Protein: 6.4 g/dL (ref 6.0–8.3)

## 2011-09-17 LAB — LDL CHOLESTEROL, DIRECT: Direct LDL: 114 mg/dL — ABNORMAL HIGH

## 2011-09-17 MED ORDER — GLUCOSE BLOOD VI STRP: ORAL_STRIP | Status: DC

## 2011-09-17 MED ORDER — DICYCLOMINE HCL 10 MG PO CAPS: ORAL_CAPSULE | ORAL | Status: DC

## 2011-09-17 MED ORDER — SIMVASTATIN 40 MG PO TABS: 40.0000 mg | ORAL_TABLET | Freq: Every day | ORAL | Status: DC

## 2011-09-17 NOTE — Patient Instructions (Signed)
I am trying a medicine called dicyclomine for your nausea. If it doesn't work, let me know. It looks like we tried ondansetron once before. I can't tell from the chart whether that was what didn't work for your not. Ondansetron is a little bit more expensive.  Please set up your mammogram.  I am getting some lab work and will send you a copy of that.  Your looking great! I would recommend seeing you back in about 2 months, sooner if problems

## 2011-09-17 NOTE — Assessment & Plan Note (Addendum)
Currently well-controlled. Her next A1c is due in 2 months. She will followup then. 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 We'll give her dicyclomine for nausea.

## 2011-09-17 NOTE — Progress Notes (Signed)
  Subjective:    Patient ID: [name], female    DOB: [birth year], 66 y.o.   MRN: [id number]  HPI  #1. Diabetes mellitus. Blood sugars are still doing well. She continues to have some intermittent nausea. Unclear if this is related to gastroparesis or 2 other issues. #2. Followup hyperlipidemia. She continues on simvastatin without issue. She does need refills. #3. Depressive disorder. Her clinical social worker has again advised that she be seen by psychiatry. She has scheduled appointment with Dr. Elizbeth Squires had tried psychiatric. #4. Hypertension. Currently well controlled. No episodes of dizziness or lightheadedness. No problems with her medicines.  Review of Systems Pertinent review of systems: negative for fever or unusual weight change.     Objective:   Physical Exam  Vital signs reviewed. GENERAL: Well-developed, well-nourished, no acute distress. CARDIOVASCULAR: Regular rate and rhythm no murmur gallop or rub LUNGS: Clear to auscultation bilaterally, no rales or wheeze. ABDOMEN: Soft positive bowel sounds NEURO: No gross focal neurological deficits. Resting hand tremor. Resting head tremor. MSK: Movement of extremity x 4.        Assessment & Plan:

## 2011-09-17 NOTE — Assessment & Plan Note (Signed)
Refill her simvastatin. Check CMP and direct LDL today she is nonfasting.

## 2011-09-17 NOTE — Assessment & Plan Note (Signed)
Agree with evaluation by psychiatry for complicated depressive symptoms plus minus some psychotic features.

## 2011-09-23 ENCOUNTER — Encounter: Payer: Self-pay | Admitting: Family Medicine

## 2011-10-10 ENCOUNTER — Encounter: Payer: Self-pay | Admitting: Home Health Services

## 2011-11-15 ENCOUNTER — Other Ambulatory Visit: Payer: Self-pay | Admitting: Family Medicine

## 2011-11-30 ENCOUNTER — Other Ambulatory Visit: Payer: Self-pay | Admitting: Family Medicine

## 2011-12-08 ENCOUNTER — Ambulatory Visit (INDEPENDENT_AMBULATORY_CARE_PROVIDER_SITE_OTHER): Payer: Medicare HMO | Admitting: Ophthalmology

## 2011-12-08 DIAGNOSIS — H3530 Unspecified macular degeneration: Secondary | ICD-10-CM

## 2011-12-08 DIAGNOSIS — H43819 Vitreous degeneration, unspecified eye: Secondary | ICD-10-CM

## 2011-12-08 DIAGNOSIS — I10 Essential (primary) hypertension: Secondary | ICD-10-CM

## 2011-12-08 DIAGNOSIS — H35039 Hypertensive retinopathy, unspecified eye: Secondary | ICD-10-CM

## 2011-12-17 ENCOUNTER — Ambulatory Visit
Admission: RE | Admit: 2011-12-17 | Discharge: 2011-12-17 | Disposition: A | Discharge status: Home / Self Care | Payer: Medicare HMO | Source: Ambulatory Visit | Attending: Family Medicine | Admitting: Family Medicine

## 2011-12-17 ENCOUNTER — Ambulatory Visit (INDEPENDENT_AMBULATORY_CARE_PROVIDER_SITE_OTHER): Payer: Medicare HMO | Admitting: Family Medicine

## 2011-12-17 ENCOUNTER — Encounter: Payer: Self-pay | Admitting: Family Medicine

## 2011-12-17 ENCOUNTER — Telehealth: Payer: Self-pay | Admitting: Family Medicine

## 2011-12-17 VITALS — BP 155/106 | HR 85 | Ht 63.25 in | Wt 175.0 lb

## 2011-12-17 DIAGNOSIS — R1310 Dysphagia, unspecified: Secondary | ICD-10-CM

## 2011-12-17 DIAGNOSIS — E119 Type 2 diabetes mellitus without complications: Secondary | ICD-10-CM

## 2011-12-17 DIAGNOSIS — R6889 Other general symptoms and signs: Secondary | ICD-10-CM

## 2011-12-17 DIAGNOSIS — I10 Essential (primary) hypertension: Secondary | ICD-10-CM

## 2011-12-17 DIAGNOSIS — M898X8 Other specified disorders of bone, other site: Secondary | ICD-10-CM

## 2011-12-17 DIAGNOSIS — K222 Esophageal obstruction: Secondary | ICD-10-CM

## 2011-12-17 DIAGNOSIS — G20 Parkinson's disease: Secondary | ICD-10-CM

## 2011-12-17 DIAGNOSIS — G20C Parkinsonism, unspecified: Secondary | ICD-10-CM

## 2011-12-17 DIAGNOSIS — G20A1 Parkinson's disease without dyskinesia, without mention of fluctuations: Secondary | ICD-10-CM

## 2011-12-17 LAB — POCT GLYCOSYLATED HEMOGLOBIN (HGB A1C): Hemoglobin A1C: 5.4

## 2011-12-17 MED ORDER — METFORMIN HCL 850 MG PO TABS: ORAL_TABLET | ORAL | Status: DC

## 2011-12-17 NOTE — Addendum Note (Signed)
Addended byDenny Levy L on: 12/17/2011 03:58 PM   Modules accepted: Orders

## 2011-12-17 NOTE — Telephone Encounter (Signed)
Dear Cliffton Asters Team I ordered a diagnostic swallowing study for her---can u set up \\THANKS ! Denny Levy

## 2011-12-17 NOTE — Telephone Encounter (Signed)
LMOM on both home and cell Swallowing xray is sched for Fri 9/6 at 9:15am. Pt instructed to go to radiology at 9:15am.

## 2011-12-17 NOTE — Progress Notes (Signed)
  Subjective:    Patient ID: [name], female    DOB: 1945-12-25, 66 y.o.   MRN: [id number]  HPI  Follow diabetes mellitus. No episodes of low blood sugar. A1c today is 5.3.  #2. Wants to revisit her swallowing issues. Has had these for years and has some type of surgery done many years ago. In the last 5 months she's had increasing problems swallowing meats and fruits. Has a sticking sensation. No emesis.  #3. Had a fall to 3 days ago and has pain in her left posterior back and hip. She has several bruises. Return 4. Has decided she will go back and see the neurologist one more time regarding her parkinsonism. She's not crazy about him as a person but thinks she needs some change in her medications.  Review of Systems Pertinent review of systems: negative for fever or unusual weight change.     Objective:   Physical Exam  Vital signs reviewed. GENERAL: Well developed, well nourished, no acute distress Back: Nontender to percussion across the thoracic and lumbar vertebra. Tender to palpation over the left iliac crest. A mild bruising on her left posterior hip right thigh right forearm. KNEES: Right knee tender to palpation medial joint line. No effusion. Calf is soft. Full flexion and extension although she has some stiffness it for her in this of extension. NEURO: Resting tremor. NECK:  No thyromegaly: no TM, . Some stiffness in forward flexion essentially full range of motion in flexion extension, lateral rotation.   INJECTION: Patient was given informed consent, signed copy in the chart. Appropriate time out was taken. Area prepped and draped in usual sterile fashion. One for  cc of methylprednisolone 40 mg/ml plus  four cc of 1% lidocaine without epinephrine was injected into the right knee  using a(n) anterior medial  approach. The patient tolerated the procedure well. There were no complications. Post procedure instructions were given.       Assessment & Plan:  1. Bony  pelvic pain s/p fall=- will get x ray pelvis. Advise, heat continue moving--not bed rest---vocodin as needed for pai. I called her---the x ray is negative for fx.

## 2011-12-17 NOTE — Assessment & Plan Note (Signed)
Her up for swallowing study. Given her history of some type of esophageal surgery, she will likely need to be seen by surgery or GI.

## 2011-12-17 NOTE — Assessment & Plan Note (Signed)
Pressure today partly related to the fact that she has not taken her medicines this morning. Also related to fairly high level pain status post fall. We'll recheck see her back. I did urge her to take her medicines even before coming in to clinic

## 2011-12-17 NOTE — Assessment & Plan Note (Signed)
A1C. is 5.3. We'll decrease her metformin from twice a day to daily and recheck A1c in 3 months.

## 2011-12-17 NOTE — Assessment & Plan Note (Signed)
Agree with her intent to follow up with the neurologist. I would really like him following her parkinsonism

## 2011-12-18 ENCOUNTER — Telehealth: Payer: Self-pay | Admitting: Family Medicine

## 2011-12-18 NOTE — Telephone Encounter (Signed)
Left vm for pt to return call, pt is eligible for free 30 min f/u annual wellness visit appt with Rosalita Chessman, letter was mailed to pt.

## 2011-12-19 ENCOUNTER — Ambulatory Visit (HOSPITAL_COMMUNITY)
Admission: RE | Admit: 2011-12-19 | Discharge: 2011-12-19 | Disposition: A | Discharge status: Home / Self Care | Payer: Medicare HMO | Source: Ambulatory Visit | Attending: Family Medicine | Admitting: Family Medicine

## 2011-12-19 DIAGNOSIS — K222 Esophageal obstruction: Secondary | ICD-10-CM

## 2011-12-19 DIAGNOSIS — K224 Dyskinesia of esophagus: Secondary | ICD-10-CM | POA: Insufficient documentation

## 2011-12-19 DIAGNOSIS — R1310 Dysphagia, unspecified: Secondary | ICD-10-CM

## 2011-12-23 ENCOUNTER — Encounter: Payer: Self-pay | Admitting: Family Medicine

## 2011-12-24 ENCOUNTER — Telehealth: Payer: Self-pay | Admitting: Family Medicine

## 2011-12-24 NOTE — Telephone Encounter (Signed)
Pt is asking to speak to nurse about her back - she saw Dr Jennette Kettle last week and she is still in pain, but wants to go to the beach next week.  Wants to know if this is OK

## 2011-12-24 NOTE — Telephone Encounter (Signed)
Spoke with patient and she stated that she had been seen for a fall that she had a week ago. She says now that it is not her back that hurts, it is her hip on the left side. She says that she has been taking the Vicodin and that it has not helped her and she stills is in a lot of pain. Patient wants to know if she can get something stronger prescribed to her because she is going to the beach next week and wants not to be in pain.

## 2011-12-25 NOTE — Telephone Encounter (Signed)
Patient states that she is doing somewhat better. She now wants to know if she can get a shot if she is not getting better by Monday. She will call us and let us know how she is on Monday. I did give her below message

## 2011-12-25 NOTE — Telephone Encounter (Signed)
Dear Cliffton Asters Team I would not want to give her anything stronger. If she is having hat much new and different pain, she should probably be seen THANKS! Denny Levy

## 2012-01-10 ENCOUNTER — Other Ambulatory Visit: Payer: Self-pay | Admitting: Family Medicine

## 2012-01-12 ENCOUNTER — Other Ambulatory Visit: Payer: Self-pay | Admitting: Family Medicine

## 2012-01-12 MED ORDER — PROPRANOLOL HCL ER 80 MG PO CP24: 80.0000 mg | ORAL_CAPSULE | Freq: Every day | ORAL | Status: DC

## 2012-01-13 ENCOUNTER — Emergency Department (HOSPITAL_COMMUNITY): Payer: Medicare HMO

## 2012-01-13 ENCOUNTER — Encounter (HOSPITAL_COMMUNITY): Payer: Self-pay | Admitting: *Deleted

## 2012-01-13 DIAGNOSIS — F411 Generalized anxiety disorder: Secondary | ICD-10-CM | POA: Insufficient documentation

## 2012-01-13 DIAGNOSIS — F3289 Other specified depressive episodes: Secondary | ICD-10-CM | POA: Insufficient documentation

## 2012-01-13 DIAGNOSIS — E119 Type 2 diabetes mellitus without complications: Secondary | ICD-10-CM | POA: Insufficient documentation

## 2012-01-13 DIAGNOSIS — R071 Chest pain on breathing: Secondary | ICD-10-CM | POA: Insufficient documentation

## 2012-01-13 DIAGNOSIS — E785 Hyperlipidemia, unspecified: Secondary | ICD-10-CM | POA: Insufficient documentation

## 2012-01-13 DIAGNOSIS — F329 Major depressive disorder, single episode, unspecified: Secondary | ICD-10-CM | POA: Insufficient documentation

## 2012-01-13 LAB — CBC WITH DIFFERENTIAL/PLATELET
Basophils Absolute: 0 10*3/uL (ref 0.0–0.1)
Basophils Relative: 0 % (ref 0–1)
Eosinophils Absolute: 0.1 10*3/uL (ref 0.0–0.7)
Eosinophils Relative: 1 % (ref 0–5)
HCT: 39.4 % (ref 36.0–46.0)
Hemoglobin: 13.2 g/dL (ref 12.0–15.0)
Lymphocytes Relative: 40 % (ref 12–46)
Lymphs Abs: 2 10*3/uL (ref 0.7–4.0)
MCH: 32 pg (ref 26.0–34.0)
MCHC: 33.5 g/dL (ref 30.0–36.0)
MCV: 95.4 fL (ref 78.0–100.0)
Monocytes Absolute: 0.6 10*3/uL (ref 0.1–1.0)
Monocytes Relative: 11 % (ref 3–12)
Neutro Abs: 2.4 10*3/uL (ref 1.7–7.7)
Neutrophils Relative %: 47 % (ref 43–77)
Platelets: 280 10*3/uL (ref 150–400)
RBC: 4.13 MIL/uL (ref 3.87–5.11)
RDW: 12.4 % (ref 11.5–15.5)
WBC: 5.1 10*3/uL (ref 4.0–10.5)

## 2012-01-13 NOTE — ED Notes (Signed)
Lab reported there was not enough urine to run specimen

## 2012-01-13 NOTE — ED Notes (Signed)
The pt fell Saturday night and since then she has had  Lt lower rib and lt upper abd pain.  She also fell 2 weeks ago.  C/o severe pain in her abd and ribs

## 2012-01-14 ENCOUNTER — Encounter: Payer: Self-pay | Admitting: Family Medicine

## 2012-01-14 ENCOUNTER — Ambulatory Visit (INDEPENDENT_AMBULATORY_CARE_PROVIDER_SITE_OTHER): Payer: Medicare HMO | Admitting: Family Medicine

## 2012-01-14 ENCOUNTER — Emergency Department (HOSPITAL_COMMUNITY)
Admission: EM | Admit: 2012-01-14 | Discharge: 2012-01-14 | Disposition: A | Discharge status: Home / Self Care | Payer: Medicare HMO | Attending: Emergency Medicine | Admitting: Emergency Medicine

## 2012-01-14 ENCOUNTER — Ambulatory Visit: Payer: Medicare HMO

## 2012-01-14 VITALS — BP 140/63 | HR 53 | Temp 97.7°F | Ht 63.25 in | Wt 174.1 lb

## 2012-01-14 DIAGNOSIS — S20219A Contusion of unspecified front wall of thorax, initial encounter: Secondary | ICD-10-CM

## 2012-01-14 DIAGNOSIS — R0789 Other chest pain: Secondary | ICD-10-CM

## 2012-01-14 DIAGNOSIS — Z23 Encounter for immunization: Secondary | ICD-10-CM

## 2012-01-14 LAB — COMPREHENSIVE METABOLIC PANEL
ALT: 10 U/L (ref 0–35)
AST: 15 U/L (ref 0–37)
Albumin: 3.6 g/dL (ref 3.5–5.2)
Alkaline Phosphatase: 106 U/L (ref 39–117)
BUN: 13 mg/dL (ref 6–23)
CO2: 31 mEq/L (ref 19–32)
Calcium: 9.4 mg/dL (ref 8.4–10.5)
Chloride: 106 mEq/L (ref 96–112)
Creatinine, Ser: 0.86 mg/dL (ref 0.50–1.10)
GFR calc Af Amer: 80 mL/min — ABNORMAL LOW (ref >90)
GFR calc non Af Amer: 69 mL/min — ABNORMAL LOW (ref >90)
Glucose, Bld: 134 mg/dL — ABNORMAL HIGH (ref 70–99)
Potassium: 4.5 mEq/L (ref 3.5–5.1)
Sodium: 144 mEq/L (ref 135–145)
Total Bilirubin: 0.2 mg/dL — ABNORMAL LOW (ref 0.3–1.2)
Total Protein: 7.1 g/dL (ref 6.0–8.3)

## 2012-01-14 LAB — URINALYSIS, ROUTINE W REFLEX MICROSCOPIC
Glucose, UA: NEGATIVE mg/dL
Ketones, ur: NEGATIVE mg/dL
Nitrite: NEGATIVE
Protein, ur: NEGATIVE mg/dL
Specific Gravity, Urine: >1.03 — ABNORMAL HIGH (ref 1.005–1.030)
Urobilinogen, UA: 1 mg/dL (ref 0.0–1.0)
pH: 5.5 (ref 5.0–8.0)

## 2012-01-14 LAB — URINE MICROSCOPIC-ADD ON

## 2012-01-14 LAB — LIPASE, BLOOD: Lipase: 36 U/L (ref 11–59)

## 2012-01-14 MED ORDER — OMEPRAZOLE 40 MG PO CPDR: 40.0000 mg | DELAYED_RELEASE_CAPSULE | Freq: Every day | ORAL | Status: DC

## 2012-01-14 MED ORDER — MORPHINE SULFATE 4 MG/ML IJ SOLN
6.0000 mg | Freq: Once | INTRAMUSCULAR | Status: AC
  Administered 2012-01-14: 6 mg via INTRAMUSCULAR
  Filled 2012-01-14: qty 2

## 2012-01-14 MED ORDER — IBUPROFEN 400 MG PO TABS
600.0000 mg | ORAL_TABLET | Freq: Once | ORAL | Status: AC
  Administered 2012-01-14: 600 mg via ORAL
  Filled 2012-01-14: qty 3

## 2012-01-14 NOTE — ED Provider Notes (Signed)
History     CSN: [id number]  Arrival date & time 01/13/12  2313   First MD Initiated Contact with Patient 01/14/12 [phone]      Chief Complaint  Patient presents with  . Fall     Patient is a 66 y.o. female presenting with fall. The history is provided by the patient.  Fall The accident occurred more than 2 days ago. The fall occurred while recreating/playing. Point of impact: left chest. Pain location: left chest. The pain is severe. She was ambulatory at the scene. Pertinent negatives include no fever, no abdominal pain, no vomiting and no loss of consciousness. The symptoms are aggravated by activity (palpation). She has tried rest for the symptoms. The treatment provided no relief.  Pt reports she was "playing" with a family member and fell to ground Reports she injured her chest wall No head injury No neck injury No neck or back pain No sob No new vomiting or abdominal pain No weakness She reports pain is worsening  She takes vicodin for chronic back pain.  She takes 2 in the morning and 2 in the evening This has not helped pain No cough/fever reported No dizziness reported  Past Medical History  Diagnosis Date  . Allergy   . Anxiety   . Arthritis   . Depression   . Diabetes mellitus   . Hyperlipidemia   . HIV infection     Past Surgical History  Procedure Date  . Total knee arthroplasty     right  . Esophagogastric fundoplasty     some type "esoph surgery" per pt  . Colon surgery   . Abdominal hysterectomy     Family History  Problem Relation Age of Onset  . Cerebral palsy Son   . Heart disease Mother   . Diabetes Mother   . Heart disease Father     History  Substance Use Topics  . Smoking status: Never Smoker   . Smokeless tobacco: Never Used  . Alcohol Use: No    OB History    Grav Para Term Preterm Abortions TAB SAB Ect Mult Living                  Review of Systems  Constitutional: Negative for fever.  Gastrointestinal: Negative for  vomiting and abdominal pain.  Neurological: Negative for loss of consciousness.  All other systems reviewed and are negative.    Allergies  Lisinopril and Azilect  Home Medications   Current Outpatient Rx  Name Route Sig Dispense Refill  . CARBIDOPA-LEVODOPA 25-100 MG PO TABS Oral Take 1 tablet by mouth 3 (three) times daily.    Marland Kitchen DICYCLOMINE HCL 10 MG PO CAPS  Take one tab three times a day as needed for nausea 90 capsule 3  . FLUOXETINE HCL 20 MG PO CAPS Oral Take 20 mg by mouth daily.     Marland Kitchen HYDROCODONE-ACETAMINOPHEN 5-500 MG PO TABS Oral Take 1-2 tablets by mouth 2 (two) times daily as needed. For pain    . METFORMIN HCL 850 MG PO TABS Oral Take 850 mg by mouth 2 (two) times daily with a meal.    . TEMAZEPAM 15 MG PO CAPS Oral Take 15 mg by mouth at bedtime as needed. For sleep    . GLUCOSE BLOOD VI STRP  Use as instructed 100 each 12  . ONETOUCH DELICA LANCETS MISC  AS DIRECTED FOR TESTING EVERY DAY 100 each 12    BP 154/74  Pulse 54  Temp 98.3  F (36.8 C) (Oral)  Resp 16  SpO2 96%  Physical Exam CONSTITUTIONAL: Well developed/well nourished HEAD AND FACE: Normocephalic/atraumatic EYES: EOMI/PERRL ENMT: Mucous membranes moist NECK: supple no meningeal signs SPINE:entire spine nontender, No bruising/crepitance/stepoffs noted to spine CV: S1/S2 noted, no murmurs/rubs/gallops noted LUNGS: Lungs are clear to auscultation bilaterally, no apparent distress Chest - left lower costal margin tender, but no bruising/crepitance noted - family present at patient request ABDOMEN: soft, nontender, no rebound or guarding, no bruising noted GU:no cva tenderness NEURO: Pt is awake/alert, moves all extremitiesx4 EXTREMITIES: pulses normal, full ROM, no tenderness, no deformity SKIN: warm, color normal PSYCH: no abnormalities of mood noted  ED Course  Procedures   Labs Reviewed  COMPREHENSIVE METABOLIC PANEL - Abnormal; Notable for the following:    Glucose, Bld 134 (*)      Total Bilirubin 0.2 (*)     GFR calc non Af Amer 69 (*)     GFR calc Af Amer 80 (*)     All other components within normal limits  URINALYSIS, ROUTINE W REFLEX MICROSCOPIC - Abnormal; Notable for the following:    Specific Gravity, Urine >1.030 (*)     Hgb urine dipstick TRACE (*)     Bilirubin Urine SMALL (*)     Leukocytes, UA TRACE (*)     All other components within normal limits  CBC WITH DIFFERENTIAL  LIPASE, BLOOD  URINE MICROSCOPIC-ADD ON  URINALYSIS, ROUTINE W REFLEX MICROSCOPIC   Dg Ribs Unilateral W/chest Left  01/13/2012  *RADIOLOGY REPORT*  Clinical Data: Fall.  Left-sided rib pain  LEFT RIBS AND CHEST - 3+ VIEW  Comparison: Chest x-ray from 11 of the 10  Findings: The lungs are clear without focal consolidation, edema, effusion or pneumothorax.  Cardiopericardial silhouette is within normal limits for size.  Imaged bony structures of the thorax are intact.  Oblique views of the left ribs show no evidence for rib fracture.  IMPRESSION: No acute cardiopulmonary process.  No evidence for left-sided rib fracture.   Original Report Authenticated By: ERIC A. MANSELL, M.D.      1. Chest wall pain    Pt with chest wall pain from fall but no obvious rib fx by xray Advised increasing pain meds (max at 6 over 24 hours, pt agreeable) And incentive spirometer No other signs of traumatic injury  Of note, it mentions HIV for her in PMH but I see no mention of this in PCP notes MDM  Nursing notes including past medical history and social history reviewed and considered in documentation xrays reviewed and considered Labs/vital reviewed and considered         Joya Gaskins, MD 01/14/12 [phone]

## 2012-01-16 NOTE — Progress Notes (Signed)
  Subjective:    Patient ID: [name], female    DOB: 02-06-1946, 66 y.o.   MRN: [id number]  HPI Followup fall she had at home. Was seen in the emergency department and diagnosed with probable rib fracture. They gave her some pain medicine. She continues to have pain and is concerned. No shortness of breath but pain with deep inspiration, pain with certain movements.   Review of Systems Denies   fever, hemoptysis. Objective:   Physical Exam Vital signs are reviewed GENERAL: Well-developed female in acute distress LUNGS clear to auscultation bilaterally RIBS tender to palpation left lower rib area there is no defect.       Assessment & Plan:  #1. Rib contusion or rib fracture. Discussed options with her which are all conservative in nature. She has some pain medicine at home that she can use. I would not increase that dose. #2. Gave her her flu shot today

## 2012-02-09 ENCOUNTER — Other Ambulatory Visit: Payer: Self-pay | Admitting: Family Medicine

## 2012-02-18 ENCOUNTER — Other Ambulatory Visit: Payer: Self-pay | Admitting: Family Medicine

## 2012-02-25 ENCOUNTER — Other Ambulatory Visit: Payer: Self-pay | Admitting: Family Medicine

## 2012-03-11 ENCOUNTER — Emergency Department (HOSPITAL_COMMUNITY)
Admission: EM | Admit: 2012-03-11 | Discharge: 2012-03-12 | Disposition: A | Discharge status: Home / Self Care | Payer: Medicare HMO | Attending: Emergency Medicine | Admitting: Emergency Medicine

## 2012-03-11 ENCOUNTER — Encounter (HOSPITAL_COMMUNITY): Payer: Self-pay | Admitting: Adult Health

## 2012-03-11 ENCOUNTER — Emergency Department (HOSPITAL_COMMUNITY): Payer: Medicare HMO

## 2012-03-11 DIAGNOSIS — Y939 Activity, unspecified: Secondary | ICD-10-CM | POA: Insufficient documentation

## 2012-03-11 DIAGNOSIS — Z8739 Personal history of other diseases of the musculoskeletal system and connective tissue: Secondary | ICD-10-CM | POA: Insufficient documentation

## 2012-03-11 DIAGNOSIS — S42213A Unspecified displaced fracture of surgical neck of unspecified humerus, initial encounter for closed fracture: Secondary | ICD-10-CM | POA: Insufficient documentation

## 2012-03-11 DIAGNOSIS — W010XXA Fall on same level from slipping, tripping and stumbling without subsequent striking against object, initial encounter: Secondary | ICD-10-CM | POA: Insufficient documentation

## 2012-03-11 DIAGNOSIS — E1169 Type 2 diabetes mellitus with other specified complication: Secondary | ICD-10-CM | POA: Insufficient documentation

## 2012-03-11 DIAGNOSIS — S42309A Unspecified fracture of shaft of humerus, unspecified arm, initial encounter for closed fracture: Secondary | ICD-10-CM

## 2012-03-11 DIAGNOSIS — Z8659 Personal history of other mental and behavioral disorders: Secondary | ICD-10-CM | POA: Insufficient documentation

## 2012-03-11 DIAGNOSIS — IMO0001 Reserved for inherently not codable concepts without codable children: Secondary | ICD-10-CM | POA: Insufficient documentation

## 2012-03-11 DIAGNOSIS — Y929 Unspecified place or not applicable: Secondary | ICD-10-CM | POA: Insufficient documentation

## 2012-03-11 DIAGNOSIS — Z79899 Other long term (current) drug therapy: Secondary | ICD-10-CM | POA: Insufficient documentation

## 2012-03-11 DIAGNOSIS — E785 Hyperlipidemia, unspecified: Secondary | ICD-10-CM | POA: Insufficient documentation

## 2012-03-11 DIAGNOSIS — M25519 Pain in unspecified shoulder: Secondary | ICD-10-CM | POA: Insufficient documentation

## 2012-03-11 NOTE — ED Provider Notes (Signed)
History     CSN: [id number]  Arrival date & time 03/11/12  2329   First MD Initiated Contact with Patient 03/11/12 2330      Chief Complaint  Patient presents with  . Fall    (Consider location/radiation/quality/duration/timing/severity/associated sxs/prior treatment) HPI Pt states she was in her normal state of health and slipped on water on the floor and landing on her left shoulder. No head or neck trauma. No LOC. No focal weakness. No cough, fever, lower ext swelling. Given 150 ug of fentanyl by EMS.  Past Medical History  Diagnosis Date  . Allergy   . Anxiety   . Arthritis   . Depression   . Diabetes mellitus   . Hyperlipidemia   . HIV infection     pt denies this diagnosis.     Past Surgical History  Procedure Date  . Total knee arthroplasty     right  . Esophagogastric fundoplasty     some type "esoph surgery" per pt  . Colon surgery   . Abdominal hysterectomy     Family History  Problem Relation Age of Onset  . Cerebral palsy Son   . Heart disease Mother   . Diabetes Mother   . Heart disease Father     History  Substance Use Topics  . Smoking status: Never Smoker   . Smokeless tobacco: Never Used  . Alcohol Use: No    OB History    Grav Para Term Preterm Abortions TAB SAB Ect Mult Living                  Review of Systems  Constitutional: Negative for fever, chills and fatigue.  HENT: Negative for neck pain and neck stiffness.   Eyes: Negative for visual disturbance.  Respiratory: Negative for cough, chest tightness, shortness of breath and wheezing.   Cardiovascular: Negative for chest pain, palpitations and leg swelling.  Gastrointestinal: Negative for nausea, vomiting and abdominal pain.  Genitourinary: Negative for dysuria and frequency.  Musculoskeletal: Positive for myalgias and arthralgias. Negative for back pain.  Skin: Negative for rash and wound.  Neurological: Negative for dizziness, syncope, weakness, light-headedness,  numbness and headaches.  Psychiatric/Behavioral: Negative for dysphoric mood.    Allergies  Ace inhibitors; Lisinopril; and Azilect  Home Medications   Current Outpatient Rx  Name  Route  Sig  Dispense  Refill  . CARBIDOPA-LEVODOPA 25-100 MG PO TABS   Oral   Take 1 tablet by mouth 3 (three) times daily.         Marland Kitchen FLUOXETINE HCL 20 MG PO CAPS   Oral   Take 20 mg by mouth daily.          Marland Kitchen HYDROCODONE-ACETAMINOPHEN 5-500 MG PO TABS   Oral   Take 1-2 tablets by mouth 2 (two) times daily as needed. For pain         . METFORMIN HCL 850 MG PO TABS   Oral   Take 850 mg by mouth daily.          Marland Kitchen OMEPRAZOLE 40 MG PO CPDR   Oral   Take 40 mg by mouth daily.         Marland Kitchen PROPRANOLOL HCL ER 80 MG PO CP24   Oral   Take 80 mg by mouth daily.         Marland Kitchen TEMAZEPAM 15 MG PO CAPS   Oral   Take 15 mg by mouth at bedtime as needed. For sleep         .  OXYCODONE-ACETAMINOPHEN 5-325 MG PO TABS   Oral   Take 1 tablet by mouth every 4 (four) hours as needed for pain.   15 tablet   0     BP 143/66  Pulse 64  Temp 98 F (36.7 C) (Oral)  Resp 18  SpO2 95%  Physical Exam  Nursing note and vitals reviewed. Constitutional: She is oriented to person, place, and time. She appears well-developed and well-nourished. No distress.  HENT:  Head: Normocephalic and atraumatic.  Mouth/Throat: Oropharynx is clear and moist.  Eyes: EOM are normal. Pupils are equal, round, and reactive to light.  Neck: Normal range of motion. Neck supple.       No posterior cervical midline TTP  Cardiovascular: Normal rate and regular rhythm.   Pulmonary/Chest: Effort normal and breath sounds normal. No respiratory distress. She has no wheezes. She has no rales. She exhibits no tenderness.  Abdominal: Soft. Bowel sounds are normal. There is no tenderness. There is no rebound and no guarding.  Musculoskeletal: Normal range of motion. She exhibits tenderness (TTP L posterior shoulder, superior border  of scapula. No L elbow/wrist hand injury or pain. 2+ radial pulses. ). She exhibits no edema.  Neurological: She is alert and oriented to person, place, and time.       5/5 motor in all ext. Sensation intact  Skin: Skin is warm and dry. No rash noted. No erythema.  Psychiatric: She has a normal mood and affect. Her behavior is normal.    ED Course  Procedures (including critical care time)  Labs Reviewed - No data to display Dg Chest [facility] 1 View  03/12/2012  *RADIOLOGY REPORT*  Clinical Data: History of trauma complaining of shoulder pain.  PORTABLE CHEST - 1 VIEW  Comparison: Chest x-ray 01/13/2012.  Findings: Lung volumes are low.  No consolidative airspace disease. No pleural effusions.  No pneumothorax.  Pulmonary vasculature is normal.  Heart size appears borderline to mildly enlarged, but is likely accentuated by gross patient rotation to the right which grossly distorts the mediastinal contours.  Acute fracture of the left humeral neck is incidentally noted.  IMPRESSION: 1.  No radiographic evidence of acute cardiopulmonary disease. 2.  Left humeral neck fracture.   Original Report Authenticated By: Trudie Reed, M.D.    Dg Shoulder Left  03/12/2012  *RADIOLOGY REPORT*  Clinical Data: History of fall complaining of left shoulder pain.  LEFT SHOULDER - 2+ VIEW  Comparison: No priors.  Findings: There is a mildly comminuted fracture of the left humeral neck (surgical neck) which has a separate fracture fragment from the greater tuberosity.  Humeral head appears properly located.  IMPRESSION: 1.  Mildly comminuted surgical neck fracture of the left proximal humerus with a separate fragment from the greater tuberosity.   Original Report Authenticated By: Trudie Reed, M.D.      1. Humerus fracture       MDM   Low normal O2 sat in ED. No cough fever, CP. Mild SOB. Suspect sats due to fentanyl. Will shoot CXR and monitor.   O2 95-96 on RA. Humerus splinted and sling placed. F/u  with orthopedics.      Loren Racer, MD 03/12/12 [phone]

## 2012-03-11 NOTE — ED Notes (Signed)
Presents with fall while at home helping dsabled son out of bath tub.  Left shoulder and arm pain, left shoulder tender to touch. CMS intact. Shoulder immobilized. Denies LOC. O2 sats 90s. Pt c/o SOB.

## 2012-03-12 MED ORDER — HYDROCODONE-ACETAMINOPHEN 5-325 MG PO TABS
1.0000 | ORAL_TABLET | Freq: Once | ORAL | Status: DC
  Filled 2012-03-12: qty 1

## 2012-03-12 MED ORDER — OXYCODONE-ACETAMINOPHEN 5-325 MG PO TABS: 1.0000 | ORAL_TABLET | ORAL | Status: DC | PRN

## 2012-03-12 MED ORDER — OXYCODONE-ACETAMINOPHEN 5-325 MG PO TABS
1.0000 | ORAL_TABLET | Freq: Once | ORAL | Status: AC
  Administered 2012-03-12: 1 via ORAL
  Filled 2012-03-12: qty 1

## 2012-03-12 MED ORDER — OXYCODONE-ACETAMINOPHEN 5-325 MG PO TABS: 2.0000 | ORAL_TABLET | Freq: Once | ORAL | Status: DC

## 2012-03-12 NOTE — Progress Notes (Signed)
Orthopedic Tech Progress Note Patient Details:  [name] 1945/06/18 [id number]  Ortho Devices Type of Ortho Device: Sugartong splint;Arm foam sling   Haskell Flirt 03/12/2012, 1:29 AM

## 2012-04-21 ENCOUNTER — Encounter: Payer: Self-pay | Admitting: Family Medicine

## 2012-04-21 NOTE — Telephone Encounter (Signed)
Error

## 2012-04-26 NOTE — Telephone Encounter (Signed)
This encounter was created in error - please disregard.

## 2012-06-09 ENCOUNTER — Encounter: Payer: Self-pay | Admitting: Family Medicine

## 2012-06-09 ENCOUNTER — Ambulatory Visit (INDEPENDENT_AMBULATORY_CARE_PROVIDER_SITE_OTHER): Payer: Medicare HMO | Admitting: Family Medicine

## 2012-06-09 VITALS — BP 131/70 | HR 83 | Temp 98.2°F | Ht 63.75 in | Wt 181.6 lb

## 2012-06-09 DIAGNOSIS — Z96651 Presence of right artificial knee joint: Secondary | ICD-10-CM

## 2012-06-09 DIAGNOSIS — E119 Type 2 diabetes mellitus without complications: Secondary | ICD-10-CM

## 2012-06-09 DIAGNOSIS — IMO0002 Reserved for concepts with insufficient information to code with codable children: Secondary | ICD-10-CM

## 2012-06-09 DIAGNOSIS — E538 Deficiency of other specified B group vitamins: Secondary | ICD-10-CM

## 2012-06-09 DIAGNOSIS — Z96659 Presence of unspecified artificial knee joint: Secondary | ICD-10-CM

## 2012-06-09 DIAGNOSIS — I10 Essential (primary) hypertension: Secondary | ICD-10-CM

## 2012-06-09 HISTORY — DX: Presence of unspecified artificial knee joint: Z96.659

## 2012-06-09 MED ORDER — CYANOCOBALAMIN 1000 MCG/ML IJ SOLN
1000.0000 ug | Freq: Once | INTRAMUSCULAR | Status: AC
  Administered 2012-06-09: 1000 ug via INTRAMUSCULAR

## 2012-06-09 NOTE — Assessment & Plan Note (Signed)
No episodes of low blood sugar. Seems to be doing pretty well. We'll get some lab work and see her back in 3 months

## 2012-06-09 NOTE — Addendum Note (Signed)
Addended by: Tanna Savoy on: 06/09/2012 05:23 PM   Modules accepted: Orders

## 2012-06-09 NOTE — Assessment & Plan Note (Signed)
Continued left knee pain status post right TKR and has not yet decided if she wants to do the left knee or when she wants to do it. Today she would like a shot we did that.

## 2012-06-09 NOTE — Progress Notes (Signed)
  Subjective:    Patient ID: [name], female    DOB: Oct 01, 1945, 67 y.o.   MRN: [id number]  HPI  #1. Left knee pain. She would like a corticosteroid injection today. They have helped in the past. She still undecided what about getting her left knee replaced. The right knee has done well but she's not sure this is the time to do it. #2. Hypertension. Taking her medicines regularly without problem. Denies chest pain, no shortness of breath with exertion, no lower extremity edema. #3. Diabetes mellitus. Not checking her sugars really regularly but no episodes of low blood sugar. No problems with her medicines.  Review of Systems See history of present illness    Objective:   Physical Exam  Vital signs are reviewed and noted his blood pressure is well controlled GENERAL: Well-developed female no acute distress KNEE: Left. Synovitis is apparent with external changes of osteoarthritis. No effusion. Full extension and flexion. Tender to palpation medial joint line. Distally she is neurovascularly intact in the calf is soft. INJECTION: Patient was given informed consent, signed copy in the chart. Appropriate time out was taken. Area prepped and draped in usual sterile fashion. One cc of methylprednisolone 40 mg/ml plus  4 cc of 1% lidocaine without epinephrine was injected into the left knee using a(n) anterior medial approach. The patient tolerated the procedure well. There were no complications. Post procedure instructions were given.       Assessment & Plan:

## 2012-06-09 NOTE — Assessment & Plan Note (Signed)
Blood pressure seems fairly well-controlled today. We'll make no changes in her medication regimen. I'll see her back in 3 months.

## 2012-06-14 ENCOUNTER — Ambulatory Visit (INDEPENDENT_AMBULATORY_CARE_PROVIDER_SITE_OTHER): Payer: Medicare HMO | Admitting: Ophthalmology

## 2012-06-14 DIAGNOSIS — H26499 Other secondary cataract, unspecified eye: Secondary | ICD-10-CM

## 2012-06-14 DIAGNOSIS — H43819 Vitreous degeneration, unspecified eye: Secondary | ICD-10-CM

## 2012-06-14 DIAGNOSIS — H35039 Hypertensive retinopathy, unspecified eye: Secondary | ICD-10-CM

## 2012-06-14 DIAGNOSIS — I10 Essential (primary) hypertension: Secondary | ICD-10-CM

## 2012-06-14 DIAGNOSIS — E11319 Type 2 diabetes mellitus with unspecified diabetic retinopathy without macular edema: Secondary | ICD-10-CM

## 2012-06-14 DIAGNOSIS — H3530 Unspecified macular degeneration: Secondary | ICD-10-CM

## 2012-06-15 ENCOUNTER — Encounter: Payer: Self-pay | Admitting: Family Medicine

## 2012-06-15 NOTE — Progress Notes (Unsigned)
Patient ID: [name], female   DOB: 06/20/1945, 67 y.o.   MRN: [id number]

## 2012-06-21 ENCOUNTER — Ambulatory Visit (INDEPENDENT_AMBULATORY_CARE_PROVIDER_SITE_OTHER): Payer: Medicare HMO | Admitting: Ophthalmology

## 2012-06-28 ENCOUNTER — Ambulatory Visit (INDEPENDENT_AMBULATORY_CARE_PROVIDER_SITE_OTHER): Payer: Medicare HMO | Admitting: Ophthalmology

## 2012-08-10 ENCOUNTER — Other Ambulatory Visit: Payer: Self-pay | Admitting: Family Medicine

## 2012-08-11 NOTE — Telephone Encounter (Signed)
I have called in rx to patient's pharmacy

## 2012-08-11 NOTE — Telephone Encounter (Signed)
Dear [facility] Can u call this in? THANKS! [name]  

## 2012-08-12 ENCOUNTER — Other Ambulatory Visit: Payer: Self-pay | Admitting: Family Medicine

## 2012-09-07 ENCOUNTER — Other Ambulatory Visit: Payer: Self-pay | Admitting: Family Medicine

## 2012-09-22 ENCOUNTER — Ambulatory Visit: Payer: Medicare HMO | Admitting: Family Medicine

## 2012-10-06 ENCOUNTER — Ambulatory Visit (INDEPENDENT_AMBULATORY_CARE_PROVIDER_SITE_OTHER): Payer: Medicare HMO | Admitting: Family Medicine

## 2012-10-06 ENCOUNTER — Encounter: Payer: Self-pay | Admitting: Family Medicine

## 2012-10-06 VITALS — BP 148/87 | HR 67 | Temp 99.2°F | Ht 63.5 in | Wt 185.2 lb

## 2012-10-06 DIAGNOSIS — R5383 Other fatigue: Secondary | ICD-10-CM

## 2012-10-06 DIAGNOSIS — E785 Hyperlipidemia, unspecified: Secondary | ICD-10-CM

## 2012-10-06 DIAGNOSIS — G20 Parkinson's disease: Secondary | ICD-10-CM

## 2012-10-06 DIAGNOSIS — R5381 Other malaise: Secondary | ICD-10-CM

## 2012-10-06 DIAGNOSIS — E119 Type 2 diabetes mellitus without complications: Secondary | ICD-10-CM

## 2012-10-06 DIAGNOSIS — M1710 Unilateral primary osteoarthritis, unspecified knee: Secondary | ICD-10-CM

## 2012-10-06 DIAGNOSIS — I10 Essential (primary) hypertension: Secondary | ICD-10-CM

## 2012-10-06 DIAGNOSIS — IMO0002 Reserved for concepts with insufficient information to code with codable children: Secondary | ICD-10-CM

## 2012-10-06 LAB — POCT GLYCOSYLATED HEMOGLOBIN (HGB A1C): Hemoglobin A1C: 5.9

## 2012-10-06 LAB — COMPREHENSIVE METABOLIC PANEL
ALT: <8 U/L (ref 0–35)
AST: 20 U/L (ref 0–37)
Albumin: 4.1 g/dL (ref 3.5–5.2)
Alkaline Phosphatase: 87 U/L (ref 39–117)
BUN: 14 mg/dL (ref 6–23)
CO2: 23 mEq/L (ref 19–32)
Calcium: 9.6 mg/dL (ref 8.4–10.5)
Chloride: 104 mEq/L (ref 96–112)
Creat: 1.03 mg/dL (ref 0.50–1.10)
Glucose, Bld: 102 mg/dL — ABNORMAL HIGH (ref 70–99)
Potassium: 4.3 mEq/L (ref 3.5–5.3)
Sodium: 141 mEq/L (ref 135–145)
Total Bilirubin: 0.5 mg/dL (ref 0.3–1.2)
Total Protein: 7.3 g/dL (ref 6.0–8.3)

## 2012-10-06 LAB — LDL CHOLESTEROL, DIRECT: Direct LDL: 151 mg/dL — ABNORMAL HIGH

## 2012-10-06 MED ORDER — CYANOCOBALAMIN 1000 MCG/ML IJ SOLN
1000.0000 ug | Freq: Once | INTRAMUSCULAR | Status: AC
  Administered 2012-10-06: 1000 ug via INTRAMUSCULAR

## 2012-10-06 MED ORDER — OMEPRAZOLE 40 MG PO CPDR
40.0000 mg | DELAYED_RELEASE_CAPSULE | Freq: Every day | ORAL | Status: DC
Start: 2012-10-06 — End: 2013-01-18

## 2012-10-06 MED ORDER — SIMVASTATIN 40 MG PO TABS: 40.0000 mg | ORAL_TABLET | Freq: Every day | ORAL | Status: DC

## 2012-10-06 MED ORDER — HYDROCODONE-ACETAMINOPHEN 5-325 MG PO TABS: ORAL_TABLET | ORAL | Status: DC

## 2012-10-06 MED ORDER — PROPRANOLOL HCL ER 80 MG PO CP24: 80.0000 mg | ORAL_CAPSULE | Freq: Every day | ORAL | Status: DC

## 2012-10-06 NOTE — Progress Notes (Signed)
  Subjective:    Patient ID: [name], female    DOB: 04-04-1946, 67 y.o.   MRN: [id number]  HPI #1. Left knee pain. She would like to injection. She plans to get knee replacement in the next 6 months but is waiting for some family issues to resolve. The right knee where she had previous knee replacement is doing extremely well. #2. Parkinsonian syndrome seems to be pretty well under control although she's having occasional episodes of stiffness in her lower extremities. #3. Diabetes mellitus. Since her sugars have been elevated in the last 2 months. She wonders if she could increase her metformin. #4. Having intermittent nausea particular if she takes her metformin at the same time as she takes her medicine for her Parkinson's. No emesis. No weight loss.   Review of Systems Denies fever, sweats, chills.    Objective:   Physical Exam  Vital signs reviewed GENERAL: Well-developed female no acute distress in LUNGS: Clear to auscultation bilaterally CV: Regular rate and rhythm no murmur NEURO: Mild stiffness and some cogwheel rigidity bilateral upper extremities. EXTREMITY: Right knee full range of motion flexion extension. Left knee tender to palpation medial and lateral joint line. There is some mild synovitis type swelling but no erythema or effusion. ABDOMEN: Soft, positive bowel sounds, no rebound or guarding.    INJECTION: Patient was given informed consent, signed copy in the chart. Appropriate time out was taken. Area prepped and draped in usual sterile fashion. 1 cc of methylprednisolone 40 mg/ml plus  4 cc of 1% lidocaine without epinephrine was injected into the left knee  using a(n) anterior approach. The patient tolerated the procedure well. There were no complications. Post procedure instructions were given.     Assessment & Plan:

## 2012-10-06 NOTE — Assessment & Plan Note (Signed)
See neurology for her parkinsonism. Suggested separating her Parkinson's medicines and her metformin. We'll also give her some Zofran.

## 2012-10-06 NOTE — Assessment & Plan Note (Signed)
Check A1c. I am not excited about increasing her metformin given her age and creatinine clearance. We did discuss some dietary modification.

## 2012-10-06 NOTE — Assessment & Plan Note (Signed)
Today we injected her left knee. I do think she would benefit from knee replacement bear but understand her time constraints regarding her family issues. She would also like to get a little bit stronger before the next surgery. The arm fracture really put her back as far as her overall ability in strength and she would like to approach this knee replacement with little bit better quadricep strength.

## 2012-10-11 ENCOUNTER — Encounter: Payer: Self-pay | Admitting: Family Medicine

## 2012-10-21 ENCOUNTER — Other Ambulatory Visit: Payer: Self-pay

## 2012-12-22 ENCOUNTER — Ambulatory Visit: Payer: Medicare HMO | Admitting: Family Medicine

## 2012-12-23 ENCOUNTER — Telehealth: Payer: Self-pay | Admitting: Family Medicine

## 2012-12-23 NOTE — Telephone Encounter (Signed)
Pt called because the [facility] nurse said that she had a high level of something in her urine and that could lead to a liver problems and she would like [name] to call her. JW

## 2012-12-27 ENCOUNTER — Encounter: Payer: Self-pay | Admitting: Diagnostic Neuroimaging

## 2013-01-06 ENCOUNTER — Ambulatory Visit: Payer: Medicare HMO

## 2013-01-15 ENCOUNTER — Other Ambulatory Visit: Payer: Self-pay | Admitting: Family Medicine

## 2013-01-18 ENCOUNTER — Ambulatory Visit (INDEPENDENT_AMBULATORY_CARE_PROVIDER_SITE_OTHER): Payer: 59 | Admitting: Diagnostic Neuroimaging

## 2013-01-18 ENCOUNTER — Encounter: Payer: Self-pay | Admitting: Diagnostic Neuroimaging

## 2013-01-18 VITALS — BP 142/82 | HR 83 | Temp 98.7°F | Ht 63.0 in | Wt 183.5 lb

## 2013-01-18 DIAGNOSIS — F0280 Dementia in other diseases classified elsewhere without behavioral disturbance: Secondary | ICD-10-CM | POA: Insufficient documentation

## 2013-01-18 DIAGNOSIS — G20 Parkinson's disease: Secondary | ICD-10-CM

## 2013-01-18 NOTE — Progress Notes (Signed)
[facility]  PATIENT: [name] DOB: [birth year]  REFERRING CLINICIAN:  HISTORY FROM: patient REASON FOR VISIT: follow up   HISTORICAL  CHIEF COMPLAINT:  Chief Complaint  Patient presents with  . Follow-up    PD    HISTORY OF PRESENT ILLNESS:   UPDATE 01/18/13: Since last visit, now on carb/levo 1.5 tabs TID. Nausea is improved since she started taking her metformin at different time than carb/levo. Tremor, balance, coordination are stable.  UPDATE 06/01/12: Since last visit, tried carb/levo 1.5 tabs TID x 1 week, then stopped. Felt like it was too much medicine. Did not have increased side effects. Has been struggling with nausea throughout. Tremor is persistent.  UPDATE 01/28/12: Doing well. No wearing off. Tolerating meds. Stopped azilect (per PCP for nausea). Now on fluoxetine for depression.  UPDATE 08/27/10: Doing better on carb/levo.  Taking 1 tab TID (6am, 4pm, 9pm).  Minimal nausea.  Wakes up with more tremor in AM, then reduction in tremor after dose.  Effect wears off around 4pm.  Daughter reports some intermittent confusion.  PRIOR HPI (06/26/10): 67 yo Caucasian female referred to [place] for tremor with concern for possible Parkinson's disease. [name] notes she first noticed a resting tremor in her left hand about 3 years ago, and this has gotten progressively worse since then. It has also spread to now involve her mouth and right hand as well, though she notes it is still worst in her left hand. Stress and anxiety can accentuate the tremors, whereas active use can reduce them. She also notes the tremors being worse in the morning. She denies noting anything else that seems to make the tremors better or worse. She admits to what seems like possible bradykinesia, in her words that she has "a hard time getting going sometimes," but denies any freezing. She does note some new difficulty with writing, but denies micrographia. She also admits to  feeling like her balance and walking is "a bit off," but she relates this more to the osteoarthritis in her knees and having had surgery on her R knee. She denies orthostatic symptoms, hallucinations or delusions, difficulty standing or sitting, weakness, new visual changes (aside from her macular degeneration), or feelings of rigidity. She also denies bizarre dreams, nightmares, RLS symptoms, or REM behavior disorder symptoms. She admits to having two uncles who have tremors, etiology unclear, but adds that one uncle had alcoholism (and it was believed this was the cause). She is concerned about what is causing her tremors, and admits that although she initially "put off" being evaluated further, she is anxious to know what might be the cause of her tremors.  REVIEW OF SYSTEMS: Full 14 system review of systems performed and notable only for fevers chills ringing in ears trouble swallowing incontinence diarrhea feeling hot and cold easy bruising.  ALLERGIES: Allergies  Allergen Reactions  . Ace Inhibitors Anaphylaxis and Swelling  . Lisinopril     Severe facial angioedema requiring hospitalization 2007 (approx)  . Azilect [Rasagiline Mesylate]     hypotension    HOME MEDICATIONS: Outpatient Prescriptions Prior to Visit  Medication Sig Dispense Refill  . carbidopa-levodopa (SINEMET IR) 25-100 MG per tablet Take 1 tablet by mouth 3 (three) times daily.      Marland Kitchen FLUoxetine (PROZAC) 20 MG capsule Take 20 mg by mouth daily.       Marland Kitchen HYDROcodone-acetaminophen (NORCO/VICODIN) 5-325 MG per tablet Take 1-2 tabs by mouth twice a day for knee pain  120  tablet  5  . metFORMIN (GLUCOPHAGE) 850 MG tablet Take 850 mg by mouth daily.       . ONE TOUCH ULTRA TEST test strip USE AS INSTRUCTED  100 each  12  . propranolol ER (INDERAL LA) 80 MG 24 hr capsule Take 1 capsule (80 mg total) by mouth daily.  90 capsule  3  . temazepam (RESTORIL) 15 MG capsule Take 15 mg by mouth at bedtime as needed. For sleep      .  HYDROcodone-acetaminophen (VICODIN) 5-500 MG per tablet Take 1-2 tablets by mouth 2 (two) times daily as needed. For pain      . metFORMIN (GLUCOPHAGE) 850 MG tablet TAKE 1 TABLET BY MOUTH TWICE DAILY WITH A MEAL . REPLACES ACTOPLUS MET  180 tablet  0  . omeprazole (PRILOSEC) 40 MG capsule Take 1 capsule (40 mg total) by mouth daily.  90 capsule  3  . omeprazole (PRILOSEC) 40 MG capsule TAKE 1 CAPSULE BY MOUTH DAILY  30 capsule  0  . oxyCODONE-acetaminophen (PERCOCET/ROXICET) 5-325 MG per tablet Take 1 tablet by mouth every 4 (four) hours as needed for pain.  15 tablet  0  . simvastatin (ZOCOR) 40 MG tablet Take 1 tablet (40 mg total) by mouth at bedtime.  90 tablet  3   No facility-administered medications prior to visit.    PAST MEDICAL HISTORY: Past Medical History  Diagnosis Date  . Allergy   . Anxiety   . Arthritis   . Depression   . Diabetes mellitus   . Hyperlipidemia   . HIV infection     pt denies this diagnosis.   . Movement disorder   . Hypertension   . Scoliosis   . Esophageal stricture   . Osteoarthritis     PAST SURGICAL HISTORY: Past Surgical History  Procedure Laterality Date  . Total knee arthroplasty      right  . Esophagogastric fundoplasty      some type "esoph surgery" per pt  . Colon surgery    . Abdominal hysterectomy      FAMILY HISTORY: Family History  Problem Relation Age of Onset  . Cerebral palsy Son   . Heart disease Mother   . Diabetes Mother   . Heart disease Father     SOCIAL HISTORY:  History   Social History  . Marital Status: Married    Spouse Name: [name]    Number of Children: 2  . Years of Education: 14   Occupational History  . Retired    Social History Main Topics  . Smoking status: Never Smoker   . Smokeless tobacco: Never Used  . Alcohol Use: No  . Drug Use: No  . Sexual Activity: Not on file   Other Topics Concern  . Not on file   Social History Narrative   Health Care POA:    Emergency Contact:  daughter, [name], [phone] husband, [name] [phone]   End of Life Plan:   Who lives with you: Lives with husband and son, [name]   Any pets: Rabbit, [name]   Diet: Patient has a varied diet but struggles with what to eat with hypertension, diabetes, parkinsons   Exercise: Patient does not have any regular exercise routine.   Seatbelts: Patient reports wearing her seatbelt when in vehicle.   Wynelle Link Exposure/Protection: Patient reports wearing sun block lotion daily.   Hobbies: Watching game shows, writing poetry, writing in journal, volunteering at day program with son.    Caffeine Use: very little  occasional              PHYSICAL EXAM  Filed Vitals:   01/18/13 1429  BP: 142/82  Pulse: 83  Temp: 98.7 F (37.1 C)  TempSrc: Oral  Height: 5\' 3"  (1.6 m)  Weight: 183 lb 8 oz (83.235 kg)    Not recorded    Body mass index is 32.51 kg/(m^2).  GENERAL EXAM: General: Patient is awake, alert and in no acute distress.  Well developed and groomed. Neck: Neck is supple. Cardiovascular: No carotid artery bruits.  Heart is regular rate and rhythm with no murmurs.  Neurologic Exam  Mental Status: Awake, alert. Language is fluent and comprehension intact. Cranial Nerves: Pupils are equal and reactive to light.  Visual fields are full to confrontation.  Conjugate eye movements are full and symmetric.  Facial sensation and strength are symmetric.  Hearing is intact.  Palate elevated symmetrically and uvula is midline.  Shoulder shrug is symmetric.  Tongue is midline. Motor: INTERMITTENT RESTING TREMOR OF BUE AND MOUTH. MINIMAL POSTURAL TREMOR. BRADYKINESIA AND RIGIDITY IN LUE>RUE, LLE>RLE. Normal bulk and tone.  Full strength in the upper and lower extremities.  No pronator drift. Sensory: Intact and symmetric to light touch. Coordination: No ataxia or dysmetria on finger-nose or rapid alternating movement testing. Gait and Station: Narrow based gait. TREMOR IN BUE WITH  WALKING.   DIAGNOSTIC DATA (LABS, IMAGING, TESTING) - I reviewed patient records, labs, notes, testing and imaging myself where available.  Lab Results  Component Value Date   WBC 5.1 01/13/2012   HGB 13.2 01/13/2012   HCT 39.4 01/13/2012   MCV 95.4 01/13/2012   PLT 280 01/13/2012      Component Value Date/Time   NA 141 10/06/2012 1025   K 4.3 10/06/2012 1025   CL 104 10/06/2012 1025   CO2 23 10/06/2012 1025   GLUCOSE 102* 10/06/2012 1025   BUN 14 10/06/2012 1025   CREATININE 1.03 10/06/2012 1025   CREATININE 0.86 01/13/2012 2328   CALCIUM 9.6 10/06/2012 1025   PROT 7.3 10/06/2012 1025   ALBUMIN 4.1 10/06/2012 1025   AST 20 10/06/2012 1025   ALT <8 10/06/2012 1025   ALKPHOS 87 10/06/2012 1025   BILITOT 0.5 10/06/2012 1025   GFRNONAA 69* 01/13/2012 2328   GFRAA 80* 01/13/2012 2328   Lab Results  Component Value Date   CHOL 155 04/13/2008   HDL 36* 04/13/2008   LDLCALC 72 04/13/2008   LDLDIRECT 151* 10/06/2012   TRIG 235* 04/13/2008   CHOLHDL 4.3 Ratio 04/13/2008   Lab Results  Component Value Date   HGBA1C 5.9 10/06/2012   Lab Results  Component Value Date   VITAMINB12 301 01/05/2008   Lab Results  Component Value Date   TSH 0.425 09/26/2010    07/05/10 MRI BRAIN - minimal scattered chronic small vessel ischemic disease   ASSESSMENT AND PLAN  67 y.o. female with progressive resting tremor of BUE and face.  Also has other parkinsonian features as well, including rigidity and cogwheeling in the upper extremities, decreased arm swing and slightly stooped posture, and bradykinesia.  Dx: parkinson's disease  PLAN: 1. continue carb/levo 1.5 tabs TID  2. Consider adding azilect in future. However, there is a caution with SSRI and azilect usage in general. Citalopram and sertraline have been studied in combination with Azilect, and appears to be safe in low dosages. Azilect and prozac usage was not studied. If patient is transitioned off of Prozac or switched to citalopram for  sertraline,  then we may consider Azilect in the future. Of note, patient previously had nausea with azilect usage in the past, but now it seems it have been the metformin/carb/levo combo use.   Return in about 1 year (around 01/18/2014) for with Heide Guile or Candies Palm.    Suanne Marker, MD 01/18/2013, 3:22 PM Certified in Neurology, Neurophysiology and Neuroimaging  [facility] [address], [address] [place], [place] [address] [phone]

## 2013-01-26 ENCOUNTER — Ambulatory Visit (INDEPENDENT_AMBULATORY_CARE_PROVIDER_SITE_OTHER): Payer: Medicare HMO | Admitting: Family Medicine

## 2013-01-26 VITALS — BP 151/64 | HR 56 | Temp 98.8°F | Ht 63.0 in | Wt 183.0 lb

## 2013-01-26 DIAGNOSIS — Z23 Encounter for immunization: Secondary | ICD-10-CM

## 2013-01-26 DIAGNOSIS — M25569 Pain in unspecified knee: Secondary | ICD-10-CM

## 2013-01-26 DIAGNOSIS — E119 Type 2 diabetes mellitus without complications: Secondary | ICD-10-CM

## 2013-01-26 DIAGNOSIS — M25562 Pain in left knee: Secondary | ICD-10-CM

## 2013-01-26 DIAGNOSIS — E538 Deficiency of other specified B group vitamins: Secondary | ICD-10-CM

## 2013-01-26 LAB — POCT GLYCOSYLATED HEMOGLOBIN (HGB A1C): Hemoglobin A1C: 5.7

## 2013-01-26 MED ORDER — CYANOCOBALAMIN 1000 MCG/ML IJ SOLN
1000.0000 ug | Freq: Once | INTRAMUSCULAR | Status: AC
  Administered 2013-01-26: 1000 ug via INTRAMUSCULAR

## 2013-01-26 MED ORDER — METFORMIN HCL 500 MG PO TABS: 500.0000 mg | ORAL_TABLET | Freq: Every day | ORAL | Status: DC

## 2013-01-26 MED ORDER — METHYLPREDNISOLONE ACETATE 80 MG/ML IJ SUSP
80.0000 mg | Freq: Once | INTRAMUSCULAR | Status: AC
  Administered 2013-01-26: 80 mg via INTRA_ARTICULAR

## 2013-01-26 MED ORDER — METHYLPREDNISOLONE ACETATE 40 MG/ML INJ SUSP (RADIOLOG: 80.0000 mg | Freq: Once | INTRAMUSCULAR | Status: DC

## 2013-01-26 NOTE — Progress Notes (Signed)
  Subjective:    Patient ID: [name], female    DOB: 05/11/1945, 67 y.o.   MRN: [id number]  HPI Once left knee injection. Had a corticosteroid injection about 3 months ago and had significant almost 80% improvement for 2 months. Her last 4 weeks she has had increasing pain. She is not interested in pursuing knee replacement on this side at this time. #2. Depressive symptoms are better. She has befriended a homeless teenager and taken him in to live with her family. This seems to be asked to working out fairly well. He has epilepsy and she has been able to help him understand the disease. She had hooked up with the neurologist. He is back in school. She is quite excited about these events #3. Parkinsonism: [facility] neurologists. He thinks she's stable right now. If her tremor gets worse he wants to start a new medicine and recommends we switch her from Prozac to a different SSRI. She would like to wait to that time to switch because the Prozac has been beneficial for her #4. Followup diabetes mellitus. No episodes of low blood sugar. No unusual weight loss or gain. No problems with medications.   Review of Systems No fever, sweats, chills.    Objective:   Physical Exam  Vital signs are reviewed GENERAL: Well-developed female no acute distress NEURO: Bilateral hand in mouth tremor at rest. Some stiffness home upon standing.  extremity: Left knee has some extra changes of osteoarthritis and synovitis but there is no erythema, warmth. She has tenderness to palpation medial joint line.  INJECTION: Patient was given informed consent, signed copy in the chart. Appropriate time out was taken. Area prepped and draped in usual sterile fashion. 1 cc of methylprednisolone 40 mg/ml plus  4 cc of 1% lidocaine without epinephrine was injected into the left knee using a(n) anterior medial approach. The patient tolerated the procedure well. There were no complications. Post procedure instructions were  given.      Assessment & Plan:

## 2013-01-26 NOTE — Patient Instructions (Signed)
Let's decrease your metformin from 850 mg once a day to a 500 mg tab once a day. I'll send in a new prescription. Her A1c was 5.7 today. As long as her 6.5 -7.0 I am pretty happy. I am glad that  things are working well for you. I'll see back in 3 months or sooner with problems.

## 2013-01-26 NOTE — Assessment & Plan Note (Signed)
Give an excellent A1c and a think we can decrease her metformin to 500 mg daily. Flu shot given today

## 2013-02-03 ENCOUNTER — Telehealth: Payer: Self-pay | Admitting: Family Medicine

## 2013-02-03 NOTE — Telephone Encounter (Signed)
Will route request to Dr. Jennette Kettle.  Senaida Ores, Maryjean Ka, RN

## 2013-02-03 NOTE — Telephone Encounter (Signed)
Pt is requesting a refill on her hydrocodone be left up front for pick up. JW

## 2013-02-04 MED ORDER — HYDROCODONE-ACETAMINOPHEN 5-325 MG PO TABS: ORAL_TABLET | ORAL | Status: DC

## 2013-02-04 NOTE — Telephone Encounter (Signed)
Dear [name] Team Please tell her it is up front [facility]! Denny Levy

## 2013-02-04 NOTE — Telephone Encounter (Signed)
Informed patient that rx is up front for pick up

## 2013-02-09 ENCOUNTER — Telehealth: Payer: Self-pay | Admitting: Family Medicine

## 2013-02-11 ENCOUNTER — Telehealth: Payer: Self-pay | Admitting: Family Medicine

## 2013-02-11 NOTE — Telephone Encounter (Signed)
Pt called to give Dr. Jennette Kettle some information of the neurologist she would like to see. Dr. Charlott Holler and his number is [phone]. Myriam Jacobson

## 2013-02-11 NOTE — Telephone Encounter (Signed)
Spoke w her re [name]. Mom needs  To call [facility]--they have new doctor. Have her number and info Dr. Melody Haver [phone]  Fax [phone] Denny Levy

## 2013-02-14 NOTE — Telephone Encounter (Signed)
Please advise. Dymir Neeson S  

## 2013-02-18 ENCOUNTER — Other Ambulatory Visit: Payer: Self-pay | Admitting: Family Medicine

## 2013-02-23 ENCOUNTER — Other Ambulatory Visit: Payer: Self-pay | Admitting: Diagnostic Neuroimaging

## 2013-02-25 ENCOUNTER — Other Ambulatory Visit: Payer: Self-pay

## 2013-03-01 NOTE — Telephone Encounter (Signed)
Spoke to [employer] states ref was for her son and to disregard message Dr Jennette Kettle was infiormed. Serrina Minogue, Virgel Bouquet

## 2013-03-01 NOTE — Telephone Encounter (Signed)
Dear Cliffton Asters Team I am confused---is this a neurologist for her? Where is his practice? Let me know THANKS!Denny Levy

## 2013-03-03 ENCOUNTER — Telehealth: Payer: Self-pay | Admitting: Family Medicine

## 2013-03-03 NOTE — Telephone Encounter (Signed)
Needs refill on hydrocodone Please notify when available

## 2013-03-04 MED ORDER — HYDROCODONE-ACETAMINOPHEN 5-325 MG PO TABS: ORAL_TABLET | ORAL | Status: DC

## 2013-03-04 NOTE — Telephone Encounter (Signed)
This is up front plz let her know THANKS! Harlie Buening  

## 2013-03-04 NOTE — Telephone Encounter (Signed)
Attempted to call patient to inform of below, no answer

## 2013-03-04 NOTE — Telephone Encounter (Signed)
Pt called to check the status of her refill request on Hydrocodone. She would like to pick up today since it is the weekend. jw

## 2013-03-15 ENCOUNTER — Encounter: Payer: Self-pay | Admitting: Family Medicine

## 2013-03-15 ENCOUNTER — Ambulatory Visit (HOSPITAL_COMMUNITY)
Admission: RE | Admit: 2013-03-15 | Discharge: 2013-03-15 | Disposition: A | Discharge status: Home / Self Care | Payer: Medicare HMO | Source: Ambulatory Visit | Attending: Family Medicine | Admitting: Family Medicine

## 2013-03-15 ENCOUNTER — Ambulatory Visit (INDEPENDENT_AMBULATORY_CARE_PROVIDER_SITE_OTHER): Payer: Medicare HMO | Admitting: Family Medicine

## 2013-03-15 VITALS — BP 155/74 | HR 114 | Temp 98.3°F | Ht 63.0 in | Wt 181.0 lb

## 2013-03-15 DIAGNOSIS — Z96659 Presence of unspecified artificial knee joint: Secondary | ICD-10-CM | POA: Insufficient documentation

## 2013-03-15 DIAGNOSIS — W19XXXA Unspecified fall, initial encounter: Secondary | ICD-10-CM | POA: Insufficient documentation

## 2013-03-15 DIAGNOSIS — Z9181 History of falling: Secondary | ICD-10-CM

## 2013-03-15 DIAGNOSIS — S8263XA Displaced fracture of lateral malleolus of unspecified fibula, initial encounter for closed fracture: Secondary | ICD-10-CM | POA: Insufficient documentation

## 2013-03-15 MED ORDER — OXYCODONE-ACETAMINOPHEN 5-325 MG PO TABS: 1.0000 | ORAL_TABLET | Freq: Three times a day (TID) | ORAL | Status: DC | PRN

## 2013-03-15 NOTE — Progress Notes (Signed)
   Subjective:    Patient ID: [name], female    DOB: 18-Feb-1946, 67 y.o.   MRN: [id number]  HPI  Fall: patient states she fell down 4 cemented steps, Thanksgiving morning. She was in a non-familiar environment, and was rushed didn't realize there was a step-off in fell down the steps. She describes as a 9-10 out of 10 pain is unable to bear weight on her right ankle. She states standing and attempting to bear weight causes her pain in her right ankle.  She reports her left ankle and foot are mildly tender but able to bear weight. Her right hip has a small bruise but she states that she has no pain in her hip.  She states she is having left knee pain that has increased since the fall. She reports having arthritis in that knee and having chronic pain, but this is worse. It hurts when she attempts to walk or bear weight, in her left knee.  She reports she has a bruise on her right knee, but no pain in this knee. 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  She takes Vicodin daily for her arthritic pain and states they is not covering her acute pain. She did not want to go to the emergency room what happened because she wanted to wait to come into the clinic.  Review of Systems Negative, with the exception of above mentioned in HPI     Objective:   Physical Exam Gen: NAD. Limping not bearing weight on right foot. Ext:  - mild bruise on right hip. Not tender to palpitation. Normal range of motion with out pain. - Right knee with bruising on the lateral tibial tuberosity area. No effusions noted. No erythema or swelling. Normal passive and active ROM, without pain. Negative anterior and posterior drawer test. No ligament laxity noted, patient had pain laterally with varus pressure. - right ankle and foot with bruising around the medial ankle bottom of foot and across the toes. Moderate to severe swelling of the ankle joint, foot and toes. Pain with palpation to the fifth  metatarsal base, medial malleolus edge, lateral malleolus  and navicular. Patient unable to weight bear on this foot. - Left knee without bruising, swelling or effusion. No erythema noted. Normal passive and active range of motion in flexion and extension. Medial knee tenderness to palpation. Negative anterior posterior drawer test. No ligament laxity noted. Pain with both valgus and varus pressure to knee.  - Left ankle and foot with bruising over her third metatarsal.  Mild swelling of the joint. Pain with palpation to the fifth metatarsal base and medial malleolus. Able to weight-bear on this leg. Passive range of motion without difficulty, pain with active range of motion.  Bilateral PT and DP equal. Brisk cap refill LE.

## 2013-03-15 NOTE — Patient Instructions (Addendum)
Ankle Sprain An ankle sprain is an injury to the strong, fibrous tissues (ligaments) that hold the bones of your ankle joint together.  CAUSES An ankle sprain is usually caused by a fall or by twisting your ankle. Ankle sprains most commonly occur when you step on the outer edge of your foot, and your ankle turns inward. People who participate in sports are more prone to these types of injuries.  SYMPTOMS   Pain in your ankle. The pain may be present at rest or only when you are trying to stand or walk.  Swelling.  Bruising. Bruising may develop immediately or within 1 to 2 days after your injury.  Difficulty standing or walking, particularly when turning corners or changing directions. DIAGNOSIS  Your caregiver will ask you details about your injury and perform a physical exam of your ankle to determine if you have an ankle sprain. During the physical exam, your caregiver will press on and apply pressure to specific areas of your foot and ankle. Your caregiver will try to move your ankle in certain ways. An X-ray exam may be done to be sure a bone was not broken or a ligament did not separate from one of the bones in your ankle (avulsion fracture).  TREATMENT  Certain types of braces can help stabilize your ankle. Your caregiver can make a recommendation for this. Your caregiver may recommend the use of medicine for pain. If your sprain is severe, your caregiver may refer you to a surgeon who helps to restore function to parts of your skeletal system (orthopedist) or a physical therapist. HOME CARE INSTRUCTIONS   Apply ice to your injury for 1 2 days or as directed by your caregiver. Applying ice helps to reduce inflammation and pain.  Put ice in a plastic bag.  Place a towel between your skin and the bag.  Leave the ice on for 15-20 minutes at a time, every 2 hours while you are awake.  Only take over-the-counter or prescription medicines for pain, discomfort, or fever as directed by  your caregiver.  Elevate your injured ankle above the level of your heart as much as possible for 2 3 days.  If your caregiver recommends crutches, use them as instructed. Gradually put weight on the affected ankle. Continue to use crutches or a cane until you can walk without feeling pain in your ankle.  If you have a plaster splint, wear the splint as directed by your caregiver. Do not rest it on anything harder than a pillow for the first 24 hours. Do not put weight on it. Do not get it wet. You may take it off to take a shower or bath.  You may have been given an elastic bandage to wear around your ankle to provide support. If the elastic bandage is too tight (you have numbness or tingling in your foot or your foot becomes cold and blue), adjust the bandage to make it comfortable.  If you have an air splint, you may blow more air into it or let air out to make it more comfortable. You may take your splint off at night and before taking a shower or bath. Wiggle your toes in the splint several times per day to decrease swelling. SEEK MEDICAL CARE IF:   You have rapidly increasing bruising or swelling.  Your toes feel extremely cold or you lose feeling in your foot.  Your pain is not relieved with medicine. SEEK IMMEDIATE MEDICAL CARE IF:  Your toes are numb   or blue.  You have severe pain that is increasing. MAKE SURE YOU:   Understand these instructions.  Will watch your condition.  Will get help right away if you are not doing well or get worse. Document Released: 03/31/2005 Document Revised: 12/24/2011 Document Reviewed: 04/12/2011 [facility] Patient Information 2014 [place], [place].  Have ordered x-rays for both your knees, ankles and feet. Please have these x-rays completed today and keep your appointment with Dr. Jennette Kettle tomorrow to followup. I have called shoe and a prescription for Percocet #20. This is a one-time prescription for this fall only, please do not take your Vicodin  in addition to Percocet.

## 2013-03-15 NOTE — Assessment & Plan Note (Signed)
Ordered x-rays of bilateral knees and ankles today. Concern for right ankle fracture greater than left from exam. Although able to bear weight on the left she was tender in appropriate places to warrant x-ray today.  Prescribe Percocet #20 for acute injury. Explained to her not to take her Vicodin during the time when she takes these Percocet. Explained this is a one-time prescription just for acute injury. Patient in understanding. Advised her to rest, elevate and apply Ace bandage to right ankle when she gets home. She has an appointment scheduled tomorrow with PCP.

## 2013-03-16 ENCOUNTER — Encounter: Payer: Self-pay | Admitting: Family Medicine

## 2013-03-16 ENCOUNTER — Ambulatory Visit (INDEPENDENT_AMBULATORY_CARE_PROVIDER_SITE_OTHER): Payer: Medicare HMO | Admitting: Family Medicine

## 2013-03-16 VITALS — BP 141/97 | HR 128 | Temp 98.3°F | Ht 63.0 in | Wt 181.4 lb

## 2013-03-16 DIAGNOSIS — R5381 Other malaise: Secondary | ICD-10-CM

## 2013-03-16 DIAGNOSIS — S92309A Fracture of unspecified metatarsal bone(s), unspecified foot, initial encounter for closed fracture: Secondary | ICD-10-CM

## 2013-03-16 DIAGNOSIS — S92301A Fracture of unspecified metatarsal bone(s), right foot, initial encounter for closed fracture: Secondary | ICD-10-CM

## 2013-03-16 MED ORDER — CYANOCOBALAMIN 1000 MCG/ML IJ SOLN
1000.0000 ug | Freq: Once | INTRAMUSCULAR | Status: AC
  Administered 2013-03-16: 1000 ug via INTRAMUSCULAR

## 2013-03-16 NOTE — Patient Instructions (Signed)
Stay off your foot as much as possible until you see the orthopedist. Please talk to your counselor about seeing her for some supportive counseling--at least during the holidays. Ask your psychiatrist about maybe increasing the prozac dose. I'm really sorry about your foot! Next year at Thanksgiving time I am going to wrap you in a big wad of bubble pak! I would like to see you back in a few weeks.

## 2013-03-21 NOTE — Progress Notes (Signed)
   Subjective:    Patient ID: [name], female    DOB: March 15, 1946, 67 y.o.   MRN: [id number]  HPI  Patient for followup of fall. She was visiting at someone's house, hurt her son called her from the other room, tried to get to him hardly and missed a step. She fell down landing on both knees and her hands. She was seen here yesterday with concern for right foot fracture. She's gotten the x-rays. She's here for followup.  Review of Systems Pain in foot. No headache. No fever. No dizziness.    Objective:   Physical Exam Vital signs are reviewed GENERAL: Well-developed female no acute distress FOOT: Right. Tender palpation over the fifth metatarsal proximally. IMAGING: Reviewed her bilateral knee x-rays and her bilateral feet x-rays. The only significant acute  pathology is a transverse fracture of the proximal fifth metatarsal on the right. Her existing hardware is intact.       Assessment & Plan:  Fifth metatarsal fracture. I have set her up to see the orthopedist this afternoon.

## 2013-03-30 ENCOUNTER — Telehealth: Payer: Self-pay | Admitting: Family Medicine

## 2013-03-30 NOTE — Telephone Encounter (Signed)
Pt called and would like a refill hydrocodone left up front for pickup. jw

## 2013-03-31 MED ORDER — HYDROCODONE-ACETAMINOPHEN 5-325 MG PO TABS: ORAL_TABLET | ORAL | Status: DC

## 2013-03-31 NOTE — Telephone Encounter (Signed)
Dear Cliffton Asters Team Please tekll her it is up front and Happy Holidays! Denny Levy

## 2013-04-01 NOTE — Telephone Encounter (Signed)
LVM for patient to call back. ?

## 2013-04-01 NOTE — Telephone Encounter (Signed)
Spoke with patient and it was already picked up

## 2013-04-18 ENCOUNTER — Telehealth: Payer: Self-pay | Admitting: *Deleted

## 2013-04-18 DIAGNOSIS — S92309A Fracture of unspecified metatarsal bone(s), unspecified foot, initial encounter for closed fracture: Secondary | ICD-10-CM

## 2013-04-18 NOTE — Telephone Encounter (Signed)
Since patients insurance is [employer], [place] will need a new referral for this year.  Will forward to Dr. Nori Riis. Clinton Sawyer, Salome Spotted

## 2013-04-19 DIAGNOSIS — S92309A Fracture of unspecified metatarsal bone(s), unspecified foot, initial encounter for closed fracture: Secondary | ICD-10-CM | POA: Insufficient documentation

## 2013-05-02 ENCOUNTER — Other Ambulatory Visit: Payer: Self-pay | Admitting: Family Medicine

## 2013-05-02 NOTE — Telephone Encounter (Signed)
Needs refill on hydrocodone.  Please call when ready for pickup °

## 2013-05-03 MED ORDER — HYDROCODONE-ACETAMINOPHEN 5-325 MG PO TABS: ORAL_TABLET | ORAL | Status: DC

## 2013-05-03 NOTE — Telephone Encounter (Signed)
Patient calls again, Please call her once Dr. Andria Frames has printed the RX. Thanks!

## 2013-05-03 NOTE — Telephone Encounter (Signed)
Dear [name] Team H\See if you can find someone to write this so she can pick it up [facility]! [name]

## 2013-06-01 ENCOUNTER — Ambulatory Visit (INDEPENDENT_AMBULATORY_CARE_PROVIDER_SITE_OTHER): Payer: Medicare HMO | Admitting: Family Medicine

## 2013-06-01 DIAGNOSIS — K229 Disease of esophagus, unspecified: Secondary | ICD-10-CM

## 2013-06-01 DIAGNOSIS — IMO0002 Reserved for concepts with insufficient information to code with codable children: Secondary | ICD-10-CM

## 2013-06-01 DIAGNOSIS — M25569 Pain in unspecified knee: Secondary | ICD-10-CM

## 2013-06-01 DIAGNOSIS — Z96659 Presence of unspecified artificial knee joint: Secondary | ICD-10-CM

## 2013-06-01 DIAGNOSIS — M25552 Pain in left hip: Secondary | ICD-10-CM

## 2013-06-01 DIAGNOSIS — R1310 Dysphagia, unspecified: Secondary | ICD-10-CM

## 2013-06-01 DIAGNOSIS — M1710 Unilateral primary osteoarthritis, unspecified knee: Secondary | ICD-10-CM

## 2013-06-01 DIAGNOSIS — M25559 Pain in unspecified hip: Secondary | ICD-10-CM

## 2013-06-01 DIAGNOSIS — M25562 Pain in left knee: Secondary | ICD-10-CM

## 2013-06-01 DIAGNOSIS — M25551 Pain in right hip: Secondary | ICD-10-CM

## 2013-06-01 MED ORDER — HYDROCODONE-ACETAMINOPHEN 5-325 MG PO TABS: ORAL_TABLET | ORAL | Status: DC

## 2013-06-01 NOTE — Patient Instructions (Signed)
Let me see you back in 2-3 weeks. I will try to figure out how to order a swallowing study and my nurse will call you with that. Get your x rays at your convenience

## 2013-06-02 ENCOUNTER — Other Ambulatory Visit: Payer: Self-pay | Admitting: Family Medicine

## 2013-06-02 DIAGNOSIS — R1310 Dysphagia, unspecified: Secondary | ICD-10-CM | POA: Insufficient documentation

## 2013-06-02 NOTE — Assessment & Plan Note (Signed)
Refilled pain meds today vicodin 2 po bid #120 no refills

## 2013-06-02 NOTE — Progress Notes (Signed)
   Subjective:    Patient ID: [name], female    DOB: Jan 15, 1946, 68 y.o.   MRN: [id number]  HPI Left buttock and left knee pain. Feels like arthritis. Truly been bothering her. Pain can be 6-8/10 at rest. Activity doesn't really make it much worse. He is keeping her from sleeping.  Foot fracture is healed well.  #3. Having some problems swallowing particularly certain foods. She doesn't have a problem swallowing liquids or her pills. No choking or emesis just difficulty swallowing .   Review of Systems Denies fever, sweats, chills, unusual weight change, nausea vomiting. Has had no rash. Sleeping okay except for the hip and knee pain.    Objective:   Physical Exam Vital signs are reviewed GENERAL: Well-developed female no acute distress in KNEE: Left. External changes consistent with synovitis. There is no warmth or erythema. There is no true joint effusion. She lacks full extension by about 5. Medial joint line tenderness. Popliteal space and calf are soft. HIPS: Pain with external rotation on the left and she has decreased internal and external rotation. Hip flexion strength is normal. Tenderness to palpation over the SI joint and FABRE tests very positive. NECK: No lymphadenopathy no thyromegaly no mass. Trachea is midline. In CV: Regular rate and rhythm LUNGS: Clear to auscultation bilaterally  INJECTION: Patient was given informed consent, signed copy in the chart. Appropriate time out was taken. Area prepped and draped in usual sterile fashion. One cc of methylprednisolone 40 mg/ml plus  2 cc of 1% lidocaine without epinephrine was injected into the left SI joint using a(n) posterior approach. The patient tolerated the procedure well. There were no complications. Post procedure instructions were given. INJECTION: Patient was given informed consent, signed copy in the chart. Appropriate time out was taken. Area prepped and draped in usual sterile fashion. One cc of  methylprednisolone 40 mg/ml plus  4 cc of 1% lidocaine without epinephrine was injected into the left knee using a(n) anterior medial approach. The patient tolerated the procedure well. There were no complications. Post procedure instructions were given.        Assessment & Plan:

## 2013-06-02 NOTE — Assessment & Plan Note (Signed)
She status post TKR on the right. We talked about getting knee replacement on the left and she just does not want to pursue that at this time because she's got too much going on her life. I think the left knee and hip pain are related. Retries a corticosteroid injections today and I'll get some x-rays of her pelvis. I'll see her back in 3-4 weeks to follow this up.

## 2013-06-02 NOTE — Assessment & Plan Note (Signed)
He would probably need to set her up with a repeat swallowing study outpatient and if that's abnormal send her back to GI. Will call her with that appointment.

## 2013-06-03 ENCOUNTER — Other Ambulatory Visit (HOSPITAL_COMMUNITY): Payer: Self-pay | Admitting: Family Medicine

## 2013-06-03 DIAGNOSIS — R1310 Dysphagia, unspecified: Secondary | ICD-10-CM

## 2013-06-10 ENCOUNTER — Ambulatory Visit (HOSPITAL_COMMUNITY)
Admission: RE | Admit: 2013-06-10 | Discharge: 2013-06-10 | Disposition: A | Discharge status: Home / Self Care | Payer: Medicare HMO | Source: Ambulatory Visit | Attending: Family Medicine | Admitting: Family Medicine

## 2013-06-10 DIAGNOSIS — R1310 Dysphagia, unspecified: Secondary | ICD-10-CM | POA: Insufficient documentation

## 2013-06-10 DIAGNOSIS — K229 Disease of esophagus, unspecified: Secondary | ICD-10-CM

## 2013-06-10 NOTE — Procedures (Signed)
Objective Swallowing Evaluation: Modified Barium Swallowing Study  Patient Details  Name: [name] MRN: [id number] Date of Birth: 03/26/1946  Today's Date: 06/10/2013 Time: 1150-1220 SLP Time Calculation (min): 30 min  Past Medical History:  Past Medical History  Diagnosis Date  . Allergy   . Anxiety   . Arthritis   . Depression   . Diabetes mellitus   . Hyperlipidemia   . HIV infection     pt denies this diagnosis.   . Movement disorder   . Hypertension   . Scoliosis   . Esophageal stricture   . Osteoarthritis    Past Surgical History:  Past Surgical History  Procedure Laterality Date  . Total knee arthroplasty      right  . Esophagogastric fundoplasty      some type "esoph surgery" per pt  . Colon surgery    . Abdominal hysterectomy     HPI:  Pt is a 68 year old female, history per pt. She reports Parkison's, GERD and a vague esophageal laparoscopic surgery 15 years ago. She reports that solid foods, particularly meats but also sometimes fruit and bread, become lodged in her throat and she has to regurgitate them. She denies any pna or choking with liquids.      Assessment / Plan / Recommendation Clinical Impression  Dysphagia Diagnosis: Suspected primary esophageal dysphagia Clinical impression: Pt presents with adequate oropharyngeal swallow function: normal timing, strength and airway protection. The oral phase is not impaired, but missing dentition does make it difficult for pt to fully masticate tough textures. Esophageal sweep revealed appearance of distal stasis with solids and probable impaired peristalsis (no radiologist present to confirm). Suspect that solid boluses are likely not transited well through esophagus, especially if not thoroughly masticated. The pt is recommended to prepare tough solids prior to chewing, cutting up more finely. also provided esophageal strategies that may facilitate transit. Pt may need f/u for esophageal function. 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 She could benefit from an exercise program from a skilled outpatient therapist. She plans to discuss this with her MD.     Treatment Recommendation  Defer treatment plan to SLP at (Comment)    Diet Recommendation Dysphagia 3 (Mechanical Soft);Thin liquid   Liquid Administration via: Cup;Straw Medication Administration: Whole meds with liquid Supervision: Patient able to self feed Compensations: Follow solids with liquid Postural Changes and/or Swallow Maneuvers: Seated upright 90 degrees;Upright 30-60 min after meal    Other  Recommendations Oral Care Recommendations: Patient independent with oral care   Follow Up Recommendations  Outpatient SLP    Frequency and Duration        Pertinent Vitals/Pain NA    SLP Swallow Goals     General HPI: Pt is a 68 year old female, history per pt. She reports Parkison's, GERD and a vague esophageal laparoscopic surgery 15 years ago. She reprots that solid foods, particularly meats but also sometimes fruit and bread, become lodged in her throat and she has to regurgitate them. She denies any pna or choking with liquids.  Type of Study: Modified Barium Swallowing Study Reason for Referral: Objectively evaluate swallowing function Diet Prior to this Study: Regular;Thin liquids Temperature Spikes Noted: N/A Respiratory Status: Room air History of Recent Intubation: No Behavior/Cognition: Alert;Cooperative;Pleasant mood Oral Cavity - Dentition: Missing dentition (no back dentition, partial caused her to gag. ) Oral Motor / Sensory Function: Impaired motor (resting tremor)  Self-Feeding Abilities: Able to feed self Patient Positioning: Upright in chair Baseline Vocal Quality: Clear Volitional Cough: Strong Volitional Swallow: Able to elicit Anatomy: Within functional limits Pharyngeal Secretions: Not observed  secondary MBS    Reason for Referral Objectively evaluate swallowing function   Oral Phase Oral Preparation/Oral Phase Oral Phase: Impaired Oral Phase - Comment Oral Phase - Comment: Pt must use only front dentition to masticate solids, otherwise WNL.    Pharyngeal Phase Pharyngeal Phase Pharyngeal Phase: Within functional limits  Cervical Esophageal Phase    GO    Cervical Esophageal Phase Cervical Esophageal Phase: Impaired Cervical Esophageal Phase - Comment Cervical Esophageal Comment: Appearance of stasis and upward surging of stasis in the mid to distal esophagus, particularly with solids. Slow to clear with liquid wash.     Functional Assessment Tool Used: clinical judgement Functional Limitations: Swallowing Swallow Current Status (N3005): At least 1 percent but less than 20 percent impaired, limited or restricted Swallow Goal Status [phone]): At least 1 percent but less than 20 percent impaired, limited or restricted Swallow Discharge Status [phone]): At least 1 percent but less than 20 percent impaired, limited or restricted   [facility], MA CCC-SLP [phone]  Lynann Beaver 06/10/2013, 1:32 PM

## 2013-06-14 ENCOUNTER — Encounter: Payer: Self-pay | Admitting: Family Medicine

## 2013-06-22 ENCOUNTER — Ambulatory Visit (INDEPENDENT_AMBULATORY_CARE_PROVIDER_SITE_OTHER): Payer: Medicare HMO | Admitting: Family Medicine

## 2013-06-22 ENCOUNTER — Encounter: Payer: Self-pay | Admitting: Family Medicine

## 2013-06-22 VITALS — BP 157/75 | HR 106 | Temp 98.0°F | Wt 183.9 lb

## 2013-06-22 DIAGNOSIS — G20 Parkinson's disease: Secondary | ICD-10-CM

## 2013-06-22 DIAGNOSIS — R5383 Other fatigue: Secondary | ICD-10-CM

## 2013-06-22 DIAGNOSIS — R5381 Other malaise: Secondary | ICD-10-CM

## 2013-06-22 DIAGNOSIS — I10 Essential (primary) hypertension: Secondary | ICD-10-CM

## 2013-06-22 DIAGNOSIS — E119 Type 2 diabetes mellitus without complications: Secondary | ICD-10-CM

## 2013-06-22 DIAGNOSIS — K229 Disease of esophagus, unspecified: Secondary | ICD-10-CM

## 2013-06-22 LAB — POCT GLYCOSYLATED HEMOGLOBIN (HGB A1C): Hemoglobin A1C: 6

## 2013-06-22 MED ORDER — HYDROCODONE-ACETAMINOPHEN 5-325 MG PO TABS: ORAL_TABLET | ORAL | Status: DC

## 2013-06-22 MED ORDER — OXYCODONE-ACETAMINOPHEN 5-325 MG PO TABS: 1.0000 | ORAL_TABLET | Freq: Three times a day (TID) | ORAL | Status: DC | PRN

## 2013-06-22 MED ORDER — CYANOCOBALAMIN 1000 MCG/ML IJ SOLN
1000.0000 ug | Freq: Once | INTRAMUSCULAR | Status: AC
  Administered 2013-06-22: 1000 ug via INTRAMUSCULAR

## 2013-06-23 NOTE — Assessment & Plan Note (Signed)
Diabetes is under excellent control with her A1c 6.0 today she's had no episodes of low blood sugar. We will continue her current medication regimen without change and I'll see her back in 3 months.

## 2013-06-23 NOTE — Assessment & Plan Note (Addendum)
Parkinson's disease seems fairly stable. She will continue followup with neurology regarding this. Regarding her speech referral I will put that in for her.

## 2013-06-23 NOTE — Progress Notes (Signed)
   Subjective:    Patient ID: [name], female    DOB: [birth year], 68 y.o.   MRN: [id number]  HPI  #1 pre-followup diabetes mellitus. No episodes of low blood sugar. No problems with her medicines. Trying to follow a good diet. No regular exercise probably secondary to her significant arthritic joints. #2. Hypertension. We had placed her on Inderal probably for the benefit we thought it might exert on her tremor. She did not feel like the tremor was being helped at all by the Inderal so she stopped it. She does note that today her blood pressure is elevated. She's had no symptoms of chest pain shortness of breath lower extremity edema or palpitations. #3. Parkinsonian syndrome. She is followed by neurology. She has good days and bad days. Today is a particularly good day. On bad days she has a lot problems with tremor and sometimes some balance issues although those are overall better since she got her knee replaced the other knee is bothering her quite a bit and she does not see a time in the near future which she can feasibly get it replaced. She would like to followup with intermittent corticosteroid shots. She had one last month and it seemed to help quite a bit. She still getting benefit from that. She expects a benefit to start wearing off the next month or so so she would like a refill for pain medicine. #4. Needs her B12 shot. #5. I sent her for a swallowing study they told her she could probably benefit from some speech therapy. Evidently there is a particular speech therapist he works with Parkinson's patients. She would like me to put a referral in.  Review of Systems See history of present illness above. Additionally pertinent review of systems is negative for chest pain, unusual weight change, hypoglycemic episodes, shortness of breath, depression, hallucination.    Objective:   Physical Exam  Vital signs are reviewed GENERAL: Well-developed female no acute distress NEURO:  Resting tremor both of the head and bilateral hand. Mild voice tremor. CV: Regular rate and rhythm Lungs: Clear to auscultation bilaterally PSYCHIATRIC: Alert and oriented x4. Interactive. Asks answers questions appropriately. Intact sense of humor.      Assessment & Plan:

## 2013-06-23 NOTE — Assessment & Plan Note (Signed)
Report from SLP. Pt presents with adequate oropharyngeal swallow function: normal timing, strength and airway protection.   The oral phase is not impaired, but missing dentition does make it difficult for pt to fully masticate tough textures. . Suspect that solid boluses are likely not transited well through esophagus, especially if not thoroughly masticated. The pt is recommended to prepare tough solids prior to chewing, cutting up more finely. also provided esophageal strategies that may facilitate transit.   Pt may need f/u for esophageal function. 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 She could benefit from an exercise program from a skilled outpatient therapist.

## 2013-06-23 NOTE — Assessment & Plan Note (Signed)
When she stopped the Inderal which she was taking partially for her tremor, her blood pressure once again became elevated so we talked about options. I opted to give her a different medicine but since she's having no problems with the Inderal it seemed to be working well for her she'll restart on that.

## 2013-07-19 ENCOUNTER — Ambulatory Visit: Payer: Medicare HMO | Attending: Family Medicine

## 2013-07-26 ENCOUNTER — Telehealth: Payer: Self-pay | Admitting: Family Medicine

## 2013-07-26 NOTE — Telephone Encounter (Signed)
Need refill in her hydrocodone

## 2013-07-27 NOTE — Telephone Encounter (Signed)
Pt called to check the status of her hydrocodone refill . [name]

## 2013-07-28 MED ORDER — HYDROCODONE-ACETAMINOPHEN 5-325 MG PO TABS: ORAL_TABLET | ORAL | Status: DC

## 2013-07-28 NOTE — Telephone Encounter (Signed)
Called pt. Informed. [name]

## 2013-07-28 NOTE — Telephone Encounter (Signed)
Dear [name] Team Please tell her it is up front Sorry for inconvenience [facility]! [name]

## 2013-08-18 ENCOUNTER — Other Ambulatory Visit: Payer: Self-pay | Admitting: Family Medicine

## 2013-08-24 ENCOUNTER — Ambulatory Visit: Payer: Medicare HMO | Attending: Family Medicine

## 2013-08-31 ENCOUNTER — Ambulatory Visit (INDEPENDENT_AMBULATORY_CARE_PROVIDER_SITE_OTHER): Payer: Medicare HMO | Admitting: Family Medicine

## 2013-08-31 ENCOUNTER — Encounter: Payer: Self-pay | Admitting: Family Medicine

## 2013-08-31 VITALS — BP 158/75 | HR 72 | Temp 98.1°F | Ht 63.0 in | Wt 185.0 lb

## 2013-08-31 DIAGNOSIS — M1710 Unilateral primary osteoarthritis, unspecified knee: Secondary | ICD-10-CM

## 2013-08-31 DIAGNOSIS — E538 Deficiency of other specified B group vitamins: Secondary | ICD-10-CM

## 2013-08-31 DIAGNOSIS — IMO0002 Reserved for concepts with insufficient information to code with codable children: Secondary | ICD-10-CM

## 2013-08-31 MED ORDER — HYDROCODONE-ACETAMINOPHEN 5-325 MG PO TABS: ORAL_TABLET | ORAL | Status: DC

## 2013-08-31 MED ORDER — CYANOCOBALAMIN 1000 MCG/ML IJ SOLN
1000.0000 ug | Freq: Once | INTRAMUSCULAR | Status: AC
  Administered 2013-08-31: 1000 ug via INTRAMUSCULAR

## 2013-08-31 MED ORDER — METHYLPREDNISOLONE ACETATE 80 MG/ML IJ SUSP
80.0000 mg | Freq: Once | INTRAMUSCULAR | Status: AC
  Administered 2013-08-31: 80 mg via INTRA_ARTICULAR

## 2013-08-31 NOTE — Assessment & Plan Note (Signed)
CSI today Refilled pain meds

## 2013-08-31 NOTE — Progress Notes (Signed)
Patient ID: [name], female   DOB: 07/10/1945, 68 y.o.   MRN: [id number]  YVONNE STOPHER - 68 y.o. female MRN [id number]  Date of birth: 1945-07-30    SUBJECTIVE:     Knee pain. Left. Planning a trip with her family, going be doing a lot of walking, wants to be as comfortable as possible. She is status post right TKR but has not yet found time in her current situation to get the left one replaced. She would still like to consider that in the future. She is using minimal amounts of her pain medicine he does need a refill. #2. Needs handicap sticker filled out for her son #3. Needs B12 shot  ROS:     No fever, sweats, chills, unusual weight change.  PERTINENT  PMH / PSH FH / / SH:  Past Medical, Surgical, Social, and Family History Reviewed & Updated per EMR.  Pertinent Historical Findings include:  Status post TKR on right. History diabetes mellitus with good control by A1c at last office visit. Nonsmoker  OBJECTIVE: BP 158/75  Pulse 72  Temp(Src) 98.1 F (36.7 C) (Oral)  Ht 5\' 3"  (1.6 m)  Wt 185 lb (83.915 kg)  BMI 32.78 kg/m2  Physical Exam:  Vital signs are reviewed. GENERAL: Well-developed female no acute distress KNEES: Left knee has small amount effusion, boggy synovitis, no erythema. Medial joint line tenderness. Full flexion and extension. Soft calf. Distally neurovascularly intact. INJECTION: Patient was given informed consent, signed copy in the chart. Appropriate time out was taken. Area prepped and draped in usual sterile fashion. One cc of methylprednisolone 80 mg/ml plus  4 cc of 1% lidocaine without epinephrine was injected into the left knee using a(n) anterior medial approach. The patient tolerated the procedure well. There were no complications. Post procedure instructions were given.   ASSESSMENT & PLAN: See problem based charting & AVS for pt instructions. B 12 shot given

## 2013-09-12 ENCOUNTER — Ambulatory Visit: Payer: Medicare HMO | Attending: Family Medicine

## 2013-09-26 ENCOUNTER — Telehealth: Payer: Self-pay | Admitting: Family Medicine

## 2013-09-26 NOTE — Telephone Encounter (Signed)
Needs refill on hydrocodone and acetaminophen

## 2013-09-28 NOTE — Telephone Encounter (Signed)
Patient calls again.  °

## 2013-09-29 MED ORDER — HYDROCODONE-ACETAMINOPHEN 5-325 MG PO TABS: ORAL_TABLET | ORAL | Status: DC

## 2013-09-29 NOTE — Telephone Encounter (Signed)
Spoke with patient and informed her of below 

## 2013-09-29 NOTE — Telephone Encounter (Signed)
Dear [name] Team Please tell her sorry for delay---it isn up front at desk [facility]! [name]

## 2013-10-17 ENCOUNTER — Encounter: Payer: Self-pay | Admitting: Family Medicine

## 2013-10-17 ENCOUNTER — Ambulatory Visit (INDEPENDENT_AMBULATORY_CARE_PROVIDER_SITE_OTHER): Payer: Medicare HMO | Admitting: Family Medicine

## 2013-10-17 VITALS — BP 147/85 | HR 74 | Temp 97.8°F | Wt 187.0 lb

## 2013-10-17 DIAGNOSIS — E538 Deficiency of other specified B group vitamins: Secondary | ICD-10-CM

## 2013-10-17 DIAGNOSIS — M479 Spondylosis, unspecified: Secondary | ICD-10-CM

## 2013-10-17 MED ORDER — CYANOCOBALAMIN 1000 MCG/ML IJ SOLN
1000.0000 ug | Freq: Once | INTRAMUSCULAR | Status: AC
  Administered 2013-10-17: 1000 ug via INTRAMUSCULAR

## 2013-10-17 NOTE — Addendum Note (Signed)
Addended by: Valerie Roys on: 10/17/2013 11:12 AM   Modules accepted: Orders

## 2013-10-17 NOTE — Assessment & Plan Note (Signed)
Follow-up with PCP, Dr. Nori Riis, concerning refill in current pain medication (Hydrocodone/Acetaminophen).  Xrays of lumbar spine ordered and patient instructed to go to [facility] or [place] for imaging.  No changes in medication at this time.

## 2013-10-17 NOTE — Assessment & Plan Note (Signed)
B12 1027mcg injection given today.

## 2013-10-17 NOTE — Progress Notes (Signed)
Subjective:     Patient ID: [name], female   DOB: Aug 07, 1945, 68 y.o.   MRN: [id number]  Cough  Back Pain   [name] is a 68yo white female presenting today for breathing difficulties.  When asked about her breathing problems, she stated that she has wheezing at night associated with her esphageal stricture.  Her main concern today was back pain, which she has had for two days.  The pain is bilateral, but slightly worse on her right side.  Pain is worse with sneezing or coughing.   She has not tried any medications to help her pain.  She denies any extension of her pain into her legs, numbness, tingling, or any changes in urination.  She is also out of her pain medication (Hydrocodone/Acetaminophen), which was last refilled on 09-29-2013.  She would also like a B12 shot today.  Review of Systems  Respiratory: Positive for cough.   Musculoskeletal: Positive for back pain.      Denies radiation of pain into legs, numbness, tingling, constipation, blood in urine, or changes in urination. Admits to cough and sneezing. Objective:   Physical Exam General:  Well nourished 68yo female in no apparent distress Cardiac:  S1 and S2 noted; no murmurs, rubs, or gallops Respiratory:  Clear bilaterally; no wheezes, rales, or rhonchi Abd: Bowel sounds noted, no tenderness to palpation Back:  No midline tenderness; No pain with palpation; patient stated mild CVA tenderness on right, but body language did not convey pain Extremities:  Mild tremor noted in hands bilaterally    Assessment:      See Problem List for Assesment Plan:     See Problem List for Plan

## 2013-10-17 NOTE — Patient Instructions (Signed)
Thank you so much for coming to visit me today concerning your back pain!  I would like for you to follow up with your PCP, Dr. Nori Riis, concerning your pain medication and knee pain.  I would also like to get some xray pictures of your lower back.  We will arrange for you to have your B12 shot before you leave today!  Thanks again for your visit and I hope you enjoy your trip this summer!

## 2013-10-19 ENCOUNTER — Telehealth: Payer: Self-pay | Admitting: Family Medicine

## 2013-10-19 NOTE — Telephone Encounter (Signed)
Pt called and would like [name] to call her. jw

## 2013-10-19 NOTE — Telephone Encounter (Signed)
LVM for patient to call back.[name]

## 2013-10-20 NOTE — Telephone Encounter (Signed)
LVM for patient to call back. ?

## 2013-10-28 ENCOUNTER — Telehealth: Payer: Self-pay | Admitting: Family Medicine

## 2013-10-28 MED ORDER — HYDROCODONE-ACETAMINOPHEN 5-325 MG PO TABS: ORAL_TABLET | ORAL | Status: DC

## 2013-10-28 NOTE — Telephone Encounter (Signed)
Pt called and needs a refill on her Hydrocodone left up front. Please call when ready. jw

## 2013-10-28 NOTE — Telephone Encounter (Signed)
Rx placed up front for pt pick up.  Derl Barrow, RN

## 2013-10-28 NOTE — Telephone Encounter (Signed)
T Coming to your physical desk top [facility]! [name]

## 2013-11-09 ENCOUNTER — Ambulatory Visit: Payer: Medicare HMO | Admitting: Family Medicine

## 2013-11-16 ENCOUNTER — Ambulatory Visit: Payer: Medicare HMO | Admitting: Family Medicine

## 2013-11-20 ENCOUNTER — Emergency Department (HOSPITAL_COMMUNITY)
Admission: EM | Admit: 2013-11-20 | Discharge: 2013-11-20 | Disposition: A | Discharge status: Home / Self Care | Payer: Commercial Managed Care - HMO | Source: Home / Self Care | Attending: Emergency Medicine | Admitting: Emergency Medicine

## 2013-11-20 ENCOUNTER — Encounter (HOSPITAL_COMMUNITY): Payer: Self-pay | Admitting: Emergency Medicine

## 2013-11-20 DIAGNOSIS — L519 Erythema multiforme, unspecified: Secondary | ICD-10-CM

## 2013-11-20 DIAGNOSIS — J069 Acute upper respiratory infection, unspecified: Secondary | ICD-10-CM

## 2013-11-20 DIAGNOSIS — B9789 Other viral agents as the cause of diseases classified elsewhere: Principal | ICD-10-CM

## 2013-11-20 LAB — POCT RAPID STREP A: Streptococcus, Group A Screen (Direct): NEGATIVE

## 2013-11-20 LAB — HIV ANTIBODY (ROUTINE TESTING W REFLEX): HIV 1&2 Ab, 4th Generation: NONREACTIVE

## 2013-11-20 MED ORDER — HYDROCORTISONE 2.5 % EX LOTN: TOPICAL_LOTION | Freq: Two times a day (BID) | CUTANEOUS | Status: DC | PRN

## 2013-11-20 MED ORDER — PREDNISONE 20 MG PO TABS: ORAL_TABLET | ORAL | Status: DC

## 2013-11-20 NOTE — ED Notes (Signed)
Pt has  Symptoms  Of  sorethroat  And  Rash  For  Several  Days

## 2013-11-20 NOTE — ED Notes (Addendum)
Medical history  Pulled   foreward   From  Prior   Record      No  New  Entry      Entered  By  This  Probation officer  On this  date   Pt  Reports        sorethroat  As  Well

## 2013-11-20 NOTE — ED Notes (Signed)
Pt  denys  Any  History  Of  hiv    Unclear  As  To  Why  It pulled  Up  From  Previous  History

## 2013-11-20 NOTE — ED Provider Notes (Signed)
CSN: [id number]     Arrival date & time 11/20/13  1457 History   First MD Initiated Contact with Patient 11/20/13 1510     Chief Complaint  Patient presents with  . Sore Throat   (Consider location/radiation/quality/duration/timing/severity/associated sxs/prior Treatment) HPI She is a 68 year old woman here today with her husband for evaluation of a rash. She states everyone in her household has had a cold for the last week. She has a cough and sore throat associated with this. Denies fevers, chills, rhinorrhea. Yesterday, she noted itchy red bumps come up on her back, arms, and legs. She's been applying hydrocortisone cream with some improvement. No else in the household has these lesions. She denies any new lotions, detergents. No known plant exposures.  She has HIV infection on her problem list. I asked her about this, and she states that she had a son die of HIV/AIDS many years ago. She denies any known personal history of HIV infection.  Past Medical History  Diagnosis Date  . Allergy   . Anxiety   . Arthritis   . Depression   . Diabetes mellitus   . Hyperlipidemia   . HIV infection     pt denies this diagnosis.   . Movement disorder   . Hypertension   . Scoliosis   . Esophageal stricture   . Osteoarthritis    Past Surgical History  Procedure Laterality Date  . Total knee arthroplasty      right  . Esophagogastric fundoplasty      some type "esoph surgery" per pt  . Colon surgery    . Abdominal hysterectomy     Family History  Problem Relation Age of Onset  . Cerebral palsy Son   . Heart disease Mother   . Diabetes Mother   . Heart disease Father    History  Substance Use Topics  . Smoking status: Never Smoker   . Smokeless tobacco: Never Used  . Alcohol Use: No   OB History   Grav Para Term Preterm Abortions TAB SAB Ect Mult Living                 Review of Systems  Constitutional: Negative.   HENT: Positive for sore throat. Negative for congestion,  rhinorrhea and trouble swallowing.   Respiratory: Positive for cough. Negative for shortness of breath.   Gastrointestinal: Negative.   Skin: Positive for rash.    Allergies  Ace inhibitors; Lisinopril; and Azilect  Home Medications   Prior to Admission medications   Medication Sig Start Date End Date Taking? Authorizing Provider  carbidopa-levodopa (SINEMET IR) 25-100 MG per tablet TAKE 1 AND 1/2 THREE TIMES DAILY 30 MINUTES BEFORE MEALS 02/23/13   Penni Bombard, MD  FLUoxetine (PROZAC) 20 MG capsule Take 20 mg by mouth daily.  12/16/11   Historical Provider, MD  HYDROcodone-acetaminophen (NORCO/VICODIN) 5-325 MG per tablet Take 1-2 tabs by mouth twice a day for knee pain 10/28/13   [name], MD  hydrocortisone 2.5 % lotion Apply topically 2 (two) times daily as needed. 11/20/13   Melony Overly, MD  metFORMIN (GLUCOPHAGE) 500 MG tablet Take 1 tablet (500 mg total) by mouth daily with breakfast. 01/26/13   [name], MD  omeprazole (PRILOSEC) 40 MG capsule  04/20/13   Historical Provider, MD  ONE TOUCH ULTRA TEST test strip USE AS DIRECTED 08/18/13   [name], MD  [facility] DELICA LANCETS 23F MISC USE DAILY AS DIRECTED FOR GLUCOSE TESTING 08/18/13  [name], MD  predniSONE (DELTASONE) 20 MG tablet Take 3 pills for 5 days, then 2 pills for 5 days, then 1 pill for 5 days. 11/20/13   Melony Overly, MD  propranolol ER (INDERAL LA) 80 MG 24 hr capsule Take 1 capsule (80 mg total) by mouth daily. 10/06/12   [name], MD  temazepam (RESTORIL) 15 MG capsule Take 15 mg by mouth at bedtime as needed. For sleep    Historical Provider, MD  zolpidem (AMBIEN) 10 MG tablet  05/24/13   Historical Provider, MD   BP 158/73  Pulse 101  Temp(Src) 98 F (36.7 C) (Oral)  SpO2 97% Physical Exam  Constitutional: She is oriented to person, place, and time. She appears well-developed and well-nourished. No distress.  HENT:  Head: Normocephalic and atraumatic.  Mouth/Throat: Oropharynx is clear and moist.   Neck: Normal range of motion. Neck supple.  Cardiovascular: Normal rate, regular rhythm and normal heart sounds.   No murmur heard. Pulmonary/Chest: Effort normal and breath sounds normal. No respiratory distress. She has no wheezes. She has no rales.  Lymphadenopathy:    She has no cervical adenopathy.  Neurological: She is alert and oriented to person, place, and time.  Skin: Skin is warm and dry. Rash (erythematous nodules on lower back, legs and arms; some with overlying excoriations.  No mucosal lesions.  No lesions on palms.) noted.    ED Course  Procedures (including critical care time) Labs Review Labs Reviewed  HIV ANTIBODY (ROUTINE TESTING)  POCT RAPID STREP A ([facility] URG CARE ONLY)    Imaging Review No results found.   MDM   1. Viral URI with cough   2. Erythema multiforme    Continue symptomatic treatment for her viral URI. Rash is most consistent with erythema multiforme, although not classic for this. Will treat with prednisone taper, 60 mg x5 days, 40 mg x5 days, 20 mg x5 days. If not improving by Wednesday, please call your primary care physician for an appointment. Followup at urgent care if things are worsening.  There is no documented HIV antibody in the computer system. We have drawn this lab today.    Melony Overly, MD 11/20/13 [phone]

## 2013-11-20 NOTE — Discharge Instructions (Signed)
Your body is likely having a reaction to the virus causing your cold. Take prednisone 3 pills for 5 days, then 2 pills for 5 days, then 1 pill for 5 days. Apply the hydrocortisone lotion twice a day as needed for itching.  We checked an HIV test.  We will call if it is positive.  Dr. Nori Riis will also be able to see the results of this test. Follow up with your regular doctor if you are not improving by Wednesday.

## 2013-11-22 ENCOUNTER — Telehealth: Payer: Self-pay | Admitting: Family Medicine

## 2013-11-22 LAB — CULTURE, GROUP A STREP

## 2013-11-22 NOTE — Telephone Encounter (Signed)
LVM for patient to call back to discuss below 

## 2013-11-22 NOTE — Telephone Encounter (Signed)
Pt went to urgent care yesterday and she needs to pass along some information about her visit

## 2013-11-25 ENCOUNTER — Telehealth: Payer: Self-pay | Admitting: Family Medicine

## 2013-11-25 NOTE — Telephone Encounter (Signed)
Given that Dr. Nori Riis will be back next week, I will let her provide this refill.  I am not sure with the knee replacement if she plans long term narcotics for this patient.

## 2013-11-25 NOTE — Telephone Encounter (Signed)
Pt called and would like a refill on her pain medication left up front for next week. jw

## 2013-11-28 ENCOUNTER — Other Ambulatory Visit: Payer: Self-pay | Admitting: *Deleted

## 2013-11-29 MED ORDER — HYDROCODONE-ACETAMINOPHEN 5-325 MG PO TABS: ORAL_TABLET | ORAL | Status: DC

## 2013-11-29 MED ORDER — HYDROCORTISONE 2.5 % EX LOTN: TOPICAL_LOTION | Freq: Two times a day (BID) | CUTANEOUS | Status: DC | PRN

## 2013-11-29 NOTE — Telephone Encounter (Signed)
Dear [name] Team This is up front [facility]! [name]

## 2013-11-29 NOTE — Telephone Encounter (Signed)
Spoke with patient and informed her of below 

## 2013-12-05 ENCOUNTER — Other Ambulatory Visit: Payer: Self-pay | Admitting: *Deleted

## 2013-12-05 MED ORDER — HYDROCORTISONE 2.5 % EX LOTN: TOPICAL_LOTION | Freq: Two times a day (BID) | CUTANEOUS | Status: DC | PRN

## 2013-12-07 ENCOUNTER — Ambulatory Visit: Payer: Medicare HMO | Admitting: Family Medicine

## 2013-12-08 ENCOUNTER — Telehealth: Payer: Self-pay | Admitting: Family Medicine

## 2013-12-08 ENCOUNTER — Encounter: Payer: Self-pay | Admitting: Family Medicine

## 2013-12-08 ENCOUNTER — Other Ambulatory Visit: Payer: Self-pay | Admitting: *Deleted

## 2013-12-08 ENCOUNTER — Ambulatory Visit (INDEPENDENT_AMBULATORY_CARE_PROVIDER_SITE_OTHER): Payer: Commercial Managed Care - HMO | Admitting: Family Medicine

## 2013-12-08 VITALS — BP 144/92 | HR 106 | Temp 97.9°F | Ht 63.0 in | Wt 186.0 lb

## 2013-12-08 DIAGNOSIS — R21 Rash and other nonspecific skin eruption: Secondary | ICD-10-CM

## 2013-12-08 NOTE — Progress Notes (Signed)
    Subjective   [name] is a 68 y.o. female that presents for a same day visit  1. Rash: Rash started two weeks ago but resolved. Returned last about 5 days ago. Itching. Started on back. Itching on arms and legs. Tried hydrocortisone cream OTC and Benadryl which have not helped. She reports sore throat, cough, sneezing with no fever. Husband also had similar rash that resolved spontaneously. Son has a cough.  History  Substance Use Topics  . Smoking status: Never Smoker   . Smokeless tobacco: Never Used  . Alcohol Use: No    ROS Per HPI  Objective   BP 144/92  Pulse 106  Temp(Src) 97.9 F (36.6 C) (Oral)  Ht 5\' 3"  (1.6 m)  Wt 186 lb (84.369 kg)  BMI 32.96 kg/m2  SpO2 97%  General: Well appearing. Has resting tremor. Expressive language disorder HEENT: no oral lesions Skin: multiple patchy lesions on right arm. Flat, non-tender and diffusely pink.  Assessment and Plan   Please refer to problem based charting of assessment and plan

## 2013-12-08 NOTE — Patient Instructions (Signed)
Thank you for coming to see me today. It was a pleasure. Today we talked about:   Rash: You recently had some Hydrocortisone 2.5% called into your pharmacy. Please use this for your itching. If your rash does not improve within a week, please return to be evaluated.  If you have any questions or concerns, please do not hesitate to call the office at [phone].  Sincerely,  Cordelia Poche, MD

## 2013-12-08 NOTE — Telephone Encounter (Signed)
Pt called and would like to speak to a nurse for Dr. Nori Riis. She didn't want to tell me the reason for her call. Jw

## 2013-12-08 NOTE — Telephone Encounter (Signed)
Tried calling back number provided, was told I had the wrong number. Also tried calling mobile number no answer no voicemail. Will await callback from patient.

## 2013-12-09 DIAGNOSIS — R21 Rash and other nonspecific skin eruption: Secondary | ICD-10-CM | POA: Insufficient documentation

## 2013-12-09 NOTE — Assessment & Plan Note (Signed)
Patient is s/p steroid taper for similar rash earlier this month. Currently just has itching. Could be viral exanthem with patient's URI symptoms. Patient has hydrocortisone 2.5% already called in. Has not picked up prescription yet. Will have patient use cream to help with itching. Discussed return precautions. Has appointment with PCP soon.

## 2013-12-10 ENCOUNTER — Inpatient Hospital Stay (HOSPITAL_COMMUNITY)
Admission: EM | Admit: 2013-12-10 | Discharge: 2013-12-13 | DRG: 884 | Disposition: A | Discharge status: Home Health | Payer: Medicare HMO | Attending: Family Medicine | Admitting: Family Medicine

## 2013-12-10 ENCOUNTER — Emergency Department (HOSPITAL_COMMUNITY): Payer: Medicare HMO

## 2013-12-10 ENCOUNTER — Encounter (HOSPITAL_COMMUNITY): Payer: Self-pay | Admitting: Emergency Medicine

## 2013-12-10 DIAGNOSIS — F22 Delusional disorders: Secondary | ICD-10-CM | POA: Diagnosis present

## 2013-12-10 DIAGNOSIS — F1910 Other psychoactive substance abuse, uncomplicated: Secondary | ICD-10-CM | POA: Diagnosis present

## 2013-12-10 DIAGNOSIS — E785 Hyperlipidemia, unspecified: Secondary | ICD-10-CM | POA: Diagnosis present

## 2013-12-10 DIAGNOSIS — R32 Unspecified urinary incontinence: Secondary | ICD-10-CM | POA: Diagnosis present

## 2013-12-10 DIAGNOSIS — E119 Type 2 diabetes mellitus without complications: Secondary | ICD-10-CM | POA: Diagnosis present

## 2013-12-10 DIAGNOSIS — M1990 Unspecified osteoarthritis, unspecified site: Secondary | ICD-10-CM | POA: Diagnosis present

## 2013-12-10 DIAGNOSIS — R410 Disorientation, unspecified: Secondary | ICD-10-CM

## 2013-12-10 DIAGNOSIS — K222 Esophageal obstruction: Secondary | ICD-10-CM | POA: Diagnosis present

## 2013-12-10 DIAGNOSIS — F339 Major depressive disorder, recurrent, unspecified: Secondary | ICD-10-CM | POA: Diagnosis present

## 2013-12-10 DIAGNOSIS — Z765 Malingerer [conscious simulation]: Secondary | ICD-10-CM | POA: Diagnosis not present

## 2013-12-10 DIAGNOSIS — R159 Full incontinence of feces: Secondary | ICD-10-CM | POA: Diagnosis present

## 2013-12-10 DIAGNOSIS — F0390 Unspecified dementia without behavioral disturbance: Secondary | ICD-10-CM | POA: Diagnosis present

## 2013-12-10 DIAGNOSIS — I10 Essential (primary) hypertension: Secondary | ICD-10-CM | POA: Diagnosis present

## 2013-12-10 DIAGNOSIS — F09 Unspecified mental disorder due to known physiological condition: Principal | ICD-10-CM | POA: Diagnosis present

## 2013-12-10 DIAGNOSIS — R0789 Other chest pain: Secondary | ICD-10-CM

## 2013-12-10 DIAGNOSIS — R079 Chest pain, unspecified: Secondary | ICD-10-CM | POA: Diagnosis present

## 2013-12-10 DIAGNOSIS — N390 Urinary tract infection, site not specified: Secondary | ICD-10-CM | POA: Diagnosis present

## 2013-12-10 DIAGNOSIS — IMO0002 Reserved for concepts with insufficient information to code with codable children: Secondary | ICD-10-CM | POA: Diagnosis not present

## 2013-12-10 DIAGNOSIS — G20 Parkinson's disease: Secondary | ICD-10-CM

## 2013-12-10 DIAGNOSIS — R4189 Other symptoms and signs involving cognitive functions and awareness: Secondary | ICD-10-CM

## 2013-12-10 DIAGNOSIS — R404 Transient alteration of awareness: Secondary | ICD-10-CM | POA: Diagnosis not present

## 2013-12-10 DIAGNOSIS — Z96659 Presence of unspecified artificial knee joint: Secondary | ICD-10-CM

## 2013-12-10 DIAGNOSIS — G9340 Encephalopathy, unspecified: Secondary | ICD-10-CM

## 2013-12-10 DIAGNOSIS — G20A1 Parkinson's disease without dyskinesia, without mention of fluctuations: Secondary | ICD-10-CM

## 2013-12-10 HISTORY — DX: Other symptoms and signs involving cognitive functions and awareness: R41.89

## 2013-12-10 LAB — URINALYSIS, ROUTINE W REFLEX MICROSCOPIC
Glucose, UA: NEGATIVE mg/dL
Ketones, ur: 15 mg/dL — AB
Nitrite: POSITIVE — AB
Protein, ur: 30 mg/dL — AB
Specific Gravity, Urine: 1.023 (ref 1.005–1.030)
Urobilinogen, UA: 1 mg/dL (ref 0.0–1.0)
pH: 5.5 (ref 5.0–8.0)

## 2013-12-10 LAB — COMPREHENSIVE METABOLIC PANEL
ALT: <5 U/L (ref 0–35)
AST: 16 U/L (ref 0–37)
Albumin: 3.4 g/dL — ABNORMAL LOW (ref 3.5–5.2)
Alkaline Phosphatase: 77 U/L (ref 39–117)
Anion gap: 16 — ABNORMAL HIGH (ref 5–15)
BUN: 15 mg/dL (ref 6–23)
CO2: 22 mEq/L (ref 19–32)
Calcium: 9.1 mg/dL (ref 8.4–10.5)
Chloride: 99 mEq/L (ref 96–112)
Creatinine, Ser: 1.1 mg/dL (ref 0.50–1.10)
GFR calc Af Amer: 58 mL/min — ABNORMAL LOW (ref >90)
GFR calc non Af Amer: 50 mL/min — ABNORMAL LOW (ref >90)
Glucose, Bld: 127 mg/dL — ABNORMAL HIGH (ref 70–99)
Potassium: 3.6 mEq/L — ABNORMAL LOW (ref 3.7–5.3)
Sodium: 137 mEq/L (ref 137–147)
Total Bilirubin: 0.8 mg/dL (ref 0.3–1.2)
Total Protein: 7.4 g/dL (ref 6.0–8.3)

## 2013-12-10 LAB — URINE MICROSCOPIC-ADD ON

## 2013-12-10 LAB — CBC
HCT: 37.8 % (ref 36.0–46.0)
HCT: 36.4 % (ref 36.0–46.0)
Hemoglobin: 12.8 g/dL (ref 12.0–15.0)
Hemoglobin: 12.3 g/dL (ref 12.0–15.0)
MCH: 31.2 pg (ref 26.0–34.0)
MCH: 31.9 pg (ref 26.0–34.0)
MCHC: 33.9 g/dL (ref 30.0–36.0)
MCHC: 33.8 g/dL (ref 30.0–36.0)
MCV: 92.2 fL (ref 78.0–100.0)
MCV: 94.3 fL (ref 78.0–100.0)
Platelets: 327 10*3/uL (ref 150–400)
Platelets: 268 10*3/uL (ref 150–400)
RBC: 4.1 MIL/uL (ref 3.87–5.11)
RBC: 3.86 MIL/uL — ABNORMAL LOW (ref 3.87–5.11)
RDW: 12.6 % (ref 11.5–15.5)
RDW: 12.5 % (ref 11.5–15.5)
WBC: 10.7 10*3/uL — ABNORMAL HIGH (ref 4.0–10.5)
WBC: 9.6 10*3/uL (ref 4.0–10.5)

## 2013-12-10 LAB — DIFFERENTIAL
Basophils Absolute: 0 10*3/uL (ref 0.0–0.1)
Basophils Relative: 0 % (ref 0–1)
Eosinophils Absolute: 0.1 10*3/uL (ref 0.0–0.7)
Eosinophils Relative: 1 % (ref 0–5)
Lymphocytes Relative: 19 % (ref 12–46)
Lymphs Abs: 2 10*3/uL (ref 0.7–4.0)
Monocytes Absolute: 0.9 10*3/uL (ref 0.1–1.0)
Monocytes Relative: 8 % (ref 3–12)
Neutro Abs: 7.7 10*3/uL (ref 1.7–7.7)
Neutrophils Relative %: 72 % (ref 43–77)

## 2013-12-10 LAB — RAPID URINE DRUG SCREEN, HOSP PERFORMED
Amphetamines: NOT DETECTED
Barbiturates: NOT DETECTED
Benzodiazepines: POSITIVE — AB
Cocaine: NOT DETECTED
Opiates: NOT DETECTED
Tetrahydrocannabinol: NOT DETECTED

## 2013-12-10 LAB — ETHANOL: Alcohol, Ethyl (B): <11 mg/dL (ref 0–11)

## 2013-12-10 LAB — I-STAT TROPONIN, ED: Troponin i, poc: 0.05 ng/mL (ref 0.00–0.08)

## 2013-12-10 LAB — CREATININE, SERUM
Creatinine, Ser: 1.11 mg/dL — ABNORMAL HIGH (ref 0.50–1.10)
GFR calc Af Amer: 58 mL/min — ABNORMAL LOW (ref >90)
GFR calc non Af Amer: 50 mL/min — ABNORMAL LOW (ref >90)

## 2013-12-10 LAB — GLUCOSE, CAPILLARY: Glucose-Capillary: 146 mg/dL — ABNORMAL HIGH (ref 70–99)

## 2013-12-10 LAB — TROPONIN I: Troponin I: <0.3 ng/mL (ref <0.30)

## 2013-12-10 LAB — ACETAMINOPHEN LEVEL: Acetaminophen (Tylenol), Serum: <15 ug/mL (ref 10–30)

## 2013-12-10 LAB — SALICYLATE LEVEL: Salicylate Lvl: <2 mg/dL — ABNORMAL LOW (ref 2.8–20.0)

## 2013-12-10 LAB — APTT: aPTT: 36 seconds (ref 24–37)

## 2013-12-10 LAB — PROTIME-INR
INR: 0.97 (ref 0.00–1.49)
Prothrombin Time: 12.9 seconds (ref 11.6–15.2)

## 2013-12-10 MED ORDER — PROPRANOLOL HCL ER 80 MG PO CP24
80.0000 mg | ORAL_CAPSULE | Freq: Every day | ORAL | Status: DC
  Administered 2013-12-11 – 2013-12-13 (×3): 80 mg via ORAL
  Filled 2013-12-10 (×3): qty 1

## 2013-12-10 MED ORDER — LORAZEPAM 2 MG/ML IJ SOLN: 1.0000 mg | Freq: Four times a day (QID) | INTRAMUSCULAR | Status: DC | PRN

## 2013-12-10 MED ORDER — DEXTROSE 5 % IV SOLN
1.0000 g | Freq: Once | INTRAVENOUS | Status: AC
  Administered 2013-12-10: 1 g via INTRAVENOUS
  Filled 2013-12-10: qty 10

## 2013-12-10 MED ORDER — LORAZEPAM 1 MG PO TABS
1.0000 mg | ORAL_TABLET | Freq: Four times a day (QID) | ORAL | Status: DC | PRN
  Administered 2013-12-12 (×2): 1 mg via ORAL
  Filled 2013-12-10: qty 1

## 2013-12-10 MED ORDER — PANTOPRAZOLE SODIUM 40 MG PO TBEC
40.0000 mg | DELAYED_RELEASE_TABLET | Freq: Every day | ORAL | Status: DC
  Administered 2013-12-11 – 2013-12-13 (×3): 40 mg via ORAL
  Filled 2013-12-10 (×3): qty 1

## 2013-12-10 MED ORDER — INSULIN ASPART 100 UNIT/ML ~~LOC~~ SOLN
0.0000 [IU] | Freq: Three times a day (TID) | SUBCUTANEOUS | Status: DC
  Administered 2013-12-11 – 2013-12-12 (×3): 1 [IU] via SUBCUTANEOUS

## 2013-12-10 MED ORDER — LORAZEPAM 1 MG PO TABS
0.0000 mg | ORAL_TABLET | Freq: Four times a day (QID) | ORAL | Status: AC
  Administered 2013-12-10: 1 mg via ORAL
  Administered 2013-12-11: 2 mg via ORAL
  Administered 2013-12-11: 1 mg via ORAL
  Administered 2013-12-11: 2 mg via ORAL
  Filled 2013-12-10 (×2): qty 1
  Filled 2013-12-10: qty 2
  Filled 2013-12-10: qty 1
  Filled 2013-12-10 (×2): qty 2

## 2013-12-10 MED ORDER — FOLIC ACID 1 MG PO TABS
1.0000 mg | ORAL_TABLET | Freq: Every day | ORAL | Status: DC
  Administered 2013-12-10 – 2013-12-13 (×4): 1 mg via ORAL
  Filled 2013-12-10 (×4): qty 1

## 2013-12-10 MED ORDER — FLUOXETINE HCL 40 MG PO CAPS: 40.0000 mg | ORAL_CAPSULE | Freq: Every day | ORAL | Status: DC

## 2013-12-10 MED ORDER — ACETAMINOPHEN 325 MG PO TABS: 650.0000 mg | ORAL_TABLET | ORAL | Status: DC | PRN

## 2013-12-10 MED ORDER — INSULIN ASPART 100 UNIT/ML ~~LOC~~ SOLN
3.0000 [IU] | Freq: Three times a day (TID) | SUBCUTANEOUS | Status: DC
  Administered 2013-12-11 – 2013-12-13 (×4): 3 [IU] via SUBCUTANEOUS

## 2013-12-10 MED ORDER — HEPARIN SODIUM (PORCINE) 5000 UNIT/ML IJ SOLN
5000.0000 [IU] | Freq: Three times a day (TID) | INTRAMUSCULAR | Status: DC
  Administered 2013-12-10 – 2013-12-13 (×7): 5000 [IU] via SUBCUTANEOUS
  Filled 2013-12-10 (×9): qty 1

## 2013-12-10 MED ORDER — LORAZEPAM 1 MG PO TABS: 0.0000 mg | ORAL_TABLET | Freq: Two times a day (BID) | ORAL | Status: DC

## 2013-12-10 MED ORDER — ONDANSETRON HCL 4 MG/2ML IJ SOLN: 4.0000 mg | Freq: Four times a day (QID) | INTRAMUSCULAR | Status: DC | PRN

## 2013-12-10 MED ORDER — FLUOXETINE HCL 20 MG PO CAPS
40.0000 mg | ORAL_CAPSULE | Freq: Every day | ORAL | Status: DC
  Administered 2013-12-11 – 2013-12-13 (×3): 40 mg via ORAL
  Filled 2013-12-10 (×3): qty 2

## 2013-12-10 MED ORDER — DEXTROSE 5 % IV SOLN
1.0000 g | INTRAVENOUS | Status: DC
  Administered 2013-12-11 – 2013-12-12 (×2): 1 g via INTRAVENOUS
  Filled 2013-12-10 (×4): qty 10

## 2013-12-10 MED ORDER — ADULT MULTIVITAMIN W/MINERALS CH
1.0000 | ORAL_TABLET | Freq: Every day | ORAL | Status: DC
  Administered 2013-12-10 – 2013-12-13 (×4): 1 via ORAL
  Filled 2013-12-10 (×4): qty 1

## 2013-12-10 MED ORDER — LORAZEPAM 0.5 MG PO TABS: 0.5000 mg | ORAL_TABLET | Freq: Once | ORAL | Status: DC

## 2013-12-10 MED ORDER — VITAMIN B-1 100 MG PO TABS
100.0000 mg | ORAL_TABLET | Freq: Every day | ORAL | Status: DC
  Administered 2013-12-10 – 2013-12-13 (×4): 100 mg via ORAL
  Filled 2013-12-10 (×4): qty 1

## 2013-12-10 MED ORDER — THIAMINE HCL 100 MG/ML IJ SOLN
100.0000 mg | Freq: Every day | INTRAMUSCULAR | Status: DC
  Filled 2013-12-10 (×4): qty 1

## 2013-12-10 MED ORDER — HYDROCORTISONE 2.5 % EX LOTN: TOPICAL_LOTION | Freq: Two times a day (BID) | CUTANEOUS | Status: DC | PRN

## 2013-12-10 MED ORDER — CARBIDOPA-LEVODOPA 25-100 MG PO TABS
1.5000 | ORAL_TABLET | Freq: Three times a day (TID) | ORAL | Status: DC
  Administered 2013-12-10 – 2013-12-13 (×8): 1.5 via ORAL
  Filled 2013-12-10 (×10): qty 1.5

## 2013-12-10 NOTE — ED Notes (Signed)
Dr. Wickline at bedside.  

## 2013-12-10 NOTE — ED Notes (Signed)
Daughter stated, She's not been in her normal state in the last 2 months. She saying things that are not real.  Asking about her mother.  Everything confused, unable to take care of herself. She takes too much of the medication.

## 2013-12-10 NOTE — H&P (Signed)
[facility] Admission History and Physical Service Pager: [phone]  [name] Medical record number: [id number] Date of birth: 1945/11/27 Age: 68 y.o. Gender: female  Primary Care Provider: [name], MD Consultants: None Code Status: Full Code  Chief Complaint: Ongoing chest pain, and altered mental status.   Assessment and Plan: [name] is a 68 y.o. female presenting with chest pain since last night that has occurred intermittently, feels like a "heaviness", is nonradiating, and not associated with SOB. Additionally, pt. has had Altered mental status, bowel and bladder incontinence, and hallucinations as well as drug seeking / odd behavior noted by her family members. PMH is significant for DMII, MDD, Esophageal Stricture, HLD, Parkinson's Disease, HTN, Anxiety.    1. Chest Pain rule out ACS:  Vital signs stable. Chest pain appears to be paroxysmal, centrally located, "heavy" in nature, nonradiating, not associated with nausea, arm pain, or neck pain. Initial workup revealed EKG without acute changes, Troponin WNL, and otherwise stable.  - Admitted to telemetry under Dr. McDiarmid - Trend troponins - HEART score 3, ASCVD risk pending lipid panel.  - AM EKG - Vital signs per unit.  - Nitroglycerin prn chest pain.   2. Altered Mental Status: Patient with multiple contributing factors to her AMS and hallucinations. Complaints of hallucinations with ambien at home, as well as family members reporting evidence of taking highly sedative / mind altering medications that are not prescribed to her including vistaril, vicadin, and others. Additionally, she has had increased frequency, and urgency with urination in the setting of U/A positive for bacterial infection. Likely contributing to her altered mental status. Pt. Also has parkinson's disease giving her an additional component of cognitive dysfunction, and is taking sinemet which may  cause cognitive disturbance as well.  - Continue to monitor mental status - Continue sinemet while holding other mind altering medications.  - No haldol for agitation. Recommend Seroquel for self-endangering behavior.  - Will adjust medical therapy as indicated.  - given UDS + for benzos will place on CIWA protocol for potential withdrawal  3. UTI: UA with nitrites and leuks. S/p CTX in ED. - UCx added this morning - Treating UTI with Ceftriaxone 1g qd.   4. Parkinson's Disease:  Pt. States that her baseline level of functioning is fair. She does state that she has had an increased number of falls lately due to "tripping over her feet".  - Continue sinemet at current dosing  - PT evaluation for current dispo.   5. Major Depression / Anxiety:  - Continue Prozac 40mg  qd.  - Not on prn anxiety medication. Will continue to monitor her mental status.   6. HTN - Continue propranolol 80mg  qd.   7. DMII - Last A1C 6.0, on Metformin - Carb Modified diet - Sensitive SSI - A1C pending - hold home metformin   FEN/GI: Carb Modified Diet / pantoprazole Prophylaxis: Sub-Q heparin  Disposition: Admit to med-surg for observation. Discharge pending cardiac rule out and clearing of mental status.  History of Present Illness: [name] is a 68 y.o. female with a history of parkinsons, substance abuse, DM, HLD, HTN, depression, presenting with chest pain in the center of her chest that feels like pressure. It is localized without radiation, she denies diaphoresis, SOB, nausea, arm pain, neck pain,or jaw pain. She has had chest pain previously that is "just like this pain" that is "off and on." She has also had a cough x a few  weeks after being in contact with her husband and son who have had URI's recently.   Additionally, the patient complains of hallucinations with taking ambien. She says that she has not had any hallucinations today, and that she remembers all of the events today. Her  husband mentions that she was "asking for her mother" yesterday afternoon. She also says that she fell yesterday evening, and that she has been falling somewhat frequently lately. She says that she "trips over her feet" and falls down. She does say that she has had some urinary urgency, and frequency. She denies dysuria. She has had some bowel incontinence. She is able to ambulate well at home without weakness or difficulty moving around per her.   After interviewing the patient, the husband spoke with [place] outside of the room. He and his daughter provided additional history. He stated he has had a hard time with the patient recently. She has been hallucinating. They also report that she has been going in to their roommates room and going through his medications and taking some of them. The daughter showed [place] a picture on her phone of vistaril bottles that the patient had been accessing. They also report the patient has vicodin at home, in addition to her [place]. They do not endorse any benzos in the house, though do not know all the medications the roommate has.   In the ED the patient had a UDS positive for benzos. UA with positive nitrites and leukocytes. CXR with no acute changes. CT head with chronic microvascular disease and no acute changes. Etoh, tylenol, and salicylate levels were normal.  Review Of Systems: Per HPI with the following additions: None Otherwise 12 point review of systems was performed and was unremarkable.  Patient Active Problem List   Diagnosis Date Noted  . Rash 12/09/2013  . B12 deficiency 10/17/2013  . Dysphagia, unspecified(787.20) 06/02/2013  . Metatarsal fracture 04/19/2013  . History of recent fall 03/15/2013  . Parkinson's disease 01/18/2013  . S/P TKR (total knee replacement) 06/09/2012  . Episodic substance abuse 09/20/2010  . Neoplasm of uncertain behavior 44/04/270  . Esophageal abnormality 08/12/2010  . FATIGUE 01/16/2010  . ARTHRITIS, BACK 03/28/2009  .  SCOLIOSIS 03/28/2009  . ROTATOR CUFF SYNDROME 01/24/2009  . LENTIGO 11/30/2008  . CARPAL TUNNEL SYNDROME, LEFT 01/05/2008  . NEOPLASM UNSPEC NATURE OTHER [place] SITES 08/18/2007  . DIABETES MELLITUS, TYPE II 08/05/2006  . HYPERLIPIDEMIA 08/05/2006  . HYPERTENSION 08/05/2006  . OSTEOARTHRITIS, KNEE 08/04/2006  . DEPRESSION, MAJOR, RECURRENT 06/11/2006   Past Medical History: Past Medical History  Diagnosis Date  . Allergy   . Anxiety   . Arthritis   . Depression   . Diabetes mellitus   . Hyperlipidemia   . HIV infection     pt denies this diagnosis.   . Movement disorder   . Hypertension   . Scoliosis   . Esophageal stricture   . Osteoarthritis    Past Surgical History: Past Surgical History  Procedure Laterality Date  . Total knee arthroplasty      right  . Esophagogastric fundoplasty      some type "esoph surgery" per pt  . Colon surgery    . Abdominal hysterectomy     Social History: History  Substance Use Topics  . Smoking status: Never Smoker   . Smokeless tobacco: Never Used  . Alcohol Use: No   Additional social history: None Please also refer to relevant sections of EMR.  Family History: Family History  Problem  Relation Age of Onset  . Cerebral palsy Son   . Heart disease Mother   . Diabetes Mother   . Heart disease Father    Allergies and Medications: Allergies  Allergen Reactions  . Ace Inhibitors Anaphylaxis and Swelling  . Lisinopril Swelling    Severe facial angioedema requiring hospitalization 2007 (approx)  . Azilect [Rasagiline Mesylate] Other (See Comments)    hypotension   No current facility-administered medications on file prior to encounter.   Current Outpatient Prescriptions on File Prior to Encounter  Medication Sig Dispense Refill  . hydrocortisone 2.5 % lotion Apply topically 2 (two) times daily as needed.  59 mL  0  . omeprazole (PRILOSEC) 40 MG capsule Take 40 mg by mouth daily.       . propranolol ER (INDERAL LA) 80 MG 24  hr capsule Take 1 capsule (80 mg total) by mouth daily.  90 capsule  3  . zolpidem (AMBIEN) 10 MG tablet Take 10 mg by mouth at bedtime.         Objective: BP 129/99  Temp(Src) 98.7 F (37.1 C) (Oral)  Ht 5\' 4"  (1.626 m)  Wt 183 lb (83.008 kg)  BMI 31.40 kg/m2  SpO2 94% Exam: General: NAD, AAOx3 HEENT: NCAT, PERRLA Cardiovascular: RRR, No MGR Respiratory: CTA Bilaterally Abdomen: Soft, Nontender, no suprapubic tenderness, +BS Extremities: WWP, 2+ Distal pulses bilaterally Skin: No evidence of rashes, or other abnormalities Neuro: AAOx3, CNII-XII grossly intact, 5/5 upper and lower extremity motor, full sensation in all extremities.   Labs and Imaging: CBC BMET   Recent Labs Lab 12/10/13 1549  WBC 10.7*  HGB 12.8  HCT 37.8  PLT 327    Recent Labs Lab 12/10/13 1549  NA 137  K 3.6*  CL 99  CO2 22  BUN 15  CREATININE 1.10  GLUCOSE 127*  CALCIUM 9.1     Glucose - 146 UDS - + for benzodiazepines.  Troponin - 0.05 BAL - <11  Urinalysis    Component Value Date/Time   COLORURINE AMBER* 12/10/2013 1600   APPEARANCEUR TURBID* 12/10/2013 1600   LABSPEC 1.023 12/10/2013 1600   PHURINE 5.5 12/10/2013 1600   GLUCOSEU NEGATIVE 12/10/2013 1600   HGBUR MODERATE* 12/10/2013 1600   HGBUR trace-lysed 06/28/2008 0900   BILIRUBINUR SMALL* 12/10/2013 1600   KETONESUR 15* 12/10/2013 1600   PROTEINUR 30* 12/10/2013 1600   UROBILINOGEN 1.0 12/10/2013 1600   NITRITE POSITIVE* 12/10/2013 1600   LEUKOCYTESUR LARGE* 12/10/2013 1600       Dg Chest 2 View  12/10/2013   CLINICAL DATA:  Cough  EXAM: CHEST  2 VIEW  COMPARISON:  03/11/2012  FINDINGS: Heart is upper limits normal in size. No confluent opacities, effusions or edema. No acute bony abnormality.  IMPRESSION: No active cardiopulmonary disease.   Electronically Signed   By: Rolm Baptise M.D.   On: 12/10/2013 17:41   Ct Head Wo Contrast  12/10/2013   CLINICAL DATA:  Delusions and dementia.  EXAM: CT HEAD WITHOUT CONTRAST   TECHNIQUE: Contiguous axial images were obtained from the base of the skull through the vertex without intravenous contrast.  COMPARISON:  Head CT 10/07/2007.  FINDINGS: Mild cerebral atrophy. Patchy areas of decreased attenuation are noted throughout the deep and periventricular white matter of the cerebral hemispheres bilaterally, compatible with mild chronic microvascular ischemic disease. No acute intracranial abnormalities. Specifically, no evidence of acute intracranial hemorrhage, no definite findings of acute/subacute cerebral ischemia, no mass, mass effect, hydrocephalus or abnormal intra or extra-axial fluid  collections. Visualized paranasal sinuses and mastoids are well pneumatized. No acute displaced skull fractures are identified.  IMPRESSION: 1. No acute intracranial abnormalities. 2. Mild cerebral atrophy with mild chronic microvascular ischemic changes in the cerebral white matter, as above.   Electronically Signed   By: Vinnie Langton M.D.   On: 12/10/2013 16:41      Aquilla Hacker, MD 12/10/2013, 5:30 PM PGY-1, [place] pager: [phone], text pages welcome  Upper Level Addendum:  I have seen and evaluated this patient along with Dr. Minda Ditto and reviewed the above note, making necessary revisions in red.   Tommi Rumps, MD Family Medicine PGY-3

## 2013-12-10 NOTE — ED Notes (Signed)
Admitting physician at bedside

## 2013-12-10 NOTE — ED Provider Notes (Signed)
CSN: [id number]     Arrival date & time 12/10/13  1446 History   First MD Initiated Contact with Patient 12/10/13 1526     Chief Complaint  Patient presents with  . Delusional  . Dementia      Patient is a 68 y.o. female presenting with altered mental status. The history is provided by the patient and a relative.  Altered Mental Status Presenting symptoms: behavior changes   Severity:  Moderate Duration:  2 months Timing:  Constant Progression:  Worsening Chronicity:  Recurrent Associated symptoms: bladder incontinence, hallucinations, rash and vomiting   Associated symptoms: no abdominal pain and no fever   Pt presents from home with daughter Pt has h/o dementia and daughter reports over past 2 months she has had increasing behavior changes.  She feels it is has worsened in past week.  She reports she is hallucinating (Sees her dead mother)  She has also had increased confusion.  There is also a concern that she is taking too much of her medication Lorrin Mais)   She has had vomiting She also has hard time getting to bathroom and has had urinary/fecal incontinence over past week She recently fell out of bed She lives with husband and has hard time taking care of her.    Past Medical History  Diagnosis Date  . Allergy   . Anxiety   . Arthritis   . Depression   . Diabetes mellitus   . Hyperlipidemia   . HIV infection     pt denies this diagnosis.   . Movement disorder   . Hypertension   . Scoliosis   . Esophageal stricture   . Osteoarthritis    Past Surgical History  Procedure Laterality Date  . Total knee arthroplasty      right  . Esophagogastric fundoplasty      some type "esoph surgery" per pt  . Colon surgery    . Abdominal hysterectomy     Family History  Problem Relation Age of Onset  . Cerebral palsy Son   . Heart disease Mother   . Diabetes Mother   . Heart disease Father    History  Substance Use Topics  . Smoking status: Never Smoker   . Smokeless  tobacco: Never Used  . Alcohol Use: No   OB History   Grav Para Term Preterm Abortions TAB SAB Ect Mult Living                 Review of Systems  Unable to perform ROS: Mental status change  Constitutional: Negative for fever.  Respiratory: Positive for cough.   Gastrointestinal: Positive for vomiting. Negative for abdominal pain.  Genitourinary: Positive for bladder incontinence.  Skin: Positive for rash.  Psychiatric/Behavioral: Positive for hallucinations.      Allergies  Ace inhibitors; Lisinopril; and Azilect  Home Medications   Prior to Admission medications   Medication Sig Start Date End Date Taking? Authorizing Provider  carbidopa-levodopa (SINEMET) 25-100 MG per tablet Take 1.5 tablets by mouth 3 (three) times daily.   Yes Historical Provider, MD  FLUoxetine (PROZAC) 40 MG capsule Take 40 mg by mouth daily.   Yes Historical Provider, MD  gabapentin (NEURONTIN) 300 MG capsule Take 300 mg by mouth 3 (three) times daily. 10/13/13  Yes Historical Provider, MD  HYDROcodone-acetaminophen (NORCO/VICODIN) 5-325 MG per tablet Take 1-2 tablets by mouth 2 (two) times daily.   Yes Historical Provider, MD  hydrocortisone 2.5 % lotion Apply topically 2 (two) times daily as needed.  12/05/13  Yes [name], MD  metFORMIN (GLUCOPHAGE) 850 MG tablet Take 850 mg by mouth 2 (two) times daily with a meal.   Yes Historical Provider, MD  omeprazole (PRILOSEC) 40 MG capsule Take 40 mg by mouth daily.  04/20/13  Yes Historical Provider, MD  propranolol ER (INDERAL LA) 80 MG 24 hr capsule Take 1 capsule (80 mg total) by mouth daily. 10/06/12  Yes [name], MD  zolpidem (AMBIEN) 10 MG tablet Take 10 mg by mouth at bedtime.  05/24/13  Yes Historical Provider, MD  predniSONE (DELTASONE) 20 MG tablet Take 20-60 mg by mouth daily. Take 3 tablets (60 mg) daily for 5 days, then take 2 tablets (40 mg) daily for 5 days, then take 1 tablet (20 mg) daily for 5 days, then stop.    Historical Provider, MD    BP 129/99  Temp(Src) 98.7 F (37.1 C) (Oral)  Ht 5\' 4"  (1.626 m)  Wt 183 lb (83.008 kg)  BMI 31.40 kg/m2  SpO2 94% Physical Exam CONSTITUTIONAL: elderly.  Awake/alert, but appears easily distractible HEAD: Normocephalic/atraumatic EYES: EOMI/PERRL ENMT: Mucous membranes moist NECK: supple no meningeal signs SPINE:entire spine nontender CV: S1/S2 noted, no murmurs/rubs/gallops noted LUNGS: Lungs are clear to auscultation bilaterally, no apparent distress ABDOMEN: soft, nontender, no rebound or guarding NEURO: Pt is awake/alert, moves all extremitiesx4, no arm/leg drift. Equal power with hip flexion noted in her lower extremities.  No facial droop.  She is oriented to person/place/time but is easily distracted and appears confused during exam Pt has hand tremor noted  EXTREMITIES: pulses normal, full ROM, no deformity SKIN: warm, color normal PSYCH: anxious  ED Course  Procedures  4:23 PM Pt here from home for worsening AMS for past 2 months Daughter reports it has been difficult to manage her at home AMS workup has been initiated Unclear if she has ever been formally diagnosed with dementia Reported h/o HIV per chart is incorrect, had recent negative HIV screen 6:13 PM D/w dr family medicine Will admit for UTI/delirium  Labs Review Labs Reviewed  CBC - Abnormal; Notable for the following:    WBC 10.7 (*)    All other components within normal limits  COMPREHENSIVE METABOLIC PANEL - Abnormal; Notable for the following:    Potassium 3.6 (*)    Glucose, Bld 127 (*)    Albumin 3.4 (*)    GFR calc non Af Amer 50 (*)    GFR calc Af Amer 58 (*)    Anion gap 16 (*)    All other components within normal limits  URINE RAPID DRUG SCREEN (HOSP PERFORMED) - Abnormal; Notable for the following:    Benzodiazepines POSITIVE (*)    All other components within normal limits  URINALYSIS, ROUTINE W REFLEX MICROSCOPIC - Abnormal; Notable for the following:    Color, Urine AMBER (*)     APPearance TURBID (*)    Hgb urine dipstick MODERATE (*)    Bilirubin Urine SMALL (*)    Ketones, ur 15 (*)    Protein, ur 30 (*)    Nitrite POSITIVE (*)    Leukocytes, UA LARGE (*)    All other components within normal limits  SALICYLATE LEVEL - Abnormal; Notable for the following:    Salicylate Lvl <2.9 (*)    All other components within normal limits  URINE MICROSCOPIC-ADD ON - Abnormal; Notable for the following:    Squamous Epithelial / LPF FEW (*)    Bacteria, UA MANY (*)  All other components within normal limits  ETHANOL  PROTIME-INR  APTT  DIFFERENTIAL  ACETAMINOPHEN LEVEL  I-STAT TROPOININ, ED    Imaging Review Dg Chest 2 View  12/10/2013   CLINICAL DATA:  Cough  EXAM: CHEST  2 VIEW  COMPARISON:  03/11/2012  FINDINGS: Heart is upper limits normal in size. No confluent opacities, effusions or edema. No acute bony abnormality.  IMPRESSION: No active cardiopulmonary disease.   Electronically Signed   By: Rolm Baptise M.D.   On: 12/10/2013 17:41   Ct Head Wo Contrast  12/10/2013   CLINICAL DATA:  Delusions and dementia.  EXAM: CT HEAD WITHOUT CONTRAST  TECHNIQUE: Contiguous axial images were obtained from the base of the skull through the vertex without intravenous contrast.  COMPARISON:  Head CT 10/07/2007.  FINDINGS: Mild cerebral atrophy. Patchy areas of decreased attenuation are noted throughout the deep and periventricular white matter of the cerebral hemispheres bilaterally, compatible with mild chronic microvascular ischemic disease. No acute intracranial abnormalities. Specifically, no evidence of acute intracranial hemorrhage, no definite findings of acute/subacute cerebral ischemia, no mass, mass effect, hydrocephalus or abnormal intra or extra-axial fluid collections. Visualized paranasal sinuses and mastoids are well pneumatized. No acute displaced skull fractures are identified.  IMPRESSION: 1. No acute intracranial abnormalities. 2. Mild cerebral atrophy with mild  chronic microvascular ischemic changes in the cerebral white matter, as above.   Electronically Signed   By: Vinnie Langton M.D.   On: 12/10/2013 16:41     EKG Interpretation   Date/Time:  Saturday December 10 2013 15:50:16 EDT Ventricular Rate:  68 PR Interval:  55 QRS Duration: 98 QT Interval:  437 QTC Calculation: 465 R Axis:   47 Text Interpretation:  Sinus rhythm Short PR interval Low voltage,  precordial leads Abnormal R-wave progression, early transition Nonspecific  T abnormalities, lateral leads artifact noted which limits exam Confirmed  by Christy Gentles  MD, [name] ([id number]) on 12/10/2013 4:16:42 PM      MDM   Final diagnoses:  Delirium  UTI (lower urinary tract infection)    Nursing notes including past medical history and social history reviewed and considered in documentation Labs/vital reviewed and considered Previous records reviewed and considered xrays reviewed and considered     Sharyon Cable, MD 12/10/13 1815

## 2013-12-10 NOTE — ED Notes (Signed)
Patient transported to CT 

## 2013-12-10 NOTE — ED Notes (Signed)
Awaiting CT results prior to swallow screen and oral medication.

## 2013-12-10 NOTE — ED Notes (Signed)
Diet tray ordered 

## 2013-12-10 NOTE — ED Notes (Signed)
Daughter stated sghe also has hives breaking out on her  And itching for 2 weeks.

## 2013-12-11 ENCOUNTER — Encounter: Payer: Self-pay | Admitting: Family Medicine

## 2013-12-11 ENCOUNTER — Encounter (HOSPITAL_COMMUNITY): Payer: Self-pay | Admitting: Family Medicine

## 2013-12-11 DIAGNOSIS — R4189 Other symptoms and signs involving cognitive functions and awareness: Secondary | ICD-10-CM

## 2013-12-11 HISTORY — DX: Other symptoms and signs involving cognitive functions and awareness: R41.89

## 2013-12-11 LAB — LIPID PANEL
Cholesterol: 191 mg/dL (ref 0–200)
HDL: 31 mg/dL — ABNORMAL LOW (ref >39)
LDL Cholesterol: 123 mg/dL — ABNORMAL HIGH (ref 0–99)
Total CHOL/HDL Ratio: 6.2 RATIO
Triglycerides: 184 mg/dL — ABNORMAL HIGH (ref <150)
VLDL: 37 mg/dL (ref 0–40)

## 2013-12-11 LAB — HEMOGLOBIN A1C
Hgb A1c MFr Bld: 6.6 % — ABNORMAL HIGH (ref <5.7)
Mean Plasma Glucose: 143 mg/dL — ABNORMAL HIGH (ref <117)

## 2013-12-11 LAB — GLUCOSE, CAPILLARY
Glucose-Capillary: 126 mg/dL — ABNORMAL HIGH (ref 70–99)
Glucose-Capillary: 115 mg/dL — ABNORMAL HIGH (ref 70–99)
Glucose-Capillary: 141 mg/dL — ABNORMAL HIGH (ref 70–99)
Glucose-Capillary: 103 mg/dL — ABNORMAL HIGH (ref 70–99)

## 2013-12-11 LAB — TROPONIN I: Troponin I: <0.3 ng/mL (ref <0.30)

## 2013-12-11 NOTE — Evaluation (Signed)
Physical Therapy Evaluation Patient Details Name: [name] MRN: [id number] DOB: 1945-12-21 Today's Date: 12/11/2013   History of Present Illness    [name] is a 68 y.o. female presenting with chest pain. Additionally, pt. has had Altered mental status, bowel and bladder incontinence, and hallucinations as well as drug seeking / odd behavior noted by her family members. PMH is significant for DMII, MDD, Esophageal Stricture, HLD, Parkinson's Disease, HTN, Anxiety.   Clinical Impression  Pt presents with moderate functional mobility limitation due to cognitive limitations impacting judgment and safety, and balance impairments in setting of injurious fall history.  Spouse desperately needs medical support and eduction in order to better understand patient's medical conditions and how to best assist in her care; after discussion with him I've recommended he renew relationship with patient's neurologist and strongly recommend short term SNF for therapy prior to returning home in order to minimize the overall burden of care as well as mitigate risk for fall or fall related injury.  Spouse in agreement with this plan.  Will plan to see patient in acute setting until d/c and ask MD to refer CSW to initiate bed search for short term SNF placement.      Follow Up Recommendations SNF;Supervision/Assistance - 24 hour    Equipment Recommendations  None recommended by PT    Recommendations for Other Services       Precautions / Restrictions Precautions Precautions: Fall Precaution Comments: History of falls, mental confusion, parkinsons disease; gave chair alarm to RN to place in chair;  Restrictions Other Position/Activity Restrictions: recommend elevate extremities due to +1 edema L>R      Mobility  Bed Mobility Overal bed mobility: Needs Assistance Bed Mobility: Sidelying to Sit   Sidelying to sit: Supervision       General bed mobility comments: standby for  impulsivitiy  Transfers Overall transfer level: Needs assistance Equipment used: None Transfers: Sit to/from Stand Sit to Stand: Supervision         General transfer comment: standby for safety; cues to lock chair before sitting  Ambulation/Gait Ambulation/Gait assistance: Supervision;Min guard Ambulation Distance (Feet): 225 Feet Assistive device: 1 person hand held assist;None Gait Pattern/deviations: Step-through pattern;Decreased stride length;Shuffle;Staggering left;Narrow base of support Gait velocity: decr Gait velocity interpretation: Below normal speed for age/gender General Gait Details: clears floor bilaterally, unable to incorporate cues for incr heel strike this session due to multiple stimuli in environment; LOB with quick turns, difficulty following all instruction for DGI;   Stairs            Wheelchair Mobility    Modified Rankin (Stroke Patients Only)       Balance Overall balance assessment: Needs assistance;History of Falls Sitting-balance support: No upper extremity supported;Feet supported Sitting balance-Leahy Scale: Good     Standing balance support: During functional activity Standing balance-Leahy Scale: Fair                   Standardized Balance Assessment Standardized Balance Assessment : Dynamic Gait Index   Dynamic Gait Index Level Surface: Mild Impairment Change in Gait Speed: Moderate Impairment Gait with Horizontal Head Turns: Mild Impairment Gait with Vertical Head Turns: Moderate Impairment Gait and Pivot Turn: Moderate Impairment Step Over Obstacle: Moderate Impairment Step Around Obstacles: Moderate Impairment Steps: Moderate Impairment Total Score: 10       Pertinent Vitals/Pain Pain Assessment: No/denies pain    Home Living Family/patient expects to be discharged to:: Private residence Living Arrangements: Spouse/significant other Available Help at  Discharge: Family;Available 24 hours/day Type of Home:  House Home Access: Stairs to enter Entrance Stairs-Rails: Can reach both Entrance Stairs-Number of Steps: 3 Home Layout: One level Home Equipment: Walker - 2 wheels Additional Comments: pt has but does not use walker; states she does 'fine'; reports hx falls due to tripping, discussed risk of area rugs and transitional surfaces; reports bilateral foot fractures suffered in 2 differnt falls; reports fall on stairs but unclear if she is reliable; spouse wants more inforamtiona nd support to understand how to best manage patient's medical needs and maintain her safety    Prior Function Level of Independence: Independent         Comments: with confusion, pt needs increased assistance for decision making/judgement     Hand Dominance        Extremity/Trunk Assessment   Upper Extremity Assessment: Overall WFL for tasks assessed (intention tremor noted)           Lower Extremity Assessment: Generalized weakness (variable, generally +/- 3/5 in legs sitting)      Cervical / Trunk Assessment: Normal  Communication   Communication: No difficulties  Cognition Arousal/Alertness: Awake/alert Behavior During Therapy: Flat affect;WFL for tasks assessed/performed Overall Cognitive Status: Impaired/Different from baseline Area of Impairment: Safety/judgement;Following commands     Memory: Decreased recall of precautions Following Commands: Follows one step commands inconsistently Safety/Judgement: Decreased awareness of safety     General Comments: difficulty following verbal directions, moderately improved with demonstrational cues (eg 'step over' = steps on; 'look to left' = pt does not look)    General Comments General comments (skin integrity, edema, etc.): +1 edema noted L>R; elevated legs and rolled sock top down    Exercises General Exercises - Lower Extremity Ankle Circles/Pumps: AROM;Both;5 reps;Strengthening;Seated Quad Sets: AROM;Strengthening;Both;5 reps;Seated Long  Arc Quad: AROM;Strengthening;Both;5 reps;Seated      Assessment/Plan    PT Assessment Patient needs continued PT services  PT Diagnosis Abnormality of gait   PT Problem List Decreased knowledge of precautions;Decreased safety awareness;Decreased knowledge of use of DME;Decreased cognition;Decreased coordination;Decreased mobility;Decreased balance;Decreased strength  PT Treatment Interventions Patient/family education;Cognitive remediation;Neuromuscular re-education;Balance training;Therapeutic exercise;Therapeutic activities;Functional mobility training;Stair training;Gait training;DME instruction   PT Goals (Current goals can be found in the Care Plan section) Acute Rehab PT Goals Patient Stated Goal: not fall again PT Goal Formulation: With patient/family Time For Goal Achievement: 12/25/13 Potential to Achieve Goals: Good    Frequency Min 3X/week   Barriers to discharge   spouse also cares for son with visual impairment    Co-evaluation               End of Session Equipment Utilized During Treatment: Gait belt Activity Tolerance: Patient limited by fatigue;Patient tolerated treatment well Patient left: in chair;with chair alarm set;with nursing/sitter in room (gave chair alarm pad to RN, she is getting box) Nurse Communication: Mobility status;Precautions         Time: 1610-9604 PT Time Calculation (min): 43 min   Charges:   PT Evaluation $Initial PT Evaluation Tier I: 1 Procedure PT Treatments $Gait Training: 8-22 mins $Therapeutic Exercise: 8-22 mins   PT G Codes:          Herbie Drape 12/11/2013, 11:17 AM

## 2013-12-11 NOTE — Progress Notes (Signed)
Patient ID: KANDANCE YANO, female   DOB: January 21, 1946, 68 y.o.   MRN: [id number] Family Medicine Teaching Service Daily Progress Note Intern Pager: [phone]  [name] Medical record number: [id number] Date of birth: [birth year] Age: 68 y.o. Gender: female  Primary Care Provider: [name], MD Consultants: None  Code Status: Full  Pt Overview and Major Events to Date:  8/29 - Admitted for chest pain and AMS  Assessment and Plan: KHRISTINA JANOTA is a 68 y.o. female presenting with chest pain. Additionally, pt. has had Altered mental status, bowel and bladder incontinence, and hallucinations as well as drug seeking / odd behavior noted by her family members. PMH is significant for DMII, MDD, Esophageal Stricture, HLD, Parkinson's Disease, HTN, Anxiety.   1. Chest Pain rule out ACS: Vital signs stable. Chest pain appears to be paroxysmal. Initial workup revealed EKG without acute changes, Troponin WNL, and otherwise stable. No chest pain currently. - Troponins x2 negative - HEART score 3, ASCVD 10-yr risk 28.7% -> high intensity statin recommended for discharge - Vital signs per unit.  - Nitroglycerin prn chest pain.  -cards recs  2. Altered Mental Status: Complaints of hallucinations with ambien at home, as well as family members reporting evidence of taking highly sedative / mind altering medications that are not prescribed to her including vistaril, vicadin, and others. Additionally, U/A positive for bacterial infection. Likely contributing to her altered mental status. Pt. aso has parkinson's disease giving her an additional component of cognitive dysfunction, and is taking sinemet which may cause cognitive disturbance as well. Confused this AM.  - Continue to monitor mental status  - Continue sinemet while holding other mind altering medications.  - No haldol for agitation. Recommend Seroquel for self-endangering behavior.  - Will adjust medical therapy as  indicated.  - UDS + for benzos on CIWA protocol for potential withdrawal -scores, 5,6 (elevated due to Parkinson component) -MMSE 18 - possible dementia component to Parkinson's -Will need to follow-up in [place] clinic in 4-6wks.   3. UTI: UA with nitrites and leuks. S/p CTX in ED. Denies any symptoms this AM. -Treating UTI with Ceftriaxone IV 1g qd.  -UCx pending -Kelfex 5 day course given for discharge  4. Parkinson's Disease: Pt. States that her baseline level of functioning is fair. She does state that she has had an increased number of falls lately due to "tripping over her feet".  - Continue sinemet at current dosing  - PT evaluation for current dispo. -pending -fall precautions  5. Major Depression / Anxiety:  - Continue Prozac 40mg  qd.  - Not on prn anxiety medication. Will continue to monitor her mental status.   6. HTN  - Continue propranolol 80mg  qd.   7. DMII - Last A1C 6.0, on Metformin  - Carb Modified diet  - Sensitive SSI  - A1C pending  - hold home metformin   FEN/GI: Carb Modified Diet / pantoprazole  Prophylaxis: Sub-Q heparin  Disposition: Continue current management as above; discharge pending improvement and PT eval.  Subjective:  Patient doing well this morning. She was sitting in bed eating. She has no complaints. She is confused still this morning and feels like we are tricking her with our questions. She denies anymore chest pain at this time.  Objective: Temp:  [97.8 F (36.6 C)-99 F (37.2 C)] 98.6 F (37 C) (08/30 0400) Pulse Rate:  [65-75] 71 (08/30 0400) Resp:  [13-20] 15 (08/30 0400) BP: (112-147)/(41-99) 129/51 mmHg (08/30 0400) SpO2:  [  94 %-99 %] 94 % (08/30 0400) Weight:  [183 lb (83.008 kg)-183 lb 1.6 oz (83.054 kg)] 183 lb 1.6 oz (83.054 kg) (08/29 2100) Physical Exam: General: NAD, alert, eating in bed, confused and slightly frustrated HEENT: NCAT, MMM, EOMI Cardiovascular: RRR, No MGR  Respiratory: CTA Bilaterally, no wheezes or  crackles appreciated Abdomen: Soft, Nontender, no suprapubic tenderness, +BS  Extremities: WWP, 2+ Distal pulses bilaterally  Skin: No evidence of rashes, or other abnormalities  Neuro: AAOx2, no neurological deficits appreciated.   Laboratory:  Recent Labs Lab 12/10/13 1549 12/10/13 2212  WBC 10.7* 9.6  HGB 12.8 12.3  HCT 37.8 36.4  PLT 327 268    Recent Labs Lab 12/10/13 1549 12/10/13 2212  NA 137  --   K 3.6*  --   CL 99  --   CO2 22  --   BUN 15  --   CREATININE 1.10 1.11*  CALCIUM 9.1  --   PROT 7.4  --   BILITOT 0.8  --   ALKPHOS 77  --   ALT <5  --   AST 16  --   GLUCOSE 127*  --    Urinalysis    Component Value Date/Time   COLORURINE AMBER* 12/10/2013 1600   APPEARANCEUR TURBID* 12/10/2013 1600   LABSPEC 1.023 12/10/2013 1600   PHURINE 5.5 12/10/2013 1600   GLUCOSEU NEGATIVE 12/10/2013 1600   HGBUR MODERATE* 12/10/2013 1600   HGBUR trace-lysed 06/28/2008 0900   BILIRUBINUR SMALL* 12/10/2013 1600   KETONESUR 15* 12/10/2013 1600   PROTEINUR 30* 12/10/2013 1600   UROBILINOGEN 1.0 12/10/2013 1600   NITRITE POSITIVE* 12/10/2013 1600   LEUKOCYTESUR LARGE* 12/10/2013 1600   UCx - pending  Imaging/Diagnostic Tests: Dg Chest 2 View 12/10/2013   CLINICAL DATA:  Cough  EXAM: CHEST  2 VIEW  COMPARISON:  03/11/2012  FINDINGS: Heart is upper limits normal in size. No confluent opacities, effusions or edema. No acute bony abnormality.  IMPRESSION: No active cardiopulmonary disease.   Electronically Signed   By: Rolm Baptise M.D.   On: 12/10/2013 17:41   Ct Head Wo Contrast 12/10/2013   CLINICAL DATA:  Delusions and dementia.  EXAM: CT HEAD WITHOUT CONTRAST  TECHNIQUE: Contiguous axial images were obtained from the base of the skull through the vertex without intravenous contrast.  COMPARISON:  Head CT 10/07/2007.  FINDINGS: Mild cerebral atrophy. Patchy areas of decreased attenuation are noted throughout the deep and periventricular white matter of the cerebral hemispheres  bilaterally, compatible with mild chronic microvascular ischemic disease. No acute intracranial abnormalities. Specifically, no evidence of acute intracranial hemorrhage, no definite findings of acute/subacute cerebral ischemia, no mass, mass effect, hydrocephalus or abnormal intra or extra-axial fluid collections. Visualized paranasal sinuses and mastoids are well pneumatized. No acute displaced skull fractures are identified.  IMPRESSION: 1. No acute intracranial abnormalities. 2. Mild cerebral atrophy with mild chronic microvascular ischemic changes in the cerebral white matter, as above.   Electronically Signed   By: Vinnie Langton M.D.   On: 12/10/2013 16:41    Katheren Shams, DO 12/11/2013, 7:19 AM PGY-1, [place] Intern pager: [phone], text pages welcome

## 2013-12-11 NOTE — Progress Notes (Signed)
At 0230 and 0502 pt had short episodes of SVT. Pt asymptomatic both times. Pt checked and strips noted. Will cont to monitor.

## 2013-12-11 NOTE — Progress Notes (Signed)
I have seen and examined this patient. I have discussed with Dr Gerarda Fraction.  I agree with their findings and plans as documented in their progress note.  If patient is not to undergo further cardiology evaluation, then patient may be discharged home with follow up at [facility] clinic.

## 2013-12-11 NOTE — Progress Notes (Signed)
UR Completed.  Aly Hauser Jane [phone] 12/11/2013  

## 2013-12-11 NOTE — H&P (Signed)
I have seen and examined this patient. I have discussed with Drs Minda Ditto and Caryl Bis .  I agree with their findings.   Patient and husband were present for the interview and examand plans as documented in their admission note. Patient and husband were sources of clinical information, along with the EMR.    Acute Issues 1. Delirium - Working explanation is precipitation of delirium with use of anticholinergic hydroxyzine for itching and possible a stolen benzodiazipine and taking 3 Vicodin tablets at a time, along with possible UTI in patient on psychiatric medications and possible progressive cognitive impairment in Parkinson's disease.   2. UTI diagnosis based on new urgency and frequency with change in mentation and pyuria on urine microscopy.  3. Possible Cognitive Impairment - MMSE 18 out of 30 in patient with > 12th grade education.  MMSE - Mini Mental State Exam 12/11/2013 10/02/2010  Orientation to time 1 5  Orientation to Place 5 5  Registration 3 3  Attention/ Calculation 1 5  Recall 0 2  Language- name 2 objects 2 2  Language- repeat 1 1  Language- follow 3 step command 3 3  Language- read & follow direction 1 1  Write a sentence 1 1  Copy design 0 0  Total score 18 28  - Patient has become dependent in iADLs (transportation and meal preparation in last 2 years) - Patient denied prolonged sadness or depressed mood and denied loss of pleasure in life.  - Pt was attentive and cooperative and seemed motivated to complete the MMSE test.   She was alert for 30 minutes of exam.  Her thoughts and actions appeared coherent and goal directed.    Recommend - Counseled about effects of Anticholinergic in older patients and in cognitively impaired patient. Instructed to use non-sedating antihistamines for itching.  - Counseled against use of Vicodin at doses greater than prescribed.  - Counseled against use of other patient's medications.  - Recommended using a pill schedule box  for her medication to decrease chances of taking too much medication or missing a dose of medication.   - Consider repeat MMSE in 4 to 6 weeks to see if diminishment in cognitive capacities persist.

## 2013-12-11 NOTE — Care Management Note (Addendum)
    Page 1 of 2   12/13/2013     2:39:24 PM CARE MANAGEMENT NOTE 12/13/2013  Patient:  [name], [name]   Account Number:  [id number]  Date Initiated:  12/11/2013  Documentation initiated by:  [facility]  Subjective/Objective Assessment:   adm:  chest pain     Action/Plan:   discharge planning: SNF vs [place]   Anticipated DC Date:  12/12/2013   Anticipated DC Plan:           [facility] Choice  HOME HEALTH   Choice offered to / List presented to:  C-1 Patient        [place] arranged  [place].   Status of service:  Completed, signed off Medicare Important Message given?  YES (If response is "NO", the following Medicare IM given date fields will be blank) Date Medicare IM given:  12/12/2013 Medicare IM given by:  GRAVES-BIGELOW,Mariaclara Spear Date Additional Medicare IM given:   Additional Medicare IM given by:    Discharge Disposition:  [place]  Per UR Regulation:  Reviewed for med. necessity/level of care/duration of stay  If discussed at [place] of Stay Meetings, dates discussed:    Comments:  12-13-13 [address] Jacqlyn Krauss, RN,BSN [phone] CM did send referral to [facility] and [facility] to begin within 24-48 hours post d/c.   12-12-13 [place] [phone] Pt now would like to go to [facility] once medically stable for d/c. CSW to be informed. No further needs from CM at this time.  12/11/13 16:00 CM met with pt in room but no other family member was present.  Pt states she wants to go home however she also states she's enjoying the beach here.  If the plan is to go home, she states she had [facility] in the past and would like to have them as her [facility] agency.  Will continue to follow.  Mariane Masters, BSN, [place].

## 2013-12-12 DIAGNOSIS — R0789 Other chest pain: Secondary | ICD-10-CM

## 2013-12-12 DIAGNOSIS — E119 Type 2 diabetes mellitus without complications: Secondary | ICD-10-CM

## 2013-12-12 DIAGNOSIS — I10 Essential (primary) hypertension: Secondary | ICD-10-CM

## 2013-12-12 DIAGNOSIS — G9340 Encephalopathy, unspecified: Secondary | ICD-10-CM

## 2013-12-12 LAB — GLUCOSE, CAPILLARY
Glucose-Capillary: 97 mg/dL (ref 70–99)
Glucose-Capillary: 141 mg/dL — ABNORMAL HIGH (ref 70–99)
Glucose-Capillary: 98 mg/dL (ref 70–99)
Glucose-Capillary: 128 mg/dL — ABNORMAL HIGH (ref 70–99)

## 2013-12-12 MED ORDER — CEPHALEXIN 500 MG PO CAPS: 500.0000 mg | ORAL_CAPSULE | Freq: Two times a day (BID) | ORAL | Status: DC

## 2013-12-12 NOTE — Discharge Summary (Signed)
[facility] Discharge Summary  [name] Medical record number: [id number] Date of birth: 05-18-1945 Age: 68 y.o. Gender: female Date of Admission: 12/10/2013  Date of Discharge: 12/13/2013 Admitting Physician: Blane Ohara McDiarmid, MD  Primary Care Provider: [name], MD Consultants: None  Indication for Hospitalization: Altered Mental Status  Discharge Diagnoses/Problem List:  Chest Pain Rule Out ACS Altered Mental Status Urinary Tract Infection Parkinson's Disease Major Depression Disorder / Generalized Anxiety Disorder Hypertension  Diabetes Mellitus II  Disposition: Discharge to Home with Home Health and PT / OT  Discharge Condition: Stable  Discharge Exam:  Temp: [98.2 F (36.8 C)-99.9 F (37.7 C)] 98.2 F (36.8 C) (08/31 0650)  Pulse Rate: [63-74] 64 (08/31 0650)  Resp: [18] 18 (08/31 0650)  BP: (125-151)/(51-81) 130/56 mmHg (08/31 0650)  SpO2: [93 %-96 %] 96 % (08/31 0650)  Weight: [181 lb 6.4 oz (82.283 kg)] 181 lb 6.4 oz (82.283 kg) (08/31 0650)   Physical Exam:  General: NAD, alert, eating in bed, confused this am regarding her reasons for admission. Remembers me personally, and events since admission.  HEENT: NCAT, MMM, EOMI  Cardiovascular: RRR, No MGR  Respiratory: CTA Bilaterally, no wheezes or crackles appreciated  Abdomen: Soft, Nontender, no suprapubic tenderness, +BS  Extremities: WWP, 2+ Distal pulses bilaterally  Skin: No evidence of rashes, or other abnormalities  Neuro: AAOx2, no neurological deficits appreciated. Grossly normal.    Brief Hospital Course:   # Chest Pain rule out ACS: The patient initially presented with dull substernal, nonradiating chest pain. Her vital signs remained stable and within normal limits throughout her hospitalization. Her initial workup revealed EKG without acute changes, Troponin negative x 2, and otherwise stable. Her chest pain subsequently resolved. Her HEART score  was 3 at admission, and her 10 year ASCVD risk is 28.7% requiring initiation of high intensity statin at discharge. From a cardiac standpoint, ACS has been ruled out at this admission. She is stable and ready for discharge with outpatient cardiology follow up.   # Altered Mental Status: She was initially admitted with complaints of hallucinations with ambien at home of which she had been taking many times the recommended dose. Additionally, she was taking multiple unprescribed medications including Vicodin and Vistaril as well as other. Additionally, her U/A  at admission was positive for bacterial infection. This likely contributed to her altered mental status. Pt. aso has parkinson's disease giving her another component of cognitive dysfunction. Her UDS was also positive for Benzodiazepines which she was not prescribed. She was admitted for observation, and had all mind altering medications held. Dr. McDiarmid was able to perform MMSE which was 18. At this point it appears that she may have a component of worsening dementia in combination with her Parkinson's disease as well as acute delerium at this hospital visit as a result of medication overdose / inability to take her medications properly due to her worsening dementia. Will need ongoing follow up and further care via SNF and Geriatric follow up to ensure proper care as her disease progresses.   3. UTI: At admission pt. Had symptoms of frequency and urgency. Additionally, she had a UA with nitrites and leukocytes. She was started on 1G of ceftriaxone daily on her admission. She will be discharged on Keflex for a 5 day course. Urine Cultures are pending.   4. Parkinson's Disease: At admission the Pt. States that her baseline level of functioning is fair. However, she says that she has had an  increased level of tremors over the past few months, and that she has been "tripping over her feet" and falling frequently. She has been receiving Sinemet for her  Parkinson's disease. This was continued during her hospitalization and will be continued at discharge.   5. Major Depression / Anxiety: At admission the patient was somewhat anxious. However, she was not given any prn anxiety medications due to her altered mental status, and apparent previous dependence. Her Prozac was continued, and will be continued as an outpatient.     Issues for Follow Up:  - Chest pain: Follow up symptoms. Appeared to be paroxysmal per our discussion with the patient. She will need outpatient cardiology follow up.  - Altered Mental Status: Will need further geriatric care as it is apparent multifactorial given her medication use / abuse, worsening Parkinson's with questionable Dementia component, and urinary tract infection. Would hold all mind altering medications. She was prescribed Ambien by psychiatry, which we will discontinue prior to discharge.  - Parkinson's Disease: On sinamet currently. Follow up the status of her disease as well as further evaluation for dementia by Geriatrics.   Significant Procedures: None  Significant Labs and Imaging:   Recent Labs Lab 12/10/13 1549 12/10/13 2212  WBC 10.7* 9.6  HGB 12.8 12.3  HCT 37.8 36.4  PLT 327 268    Recent Labs Lab 12/10/13 1549 12/10/13 2212  NA 137  --   K 3.6*  --   CL 99  --   CO2 22  --   GLUCOSE 127*  --   BUN 15  --   CREATININE 1.10 1.11*  CALCIUM 9.1  --   ALKPHOS 77  --   AST 16  --   ALT <5  --   ALBUMIN 3.4*  --    Imaging/Diagnostic Tests:  Dg Chest 2 View  12/10/2013 CLINICAL DATA: Cough EXAM: CHEST 2 VIEW COMPARISON: 03/11/2012 FINDINGS: Heart is upper limits normal in size. No confluent opacities, effusions or edema. No acute bony abnormality. IMPRESSION: No active cardiopulmonary disease. Electronically Signed By: Rolm Baptise M.D. On: 12/10/2013 17:41   Ct Head Wo Contrast  12/10/2013 CLINICAL DATA: Delusions and dementia. EXAM: CT HEAD WITHOUT CONTRAST TECHNIQUE: Contiguous  axial images were obtained from the base of the skull through the vertex without intravenous contrast. COMPARISON: Head CT 10/07/2007. FINDINGS: Mild cerebral atrophy. Patchy areas of decreased attenuation are noted throughout the deep and periventricular white matter of the cerebral hemispheres bilaterally, compatible with mild chronic microvascular ischemic disease. No acute intracranial abnormalities. Specifically, no evidence of acute intracranial hemorrhage, no definite findings of acute/subacute cerebral ischemia, no mass, mass effect, hydrocephalus or abnormal intra or extra-axial fluid collections. Visualized paranasal sinuses and mastoids are well pneumatized. No acute displaced skull fractures are identified. IMPRESSION: 1. No acute intracranial abnormalities. 2. Mild cerebral atrophy with mild chronic microvascular ischemic changes in the cerebral white matter, as above. Electronically Signed By: Vinnie Langton M.D. On: 12/10/2013 16:41    Results/Tests Pending at Time of Discharge: Urine culture.   Discharge Medications:    Medication List    STOP taking these medications       HYDROcodone-acetaminophen 5-325 MG per tablet  Commonly known as:  NORCO/VICODIN     zolpidem 10 MG tablet  Commonly known as:  AMBIEN      TAKE these medications       cephALEXin 500 MG capsule  Commonly known as:  KEFLEX  Take 1 capsule (500 mg total) by mouth 2 (two)  times daily.     FLUoxetine 40 MG capsule  Commonly known as:  PROZAC  Take 40 mg by mouth daily.     gabapentin 300 MG capsule  Commonly known as:  NEURONTIN  Take 300 mg by mouth 3 (three) times daily.     hydrocortisone 2.5 % lotion  Apply topically 2 (two) times daily as needed.     metFORMIN 850 MG tablet  Commonly known as:  GLUCOPHAGE  Take 850 mg by mouth 2 (two) times daily with a meal.     omeprazole 40 MG capsule  Commonly known as:  PRILOSEC  Take 40 mg by mouth daily.     predniSONE 20 MG tablet  Commonly  known as:  DELTASONE  Take 20-60 mg by mouth daily. Take 3 tablets (60 mg) daily for 5 days, then take 2 tablets (40 mg) daily for 5 days, then take 1 tablet (20 mg) daily for 5 days, then stop.     propranolol ER 80 MG 24 hr capsule  Commonly known as:  INDERAL LA  Take 1 capsule (80 mg total) by mouth daily.     SINEMET 25-100 MG per tablet  Generic drug:  carbidopa-levodopa  Take 1.5 tablets by mouth 3 (three) times daily.        Discharge Instructions: Please refer to Patient Instructions section of EMR for full details.  Patient was counseled important signs and symptoms that should prompt return to medical care, changes in medications, dietary instructions, activity restrictions, and follow up appointments.   Follow-Up Appointments: Follow-up Information   Follow up with [name], MD On 01/12/2014. (Cardiology Follow Up - appointment is at 2:15 pm. )    Specialty:  Cardiology   Contact information:   [place] [address] [phone]       Follow up with Zigmund Gottron, MD On 12/16/2013. [facility] Follow Up at @ 2:30pm. )    Specialty:  Family Medicine   Contact information:   [place] [address] [phone]     Cardiology - Dr. [name] Thursday Oct 1 at 2:15   Aquilla Hacker, MD 12/13/2013, 12:29 PM PGY-1, [place]

## 2013-12-12 NOTE — Progress Notes (Signed)
Patient ID: [name], female   DOB: 27-Jun-1945, 68 y.o.   MRN: [id number] Family Medicine Teaching Service Daily Progress Note Intern Pager: [phone]  [name] Medical record number: [id number] Date of birth: [birth year] Age: 68 y.o. Gender: female  Primary Care Provider: [name], MD Consultants: None  Code Status: Full  [name] Overview and Major Events to Date:  8/29 - Admitted for chest pain and AMS  Assessment and Plan: [name] is a 68 y.o. female presenting with chest pain. Additionally, [name]. has had Altered mental status, bowel and bladder incontinence, and hallucinations as well as drug seeking / odd behavior noted by her family members. PMH is significant for DMII, MDD, Esophageal Stricture, HLD, Parkinson's Disease, HTN, Anxiety.   1. Chest Pain rule out ACS: Vital signs stable. Chest pain appears to be paroxysmal. Initial workup revealed EKG without acute changes, Troponin WNL, and otherwise stable. No chest pain currently. - Troponins x2 negative - HEART score 3, ASCVD 10-yr risk 28.7% -> high intensity statin recommended for discharge - Vital signs per unit.  - Nitroglycerin prn chest pain.  - Chest pain resolved, No further symptoms.   2. Altered Mental Status: Complaints of hallucinations with ambien at home, as well as family members reporting evidence of taking highly sedative / mind altering medications that are not prescribed to her including vistaril, vicadin, and others. Additionally, U/A positive for bacterial infection. Likely contributing to her altered mental status. [name]. aso has parkinson's disease giving her an additional component of cognitive dysfunction, and is taking sinemet which may cause cognitive disturbance as well. Confused this AM.  - Continue to monitor mental status  - Continue sinemet while holding other mind altering medications.  - No haldol for agitation. Recommend Seroquel for self-endangering behavior.  - Will  adjust medical therapy as indicated.  - UDS + for benzos on CIWA protocol for potential withdrawal -scores, 5,6 (elevated due to Parkinson component) -MMSE 18 - possible dementia component to Parkinson's -Will need to follow-up in [facility] in 4-6wks.  - Per [name]. She is still confused. Husband feels she is at baseline. At this time they would prefer not to go to SNF, however [name] recs strongly for SNF due to fall risk. Additionally, it appears to be difficult for husband to take care of her as he acknowledges caregiver fatigue to [place] privately. Will plan for further discussion regarding dispo, as this is the only thing preventing discharge at this time.    3. UTI: UA with nitrites and leuks. S/p CTX in ED. Denies any symptoms this AM. -Treating UTI with Ceftriaxone IV 1g qd.  -UCx pending -Kelfex 5 day course given for discharge  4. Parkinson's Disease: [name]. States that her baseline level of functioning is fair. She does state that she has had an increased number of falls lately due to "tripping over her feet".  - Continue sinemet at current dosing  - [name] evaluation for current dispo. - their recs are strongly for SNF given risks of falls.  - fall precautions  5. Major Depression / Anxiety:  - Continue Prozac 40mg  qd.  - Not on prn anxiety medication. Will continue to monitor her mental status.   6. HTN  - Continue propranolol 80mg  qd.  - Blood pressures well controlled.   7. DMII - Last A1C 6.0, on Metformin  - Carb Modified diet  - Sensitive SSI  - A1C currently 6.6 - hold home metformin   FEN/GI: Carb Modified  Diet / pantoprazole  Prophylaxis: Sub-Q heparin  Disposition: Continue discussion re placement vs. Home. Discharge today vs. Tomorrow.   Subjective:  [name]. Appears well this morning. She is alert and eating on my entrance. She recognized me from her admission Saturday. Upon asking her about how she feels she responds with " I am confused. I feel as if I'm in a Web designer. I feel that there are lots of secretive things going on." She says that she no longer has chest pain. She understands the potential need to go to SNF, however she adamantly says that she wants to go home to be with her son.   Objective: Temp:  [98.2 F (36.8 C)-99.9 F (37.7 C)] 98.2 F (36.8 C) (08/31 0650) Pulse Rate:  [63-74] 64 (08/31 0650) Resp:  [18] 18 (08/31 0650) BP: (125-151)/(51-81) 130/56 mmHg (08/31 0650) SpO2:  [93 %-96 %] 96 % (08/31 0650) Weight:  [181 lb 6.4 oz (82.283 kg)] 181 lb 6.4 oz (82.283 kg) (08/31 0650) Physical Exam: General: NAD, alert, eating in bed, confused this am regarding her reasons for admission. Remembers me personally, and events since admission.   HEENT: NCAT, MMM, EOMI Cardiovascular: RRR, No MGR  Respiratory: CTA Bilaterally, no wheezes or crackles appreciated Abdomen: Soft, Nontender, no suprapubic tenderness, +BS  Extremities: WWP, 2+ Distal pulses bilaterally  Skin: No evidence of rashes, or other abnormalities  Neuro: AAOx2, no neurological deficits appreciated. Grossly normal.   Laboratory:  Recent Labs Lab 12/10/13 1549 12/10/13 2212  WBC 10.7* 9.6  HGB 12.8 12.3  HCT 37.8 36.4  PLT 327 268    Recent Labs Lab 12/10/13 1549 12/10/13 2212  NA 137  --   K 3.6*  --   CL 99  --   CO2 22  --   BUN 15  --   CREATININE 1.10 1.11*  CALCIUM 9.1  --   PROT 7.4  --   BILITOT 0.8  --   ALKPHOS 77  --   ALT <5  --   AST 16  --   GLUCOSE 127*  --    Urinalysis    Component Value Date/Time   COLORURINE AMBER* 12/10/2013 1600   APPEARANCEUR TURBID* 12/10/2013 1600   LABSPEC 1.023 12/10/2013 1600   PHURINE 5.5 12/10/2013 1600   GLUCOSEU NEGATIVE 12/10/2013 1600   HGBUR MODERATE* 12/10/2013 1600   HGBUR trace-lysed 06/28/2008 0900   BILIRUBINUR SMALL* 12/10/2013 1600   KETONESUR 15* 12/10/2013 1600   PROTEINUR 30* 12/10/2013 1600   UROBILINOGEN 1.0 12/10/2013 1600   NITRITE POSITIVE* 12/10/2013 1600   LEUKOCYTESUR LARGE*  12/10/2013 1600   UCx - pending Glucose - 97  Imaging/Diagnostic Tests: Dg Chest 2 View 12/10/2013   CLINICAL DATA:  Cough  EXAM: CHEST  2 VIEW  COMPARISON:  03/11/2012  FINDINGS: Heart is upper limits normal in size. No confluent opacities, effusions or edema. No acute bony abnormality.  IMPRESSION: No active cardiopulmonary disease.   Electronically Signed   By: Rolm Baptise M.D.   On: 12/10/2013 17:41   Ct Head Wo Contrast 12/10/2013   CLINICAL DATA:  Delusions and dementia.  EXAM: CT HEAD WITHOUT CONTRAST  TECHNIQUE: Contiguous axial images were obtained from the base of the skull through the vertex without intravenous contrast.  COMPARISON:  Head CT 10/07/2007.  FINDINGS: Mild cerebral atrophy. Patchy areas of decreased attenuation are noted throughout the deep and periventricular white matter of the cerebral hemispheres bilaterally, compatible with mild chronic microvascular ischemic disease. No  acute intracranial abnormalities. Specifically, no evidence of acute intracranial hemorrhage, no definite findings of acute/subacute cerebral ischemia, no mass, mass effect, hydrocephalus or abnormal intra or extra-axial fluid collections. Visualized paranasal sinuses and mastoids are well pneumatized. No acute displaced skull fractures are identified.  IMPRESSION: 1. No acute intracranial abnormalities. 2. Mild cerebral atrophy with mild chronic microvascular ischemic changes in the cerebral white matter, as above.   Electronically Signed   By: Vinnie Langton M.D.   On: 12/10/2013 16:41    Aquilla Hacker, MD 12/12/2013, 7:17 AM PGY-1, [place] Intern pager: [phone], text pages welcome

## 2013-12-12 NOTE — Progress Notes (Signed)
CSW (Clinical Education officer, museum) called in for SNF auth to [employer] representative. CSW was informed they are unsure if pt qualifies but will review chart and notify CSW. CSW to update family and complete assessment as CSW becomes aware if authorization will be granted.  [name], [place]

## 2013-12-12 NOTE — Progress Notes (Signed)
FMTS Attending Note  I personally saw and evaluated the patient. The plan of care was discussed with the resident team. I agree with the assessment and plan as documented by the resident.   68 y/o female with Parkinson's disease admitted with acute delirium/encephalopathy secondary to Ambien/narcotic use, how back to baseline mental status, patient agreeable to short term stay at [facility] for physical therapy  Dossie Arbour MD

## 2013-12-13 DIAGNOSIS — F1910 Other psychoactive substance abuse, uncomplicated: Secondary | ICD-10-CM

## 2013-12-13 LAB — URINE CULTURE: Colony Count: >=100000

## 2013-12-13 LAB — GLUCOSE, CAPILLARY
Glucose-Capillary: 98 mg/dL (ref 70–99)
Glucose-Capillary: 119 mg/dL — ABNORMAL HIGH (ref 70–99)

## 2013-12-13 NOTE — Progress Notes (Signed)
CALL PAGER [phone] for any questions or notifications regarding this patient   FMTS  Note: Continuity patient Mallory Mcmurray MD  Attending [phone]  office [phone]  I  have seen and examined this patient, reviewed their chart. I have discussed this patient with the resident and the attending physician for the FMTS service.. I agree with the  assessment and care plan. I saw [name] both yesterday and today and discussed with her family and with Dr.  Ree Kida. She admits to mis-using her medications. On discussion with her daughter it appears that she has been prescribed ambien from her psychiatrist (Dr. Judieth Keens?) and that on one occasion she took several pills. She denies suicidal intention. Says she was just trying to "not feel so bad" for a while. Her daughter also reports that of her hydrocodone rx given to her by me recently, she has used way more pills than she should have by the directions listed. Her daughter is going to get back w me re the fulll contact info for the psychaiatrist. I want  To make sure he is aware of these issues. She (daughter)  is also going to confiscate the hydrocodone medicine for now.   I agree with SNF placement for some rehab as she seems quite debilitated---reports she has mostly been in the bed recently,

## 2013-12-13 NOTE — Discharge Summary (Signed)
FMTS Attending Note  I personally saw and evaluated the patient. The plan of care was discussed with the resident team. I agree with the assessment and plan as documented by the resident.   It was determined the patient was not a candidate for SNF placement from her insurance company. Patient to go home with home health care and physical therapy.  Dossie Arbour MD

## 2013-12-13 NOTE — Progress Notes (Signed)
CSW (Clinical Education officer, museum) notified RNCM that pt insurance informed CSW that pt does not look appropriate for SNF level care. RNCM to coordinate home health services for pt to dc home. CSW paged MD to notify.  [place], [place]

## 2013-12-13 NOTE — Progress Notes (Signed)
Patient is being discharged home. Patient has been provided with discharge instructions. RN went over instructions with the patient and her husband. She will be transported home by her husband

## 2013-12-13 NOTE — Discharge Instructions (Signed)
Discharge Date: 12/11/2013  Reason for Hospitalization:  Chest Pain Altered Mental Status    New medications:  No new medications  Discontinue: Norco, and discontinue Ambien.   You have been hospitalized for confusion and for chest pain. If you begin experiencing any of these symptoms again, then please return to the ED to be evaluated.   Thank you for letting us participate in your care!

## 2013-12-13 NOTE — Progress Notes (Signed)
Physical Therapy Treatment Patient Details Name: [name] MRN: [id number] DOB: [birth year] Today's Date: 12/13/2013    History of Present Illness [name] is a 68 y.o. female presenting with chest pain. Additionally, pt. has had Altered mental status, bowel and bladder incontinence, and hallucinations as well as drug seeking / odd behavior noted by her family members. PMH is significant for DMII, MDD, Esophageal Stricture, HLD, Parkinson's Disease, HTN, Anxiety    PT Comments    Pt pleasantly confused. Pt unaware of date or situation, difficulty with problem solving and decreased memory who required 24hr supervision and guarding with mobility due to cognition and balance deficits. Pt states she is scared she won't be able to take care of herself or 13yo son. Will continue to follow to maximize balance, activity and cognition.   Follow Up Recommendations  SNF;Supervision/Assistance - 24 hour     Equipment Recommendations       Recommendations for Other Services       Precautions / Restrictions Precautions Precautions: Fall    Mobility  Bed Mobility Overal bed mobility: Modified Independent                Transfers Overall transfer level: Needs assistance   Transfers: Sit to/from Stand Sit to Stand: Supervision         General transfer comment: standby for safety; cues to lock chair before sitting  Ambulation/Gait Ambulation/Gait assistance: Min guard Ambulation Distance (Feet): 500 Feet Assistive device: None Gait Pattern/deviations: Step-through pattern;Decreased stride length   Gait velocity interpretation: Below normal speed for age/gender General Gait Details: pt with one significant LOB during gait with min assist to correct but remainder of walk min guard. Pt unable to change gt speed, was able to look right and left withotu LOB but difficulty with looking up   Stairs            Wheelchair Mobility    Modified Rankin (Stroke  Patients Only)       Balance Overall balance assessment: Needs assistance   Sitting balance-Leahy Scale: Good       Standing balance-Leahy Scale: Fair                      Cognition Arousal/Alertness: Awake/alert Behavior During Therapy: Flat affect Overall Cognitive Status: Impaired/Different from baseline Area of Impairment: Safety/judgement;Following commands;Memory;Problem solving     Memory: Decreased short-term memory Following Commands: Follows one step commands consistently Safety/Judgement: Decreased awareness of safety   Problem Solving: Slow processing;Requires verbal cues General Comments: Pt even with directional signs and reminder of room number unable to find room with mod cues pt walked passed room x 3 before finding it with cues    Exercises General Exercises - Lower Extremity Long Arc Quad: AROM;Seated;Both;15 reps Hip Flexion/Marching: AROM;Seated;Both;15 reps Toe Raises: AROM;Seated;Both;15 reps    General Comments        Pertinent Vitals/Pain Pain Assessment: No/denies pain    Home Living                      Prior Function            PT Goals (current goals can now be found in the care plan section) Progress towards PT goals: Progressing toward goals    Frequency       PT Plan Current plan remains appropriate    Co-evaluation             End of Session Equipment Utilized During  Treatment: Gait belt Activity Tolerance: Patient tolerated treatment well Patient left: in chair;with call bell/phone within reach;with chair alarm set     Time: 5093-2671 PT Time Calculation (min): 26 min  Charges:  $Gait Training: 8-22 mins $Physical Performance Test: 8-22 mins                    G Codes:      Melford Aase Dec 24, 2013, 11:43 AM Elwyn Reach, [place]

## 2013-12-14 ENCOUNTER — Telehealth: Payer: Self-pay | Admitting: Family Medicine

## 2013-12-14 ENCOUNTER — Other Ambulatory Visit: Payer: Self-pay | Admitting: *Deleted

## 2013-12-14 NOTE — Telephone Encounter (Signed)
Husband called and wants a refill on Prednisone for his wife.Please call when ready. jw

## 2013-12-14 NOTE — Telephone Encounter (Signed)
After discussion with her PCP she may find benefit from evaluation in geriatric clinic. There are openings on 9/15 and 9/22.   Will ask staff to help set this up.   Laroy Apple, MD [place] Resident, PGY-3 12/14/2013, 12:07 PM

## 2013-12-15 ENCOUNTER — Telehealth: Payer: Self-pay | Admitting: Family Medicine

## 2013-12-15 MED ORDER — PREDNISONE 20 MG PO TABS: ORAL_TABLET | ORAL | Status: DC

## 2013-12-15 NOTE — Telephone Encounter (Signed)
[name] from [facility] called and would like verbal orders for skilled nursing to help with managing her medications and also speech therapy. Patient is resisting help but does need it. The patient is having trouble understanding her medications. Please call [name] at [phone]. Blima Rich

## 2013-12-15 NOTE — Telephone Encounter (Signed)
Gave verbal orders. 

## 2013-12-15 NOTE — Telephone Encounter (Signed)
Relayed message,patient voiced understanding. Shawny Borkowski S  

## 2013-12-15 NOTE — Telephone Encounter (Signed)
Continued arthralgias and we have discontinued her pain meds so wlll try short burst of prednisone.  Dear [name] Team This is called in [facility]! [name]

## 2013-12-16 ENCOUNTER — Encounter: Payer: Self-pay | Admitting: Family Medicine

## 2013-12-16 ENCOUNTER — Ambulatory Visit (INDEPENDENT_AMBULATORY_CARE_PROVIDER_SITE_OTHER): Payer: Commercial Managed Care - HMO | Admitting: Family Medicine

## 2013-12-16 VITALS — BP 110/60 | HR 68 | Ht 64.0 in | Wt 182.1 lb

## 2013-12-16 DIAGNOSIS — F339 Major depressive disorder, recurrent, unspecified: Secondary | ICD-10-CM

## 2013-12-16 DIAGNOSIS — Z23 Encounter for immunization: Secondary | ICD-10-CM

## 2013-12-16 DIAGNOSIS — E119 Type 2 diabetes mellitus without complications: Secondary | ICD-10-CM

## 2013-12-16 DIAGNOSIS — E7800 Pure hypercholesterolemia, unspecified: Secondary | ICD-10-CM

## 2013-12-16 DIAGNOSIS — M479 Spondylosis, unspecified: Secondary | ICD-10-CM

## 2013-12-16 DIAGNOSIS — I10 Essential (primary) hypertension: Secondary | ICD-10-CM

## 2013-12-16 MED ORDER — ASPIRIN 81 MG PO TABS: 81.0000 mg | ORAL_TABLET | Freq: Every day | ORAL | Status: DC

## 2013-12-16 MED ORDER — ATORVASTATIN CALCIUM 40 MG PO TABS: 40.0000 mg | ORAL_TABLET | Freq: Every day | ORAL | Status: DC

## 2013-12-16 NOTE — Assessment & Plan Note (Signed)
Good control

## 2013-12-16 NOTE — Assessment & Plan Note (Signed)
Start statin for diabetic

## 2013-12-16 NOTE — Patient Instructions (Signed)
I am glad things are clearing up.  I hope they continue to clear up over the next couple of weeks. You will get a flu shot today See Dr. Nori Riis soon - she knows you best Start taking the gabapentin as prescribed and see if it helps your back pain Start taking an 81 mg baby aspirin every day to prevent heart attack and stroke. I sent in one new prescription for a statin (atorvastatin) again to prevent heart attack and stroke. Good news is the diabetes is under good control.

## 2013-12-16 NOTE — Progress Notes (Signed)
   Subjective:    Patient ID: [name], female    DOB: 16-Dec-1945, 68 y.o.   MRN: [id number]  HPI FU hospitalization for delirium due to polypharmacy.  Also had chest pain, UTI and is a diabetic Delirium - mentally continues to clear.  Family (husband and daughter) are pleased with her mental status. No further chest pain.  Has cards FU.  Not on ASA and LDL up for diabetic UTI, culture confirms sensative to Keflex.     Review of Systems     Objective:   Physical Exam Oriented x 3 and answers all questions clearly Lungs clear Cardiac RRR without m or g Abd benign.       Assessment & Plan:

## 2013-12-16 NOTE — Assessment & Plan Note (Signed)
Good control although may want to consider to add ACE given DM

## 2013-12-16 NOTE — Assessment & Plan Note (Signed)
Start gabapentin for back pain - she has not been taking.

## 2013-12-16 NOTE — Assessment & Plan Note (Signed)
Stable even as she clears her polypharmacy

## 2013-12-21 ENCOUNTER — Encounter: Payer: Self-pay | Admitting: Family Medicine

## 2013-12-21 ENCOUNTER — Telehealth: Payer: Self-pay | Admitting: *Deleted

## 2013-12-21 ENCOUNTER — Ambulatory Visit (INDEPENDENT_AMBULATORY_CARE_PROVIDER_SITE_OTHER): Payer: Commercial Managed Care - HMO | Admitting: Family Medicine

## 2013-12-21 VITALS — BP 116/62 | HR 74 | Ht 64.0 in | Wt 182.0 lb

## 2013-12-21 DIAGNOSIS — E119 Type 2 diabetes mellitus without complications: Secondary | ICD-10-CM

## 2013-12-21 DIAGNOSIS — L501 Idiopathic urticaria: Secondary | ICD-10-CM

## 2013-12-21 DIAGNOSIS — G20 Parkinson's disease: Secondary | ICD-10-CM

## 2013-12-21 DIAGNOSIS — N390 Urinary tract infection, site not specified: Secondary | ICD-10-CM | POA: Insufficient documentation

## 2013-12-21 DIAGNOSIS — M479 Spondylosis, unspecified: Secondary | ICD-10-CM

## 2013-12-21 HISTORY — DX: Idiopathic urticaria: L50.1

## 2013-12-21 LAB — POCT URINALYSIS DIPSTICK
Bilirubin, UA: NEGATIVE
Blood, UA: NEGATIVE
Glucose, UA: NEGATIVE
Nitrite, UA: NEGATIVE
Spec Grav, UA: 1.025
Urobilinogen, UA: 0.2
pH, UA: 5.5

## 2013-12-21 LAB — POCT UA - MICROSCOPIC ONLY: WBC, Ur, HPF, POC: >20

## 2013-12-21 MED ORDER — PREDNISONE 5 MG PO TBEC: DELAYED_RELEASE_TABLET | ORAL | Status: DC

## 2013-12-21 NOTE — Patient Instructions (Signed)
Increase the gabapentin to one tablet in the morning, one tablet midday, and 2 tablets at night. See if this helps to sleep. Decrease the metformin back to once daily rather than 2 a day. For the itching I'm going to put 2 back on a very low dose prednisone and try to get to into see the dermatologist. Take one tablet of prednisone daily for the next 3-4 weeks and please see the dermatologist. We are going to recheck her urine today to make sure the urinary tract infection has cleared up. Like to see you back in about a month.

## 2013-12-21 NOTE — Telephone Encounter (Signed)
Pt called asking to be seen sooner per pcp, due to her medications.  Offered Friday, not able to come.  Monday at 0830 with Dr. Leta Baptist.  She verbalized understanding.

## 2013-12-21 NOTE — Assessment & Plan Note (Signed)
I wonder if her urticarial rash is related to her Parkinson's medicine. She will get an appointment with her neurologist in the next few days. I'll see her back in one month.

## 2013-12-21 NOTE — Assessment & Plan Note (Signed)
Pretty good control from her gabapentin for her arthritis. Will increase the dose at night to see if we can also improve her sleep

## 2013-12-21 NOTE — Assessment & Plan Note (Signed)
Unclear why they increased her metformin dosage when her A1c was 6.6. We'll take it back down to once daily

## 2013-12-21 NOTE — Progress Notes (Signed)
   Subjective:    Patient ID: [name], female    DOB: Aug 16, 1945, 68 y.o.   MRN: [id number]  HPI #1. Followup urticarial skin rash. He a little bit better with the prednisone. Now that she stopped that, she's having return of the itching lesions. Really bothers her at night. #2. Has completed her antibiotics for urinary tract infection and has no symptoms. #3. Difficulty sleeping at night. We had essentially stopped her pain medicine for her knees secondary to her recent hospitalization where we determine she had been is using some of her medications. I have asked her daughter to take her hydrocodone prescription to her house and monitor her any that she would use. She's not used in the. She's intact in the gabapentin 3 times a day which helps during the daytime for her back in knee pain at night she still having difficulty sleeping #4. We increased her metformin upon her discharge from the hospital she's having some increased diarrhea.   Review of Systems No fever, sweats, chills or unusual weight change. Some diarrhea. No burning on urination, no urinary frequency. Confusion is much improved    Objective:   Physical Exam  Vital signs reviewed. GENERAL: Well-developed, well-nourished, no acute distress. CARDIOVASCULAR: Regular rate and rhythm no murmur gallop or rub LUNGS: Clear to auscultation bilaterally, no rales or wheeze. ABDOMEN: Soft positive bowel sounds NEURO: Resting tremor hands and hand. Cogwheel rigidity that is mild. Wide-based gait. MSK: Movement of extremity x 4. PSYCHIATRIC: Alert and oriented x4. Good eye contact. Speech is normal content. Some occasional stuttering which is secondary to her Parkinson's.        Assessment & Plan:

## 2013-12-21 NOTE — Assessment & Plan Note (Signed)
I wonder if this is related to her Parkinson's medicine. We'll have her see dermatology. I will suppressor with 5 mg of prednisone daily for symptom relief until she could be seen by dermatology

## 2013-12-22 ENCOUNTER — Telehealth: Payer: Self-pay | Admitting: *Deleted

## 2013-12-22 LAB — URINE CULTURE: Colony Count: >=100000

## 2013-12-22 NOTE — Telephone Encounter (Signed)
[name] told her she needed drs office to call make an appt

## 2013-12-22 NOTE — Telephone Encounter (Signed)
Left message to return call. Please tell patient to call [facility] at [phone] for an appointment. I have already faxed over the referral to their office.Busick, Kevin Fenton

## 2013-12-22 NOTE — Telephone Encounter (Signed)
Called patient and informed her of appointment with Dr Allyson Sabal on 12/26/13 at [place]. Patient states she already has an appointment on that date and time and will need to reschedule. I told her she will need to contact them to reschedule.Busick, Kevin Fenton

## 2013-12-22 NOTE — Telephone Encounter (Signed)
Pt informed

## 2013-12-26 ENCOUNTER — Ambulatory Visit (INDEPENDENT_AMBULATORY_CARE_PROVIDER_SITE_OTHER): Payer: Commercial Managed Care - HMO | Admitting: Diagnostic Neuroimaging

## 2013-12-26 ENCOUNTER — Encounter: Payer: Self-pay | Admitting: Diagnostic Neuroimaging

## 2013-12-26 VITALS — BP 121/66 | HR 59 | Ht 64.0 in | Wt 183.8 lb

## 2013-12-26 DIAGNOSIS — G20 Parkinson's disease: Secondary | ICD-10-CM

## 2013-12-26 DIAGNOSIS — F411 Generalized anxiety disorder: Secondary | ICD-10-CM

## 2013-12-26 MED ORDER — CARBIDOPA-LEVODOPA 25-100 MG PO TABS: 2.0000 | ORAL_TABLET | Freq: Three times a day (TID) | ORAL | Status: DC

## 2013-12-26 NOTE — Patient Instructions (Signed)
Gradually increase carb/levo to 2 tabs three times per day (take 30 minutes before meals). Make small adjustment and see how you do over 2 weeks, before further increase.  Follow up with Dr. Casimiro Needle re: anxiety/insomnia.

## 2013-12-26 NOTE — Progress Notes (Signed)
[facility]  PATIENT: [name] DOB: 1946-03-11  REFERRING CLINICIAN:  HISTORY FROM: patient and husband REASON FOR VISIT: follow up   HISTORICAL  CHIEF COMPLAINT:  Chief Complaint  Patient presents with  . Parkinson's Disease    RV, sooner appt per PCP, Rm 7    HISTORY OF PRESENT ILLNESS:   UPDATE 12/26/13: Since last visit, was hospitalized (2 weeks ago) for delirium, hallucinations, chest pain, UTI, and medication overuse. Was taking ambien and a friend's xanax to help her sleep (was struggling with anxiety and insomnia; not seen Dr. Casimiro Needle in a while). Now back at home and doing better. Tremor and gait are worsening. Some wearing off (early AM and late evening). Also with 2 weeks are night time itching and rash. Going to see dermatology soon.   UPDATE 01/18/13: Since last visit, now on carb/levo 1.5 tabs TID. Nausea is improved since she started taking her metformin at different time than carb/levo. Tremor, balance, coordination are stable.  UPDATE 06/01/12: Since last visit, tried carb/levo 1.5 tabs TID x 1 week, then stopped. Felt like it was too much medicine. Did not have increased side effects. Has been struggling with nausea throughout. Tremor is persistent.  UPDATE 01/28/12: Doing well. No wearing off. Tolerating meds. Stopped azilect (per PCP for nausea). Now on fluoxetine for depression.  UPDATE 08/27/10: Doing better on carb/levo.  Taking 1 tab TID (6am, 4pm, 9pm).  Minimal nausea.  Wakes up with more tremor in AM, then reduction in tremor 98min after dose.  Effect wears off around 4pm.  Daughter reports some intermittent confusion.  PRIOR HPI (06/26/10): 68 yo Caucasian female referred to [place] for tremor with concern for possible Parkinson's disease. [name] notes she first noticed a resting tremor in her left hand about 3 years ago, and this has gotten progressively worse since then. It has also spread to now involve her mouth and right  hand as well, though she notes it is still worst in her left hand. Stress and anxiety can accentuate the tremors, whereas active use can reduce them. She also notes the tremors being worse in the morning. She denies noting anything else that seems to make the tremors better or worse. She admits to what seems like possible bradykinesia, in her words that she has "a hard time getting going sometimes," but denies any freezing. She does note some new difficulty with writing, but denies micrographia. She also admits to feeling like her balance and walking is "a bit off," but she relates this more to the osteoarthritis in her knees and having had surgery on her R knee. She denies orthostatic symptoms, hallucinations or delusions, difficulty standing or sitting, weakness, new visual changes (aside from her macular degeneration), or feelings of rigidity. She also denies bizarre dreams, nightmares, RLS symptoms, or REM behavior disorder symptoms. She admits to having two uncles who have tremors, etiology unclear, but adds that one uncle had alcoholism (and it was believed this was the cause). She is concerned about what is causing her tremors, and admits that although she initially "put off" being evaluated further, she is anxious to know what might be the cause of her tremors.  REVIEW OF SYSTEMS: Full 14 system review of systems performed and notable only for sweating runny nose trouble swallowing diarrhea incontinence freq waking back pain depression anxiety.   ALLERGIES: Allergies  Allergen Reactions  . Ace Inhibitors Anaphylaxis and Swelling  . Lisinopril Swelling    Severe facial angioedema  requiring hospitalization 2007 (approx)  . Azilect [Rasagiline Mesylate] Other (See Comments)    hypotension    HOME MEDICATIONS: Outpatient Prescriptions Prior to Visit  Medication Sig Dispense Refill  . aspirin 81 MG tablet Take 1 tablet (81 mg total) by mouth daily.  30 tablet    . atorvastatin (LIPITOR) 40 MG  tablet Take 1 tablet (40 mg total) by mouth daily.  90 tablet  3  . FLUoxetine (PROZAC) 40 MG capsule Take 40 mg by mouth daily.      Marland Kitchen gabapentin (NEURONTIN) 300 MG capsule Take 300 mg by mouth 3 (three) times daily.      . hydrocortisone 2.5 % lotion Apply topically 2 (two) times daily as needed.  59 mL  0  . metFORMIN (GLUCOPHAGE) 850 MG tablet Take 850 mg by mouth daily with breakfast.       . omeprazole (PRILOSEC) 40 MG capsule Take 40 mg by mouth daily.       . PredniSONE 5 MG TBEC Take one by mouth daily  30 tablet  0  . propranolol ER (INDERAL LA) 80 MG 24 hr capsule Take 1 capsule (80 mg total) by mouth daily.  90 capsule  3  . carbidopa-levodopa (SINEMET) 25-100 MG per tablet Take 1.5 tablets by mouth 3 (three) times daily.       No facility-administered medications prior to visit.    PAST MEDICAL HISTORY: Past Medical History  Diagnosis Date  . Allergy   . Anxiety   . Arthritis   . Depression   . Diabetes mellitus   . Hyperlipidemia   . Movement disorder   . Hypertension   . Scoliosis   . Esophageal stricture   . Osteoarthritis   . Cognitive impairment 12/11/2013    PAST SURGICAL HISTORY: Past Surgical History  Procedure Laterality Date  . Total knee arthroplasty      right  . Esophagogastric fundoplasty      some type "esoph surgery" per pt  . Colon surgery    . Abdominal hysterectomy      FAMILY HISTORY: Family History  Problem Relation Age of Onset  . Cerebral palsy Son   . Heart disease Mother   . Diabetes Mother   . Heart disease Father     SOCIAL HISTORY:  History   Social History  . Marital Status: Married    Spouse Name: [name]    Number of Children: 2  . Years of Education: 14   Occupational History  . Retired    Social History Main Topics  . Smoking status: Never Smoker   . Smokeless tobacco: Never Used  . Alcohol Use: No  . Drug Use: No  . Sexual Activity: Not on file   Other Topics Concern  . Not on file   Social History  Narrative   Health Care POA:    Emergency Contact: daughter, [name], [phone] husband, [name] [phone]   End of Life Plan:   Who lives with you: Lives with husband and son, [name]   Any pets: Rabbit, [name]   Diet: Patient has a varied diet but struggles with what to eat with hypertension, diabetes, parkinsons   Exercise: Patient does not have any regular exercise routine.   Seatbelts: Patient reports wearing her seatbelt when in vehicle.   Nancy Fetter Exposure/Protection: Patient reports wearing sun block lotion daily.   Hobbies: Watching game shows, writing poetry, writing in journal, volunteering at day program with son.    Caffeine Use: very little occasional  PHYSICAL EXAM  Filed Vitals:   12/26/13 0843  BP: 121/66  Pulse: 59  Height: 5\' 4"  (1.626 m)  Weight: 183 lb 12.8 oz (83.371 kg)    Not recorded    Body mass index is 31.53 kg/(m^2).  GENERAL EXAM: General: Patient is awake, alert and in no acute distress.  Well developed and groomed. Neck: Neck is supple. Cardiovascular: No carotid artery bruits.  Heart is regular rate and rhythm with no murmurs.  Neurologic Exam  Mental Status: Awake, alert. Language is fluent and comprehension intact. Cranial Nerves: Pupils are equal and reactive to light.  Visual fields are full to confrontation.  Conjugate eye movements are full and symmetric.  Facial sensation and strength are symmetric.  Hearing is intact.  Palate elevated symmetrically and uvula is midline.  Shoulder shrug is symmetric.  Tongue is midline. OROLINGUAL DYSKINESIAS. Motor: INTERMITTENT RESTING TREMOR OF BUE AND MOUTH. MINIMAL POSTURAL TREMOR. BRADYKINESIA AND RIGIDITY IN LUE>RUE, LLE>RLE. Normal bulk. Full strength in the upper and lower extremities.  No pronator drift. Sensory: Intact and symmetric to light touch. Coordination: No ataxia or dysmetria on finger-nose or rapid alternating movement testing. Reflexes: BUE and BLE present and  symmetric Gait and Station: Narrow based gait. DECR ARM SWING.   DIAGNOSTIC DATA (LABS, IMAGING, TESTING) - I reviewed patient records, labs, notes, testing and imaging myself where available.  Lab Results  Component Value Date   WBC 9.6 12/10/2013   HGB 12.3 12/10/2013   HCT 36.4 12/10/2013   MCV 94.3 12/10/2013   PLT 268 12/10/2013      Component Value Date/Time   NA 137 12/10/2013 1549   K 3.6* 12/10/2013 1549   CL 99 12/10/2013 1549   CO2 22 12/10/2013 1549   GLUCOSE 127* 12/10/2013 1549   BUN 15 12/10/2013 1549   CREATININE 1.11* 12/10/2013 2212   CREATININE 1.03 10/06/2012 1025   CALCIUM 9.1 12/10/2013 1549   PROT 7.4 12/10/2013 1549   ALBUMIN 3.4* 12/10/2013 1549   AST 16 12/10/2013 1549   ALT <5 12/10/2013 1549   ALKPHOS 77 12/10/2013 1549   BILITOT 0.8 12/10/2013 1549   GFRNONAA 50* 12/10/2013 2212   GFRAA 58* 12/10/2013 2212   Lab Results  Component Value Date   CHOL 191 12/11/2013   HDL 31* 12/11/2013   LDLCALC 123* 12/11/2013   LDLDIRECT 151* 10/06/2012   TRIG 184* 12/11/2013   CHOLHDL 6.2 12/11/2013   Lab Results  Component Value Date   HGBA1C 6.6* 12/10/2013   Lab Results  Component Value Date   VITAMINB12 301 01/05/2008   Lab Results  Component Value Date   TSH 0.425 09/26/2010    07/05/10 MRI BRAIN - minimal scattered chronic small vessel ischemic disease   ASSESSMENT AND PLAN  68 y.o. female with resting tremor of BUE, cogwheel rigidity, bradykinesia, decreased arm swing, anxiety, diff swallowing, memory loss.   Dx: parkinson's disease  PLAN: 1. Gradually increase carb/levo to 2 tabs TID (of note, I don't think her rash, itching are related to this medicine, as the symptoms are very recent, and she has been on carb/levo for a long time) 2. Follow up with PCP and psychiatry re: anxiety/insomnia: if patient is transitioned off of Prozac and switched to citalopram or sertraline, then we may consider using Azilect in the future for better parkinson's control 3.  Continue home health PT/OT/ST  Return in about 3 months (around 03/27/2014).    Penni Bombard, MD 7/32/2025, 4:27 AM Certified in Neurology, Neurophysiology  and Neuroimaging  [facility] [address], [place], [place] [address] [phone]

## 2013-12-28 ENCOUNTER — Ambulatory Visit: Payer: Commercial Managed Care - HMO | Admitting: Family Medicine

## 2014-01-02 ENCOUNTER — Telehealth: Payer: Self-pay | Admitting: Family Medicine

## 2014-01-02 NOTE — Telephone Encounter (Signed)
Speech therapist w/ [facility]: Pt told therapist she had a bad night last night---no sleep and has broken out in hives today Her BP 172/98. Needs verbal orders to continue with speech therapy

## 2014-01-02 NOTE — Telephone Encounter (Signed)
LVM giving verbal orders on confidential line

## 2014-01-05 ENCOUNTER — Telehealth: Payer: Self-pay | Admitting: Family Medicine

## 2014-01-05 NOTE — Telephone Encounter (Signed)
Pt would like to speak to a nurse/MD about her meds. Pls call pt. Asking to speak to [name].

## 2014-01-06 NOTE — Telephone Encounter (Signed)
Patient states that her daughter threw away her pain medication ans that she needs a refill on it. I asked if she knew which one and she did not. i seen and asked her about gabapentin, but she states that is not the pain medication she is talking.

## 2014-01-09 NOTE — Telephone Encounter (Signed)
LMOVM for pt to return call .Fleeger, Jessica Dawn  

## 2014-01-09 NOTE — Telephone Encounter (Signed)
Dear [name] Team She will need to call her daughter as I don't thik her daughter threw it away--the agreement was for her daughter to keep it and let Mom have it as she needed it. See if she remembers this. [name] [facility]!

## 2014-01-12 ENCOUNTER — Encounter: Payer: Commercial Managed Care - HMO | Admitting: Internal Medicine

## 2014-01-18 ENCOUNTER — Ambulatory Visit (INDEPENDENT_AMBULATORY_CARE_PROVIDER_SITE_OTHER): Payer: Commercial Managed Care - HMO | Admitting: Family Medicine

## 2014-01-18 ENCOUNTER — Encounter: Payer: Self-pay | Admitting: Family Medicine

## 2014-01-18 VITALS — BP 118/59 | HR 76 | Temp 98.1°F | Ht 64.0 in | Wt 181.9 lb

## 2014-01-18 DIAGNOSIS — M150 Primary generalized (osteo)arthritis: Secondary | ICD-10-CM

## 2014-01-18 DIAGNOSIS — G20A1 Parkinson's disease without dyskinesia, without mention of fluctuations: Secondary | ICD-10-CM

## 2014-01-18 DIAGNOSIS — M159 Polyosteoarthritis, unspecified: Secondary | ICD-10-CM

## 2014-01-18 DIAGNOSIS — F1910 Other psychoactive substance abuse, uncomplicated: Secondary | ICD-10-CM

## 2014-01-18 DIAGNOSIS — IMO0002 Reserved for concepts with insufficient information to code with codable children: Secondary | ICD-10-CM

## 2014-01-18 DIAGNOSIS — G20 Parkinson's disease: Secondary | ICD-10-CM

## 2014-01-18 DIAGNOSIS — M8949 Other hypertrophic osteoarthropathy, multiple sites: Secondary | ICD-10-CM

## 2014-01-18 DIAGNOSIS — F331 Major depressive disorder, recurrent, moderate: Secondary | ICD-10-CM

## 2014-01-18 MED ORDER — HYDROCODONE-ACETAMINOPHEN 5-325 MG PO TABS: ORAL_TABLET | ORAL | Status: DC

## 2014-01-19 NOTE — Assessment & Plan Note (Signed)
Her psychiatrist has changed her from fluoxetine to sertraline so that her neurologist may retry the [place].

## 2014-01-19 NOTE — Assessment & Plan Note (Signed)
Very difficult situation with chronic pain associated with her osteoarthritis of low back and knee as well as her progressing Parkinson's. I discussed this at length with her and her husband. He agreed to be the Holder of the pain medicine. I gave him a prescription which I hope will last 2 months and have instructed them with that. Do not want to have her taking the maximum dose every day if possible. I'll see her back in 2 months we'll go from there.

## 2014-01-19 NOTE — Progress Notes (Signed)
   Subjective:    Patient ID: [name], female    DOB: November 11, 1945, 68 y.o.   MRN: [id number]  HPI Here with her husband to discuss pain medicine. At last hospitalization I had discussed with her, her daughter and her husband that her daughter would hold or take control of her Vicodin. Since then evidently her daughter has decided she does not really want to do this and [name] husband who is here with her today is now willing to take controlled that. Her pain is mostly in her low back and knee. She has had one total knee replacement. She does not think she's up to having a second TKR right now secondary to her Parkinson's. She also thinks the Parkinson's is causing increasing her back pain because she is having so much stifnessf. Physical therapist is seen her currently 3 times a week and has provided her with an exercise program to do 3 times a day with stretching exercises. She's doing this fairly regularly. Even so she's very stiff typically in the neck.  #2. Parkinson's and #3 urticaria:. She follows with the neurologist who is trying to increase her Sinemet. She is having some side effects but trying to push through those. He did not think her urticaria was related to the Sinemet and the dermatologist was unclear as well. Her urticaria continues but is less bothersome, the lesions last for less of the day and she's having less problems with itching. She's been taking the antihistamine regularly.   Review of Systems Positive for increasing stiffness, low back pain, knee pain, increasing problems with tremor and balance. Negative for suicidal or homicidal ideation, continues to have some mild to moderate depressive symptoms but says they're better than prior to her hospitalization. She is getting up and out of the bed pretty much every day and doing things.    Objective:   Physical Exam Vital signs are reviewed GENERAL: Well-developed female, no acute distress MSK: Cogwheel rigidity  noted specifically in the upper extremities. She can rise from a chair without additional assistance but it is difficult. Her gait is short stride, slightly stooped posture.  NEURO: Resting tremor.  PSYCHIATRIC: Alert and oriented x4. Asks and answers questions appropriately. Speech is normal in content and fluency. No hallucinations.       Assessment & Plan:  Greater than 50% of our 45 minute office visit was spent in counseling and education regarding these issues.

## 2014-01-19 NOTE — Assessment & Plan Note (Signed)
Followed by neurology. He is increasing her Sinemet. He has discussed with her psychiatrist changing her from fluoxetine to sertraline so that in the future he may retry the Azilect. Did mention to her and to her husband that she should remind him to issue with the Azilect was that she presented here with fairly significant hypertension.

## 2014-02-03 ENCOUNTER — Telehealth: Payer: Self-pay | Admitting: Nurse Practitioner

## 2014-02-03 ENCOUNTER — Ambulatory Visit: Payer: 59 | Admitting: Nurse Practitioner

## 2014-02-03 NOTE — Telephone Encounter (Signed)
Patient was no show for today's office appointment.  

## 2014-02-08 ENCOUNTER — Telehealth: Payer: Self-pay | Admitting: Family Medicine

## 2014-02-08 NOTE — Telephone Encounter (Signed)
Patient would like prednisone called in due to her breaking out in hives at night. States that she cannot get in to the dermatologist any time soon. States that the cream prescribed does not help.

## 2014-02-08 NOTE — Telephone Encounter (Signed)
Pt requesting to speak with Dr. Nori Riis or [name] about hives she has a on her hands and arms. Please call patient.

## 2014-02-09 ENCOUNTER — Other Ambulatory Visit: Payer: Self-pay | Admitting: *Deleted

## 2014-02-10 MED ORDER — METFORMIN HCL 850 MG PO TABS: 850.0000 mg | ORAL_TABLET | Freq: Every day | ORAL | Status: DC

## 2014-02-15 ENCOUNTER — Encounter: Payer: Self-pay | Admitting: Internal Medicine

## 2014-02-21 ENCOUNTER — Telehealth: Payer: Self-pay | Admitting: Family Medicine

## 2014-02-21 NOTE — Telephone Encounter (Signed)
Wants to have [name] call her about her medications. Says she has a question about what she can take with her existing medications. Also she wants pain med refills Says she has been calling for 2 weeks and nobody has called her back

## 2014-02-22 NOTE — Telephone Encounter (Signed)
LVM for patient to call back. ?

## 2014-02-23 NOTE — Telephone Encounter (Signed)
LVM for patient to call back to inform her of below

## 2014-02-23 NOTE — Telephone Encounter (Signed)
Dear [name] Team It is TOO EARLY for a pin med refill--I gacve her #120 and that was to last TWO MONTHS. THANKS! [name]

## 2014-02-23 NOTE — Telephone Encounter (Signed)
Pt called back to talk to [name] about her medications. SHe is also needing a refill on her hydrocodone. jw

## 2014-02-24 NOTE — Telephone Encounter (Signed)
LVM for patient to call me back 

## 2014-02-27 ENCOUNTER — Other Ambulatory Visit: Payer: Self-pay | Admitting: Family Medicine

## 2014-02-27 NOTE — Telephone Encounter (Signed)
Pt called and needs a refill on hr pain medication left up front. jw

## 2014-02-27 NOTE — Telephone Encounter (Signed)
Spoke with patient about the pain medication refill, it is a follow up from previous phone message. Please consult that message

## 2014-03-01 ENCOUNTER — Ambulatory Visit: Payer: Commercial Managed Care - HMO | Admitting: Family Medicine

## 2014-03-03 ENCOUNTER — Encounter: Payer: Self-pay | Admitting: Family Medicine

## 2014-03-03 ENCOUNTER — Ambulatory Visit (INDEPENDENT_AMBULATORY_CARE_PROVIDER_SITE_OTHER): Payer: Commercial Managed Care - HMO | Admitting: Family Medicine

## 2014-03-03 VITALS — BP 149/74 | HR 70 | Temp 98.5°F | Ht 64.0 in | Wt 189.0 lb

## 2014-03-03 DIAGNOSIS — E119 Type 2 diabetes mellitus without complications: Secondary | ICD-10-CM

## 2014-03-03 DIAGNOSIS — R1032 Left lower quadrant pain: Secondary | ICD-10-CM

## 2014-03-03 DIAGNOSIS — R300 Dysuria: Secondary | ICD-10-CM

## 2014-03-03 DIAGNOSIS — R109 Unspecified abdominal pain: Secondary | ICD-10-CM

## 2014-03-03 LAB — POCT URINALYSIS DIPSTICK
Bilirubin, UA: NEGATIVE
Glucose, UA: NEGATIVE
Leukocytes, UA: NEGATIVE
Nitrite, UA: NEGATIVE
Protein, UA: NEGATIVE
Spec Grav, UA: 1.025
Urobilinogen, UA: 0.2
pH, UA: 5.5

## 2014-03-03 LAB — POCT UA - MICROSCOPIC ONLY

## 2014-03-03 LAB — POCT GLYCOSYLATED HEMOGLOBIN (HGB A1C): Hemoglobin A1C: 6.6

## 2014-03-03 NOTE — Patient Instructions (Signed)
Nice to see you. We are going to check a urine culture. We are also going to check a blood count. If your blood count is elevated we will give you antibiotics. If your pain worsens, you develop nausea, vomiting, worsening diarrhea, blood in your stool, or fever please seek medical attention.  If you are not better on Monday please return for a follow-up appointment.  If you worsen over the weekend please go to an urgent care or emergency room.

## 2014-03-03 NOTE — Progress Notes (Signed)
Patient ID: [name], female   DOB: [birth year], 68 y.o.   MRN: [id number]  Tommi Rumps, MD Phone: [phone]  [name] is a 68 y.o. female who presents today for same day appointment. Complaint is "bladder infection."  Pain started 5 days ago. States this is not so much when she urinates. She states this is what it felt like the last time she had a UTI. She notes LLQ and lower abdominal pain that is sharp and intermittent. It gets worse with getting up from a seated position. Pain is: intermittent Medications tried: none Any antibiotics in the last 30 days: no More than 3 UTIs in the last 12 months: no STD exposure: no Possibly pregnant: no  Symptoms Urgency: yes Frequency: 8x/day Blood in urine: no Pain in back: left low back is achey Fever: no Vaginal discharge: no Mouth Ulcers: no Patient notes she also has chronic diarrhea. She notes this got more watery about 5 days ago. There is no blood in it. The LLQ pain improves after having a BM. No vomiting. She has a history of a hysterectomy.  Review of Symptoms - see HPI PMH - Smoking status noted.     ROS: Per HPI   Physical Exam Filed Vitals:   03/03/14 1046  BP: 149/74  Pulse: 70  Temp: 98.5 F (36.9 C)    Gen: Well NAD HEENT: PERRL,  MMM Lungs: CTABL Nl WOB Heart: RRR no MRG Abd: soft, mild tenderness in LLQ and suprapubic area, no guarding or rebound, ND, no hernias palpated in inguinal region GU: normal labia, normal vaginal mucosa, no discharge noted, no cervix noted, discomfort with palpation of bilateral adenxal regions on bimanual exam Exts: Non edematous BL  LE, warm and well perfused.    Assessment/Plan: Please see individual problem list.  Tommi Rumps, MD [place] PGY-3

## 2014-03-03 NOTE — Assessment & Plan Note (Addendum)
Patient notes abdominal pain in the LLQ. Noted to have bilateral lower abdominal tenderness, though no peritoneal signs. This has been associated with change in her BMs, though no fever and her vital signs are stable. Differential includes UTI (less likely with no leukocytes or nitrites on UA, though possible with small amount of hgb on UA), diverticulitis (possible with LLQ pain and change in stools, though unable to confirm any history of diverticulosis as last colonoscopy is not available in epic), hernia (no evidence on exam, though with worsening of pain on standing this is a possibility), ovarian torsion (less likely as would expect pain to be more intense and lack of uterus) or muscle strain. Will send urine culture. CBC collected to evaluate for an elevated WBC. If elevated will treat as diverticulitis with cipro/flagyl. Will need to consider CT scan of abdomen if WBC is greatly elevated. Given return precautions. She is to follow-up on Monday if not improving.   Precepted with Dr Erin Hearing

## 2014-03-04 ENCOUNTER — Telehealth: Payer: Self-pay | Admitting: Family Medicine

## 2014-03-04 LAB — CBC
HCT: 39.6 % (ref 36.0–46.0)
Hemoglobin: 13.3 g/dL (ref 12.0–15.0)
MCH: 30.7 pg (ref 26.0–34.0)
MCHC: 33.6 g/dL (ref 30.0–36.0)
MCV: 91.5 fL (ref 78.0–100.0)
MPV: 11.1 fL (ref 9.4–12.4)
Platelets: 390 10*3/uL (ref 150–400)
RBC: 4.33 MIL/uL (ref 3.87–5.11)
RDW: 13.6 % (ref 11.5–15.5)
WBC: 11.9 10*3/uL — ABNORMAL HIGH (ref 4.0–10.5)

## 2014-03-04 NOTE — Telephone Encounter (Signed)
Called patient to discuss the results of her CBC. The WBC was mildly elevated at 11.9. The patient states she feels much better this morning. She denies any abdominal pain and has not had any diarrhea since she was seen in the office yesterday. I discussed the option of placing her on antibiotics to treat a potential infection given the symptoms from yesterday and her mildly elevated WBC and she decided that she did not want antibiotics at this time given that she feels improved. I believe this is reasonable given that she is currently asymptomatic. Given resolution of symptoms this could have potentially been related to a viral illness. I advised that if she were to develop recurrent symptoms of abdominal pain or diarrhea, or develop nausea, vomiting, or fever she would need to call our on call physician over the weekend for advice. Depending on the situation it may be reasonable to treat her for a diverticulitis if she has recurrent symptoms, though if they recur she may need to be re-evaluated at an urgent care.

## 2014-03-06 ENCOUNTER — Telehealth: Payer: Self-pay | Admitting: Family Medicine

## 2014-03-06 MED ORDER — CEPHALEXIN 500 MG PO CAPS: 500.0000 mg | ORAL_CAPSULE | Freq: Two times a day (BID) | ORAL | Status: DC

## 2014-03-06 NOTE — Telephone Encounter (Signed)
Called patient to inform her urine culture revealed a UTI. Will send in keflex to treat this.

## 2014-03-07 LAB — URINE CULTURE: Colony Count: >=100000

## 2014-03-08 ENCOUNTER — Encounter: Payer: Self-pay | Admitting: Family Medicine

## 2014-03-08 ENCOUNTER — Ambulatory Visit (INDEPENDENT_AMBULATORY_CARE_PROVIDER_SITE_OTHER): Payer: Commercial Managed Care - HMO | Admitting: Family Medicine

## 2014-03-08 VITALS — BP 118/58 | HR 55 | Temp 98.2°F | Ht 64.0 in | Wt 189.0 lb

## 2014-03-08 DIAGNOSIS — M159 Polyosteoarthritis, unspecified: Secondary | ICD-10-CM

## 2014-03-08 DIAGNOSIS — G20 Parkinson's disease: Secondary | ICD-10-CM

## 2014-03-08 DIAGNOSIS — Z5189 Encounter for other specified aftercare: Secondary | ICD-10-CM

## 2014-03-08 DIAGNOSIS — R52 Pain, unspecified: Secondary | ICD-10-CM | POA: Insufficient documentation

## 2014-03-08 DIAGNOSIS — M150 Primary generalized (osteo)arthritis: Secondary | ICD-10-CM

## 2014-03-08 DIAGNOSIS — M8949 Other hypertrophic osteoarthropathy, multiple sites: Secondary | ICD-10-CM

## 2014-03-08 DIAGNOSIS — E538 Deficiency of other specified B group vitamins: Secondary | ICD-10-CM

## 2014-03-08 DIAGNOSIS — Z23 Encounter for immunization: Secondary | ICD-10-CM

## 2014-03-08 MED ORDER — HYDROCODONE-ACETAMINOPHEN 5-325 MG PO TABS: ORAL_TABLET | ORAL | Status: DC

## 2014-03-08 MED ORDER — CYANOCOBALAMIN 1000 MCG/ML IJ SOLN
1000.0000 ug | Freq: Once | INTRAMUSCULAR | Status: AC
  Administered 2014-03-08: 1000 ug via INTRAMUSCULAR

## 2014-03-08 NOTE — Assessment & Plan Note (Signed)
I think she needs to get back into see her neurologist given her swallowing issues.

## 2014-03-08 NOTE — Patient Instructions (Signed)
I have given you a prescription for pain medicine that should last you one month. Please set appear mammogram as you are overdue. Have a great holiday!

## 2014-03-08 NOTE — Assessment & Plan Note (Signed)
Corticosteroid injection to left knee today.

## 2014-03-08 NOTE — Assessment & Plan Note (Signed)
I agreed to give her #60 of the hydrocodone per month. Prescription given today.

## 2014-03-08 NOTE — Progress Notes (Signed)
   Subjective:    Patient ID: [name], female    DOB: 11-Oct-1945, 68 y.o.   MRN: [id number]  HPI Left knee pain. Patient wanted a corticosteroid injection. She also wants to discuss using narcotic pain medicines. At last office visit I gave her a prescription for #120 and now still last her 2 months. She said she's not good it rationing them and use them all up before she was supposed to. She agrees they getting a one-month prescription would be a better idea for her and that she could get by with 2 tablets a day. #2. Still having some problems with swallowing. Thinks it's related to her Parkinson's. Also has episodes of difficulty talking. He has not seen her neurologist some time.   Review of Systems No fever, sweats, chills. No erythema of the left knee. She has had some mild swelling of the left knee. No calf pain.    Objective:   Physical Exam  Vital signs are reviewed GEN.: Well-developed female no acute distress KNEE: Left. Some external changes of osteoarthritis consistent with chronic cellulitis. No erythema. She lacks full extension by 5. She has full flexion. Medial and lateral joint line tenderness are present. The calf is soft. Distally she is neurovascularly intact. HEENT: Oropharynx is without any exit 8 or any gross abnormality. Neck is without lymphadenopathy or thyromegaly. NEUROLOGICAL: Resting tremor in the hands. She also has some oro-buccal dyskinesias evident.  INJECTION: Patient was given informed consent, signed copy in the chart. Appropriate time out was taken. Area prepped and draped in usual sterile fashion. 1 cc of methylprednisolone 40 mg/ml plus  4 cc of 1% lidocaine without epinephrine was injected into the left knee using a(n) anterior medial approach. The patient tolerated the procedure well. There were no complications. Post procedure instructions were given.        Assessment & Plan:

## 2014-03-13 ENCOUNTER — Other Ambulatory Visit: Payer: Self-pay

## 2014-03-13 DIAGNOSIS — Z1231 Encounter for screening mammogram for malignant neoplasm of breast: Secondary | ICD-10-CM

## 2014-03-23 ENCOUNTER — Other Ambulatory Visit: Payer: Self-pay | Admitting: Family Medicine

## 2014-03-23 ENCOUNTER — Telehealth: Payer: Self-pay | Admitting: *Deleted

## 2014-03-23 DIAGNOSIS — G20 Parkinson's disease: Secondary | ICD-10-CM

## 2014-03-23 NOTE — Telephone Encounter (Signed)
[name] with [facility] called needing a [employer] referral placed.  Pt has an upcoming follow up appt 03/27/14 with Dr. Curt Bears.  Dr. Gladstone Lighter NPI number [id number]; ICD-10 code G20 Parkinsonism Disease.  Please call when completed at [phone].  Derl Barrow, RN

## 2014-03-24 ENCOUNTER — Ambulatory Visit: Payer: Commercial Managed Care - HMO

## 2014-03-24 NOTE — Telephone Encounter (Signed)
Authorization number [id number]

## 2014-03-27 ENCOUNTER — Ambulatory Visit: Payer: Self-pay | Admitting: Diagnostic Neuroimaging

## 2014-03-31 ENCOUNTER — Encounter: Payer: Self-pay | Admitting: Cardiology

## 2014-03-31 ENCOUNTER — Ambulatory Visit (INDEPENDENT_AMBULATORY_CARE_PROVIDER_SITE_OTHER): Payer: Commercial Managed Care - HMO | Admitting: Cardiology

## 2014-03-31 VITALS — BP 122/70 | HR 56 | Ht 64.0 in | Wt 189.0 lb

## 2014-03-31 DIAGNOSIS — R600 Localized edema: Secondary | ICD-10-CM

## 2014-03-31 DIAGNOSIS — R079 Chest pain, unspecified: Secondary | ICD-10-CM

## 2014-03-31 DIAGNOSIS — R0600 Dyspnea, unspecified: Secondary | ICD-10-CM

## 2014-03-31 LAB — HEPATIC FUNCTION PANEL
ALT: <8 U/L (ref 0–35)
AST: 15 U/L (ref 0–37)
Albumin: 4 g/dL (ref 3.5–5.2)
Alkaline Phosphatase: 94 U/L (ref 39–117)
Bilirubin, Direct: 0.1 mg/dL (ref 0.0–0.3)
Indirect Bilirubin: 0.6 mg/dL (ref 0.2–1.2)
Total Bilirubin: 0.7 mg/dL (ref 0.2–1.2)
Total Protein: 6.7 g/dL (ref 6.0–8.3)

## 2014-03-31 LAB — BASIC METABOLIC PANEL
BUN: 14 mg/dL (ref 6–23)
CO2: 22 mEq/L (ref 19–32)
Calcium: 9.2 mg/dL (ref 8.4–10.5)
Chloride: 109 mEq/L (ref 96–112)
Creat: 0.98 mg/dL (ref 0.50–1.10)
Glucose, Bld: 145 mg/dL — ABNORMAL HIGH (ref 70–99)
Potassium: 3.8 mEq/L (ref 3.5–5.3)
Sodium: 138 mEq/L (ref 135–145)

## 2014-03-31 LAB — TSH: TSH: 0.517 u[IU]/mL (ref 0.350–4.500)

## 2014-03-31 NOTE — Patient Instructions (Signed)
Your physician recommends that you schedule a follow-up appointment in:  [facility] IN January  Your physician recommends that you continue on your current medications as directed. Please refer to the Current Medication list given to you today. Your physician recommends that you return for lab work in:  [place]  TSH  Your physician has requested that you have an echocardiogram. Echocardiography is a painless test that uses sound waves to create images of your heart. It provides your doctor with information about the size and shape of your heart and how well your heart's chambers and valves are working. This procedure takes approximately one hour. There are no restrictions for this procedure.  Your physician has requested that you have a lexiscan myoview. For further information please visit [email or web address]. Please follow instruction sheet, as given.

## 2014-03-31 NOTE — Progress Notes (Signed)
Patient ID: [name], female   DOB: Apr 13, 1946, 68 y.o.   MRN: [id number]     [name] Date of Encounter: 03/31/2014  Primary Care Provider:  [name], MD Primary Cardiologist:  Dorothy Spark   Problem List   Past Medical History  Diagnosis Date  . Allergy   . Anxiety   . Arthritis   . Depression   . Diabetes mellitus   . Hyperlipidemia   . Movement disorder   . Hypertension   . Scoliosis   . Esophageal stricture   . Osteoarthritis   . Cognitive impairment 12/11/2013   Past Surgical History  Procedure Laterality Date  . Total knee arthroplasty      right  . Esophagogastric fundoplasty      some type "esoph surgery" per pt  . Colon surgery    . Abdominal hysterectomy      Allergies  Allergies  Allergen Reactions  . Ace Inhibitors Anaphylaxis and Swelling  . Lisinopril Swelling    Severe facial angioedema requiring hospitalization 2007 (approx)  . Azilect [Rasagiline Mesylate] Other (See Comments)    hypotension    HPI  68 year old female with prior medical history of hypertension, diabetes, hyperlipidemia and Parkinson's disease who went to the ER in August 2015 with dull substernal, nonradiating chest pain. Her vital signs remained stable and within normal limits throughout her hospitalization. Her initial workup revealed EKG without acute changes, Troponin negative x 2, and otherwise stable. Her chest pain subsequently resolved. From a cardiac standpoint, ACS has been ruled out at this admission. She was discharged home to be followed with cardiology. The patient states that she has been experiencing chest pain for the last 15 years but they have been increasing in frequency. They are not related to exertion but related to stress. They're not associated with shortness of breath or diaphoresis. She has been experiencing exertional shortness of breath that has been significantly worse in the last year. She is also experiencing  dizziness but no history of syncopal episode.   Home Medications  Prior to Admission medications   Medication Sig Start Date End Date Taking? Authorizing Provider  aspirin 81 MG tablet Take 1 tablet (81 mg total) by mouth daily. 12/16/13  Yes Zigmund Gottron, MD  atorvastatin (LIPITOR) 40 MG tablet Take 1 tablet (40 mg total) by mouth daily. 12/16/13  Yes Zigmund Gottron, MD  carbidopa-levodopa (SINEMET) 25-100 MG per tablet Take 2 tablets by mouth 3 (three) times daily. 12/26/13  Yes Penni Bombard, MD  gabapentin (NEURONTIN) 300 MG capsule Take 300 mg by mouth 3 (three) times daily. 10/13/13  Yes Historical Provider, MD  HYDROcodone-acetaminophen (NORCO/VICODIN) 5-325 MG per tablet Take one tablet  AM, one mid day and 2 at bedtime AS NEEDED for knee and back pain max 4 tabs a day 03/08/14  Yes [name], MD  metFORMIN (GLUCOPHAGE) 850 MG tablet Take 1 tablet (850 mg total) by mouth daily with breakfast. 02/10/14  Yes [name], MD  propranolol ER (INDERAL LA) 80 MG 24 hr capsule Take 1 capsule (80 mg total) by mouth daily. 10/06/12  Yes [name], MD  sertraline (ZOLOFT) 100 MG tablet  12/29/13  Yes Historical Provider, MD    Family History  Family History  Problem Relation Age of Onset  . Cerebral palsy Son   . Heart disease Mother   . Diabetes Mother   . Heart disease Father     Social  History  History   Social History  . Marital Status: Married    Spouse Name: [name]    Number of Children: 2  . Years of Education: 14   Occupational History  . Retired    Social History Main Topics  . Smoking status: Never Smoker   . Smokeless tobacco: Never Used  . Alcohol Use: No  . Drug Use: No  . Sexual Activity: Not on file   Other Topics Concern  . Not on file   Social History Narrative   Health Care POA:    Emergency Contact: daughter, [name], [phone] husband, [name] [phone]   End of Life Plan:   Who lives with you: Lives with husband and son,  [name]   Any pets: Rabbit, [name]   Diet: Patient has a varied diet but struggles with what to eat with hypertension, diabetes, parkinsons   Exercise: Patient does not have any regular exercise routine.   Seatbelts: Patient reports wearing her seatbelt when in vehicle.   Nancy Fetter Exposure/Protection: Patient reports wearing sun block lotion daily.   Hobbies: Watching game shows, writing poetry, writing in journal, volunteering at day program with son.    Caffeine Use: very little occasional              Review of Systems, as per HPI, otherwise negative General:  No chills, fever, night sweats or weight changes.  Cardiovascular:  No chest pain, dyspnea on exertion, edema, orthopnea, palpitations, paroxysmal nocturnal dyspnea. Dermatological: No rash, lesions/masses Respiratory: No cough, dyspnea Urologic: No hematuria, dysuria Abdominal:   No nausea, vomiting, diarrhea, bright red blood per rectum, melena, or hematemesis Neurologic:  No visual changes, wkns, changes in mental status. All other systems reviewed and are otherwise negative except as noted above.  Physical Exam  Blood pressure 122/70, pulse 56, height 5\' 4"  (1.626 m), weight 189 lb (85.73 kg), SpO2 96 %.  General: Pleasant, NAD Psych: Normal affect. Neuro: Alert and oriented X 3. Moves all extremities spontaneously. HEENT: Normal  Neck: Supple without bruits or JVD. Lungs:  Resp regular and unlabored, CTA. Heart: RRR no s3, s4, or murmurs. Abdomen: Soft, non-tender, non-distended, BS + x 4.  Extremities: No clubbing, cyanosis, B/L LE edema . DP/PT/Radials 2+ and equal bilaterally.  Labs:  No results for input(s): CKTOTAL, CKMB, TROPONINI in the last 72 hours. Lab Results  Component Value Date   WBC 11.9* 03/03/2014   HGB 13.3 03/03/2014   HCT 39.6 03/03/2014   MCV 91.5 03/03/2014   PLT 390 03/03/2014    Lab Results  Component Value Date   DDIMER  10/07/2007    0.30        AT THE INHOUSE ESTABLISHED  CUTOFF VALUE OF 0.48 ug/mL FEU, THIS ASSAY HAS BEEN DOCUMENTED IN THE LITERATURE TO HAVE   Invalid input(s): POCBNP    Component Value Date/Time   NA 137 12/10/2013 1549   K 3.6* 12/10/2013 1549   CL 99 12/10/2013 1549   CO2 22 12/10/2013 1549   GLUCOSE 127* 12/10/2013 1549   BUN 15 12/10/2013 1549   CREATININE 1.11* 12/10/2013 2212   CREATININE 1.03 10/06/2012 1025   CALCIUM 9.1 12/10/2013 1549   PROT 7.4 12/10/2013 1549   ALBUMIN 3.4* 12/10/2013 1549   AST 16 12/10/2013 1549   ALT <5 12/10/2013 1549   ALKPHOS 77 12/10/2013 1549   BILITOT 0.8 12/10/2013 1549   GFRNONAA 50* 12/10/2013 2212   GFRAA 58* 12/10/2013 2212   Lab Results  Component Value Date  CHOL 191 12/11/2013   HDL 31* 12/11/2013   LDLCALC 123* 12/11/2013   TRIG 184* 12/11/2013    Accessory Clinical Findings  Echocardiogram - none  ECG - SR, non-specific ST-T wave abnormalities    Assessment & Plan  68 year old female  1. Chest pain - atypical, DOE - multiple risk factors including DM, obesity, HTN, HLP - we will order a Lexiscan nuclear stress test.   2. DOE, LE edema - we will check echocardiogram to evaluate for systolic and diastolic function. We will check CMP, if normal renal function start lasix and KCl (h/o hypokalemia).  3. HTN - controlled  4. Lipids - elevated during the admission and started on atorvastatin 40 mg po daily, no follow up LFTs, we will check today.   Follow up in 6 weeks.   Dorothy Spark, MD, [facility] 03/31/2014, 3:16 PM

## 2014-04-04 ENCOUNTER — Encounter: Payer: Self-pay | Admitting: Diagnostic Neuroimaging

## 2014-04-04 ENCOUNTER — Other Ambulatory Visit: Payer: Self-pay | Admitting: *Deleted

## 2014-04-04 ENCOUNTER — Ambulatory Visit (INDEPENDENT_AMBULATORY_CARE_PROVIDER_SITE_OTHER): Payer: Commercial Managed Care - HMO | Admitting: Diagnostic Neuroimaging

## 2014-04-04 VITALS — BP 115/57 | HR 55 | Ht 64.0 in | Wt 189.2 lb

## 2014-04-04 DIAGNOSIS — G20 Parkinson's disease: Secondary | ICD-10-CM

## 2014-04-04 NOTE — Progress Notes (Signed)
[facility]  PATIENT: [name] DOB: December 12, 1945  REFERRING CLINICIAN:  HISTORY FROM: patient and husband REASON FOR VISIT: follow up   HISTORICAL  CHIEF COMPLAINT:  Chief Complaint  Patient presents with  . Follow-up    HISTORY OF PRESENT ILLNESS:   UPDATE 04/04/14: Since last visit, she reports that she is stable from PD standpoint. Tremor and gait are stable. Tolerating carb/levo 1.5 tabs TID. Hasn't tried going up to 2 tabs TID. Re: swallowing issues, she reports remote esophagus surgery for swallow issues 15 yrs ago. Feels that this is a problem again, and wants to see GI. Re: rash, it turns out that they had bedbugs in their home; now problem is resolved. Struggling with diarrhea and excess sweating.  UPDATE 12/26/13: Since last visit, was hospitalized (2 weeks ago) for delirium, hallucinations, chest pain, UTI, and medication overuse. Was taking ambien and a friend's xanax to help her sleep (was struggling with anxiety and insomnia; not seen Dr. Casimiro Needle in a while). Now back at home and doing better. Tremor and gait are worsening. Some wearing off (early AM and late evening). Also with 2 weeks are night time itching and rash. Going to see dermatology soon.   UPDATE 01/18/13: Since last visit, now on carb/levo 1.5 tabs TID. Nausea is improved since she started taking her metformin at different time than carb/levo. Tremor, balance, coordination are stable.  UPDATE 06/01/12: Since last visit, tried carb/levo 1.5 tabs TID x 1 week, then stopped. Felt like it was too much medicine. Did not have increased side effects. Has been struggling with nausea throughout. Tremor is persistent.  UPDATE 01/28/12: Doing well. No wearing off. Tolerating meds. Stopped azilect (per PCP for nausea). Now on fluoxetine for depression.  UPDATE 08/27/10: Doing better on carb/levo.  Taking 1 tab TID (6am, 4pm, 9pm).  Minimal nausea.  Wakes up with more tremor in AM, then  reduction in tremor 77min after dose.  Effect wears off around 4pm.  Daughter reports some intermittent confusion.  PRIOR HPI (06/26/10): 68 yo Caucasian female referred to [place] for tremor with concern for possible Parkinson's disease. [name] notes she first noticed a resting tremor in her left hand about 3 years ago, and this has gotten progressively worse since then. It has also spread to now involve her mouth and right hand as well, though she notes it is still worst in her left hand. Stress and anxiety can accentuate the tremors, whereas active use can reduce them. She also notes the tremors being worse in the morning. She denies noting anything else that seems to make the tremors better or worse. She admits to what seems like possible bradykinesia, in her words that she has "a hard time getting going sometimes," but denies any freezing. She does note some new difficulty with writing, but denies micrographia. She also admits to feeling like her balance and walking is "a bit off," but she relates this more to the osteoarthritis in her knees and having had surgery on her R knee. She denies orthostatic symptoms, hallucinations or delusions, difficulty standing or sitting, weakness, new visual changes (aside from her macular degeneration), or feelings of rigidity. She also denies bizarre dreams, nightmares, RLS symptoms, or REM behavior disorder symptoms. She admits to having two uncles who have tremors, etiology unclear, but adds that one uncle had alcoholism (and it was believed this was the cause). She is concerned about what is causing her tremors, and admits that although she initially "  put off" being evaluated further, she is anxious to know what might be the cause of her tremors.  REVIEW OF SYSTEMS: Full 14 system review of systems performed and notable only for excess sweating runny nose trouble swallowing diarrhea incontinence freq waking back pain depression anxiety.   ALLERGIES: Allergies    Allergen Reactions  . Ace Inhibitors Anaphylaxis and Swelling  . Lisinopril Swelling    Severe facial angioedema requiring hospitalization 2007 (approx)  . Azilect [Rasagiline Mesylate] Other (See Comments)    hypotension    HOME MEDICATIONS: Outpatient Prescriptions Prior to Visit  Medication Sig Dispense Refill  . aspirin 81 MG tablet Take 1 tablet (81 mg total) by mouth daily. 30 tablet   . atorvastatin (LIPITOR) 40 MG tablet Take 1 tablet (40 mg total) by mouth daily. 90 tablet 3  . carbidopa-levodopa (SINEMET) 25-100 MG per tablet Take 2 tablets by mouth 3 (three) times daily. 180 tablet 12  . gabapentin (NEURONTIN) 300 MG capsule Take 300 mg by mouth 3 (three) times daily.    Marland Kitchen HYDROcodone-acetaminophen (NORCO/VICODIN) 5-325 MG per tablet Take one tablet  AM, one mid day and 2 at bedtime AS NEEDED for knee and back pain max 4 tabs a day 60 tablet 0  . metFORMIN (GLUCOPHAGE) 850 MG tablet Take 1 tablet (850 mg total) by mouth daily with breakfast. 90 tablet 3  . propranolol ER (INDERAL LA) 80 MG 24 hr capsule Take 1 capsule (80 mg total) by mouth daily. 90 capsule 3  . sertraline (ZOLOFT) 100 MG tablet      No facility-administered medications prior to visit.    PAST MEDICAL HISTORY: Past Medical History  Diagnosis Date  . Allergy   . Anxiety   . Arthritis   . Depression   . Diabetes mellitus   . Hyperlipidemia   . Movement disorder   . Hypertension   . Scoliosis   . Esophageal stricture   . Osteoarthritis   . Cognitive impairment 12/11/2013    PAST SURGICAL HISTORY: Past Surgical History  Procedure Laterality Date  . Total knee arthroplasty      right  . Esophagogastric fundoplasty      some type "esoph surgery" per pt  . Colon surgery    . Abdominal hysterectomy      FAMILY HISTORY: Family History  Problem Relation Age of Onset  . Cerebral palsy Son   . Heart disease Mother   . Diabetes Mother   . Heart disease Father     SOCIAL HISTORY:  History    Social History  . Marital Status: Married    Spouse Name: [name]    Number of Children: 2  . Years of Education: 14   Occupational History  . Retired    Social History Main Topics  . Smoking status: Never Smoker   . Smokeless tobacco: Never Used  . Alcohol Use: No  . Drug Use: No  . Sexual Activity: Not on file   Other Topics Concern  . Not on file   Social History Narrative   Health Care POA:    Emergency Contact: daughter, [name], [phone] husband, [name] [phone]   End of Life Plan:   Who lives with you: Lives with husband and son, [name]   Any pets: Rabbit, [name]   Diet: Patient has a varied diet but struggles with what to eat with hypertension, diabetes, parkinsons   Exercise: Patient does not have any regular exercise routine.   Seatbelts: Patient reports wearing her seatbelt when  in vehicle.   Nancy Fetter Exposure/Protection: Patient reports wearing sun block lotion daily.   Hobbies: Watching game shows, writing poetry, writing in journal, volunteering at day program with son.    Caffeine Use: very little occasional              PHYSICAL EXAM  Filed Vitals:   04/04/14 1434  BP: 115/57  Pulse: 55  Height: 5\' 4"  (1.626 m)  Weight: 189 lb 3.2 oz (85.821 kg)    Not recorded      Body mass index is 32.46 kg/(m^2).  GENERAL EXAM: General: Patient is awake, alert and in no acute distress.  Well developed and groomed. Neck: Neck is supple. Cardiovascular: No carotid artery bruits.  Heart is regular rate and rhythm with no murmurs.  Neurologic Exam  Mental Status: Awake, alert. Language is fluent and comprehension intact. Cranial Nerves: Pupils are equal and reactive to light.  Visual fields are full to confrontation.  Conjugate eye movements are full and symmetric.  Facial sensation and strength are symmetric.  Hearing is intact.  Palate elevated symmetrically and uvula is midline.  Shoulder shrug is symmetric.  Tongue is midline. MILD OROLINGUAL  DYSKINESIAS. Motor: INTERMITTENT RESTING TREMOR OF BUE AND MOUTH. MINIMAL POSTURAL TREMOR. BRADYKINESIA AND RIGIDITY IN LUE>RUE, LLE>RLE. Normal bulk. Full strength in the upper and lower extremities.  No pronator drift. Sensory: Intact and symmetric to light touch. Coordination: No ataxia or dysmetria on finger-nose or rapid alternating movement testing. Reflexes: BUE and BLE present and symmetric Gait and Station: Narrow based gait. DECR ARM SWING.   DIAGNOSTIC DATA (LABS, IMAGING, TESTING) - I reviewed patient records, labs, notes, testing and imaging myself where available.  Lab Results  Component Value Date   WBC 11.9* 03/03/2014   HGB 13.3 03/03/2014   HCT 39.6 03/03/2014   MCV 91.5 03/03/2014   PLT 390 03/03/2014      Component Value Date/Time   NA 138 03/31/2014 1602   K 3.8 03/31/2014 1602   CL 109 03/31/2014 1602   CO2 22 03/31/2014 1602   GLUCOSE 145* 03/31/2014 1602   BUN 14 03/31/2014 1602   CREATININE 0.98 03/31/2014 1602   CREATININE 1.11* 12/10/2013 2212   CALCIUM 9.2 03/31/2014 1602   PROT 6.7 03/31/2014 1602   ALBUMIN 4.0 03/31/2014 1602   AST 15 03/31/2014 1602   ALT <8 03/31/2014 1602   ALKPHOS 94 03/31/2014 1602   BILITOT 0.7 03/31/2014 1602   GFRNONAA 50* 12/10/2013 2212   GFRAA 58* 12/10/2013 2212   Lab Results  Component Value Date   CHOL 191 12/11/2013   HDL 31* 12/11/2013   LDLCALC 123* 12/11/2013   LDLDIRECT 151* 10/06/2012   TRIG 184* 12/11/2013   CHOLHDL 6.2 12/11/2013   Lab Results  Component Value Date   HGBA1C 6.6 03/03/2014   Lab Results  Component Value Date   VITAMINB12 301 01/05/2008   Lab Results  Component Value Date   TSH 0.517 03/31/2014    07/05/10 MRI BRAIN - minimal scattered chronic small vessel ischemic disease   ASSESSMENT AND PLAN  68 y.o. female with resting tremor of BUE, cogwheel rigidity, bradykinesia, decreased arm swing, anxiety, diff swallowing, memory loss.   Dx: parkinson's disease  PLAN: 1.  Continue carb/levo 25/100 1.5 tabs TID; may increase to 2 tabs TID as needed; then in future we may consider using Azilect in the future for better parkinson's control if she has significant on/off fluctuations 2. Follow up with GI re: swallow issue 3.  Follow up with PCP re: excess sweating issue  Return in about 6 months (around 10/04/2014).    Penni Bombard, MD 84/16/6063, 0:16 PM Certified in Neurology, Neurophysiology and Neuroimaging  [facility] [address] [place], [place] [address] [phone]

## 2014-04-04 NOTE — Telephone Encounter (Signed)
Patient requesting refill on pain medication. Asks that [name] calls he with any questions.

## 2014-04-05 ENCOUNTER — Encounter: Payer: Self-pay | Admitting: Cardiology

## 2014-04-10 ENCOUNTER — Ambulatory Visit (HOSPITAL_COMMUNITY): Payer: Commercial Managed Care - HMO | Attending: Cardiology | Admitting: Radiology

## 2014-04-10 ENCOUNTER — Ambulatory Visit (HOSPITAL_BASED_OUTPATIENT_CLINIC_OR_DEPARTMENT_OTHER): Payer: Commercial Managed Care - HMO | Admitting: Cardiology

## 2014-04-10 DIAGNOSIS — E785 Hyperlipidemia, unspecified: Secondary | ICD-10-CM | POA: Insufficient documentation

## 2014-04-10 DIAGNOSIS — R0600 Dyspnea, unspecified: Secondary | ICD-10-CM

## 2014-04-10 DIAGNOSIS — R0609 Other forms of dyspnea: Secondary | ICD-10-CM | POA: Diagnosis not present

## 2014-04-10 DIAGNOSIS — R079 Chest pain, unspecified: Secondary | ICD-10-CM

## 2014-04-10 DIAGNOSIS — E119 Type 2 diabetes mellitus without complications: Secondary | ICD-10-CM | POA: Insufficient documentation

## 2014-04-10 DIAGNOSIS — R42 Dizziness and giddiness: Secondary | ICD-10-CM | POA: Diagnosis not present

## 2014-04-10 DIAGNOSIS — I10 Essential (primary) hypertension: Secondary | ICD-10-CM | POA: Diagnosis not present

## 2014-04-10 MED ORDER — TECHNETIUM TC 99M SESTAMIBI GENERIC - CARDIOLITE
30.0000 | Freq: Once | INTRAVENOUS | Status: AC | PRN
  Administered 2014-04-10: 30 via INTRAVENOUS

## 2014-04-10 MED ORDER — TECHNETIUM TC 99M SESTAMIBI GENERIC - CARDIOLITE
10.0000 | Freq: Once | INTRAVENOUS | Status: AC | PRN
  Administered 2014-04-10: 10 via INTRAVENOUS

## 2014-04-10 MED ORDER — REGADENOSON 0.4 MG/5ML IV SOLN
0.4000 mg | Freq: Once | INTRAVENOUS | Status: AC
Start: 2014-04-10 — End: 2014-04-10
  Administered 2014-04-10: 0.4 mg via INTRAVENOUS

## 2014-04-10 NOTE — Progress Notes (Signed)
Echo performed. 

## 2014-04-10 NOTE — Telephone Encounter (Signed)
Attempted to call patient, no answer 

## 2014-04-10 NOTE — Telephone Encounter (Signed)
Dear [name] Team Ok this rx is handwritten and up front  Please call her and tell her it is up front THANKS! [name]

## 2014-04-10 NOTE — Progress Notes (Signed)
  [facility] [address] [place], [place] [address] [phone]    Cardiology Nuclear Med Study  [name] is a 68 y.o. female     MRN : [id number]     DOB: Aug 17, 1945  Procedure Date: 04/10/2014  Nuclear Med Background Indication for Stress Test:  Evaluation for Ischemia History:  No known CAD Cardiac Risk Factors: Hypertension, Lipids and NIDDM  Symptoms:  Chest Pain (last date of chest discomfort was two weeks ago), Dizziness and DOE   Nuclear Pre-Procedure Caffeine/Decaff Intake:  None NPO After: 3:00am   Lungs:  clear O2 Sat: 92% on room air. IV 0.9% NS with Angio Cath:  22g  IV Site: R Hand  IV Started by:  Matilde Haymaker, RN  Chest Size (in):  38 Cup Size: B  Height: 5\' 4"  (1.626 m)  Weight:  185 lb (83.915 kg)  BMI:  Body mass index is 31.74 kg/(m^2). Tech Comments:  No Propranolol x 24 hrs    Nuclear Med Study 1 or 2 day study: 1 day  Stress Test Type:  Treadmill/Lexiscan  Reading MD: n/a  Order Authorizing Provider:  Filiberto Pinks  Resting Radionuclide: Technetium 53m Sestamibi  Resting Radionuclide Dose: 11.0 mCi   Stress Radionuclide:  Technetium 70m Sestamibi  Stress Radionuclide Dose: 33.0 mCi           Stress Protocol Rest HR: 54 Stress HR: 75  Rest BP: 139/77 Stress BP: 111/79  Exercise Time (min): n/a METS: n/a   Predicted Max HR: 151 bpm % Max HR: 49.67 bpm Rate Pressure Product: 12000   Dose of Adenosine (mg):  n/a Dose of Lexiscan: 0.4 mg  Dose of Atropine (mg): n/a Dose of Dobutamine: n/a mcg/kg/min (at max HR)  Stress Test Technologist: Glade Lloyd, BS-ES  Nuclear Technologist:  Earl Many, CNMT     Rest Procedure:  Myocardial perfusion imaging was performed at rest 45 minutes following the intravenous administration of Technetium 77m Sestamibi. Rest ECG: NSR - Normal EKG  Stress Procedure:  The patient received IV Lexiscan 0.4 mg over 15-seconds with concurrent low level exercise  and then Technetium 26m Sestamibi was injected at 30-seconds while the patient continued walking one more minute.  Quantitative spect images were obtained after a 45-minute delay.  During the infusion of Lexiscan the patient complained of fatigue, SOB and a headache.  These symptoms began to resolve in recovery.  Stress ECG: No significant change from baseline ECG  QPS Raw Data Images:  Normal; no motion artifact; normal heart/lung ratio. Stress Images:  Normal homogeneous uptake in all areas of the myocardium. Rest Images:  Normal homogeneous uptake in all areas of the myocardium. Subtraction (SDS):  No evidence of ischemia. Transient Ischemic Dilatation (Normal <1.22):  0.75 Lung/Heart Ratio (Normal <0.45):  0.32  Quantitative Gated Spect Images QGS EDV:  62 ml QGS ESV:  12 ml  Impression Exercise Capacity:  Lexiscan with low level exercise. BP Response:  Normal blood pressure response. Clinical Symptoms:  No significant symptoms noted. ECG Impression:  No significant ST segment change suggestive of ischemia. Comparison with Prior Nuclear Study: No previous nuclear study performed  Overall Impression:  Normal stress nuclear study.  LV Ejection Fraction: 80%.  LV Wall Motion:  NL LV Function; NL Wall Motion .   Thayer Headings, Brooke Bonito., MD, [facility] 04/10/2014, 5:34 PM [address]  [place] Pager [phone]

## 2014-04-11 ENCOUNTER — Ambulatory Visit
Admission: RE | Admit: 2014-04-11 | Discharge: 2014-04-11 | Disposition: A | Discharge status: Home / Self Care | Payer: Medicare HMO | Source: Ambulatory Visit

## 2014-04-11 DIAGNOSIS — Z1231 Encounter for screening mammogram for malignant neoplasm of breast: Secondary | ICD-10-CM

## 2014-04-11 NOTE — Telephone Encounter (Signed)
Pt is aware that rx is ready for pick up. Adis Sturgill,CMA

## 2014-04-17 ENCOUNTER — Telehealth: Payer: Self-pay

## 2014-04-17 DIAGNOSIS — I10 Essential (primary) hypertension: Secondary | ICD-10-CM

## 2014-04-17 MED ORDER — POTASSIUM CHLORIDE ER 10 MEQ PO TBCR
10.0000 meq | EXTENDED_RELEASE_TABLET | Freq: Every day | ORAL | Status: DC
Start: 2014-04-17 — End: 2014-06-07

## 2014-04-17 MED ORDER — FUROSEMIDE 40 MG PO TABS: 40.0000 mg | ORAL_TABLET | Freq: Every day | ORAL | Status: DC

## 2014-04-17 NOTE — Telephone Encounter (Signed)
-----   Message from Dorothy Spark, MD sent at 04/14/2014  9:13 PM EST ----- Negative stress test, normal systolic function but diastolic dysfunction (I will explain her more at the next visit). She should be started on Lasix 40 mg po daily and KCl 10 mEq daily. F/U scheduled for 04/28/14. Thank you, KN

## 2014-04-17 NOTE — Telephone Encounter (Signed)
Called patient with results of her stress test. Per Dr. Meda Coffee, negative stress test, normal systolic function but diastolic dysfunction and Dr. Meda Coffee will explain this in more detail on her next visit. Ordered Lasix 40 mg po daily and KCL 10 mEq daily. Follow-up with Dr. Meda Coffee is scheduled for 04/28/14. Verified patient's pharmacy medication orders sent. Patient verbalized understanding and repeated instructions.

## 2014-04-28 ENCOUNTER — Ambulatory Visit: Payer: Commercial Managed Care - HMO | Admitting: Cardiology

## 2014-05-08 ENCOUNTER — Other Ambulatory Visit: Payer: Self-pay | Admitting: Family Medicine

## 2014-05-08 NOTE — Telephone Encounter (Signed)
Pt called and needs a refill on her pain medication left up front jw

## 2014-05-09 MED ORDER — HYDROCODONE-ACETAMINOPHEN 5-325 MG PO TABS: ORAL_TABLET | ORAL | Status: DC

## 2014-05-09 NOTE — Telephone Encounter (Signed)
LM for pt to return call to give her below information. [name], April D

## 2014-05-09 NOTE — Telephone Encounter (Signed)
Dear [name] Team Please tell her rx is up front THANKS! [name]

## 2014-05-11 NOTE — Telephone Encounter (Signed)
Patient has pickled rx up

## 2014-05-17 ENCOUNTER — Encounter: Payer: Self-pay | Admitting: Family Medicine

## 2014-05-17 ENCOUNTER — Ambulatory Visit (INDEPENDENT_AMBULATORY_CARE_PROVIDER_SITE_OTHER): Payer: Commercial Managed Care - HMO | Admitting: Family Medicine

## 2014-05-17 VITALS — BP 166/82 | HR 101 | Temp 97.9°F | Ht 64.0 in | Wt 182.3 lb

## 2014-05-17 DIAGNOSIS — M25562 Pain in left knee: Secondary | ICD-10-CM | POA: Insufficient documentation

## 2014-05-17 MED ORDER — KETOROLAC TROMETHAMINE 60 MG/2ML IM SOLN
60.0000 mg | Freq: Once | INTRAMUSCULAR | Status: AC
  Administered 2014-05-17: 60 mg via INTRAMUSCULAR

## 2014-05-17 NOTE — Patient Instructions (Signed)
It was nice to see you today.  I am getting an xray to ensure there is no fracture.  You can discuss your pain medication situation with Dr. Nori Riis.  Follow up with Dr. Nori Riis if you fail to improve or worsen.  Take care,  Dr. Lacinda Axon
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
## 2014-05-17 NOTE — Progress Notes (Signed)
   Subjective:    Patient ID: [name], female    DOB: 1945/10/02, 69 y.o.   MRN: [id number]  HPI 70 year old female with PMH of OA s/p R knee replacement presents for a same day appointment with complaints of left knee pain.  1) Left knee pain  Patient is accompanied by her husband today.  Patient reports that she slipped and fell on a wet spot on her hardwood floor 2 days ago.   She is unclear of how she landed, but since her fall she has had posterior left knee pain.  No reports of popping with injury.   Pain is severe currently 7/10.  No relieving factors. Pain is worse with activity/ambulation.  She has taken aspirin for her pain.  She has not taken her prescribed Hydrocodone as she reports that she is out of them.  She denies any associated bruising  No other complaints at this time.  Review of Systems Per HPI    Objective:   Physical Exam Filed Vitals:   05/17/14 1514  BP: 166/82  Pulse: 101  Temp: 97.9 F (36.6 C)   Exam: General: well appearing, NAD.  Knee: Left Normal to inspection with no erythema or obvious bony abnormalities. Palpation with lateral joint line tenderness. Effusion noted.  ROM decreased in flexion and extension secondary to pain.  Ligaments with solid consistent endpoints.  Negative Mcmurray's.  Non painful patellar compression. Patellar and quadriceps tendons unremarkable.     Assessment & Plan:  See Problem List

## 2014-06-07 ENCOUNTER — Encounter: Payer: Self-pay | Admitting: Cardiology

## 2014-06-07 ENCOUNTER — Other Ambulatory Visit: Payer: Self-pay | Admitting: Family Medicine

## 2014-06-07 ENCOUNTER — Ambulatory Visit (INDEPENDENT_AMBULATORY_CARE_PROVIDER_SITE_OTHER): Payer: Commercial Managed Care - HMO | Admitting: Cardiology

## 2014-06-07 VITALS — BP 115/82 | HR 102 | Ht 64.0 in | Wt 186.2 lb

## 2014-06-07 DIAGNOSIS — E785 Hyperlipidemia, unspecified: Secondary | ICD-10-CM | POA: Diagnosis not present

## 2014-06-07 DIAGNOSIS — I5032 Chronic diastolic (congestive) heart failure: Secondary | ICD-10-CM | POA: Diagnosis not present

## 2014-06-07 DIAGNOSIS — I10 Essential (primary) hypertension: Secondary | ICD-10-CM | POA: Diagnosis not present

## 2014-06-07 LAB — COMPREHENSIVE METABOLIC PANEL
ALT: 9 U/L (ref 0–35)
AST: 26 U/L (ref 0–37)
Albumin: 4.2 g/dL (ref 3.5–5.2)
Alkaline Phosphatase: 95 U/L (ref 39–117)
BUN: 16 mg/dL (ref 6–23)
CO2: 31 mEq/L (ref 19–32)
Calcium: 10.5 mg/dL (ref 8.4–10.5)
Chloride: 100 mEq/L (ref 96–112)
Creatinine, Ser: 1.21 mg/dL — ABNORMAL HIGH (ref 0.40–1.20)
GFR: 46.87 mL/min — ABNORMAL LOW (ref >60.00)
Glucose, Bld: 116 mg/dL — ABNORMAL HIGH (ref 70–99)
Potassium: 3.6 mEq/L (ref 3.5–5.1)
Sodium: 139 mEq/L (ref 135–145)
Total Bilirubin: 0.6 mg/dL (ref 0.2–1.2)
Total Protein: 7.4 g/dL (ref 6.0–8.3)

## 2014-06-07 LAB — LIPID PANEL
Cholesterol: 244 mg/dL — ABNORMAL HIGH (ref 0–200)
HDL: 38.6 mg/dL — ABNORMAL LOW (ref >39.00)
NonHDL: 205.4
Total CHOL/HDL Ratio: 6
Triglycerides: 384 mg/dL — ABNORMAL HIGH (ref 0.0–149.0)
VLDL: 76.8 mg/dL — ABNORMAL HIGH (ref 0.0–40.0)

## 2014-06-07 LAB — LDL CHOLESTEROL, DIRECT: Direct LDL: 140 mg/dL

## 2014-06-07 NOTE — Progress Notes (Signed)
Patient ID: [name], female   DOB: 03/17/1946, 69 y.o.   MRN: [id number]    [name] Date of Encounter: 06/07/2014  Primary Care Provider:  [name], MD Primary Cardiologist:  Dorothy Spark   Problem List   Past Medical History  Diagnosis Date  . Allergy   . Anxiety   . Arthritis   . Depression   . Diabetes mellitus   . Hyperlipidemia   . Movement disorder   . Hypertension   . Scoliosis   . Esophageal stricture   . Osteoarthritis   . Cognitive impairment 12/11/2013   Past Surgical History  Procedure Laterality Date  . Total knee arthroplasty      right  . Esophagogastric fundoplasty      some type "esoph surgery" per pt  . Colon surgery    . Abdominal hysterectomy      Allergies  Allergies  Allergen Reactions  . Ace Inhibitors Anaphylaxis and Swelling  . Azilect [Rasagiline Mesylate] Other (See Comments)    hypotension  . Lisinopril Swelling    Severe facial angioedema requiring hospitalization 2007 (approx)    HPI  69 year old female with prior medical history of hypertension, diabetes, hyperlipidemia and Parkinson's disease who went to the ER in August 2015 with dull substernal, nonradiating chest pain. Her vital signs remained stable and within normal limits throughout her hospitalization. Her initial workup revealed EKG without acute changes, Troponin negative x 2, and otherwise stable. Her chest pain subsequently resolved. From a cardiac standpoint, ACS has been ruled out at this admission. She was discharged home to be followed with cardiology. The patient states that she has been experiencing chest pain for the last 15 years but they have been increasing in frequency. They are not related to exertion but related to stress. They're not associated with shortness of breath or diaphoresis. She has been experiencing exertional shortness of breath that has been significantly worse in the last year. She is also experiencing  dizziness but no history of syncopal episode.   06/07/2014 - the patient is coming after 2 months, she underwent stress testing that showed no prior scar or no ischemia. Her echocardiogram showed preserved left ventricular ejection fraction and grade 2 diastolic dysfunction as a consequence patient was started on Lasix 40 mg daily. She was using it for 2 weeks but afterwards she states that it made her feel significantly tired and she stopped taking it. She doesn't feel any significant improvement in her symptoms. She doesn't exercise as that causes her leg pain and back pain she denies any chest pain. No palpitations or syncope.  Home Medications  Prior to Admission medications   Medication Sig Start Date End Date Taking? Authorizing Provider  aspirin 81 MG tablet Take 1 tablet (81 mg total) by mouth daily. 12/16/13  Yes Zigmund Gottron, MD  atorvastatin (LIPITOR) 40 MG tablet Take 1 tablet (40 mg total) by mouth daily. 12/16/13  Yes Zigmund Gottron, MD  carbidopa-levodopa (SINEMET) 25-100 MG per tablet Take 2 tablets by mouth 3 (three) times daily. 12/26/13  Yes Penni Bombard, MD  gabapentin (NEURONTIN) 300 MG capsule Take 300 mg by mouth 3 (three) times daily. 10/13/13  Yes Historical Provider, MD  HYDROcodone-acetaminophen (NORCO/VICODIN) 5-325 MG per tablet Take one tablet  AM, one mid day and 2 at bedtime AS NEEDED for knee and back pain max 4 tabs a day 03/08/14  Yes [name], MD  metFORMIN (GLUCOPHAGE) 850 MG tablet  Take 1 tablet (850 mg total) by mouth daily with breakfast. 02/10/14  Yes [name], MD  propranolol ER (INDERAL LA) 80 MG 24 hr capsule Take 1 capsule (80 mg total) by mouth daily. 10/06/12  Yes [name], MD  sertraline (ZOLOFT) 100 MG tablet  12/29/13  Yes Historical Provider, MD    Family History  Family History  Problem Relation Age of Onset  . Cerebral palsy Son   . Heart disease Mother   . Diabetes Mother   . Heart disease Father     Social  History  History   Social History  . Marital Status: Married    Spouse Name: [name]  . Number of Children: 2  . Years of Education: 14   Occupational History  . Retired    Social History Main Topics  . Smoking status: Never Smoker   . Smokeless tobacco: Never Used  . Alcohol Use: No  . Drug Use: No  . Sexual Activity: Not on file   Other Topics Concern  . Not on file   Social History Narrative   Health Care POA:    Emergency Contact: daughter, [name], [phone] husband, [name] [phone]   End of Life Plan:   Who lives with you: Lives with husband and son, [name]   Any pets: Rabbit, [name]   Diet: Patient has a varied diet but struggles with what to eat with hypertension, diabetes, parkinsons   Exercise: Patient does not have any regular exercise routine.   Seatbelts: Patient reports wearing her seatbelt when in vehicle.   Nancy Fetter Exposure/Protection: Patient reports wearing sun block lotion daily.   Hobbies: Watching game shows, writing poetry, writing in journal, volunteering at day program with son.    Caffeine Use: very little occasional              Review of Systems, as per HPI, otherwise negative General:  No chills, fever, night sweats or weight changes.  Cardiovascular:  No chest pain, dyspnea on exertion, edema, orthopnea, palpitations, paroxysmal nocturnal dyspnea. Dermatological: No rash, lesions/masses Respiratory: No cough, dyspnea Urologic: No hematuria, dysuria Abdominal:   No nausea, vomiting, diarrhea, bright red blood per rectum, melena, or hematemesis Neurologic:  No visual changes, wkns, changes in mental status. All other systems reviewed and are otherwise negative except as noted above.  Physical Exam  Blood pressure 115/82, pulse 102, height 5\' 4"  (1.626 m), weight 186 lb 3.2 oz (84.46 kg), SpO2 95 %.  General: Pleasant, NAD Psych: Normal affect. Neuro: Alert and oriented X 3. Moves all extremities spontaneously. HEENT:  Normal  Neck: Supple without bruits or JVD. Lungs:  Resp regular and unlabored, CTA. Heart: RRR no s3, s4, or murmurs. Abdomen: Soft, non-tender, non-distended, BS + x 4.  Extremities: No clubbing, cyanosis, B/L LE edema . DP/PT/Radials 2+ and equal bilaterally.  Labs:  No results for input(s): CKTOTAL, CKMB, TROPONINI in the last 72 hours. Lab Results  Component Value Date   WBC 11.9* 03/03/2014   HGB 13.3 03/03/2014   HCT 39.6 03/03/2014   MCV 91.5 03/03/2014   PLT 390 03/03/2014    Lab Results  Component Value Date   DDIMER  10/07/2007    0.30        AT THE INHOUSE ESTABLISHED CUTOFF VALUE OF 0.48 ug/mL FEU, THIS ASSAY HAS BEEN DOCUMENTED IN THE LITERATURE TO HAVE   Invalid input(s): POCBNP    Component Value Date/Time   NA 138 03/31/2014 1602   K 3.8 03/31/2014 1602  CL 109 03/31/2014 1602   CO2 22 03/31/2014 1602   GLUCOSE 145* 03/31/2014 1602   BUN 14 03/31/2014 1602   CREATININE 0.98 03/31/2014 1602   CREATININE 1.11* 12/10/2013 2212   CALCIUM 9.2 03/31/2014 1602   PROT 6.7 03/31/2014 1602   ALBUMIN 4.0 03/31/2014 1602   AST 15 03/31/2014 1602   ALT <8 03/31/2014 1602   ALKPHOS 94 03/31/2014 1602   BILITOT 0.7 03/31/2014 1602   GFRNONAA 50* 12/10/2013 2212   GFRAA 58* 12/10/2013 2212   Lab Results  Component Value Date   CHOL 191 12/11/2013   HDL 31* 12/11/2013   LDLCALC 123* 12/11/2013   TRIG 184* 12/11/2013    Accessory Clinical Findings  Echocardiogram - 04/10/2014 Left ventricle: E/e&'>14.6 consistent with elevated LV filling pressures. The cavity size was normal. There was moderate focal basal and mild concentric hypertrophy. Systolic function was normal. The estimated ejection fraction was in the range of 60% to 65%. Wall motion was normal; there were no regional wall motion abnormalities. There was an increased relative contribution of atrial contraction to ventricular filling. Doppler parameters are consistent with  abnormal left ventricular relaxation (grade 1 diastolic dysfunction). - Mitral valve: There was mild regurgitation.  Lexiscan nuclear stress test: 04/11/2014 Impression Exercise Capacity: Lexiscan with low level exercise. BP Response: Normal blood pressure response. Clinical Symptoms: No significant symptoms noted. ECG Impression: No significant ST segment change suggestive of ischemia. Comparison with Prior Nuclear Study: No previous nuclear study performed  Overall Impression: Normal stress nuclear study.  LV Ejection Fraction: 80%. LV Wall Motion: NL LV Function; NL Wall Motion .   ECG - SR, non-specific ST-T wave abnormalities    Assessment & Plan  69 year old female  1. Chest pain - negative stress test for scar and ischemia. Multiple risk factors including DM, obesity, HTN, HLP .  2. DOE, LE edema - echocardiogram showed preserved left ventricular ejection fraction and grade 2 diastolic dysfunction with elevated filling pressures she has lost 3 pounds on Lasix discontinue as it made her feel tired. She currently appeals euvolemic and we will discontinue Lasix.   3. HTN - controlled  4. Lipids - elevated during the admission and started on atorvastatin 40 mg po daily, her LFTs were normal in December we'll recheck her lipids and LFTs today.   Follow up in 3 months.   Dorothy Spark, MD, [facility] 06/07/2014, 9:10 AM

## 2014-06-07 NOTE — Patient Instructions (Signed)
Your physician recommends that you continue on your current medications as directed. Please refer to the Current Medication list given to you today.    Your physician recommends that you return for lab work in: TODAY ([place])    Your physician recommends that you schedule a follow-up appointment in: [place]

## 2014-06-07 NOTE — Telephone Encounter (Signed)
Pt called and needs a refill on her pain medication. Please call patient when ready. jw

## 2014-06-08 NOTE — Telephone Encounter (Signed)
Dear [name] Team I am not refilling it at this time.  See if you can get her on my schedule for next week--I need to see her and talk about her pain medicine. THANKS! [name]

## 2014-06-08 NOTE — Telephone Encounter (Signed)
Attempted to call patient, no answer 

## 2014-06-09 ENCOUNTER — Telehealth: Payer: Self-pay | Admitting: Cardiology

## 2014-06-09 DIAGNOSIS — E785 Hyperlipidemia, unspecified: Secondary | ICD-10-CM

## 2014-06-09 NOTE — Telephone Encounter (Signed)
Unable to leave message-no VM.

## 2014-06-09 NOTE — Telephone Encounter (Signed)
Spoke with patient and told her she will need an office visit for refills on her pain medication per Dr Nori Riis. States she has an appointment on Wednesday and will discuss her pain meds at that time.Quetzali Heinle, Kevin Fenton

## 2014-06-09 NOTE — Telephone Encounter (Signed)
New Message  Pt returning Ivy's phone call./ Please call back and discuss.

## 2014-06-09 NOTE — Telephone Encounter (Signed)
LM with patein's husband for her to call back

## 2014-06-13 MED ORDER — ATORVASTATIN CALCIUM 80 MG PO TABS: 80.0000 mg | ORAL_TABLET | Freq: Every day | ORAL | Status: DC

## 2014-06-13 NOTE — Telephone Encounter (Signed)
Informed the pt that per Dr Meda Coffee her labs revealed that she had elevated TAG and LDL, and she recommends her increase her atorvastatin to 80 mg once daily and refer her to lipid clinic. Confirmed the pharmacy of choice with the pt.  Informed the pt that lipid clinic is located within our office and is followed by PharmD Elberta Leatherwood.  Informed the pt that our schedulers will be in contact with her by phone to schedule her lipid clinic appt.  Pt verbalized understanding and agrees with this plan.

## 2014-06-13 NOTE — Telephone Encounter (Signed)
-----   Message from Dorothy Spark, MD sent at 06/08/2014 12:31 PM EST ----- She has significantly elevated TAG and LDL, I would increase atorvastatin to 80 mg po daily and refer to lipid clinic.

## 2014-06-14 ENCOUNTER — Encounter: Payer: Self-pay | Admitting: Family Medicine

## 2014-06-14 ENCOUNTER — Ambulatory Visit (INDEPENDENT_AMBULATORY_CARE_PROVIDER_SITE_OTHER): Payer: Commercial Managed Care - HMO | Admitting: Family Medicine

## 2014-06-14 VITALS — BP 147/97 | HR 91 | Temp 97.9°F | Ht 64.0 in | Wt 195.6 lb

## 2014-06-14 DIAGNOSIS — D649 Anemia, unspecified: Secondary | ICD-10-CM

## 2014-06-14 DIAGNOSIS — M159 Polyosteoarthritis, unspecified: Secondary | ICD-10-CM

## 2014-06-14 DIAGNOSIS — M25562 Pain in left knee: Secondary | ICD-10-CM | POA: Diagnosis not present

## 2014-06-14 DIAGNOSIS — G894 Chronic pain syndrome: Secondary | ICD-10-CM | POA: Diagnosis not present

## 2014-06-14 DIAGNOSIS — M150 Primary generalized (osteo)arthritis: Secondary | ICD-10-CM | POA: Diagnosis not present

## 2014-06-14 DIAGNOSIS — IMO0002 Reserved for concepts with insufficient information to code with codable children: Secondary | ICD-10-CM

## 2014-06-14 DIAGNOSIS — I10 Essential (primary) hypertension: Secondary | ICD-10-CM

## 2014-06-14 DIAGNOSIS — F1910 Other psychoactive substance abuse, uncomplicated: Secondary | ICD-10-CM | POA: Diagnosis not present

## 2014-06-14 DIAGNOSIS — M8949 Other hypertrophic osteoarthropathy, multiple sites: Secondary | ICD-10-CM

## 2014-06-14 MED ORDER — CYANOCOBALAMIN 1000 MCG/ML IJ SOLN
1000.0000 ug | Freq: Once | INTRAMUSCULAR | Status: AC
  Administered 2014-06-14: 1000 ug via INTRAMUSCULAR

## 2014-06-14 MED ORDER — TRAMADOL HCL 50 MG PO TABS: ORAL_TABLET | ORAL | Status: DC

## 2014-06-14 MED ORDER — METHYLPREDNISOLONE ACETATE 40 MG/ML IJ SUSP
40.0000 mg | Freq: Once | INTRAMUSCULAR | Status: AC
  Administered 2014-06-14: 40 mg via INTRA_ARTICULAR

## 2014-06-14 MED ORDER — OLMESARTAN MEDOXOMIL 20 MG PO TABS
20.0000 mg | ORAL_TABLET | Freq: Every day | ORAL | Status: DC
Start: 2014-06-14 — End: 2015-05-16

## 2014-06-15 DIAGNOSIS — G894 Chronic pain syndrome: Secondary | ICD-10-CM | POA: Insufficient documentation

## 2014-06-15 NOTE — Assessment & Plan Note (Signed)
Given her recent problems with her narcotic prescription for Vicodin, I will no longer be prescribing Vicodin for her. I do realize she has chronic arthritic pain. She is now amenable to discussing potential total knee replacement of the other knee. A lot of her arthritis is in her back however. I will try her on tramadol.

## 2014-06-15 NOTE — Progress Notes (Signed)
   Subjective:    Patient ID: [name], female    DOB: 11-17-1945, 69 y.o.   MRN: [id number]  HPI Knee pain. Would like to consider corticosteroid injection #2. Chronic pain. She requests more Vicodin. #3. Elevated blood pressure. She had previously been on HCTZ but was stopped during one of her hospitalizations. She says the geriatrician stopped it. Denies headache, chest pain, lower extremity edema.   Review of Systems No fever, sweats, chills, unusual weight change.    Objective:   Physical Exam  Vital signs are reviewed GEN.: Well-developed female no acute distress NEURO: Resting tremor hand head and mouth.some occasional stuttering and difficulty starting sentences PSYCHIATRIC: Alert nor is 4. Normal interaction. Normal speech content . MSK: Left knee no effusion, no erythema or warmth. Full flexion but the last 5-10 is associated with some significant stiffness. Ligaments intact to varus and valgus stress. Calf is soft. Popliteal space is benign. INJECTION: Patient was given informed consent, signed copy in the chart. Appropriate time out was taken. Area prepped and draped in usual sterile fashion. 1 cc of methylprednisolone 40 mg/ml plus  4 cc of 1% lidocaine without epinephrine was injected into the left knee using a(n) anterior medial approach. The patient tolerated the procedure well. There were no complications. Post procedure instructions were given.       Assessment & Plan:

## 2014-06-15 NOTE — Assessment & Plan Note (Addendum)
CSI left knee today.

## 2014-06-15 NOTE — Assessment & Plan Note (Signed)
Will start Benicar. She had cough with ACE inhibitor so we discussed that. Follow-up one month.

## 2014-06-22 ENCOUNTER — Telehealth: Payer: Self-pay | Admitting: Family Medicine

## 2014-06-22 ENCOUNTER — Ambulatory Visit: Payer: Commercial Managed Care - HMO | Admitting: Pharmacist

## 2014-06-22 NOTE — Telephone Encounter (Signed)
Need a [employer] Referral # for patient's appt today with Dr. Meda Coffee.  Was seen for dx I10 and need the referral to be dated for today 06/22/14.  Can call Pam back at her direct number [phone]

## 2014-06-23 NOTE — Telephone Encounter (Signed)
Authorization has been processed through [facility], authorization is suspended, awaiting response.

## 2014-06-29 ENCOUNTER — Emergency Department (HOSPITAL_COMMUNITY): Payer: Commercial Managed Care - HMO

## 2014-06-29 ENCOUNTER — Telehealth: Payer: Self-pay | Admitting: Family Medicine

## 2014-06-29 ENCOUNTER — Encounter (HOSPITAL_COMMUNITY): Payer: Self-pay | Admitting: *Deleted

## 2014-06-29 ENCOUNTER — Other Ambulatory Visit (HOSPITAL_COMMUNITY): Payer: Self-pay

## 2014-06-29 ENCOUNTER — Inpatient Hospital Stay (HOSPITAL_COMMUNITY)
Admission: EM | Admit: 2014-06-29 | Discharge: 2014-07-01 | DRG: 392 | Disposition: A | Discharge status: Home / Self Care | Payer: Commercial Managed Care - HMO | Attending: Family Medicine | Admitting: Family Medicine

## 2014-06-29 DIAGNOSIS — N183 Chronic kidney disease, stage 3 (moderate): Secondary | ICD-10-CM | POA: Diagnosis present

## 2014-06-29 DIAGNOSIS — R112 Nausea with vomiting, unspecified: Secondary | ICD-10-CM | POA: Insufficient documentation

## 2014-06-29 DIAGNOSIS — J986 Disorders of diaphragm: Secondary | ICD-10-CM | POA: Diagnosis not present

## 2014-06-29 DIAGNOSIS — G3184 Mild cognitive impairment, so stated: Secondary | ICD-10-CM | POA: Diagnosis present

## 2014-06-29 DIAGNOSIS — E876 Hypokalemia: Secondary | ICD-10-CM | POA: Diagnosis present

## 2014-06-29 DIAGNOSIS — N390 Urinary tract infection, site not specified: Secondary | ICD-10-CM | POA: Diagnosis not present

## 2014-06-29 DIAGNOSIS — A084 Viral intestinal infection, unspecified: Principal | ICD-10-CM | POA: Diagnosis present

## 2014-06-29 DIAGNOSIS — I129 Hypertensive chronic kidney disease with stage 1 through stage 4 chronic kidney disease, or unspecified chronic kidney disease: Secondary | ICD-10-CM | POA: Diagnosis present

## 2014-06-29 DIAGNOSIS — E119 Type 2 diabetes mellitus without complications: Secondary | ICD-10-CM | POA: Diagnosis present

## 2014-06-29 DIAGNOSIS — F339 Major depressive disorder, recurrent, unspecified: Secondary | ICD-10-CM | POA: Diagnosis present

## 2014-06-29 DIAGNOSIS — M419 Scoliosis, unspecified: Secondary | ICD-10-CM | POA: Diagnosis present

## 2014-06-29 DIAGNOSIS — Z90710 Acquired absence of both cervix and uterus: Secondary | ICD-10-CM | POA: Diagnosis not present

## 2014-06-29 DIAGNOSIS — G259 Extrapyramidal and movement disorder, unspecified: Secondary | ICD-10-CM | POA: Diagnosis not present

## 2014-06-29 DIAGNOSIS — N179 Acute kidney failure, unspecified: Secondary | ICD-10-CM | POA: Diagnosis present

## 2014-06-29 DIAGNOSIS — F0280 Dementia in other diseases classified elsewhere without behavioral disturbance: Secondary | ICD-10-CM | POA: Diagnosis present

## 2014-06-29 DIAGNOSIS — Z888 Allergy status to other drugs, medicaments and biological substances status: Secondary | ICD-10-CM | POA: Diagnosis not present

## 2014-06-29 DIAGNOSIS — R197 Diarrhea, unspecified: Secondary | ICD-10-CM | POA: Insufficient documentation

## 2014-06-29 DIAGNOSIS — E785 Hyperlipidemia, unspecified: Secondary | ICD-10-CM | POA: Diagnosis present

## 2014-06-29 DIAGNOSIS — M159 Polyosteoarthritis, unspecified: Secondary | ICD-10-CM | POA: Diagnosis present

## 2014-06-29 DIAGNOSIS — G20 Parkinson's disease: Secondary | ICD-10-CM | POA: Diagnosis present

## 2014-06-29 DIAGNOSIS — Z96651 Presence of right artificial knee joint: Secondary | ICD-10-CM | POA: Diagnosis present

## 2014-06-29 DIAGNOSIS — G894 Chronic pain syndrome: Secondary | ICD-10-CM | POA: Diagnosis present

## 2014-06-29 DIAGNOSIS — E860 Dehydration: Secondary | ICD-10-CM

## 2014-06-29 DIAGNOSIS — F419 Anxiety disorder, unspecified: Secondary | ICD-10-CM | POA: Diagnosis present

## 2014-06-29 DIAGNOSIS — R4182 Altered mental status, unspecified: Secondary | ICD-10-CM | POA: Diagnosis not present

## 2014-06-29 HISTORY — DX: Adverse effect of unspecified anesthetic, initial encounter: T41.45XA

## 2014-06-29 HISTORY — DX: Parkinson's disease: G20

## 2014-06-29 HISTORY — DX: Other chronic pain: G89.29

## 2014-06-29 HISTORY — DX: Unspecified chronic bronchitis: J42

## 2014-06-29 HISTORY — DX: Type 2 diabetes mellitus without complications: E11.9

## 2014-06-29 HISTORY — DX: Pain in thoracic spine: M54.6

## 2014-06-29 HISTORY — DX: Adjustment disorder with depressed mood: F43.21

## 2014-06-29 HISTORY — DX: Personal history of other diseases of the musculoskeletal system and connective tissue: Z87.39

## 2014-06-29 HISTORY — DX: Dorsalgia, unspecified: M54.9

## 2014-06-29 HISTORY — DX: Parkinson's disease without dyskinesia, without mention of fluctuations: G20.A1

## 2014-06-29 HISTORY — DX: Gastro-esophageal reflux disease without esophagitis: K21.9

## 2014-06-29 LAB — URINALYSIS, ROUTINE W REFLEX MICROSCOPIC
Glucose, UA: NEGATIVE mg/dL
Ketones, ur: 15 mg/dL — AB
Nitrite: POSITIVE — AB
Protein, ur: 30 mg/dL — AB
Specific Gravity, Urine: 1.03 (ref 1.005–1.030)
Urobilinogen, UA: 1 mg/dL (ref 0.0–1.0)
pH: 5.5 (ref 5.0–8.0)

## 2014-06-29 LAB — HEPATIC FUNCTION PANEL
ALT: 12 U/L (ref 0–35)
AST: 24 U/L (ref 0–37)
Albumin: 4.2 g/dL (ref 3.5–5.2)
Alkaline Phosphatase: 90 U/L (ref 39–117)
Bilirubin, Direct: 0.1 mg/dL (ref 0.0–0.5)
Indirect Bilirubin: 0.9 mg/dL (ref 0.3–0.9)
Total Bilirubin: 1 mg/dL (ref 0.3–1.2)
Total Protein: 7.6 g/dL (ref 6.0–8.3)

## 2014-06-29 LAB — GLUCOSE, CAPILLARY: Glucose-Capillary: 123 mg/dL — ABNORMAL HIGH (ref 70–99)

## 2014-06-29 LAB — CBC
HCT: 42.4 % (ref 36.0–46.0)
Hemoglobin: 14.3 g/dL (ref 12.0–15.0)
MCH: 30.4 pg (ref 26.0–34.0)
MCHC: 33.7 g/dL (ref 30.0–36.0)
MCV: 90.2 fL (ref 78.0–100.0)
Platelets: 381 10*3/uL (ref 150–400)
RBC: 4.7 MIL/uL (ref 3.87–5.11)
RDW: 12.9 % (ref 11.5–15.5)
WBC: 11.6 10*3/uL — ABNORMAL HIGH (ref 4.0–10.5)

## 2014-06-29 LAB — URINE MICROSCOPIC-ADD ON

## 2014-06-29 LAB — BASIC METABOLIC PANEL
Anion gap: 14 (ref 5–15)
BUN: 16 mg/dL (ref 6–23)
CO2: 28 mmol/L (ref 19–32)
Calcium: 9.1 mg/dL (ref 8.4–10.5)
Chloride: 97 mmol/L (ref 96–112)
Creatinine, Ser: 1.35 mg/dL — ABNORMAL HIGH (ref 0.50–1.10)
GFR calc Af Amer: 45 mL/min — ABNORMAL LOW (ref >90)
GFR calc non Af Amer: 39 mL/min — ABNORMAL LOW (ref >90)
Glucose, Bld: 187 mg/dL — ABNORMAL HIGH (ref 70–99)
Potassium: 2.5 mmol/L — CL (ref 3.5–5.1)
Sodium: 139 mmol/L (ref 135–145)

## 2014-06-29 LAB — CBG MONITORING, ED
Glucose-Capillary: 167 mg/dL — ABNORMAL HIGH (ref 70–99)
Glucose-Capillary: 154 mg/dL — ABNORMAL HIGH (ref 70–99)

## 2014-06-29 LAB — MAGNESIUM: Magnesium: 1.6 mg/dL (ref 1.5–2.5)

## 2014-06-29 LAB — I-STAT CG4 LACTIC ACID, ED
Lactic Acid, Venous: 1.86 mmol/L (ref 0.5–2.0)
Lactic Acid, Venous: 2 mmol/L (ref 0.5–2.0)

## 2014-06-29 LAB — LIPASE, BLOOD: Lipase: 47 U/L (ref 11–59)

## 2014-06-29 MED ORDER — ASPIRIN 81 MG PO TABS: 81.0000 mg | ORAL_TABLET | Freq: Every day | ORAL | Status: DC

## 2014-06-29 MED ORDER — ACETAMINOPHEN 650 MG RE SUPP: 650.0000 mg | Freq: Four times a day (QID) | RECTAL | Status: DC | PRN

## 2014-06-29 MED ORDER — ACETAMINOPHEN 325 MG PO TABS
650.0000 mg | ORAL_TABLET | Freq: Four times a day (QID) | ORAL | Status: DC | PRN
Start: 2014-06-29 — End: 2014-07-01
  Administered 2014-06-29: 650 mg via ORAL
  Filled 2014-06-29: qty 2

## 2014-06-29 MED ORDER — SODIUM CHLORIDE 0.9 % IV BOLUS (SEPSIS)
1000.0000 mL | Freq: Once | INTRAVENOUS | Status: AC
  Administered 2014-06-29: 1000 mL via INTRAVENOUS

## 2014-06-29 MED ORDER — INSULIN ASPART 100 UNIT/ML ~~LOC~~ SOLN
0.0000 [IU] | Freq: Three times a day (TID) | SUBCUTANEOUS | Status: DC
  Administered 2014-06-30 (×2): 1 [IU] via SUBCUTANEOUS

## 2014-06-29 MED ORDER — DEXTROSE 5 % IV SOLN
1.0000 g | Freq: Once | INTRAVENOUS | Status: AC
  Administered 2014-06-29: 1 g via INTRAVENOUS
  Filled 2014-06-29: qty 10

## 2014-06-29 MED ORDER — ONDANSETRON HCL 4 MG/2ML IJ SOLN
4.0000 mg | Freq: Once | INTRAMUSCULAR | Status: AC
  Administered 2014-06-29: 4 mg via INTRAVENOUS
  Filled 2014-06-29: qty 2

## 2014-06-29 MED ORDER — GABAPENTIN 300 MG PO CAPS
600.0000 mg | ORAL_CAPSULE | Freq: Two times a day (BID) | ORAL | Status: DC
  Administered 2014-06-29 – 2014-07-01 (×4): 600 mg via ORAL
  Filled 2014-06-29 (×4): qty 2

## 2014-06-29 MED ORDER — SODIUM CHLORIDE 0.9 % IV SOLN
INTRAVENOUS | Status: DC
  Administered 2014-06-29 – 2014-06-30 (×2): via INTRAVENOUS

## 2014-06-29 MED ORDER — PROPRANOLOL HCL ER 80 MG PO CP24: 80.0000 mg | ORAL_CAPSULE | Freq: Every day | ORAL | Status: DC

## 2014-06-29 MED ORDER — ATORVASTATIN CALCIUM 80 MG PO TABS
80.0000 mg | ORAL_TABLET | Freq: Every day | ORAL | Status: DC
  Administered 2014-06-30 – 2014-07-01 (×2): 80 mg via ORAL
  Filled 2014-06-29 (×2): qty 1

## 2014-06-29 MED ORDER — POTASSIUM CHLORIDE 10 MEQ/100ML IV SOLN
10.0000 meq | Freq: Once | INTRAVENOUS | Status: AC
  Administered 2014-06-29: 10 meq via INTRAVENOUS
  Filled 2014-06-29: qty 100

## 2014-06-29 MED ORDER — CEFTRIAXONE SODIUM IN DEXTROSE 20 MG/ML IV SOLN
1.0000 g | INTRAVENOUS | Status: DC
  Filled 2014-06-29: qty 50

## 2014-06-29 MED ORDER — POTASSIUM CHLORIDE 20 MEQ/15ML (10%) PO SOLN
40.0000 meq | Freq: Once | ORAL | Status: AC
  Administered 2014-06-29: 40 meq via ORAL
  Filled 2014-06-29: qty 30

## 2014-06-29 MED ORDER — ASPIRIN EC 81 MG PO TBEC
81.0000 mg | DELAYED_RELEASE_TABLET | Freq: Every day | ORAL | Status: DC
  Administered 2014-06-30 – 2014-07-01 (×2): 81 mg via ORAL
  Filled 2014-06-29 (×2): qty 1

## 2014-06-29 MED ORDER — IRBESARTAN 150 MG PO TABS
150.0000 mg | ORAL_TABLET | Freq: Every day | ORAL | Status: DC
  Administered 2014-06-30 – 2014-07-01 (×2): 150 mg via ORAL
  Filled 2014-06-29 (×2): qty 1

## 2014-06-29 MED ORDER — CARBIDOPA-LEVODOPA 25-100 MG PO TABS
1.5000 | ORAL_TABLET | Freq: Three times a day (TID) | ORAL | Status: DC
  Administered 2014-06-29 – 2014-07-01 (×5): 1.5 via ORAL
  Filled 2014-06-29 (×5): qty 2

## 2014-06-29 MED ORDER — SERTRALINE HCL 100 MG PO TABS
100.0000 mg | ORAL_TABLET | Freq: Every day | ORAL | Status: DC
Start: 2014-06-30 — End: 2014-07-01
  Administered 2014-06-30 – 2014-07-01 (×2): 100 mg via ORAL
  Filled 2014-06-29 (×2): qty 1

## 2014-06-29 MED ORDER — PANTOPRAZOLE SODIUM 40 MG PO TBEC
80.0000 mg | DELAYED_RELEASE_TABLET | Freq: Every day | ORAL | Status: DC
  Administered 2014-06-30 – 2014-07-01 (×2): 80 mg via ORAL
  Filled 2014-06-29 (×2): qty 2

## 2014-06-29 MED ORDER — HEPARIN SODIUM (PORCINE) 5000 UNIT/ML IJ SOLN
5000.0000 [IU] | Freq: Three times a day (TID) | INTRAMUSCULAR | Status: DC
  Administered 2014-06-30 – 2014-07-01 (×4): 5000 [IU] via SUBCUTANEOUS
  Filled 2014-06-29 (×2): qty 1

## 2014-06-29 MED ORDER — ALUM & MAG HYDROXIDE-SIMETH 200-200-20 MG/5ML PO SUSP
15.0000 mL | Freq: Once | ORAL | Status: AC
  Administered 2014-06-29: 15 mL via ORAL
  Filled 2014-06-29: qty 30

## 2014-06-29 NOTE — H&P (Signed)
[facility] Admission History and Physical Service Pager: [phone]  [name] Medical record number: [id number] Date of birth: Aug 13, 1945 Age: 69 y.o. Gender: female  Primary Care Provider: [name], MD Consultants: none Code Status: Full   Chief Complaint: diarrhea and vomiting, dehydration   Assessment and Plan: [name] is a 69 y.o. female presenting with diarrhea and vomiting. PMH is significant for DMII, MDD, Esophageal Stricture, HLD, Parkinson's Disease, HTN, Anxiety.  #Nausea/Vomiting/Diarrhea: most likley 2/2 a viral gatroenteritis. She denies any recent ABX use, no new foods and the movements are watery in nature. Daughter reports that she appears confused but she was alert and oriented on exam. Lactic acid trending up to high end of normal. Has a history of Esophageal stricture and surgery but this was done several years ago.  - admit to med-surg, Dr. Lindell Noe attending  - clear liquid diet  - NS 100 mL/hr; given 1L bolus in ED.  - zofran IV  - C diff PCR  - enteric precautions.   #UTI: positive for nitrites and leukocytes. Foul smelling urine with some suprapubic tenderness on exam. Hx of growing E.Coli in culture  - CTX IV  - urine culture.   #AKI/CKD stage III: Scr baseline 1.0 - 0.9. Most likely related to the fluid losses - giving fluids  - BMP AM   #Hypokalemia: associated with the diarrhea.  - KCl 40 mEq soln in ED, IV PB 10 mEq x 1 in ED  - Will give runs of K due to vomiting  - BMP AM   #Parkinson's Disease: Pt. States that her baseline level of functioning is fair.   - Continue sinemet at current dosing  - Continue propranolol 80mg  qd.   #Major Depression / Anxiety: appears stable  - Continue Zoloft 100 mg qd.   #HTN/HLD:  - olmesartan 20 mg daily   #DMII - Last A1C 6.6, on Metformin - hold home metformin   FEN/GI: NS 100 mL/hr/ Clear liquids  Prophylaxis: hep subQ   Disposition:  admitted to family medicine teaching service for viral illness.   History of Present Illness: [name] is a 69 y.o. female presenting with nausea, vomiting and diearrha. These symptoms started two weeks ago. She denies any recent antibiotic use or abdominal pain. She has had a sick contact but no fevers or chills. She has been staying in her bed more. She has some incontinence but it's due that she can't make it to the restroom in time. She reports the movements are watery but not foul smelling. Her movements remained unchanged. She is having some nausea and vomiting. She has not been started on any new medications and hasn't been able to take medications recently due to the vomiting. Her appetite has been down as well due to this illness. She lives at home and takes care of her disabled son. She was admitted in November with a UTI but it was much worse at that time.   Review Of Systems: Per HPI with the following additions: See HPI  Otherwise 12 point review of systems was performed and was unremarkable.  Patient Active Problem List   Diagnosis Date Noted  . Dehydration 06/29/2014  . Chronic pain syndrome 06/15/2014  . Left knee pain 05/17/2014  . Pain management 03/08/2014  . Urticaria, idiopathic 12/21/2013  . Cognitive impairment 12/11/2013  . Dysphagia, unspecified(787.20) 06/02/2013  . Parkinson's disease 01/18/2013  . S/P TKR (total knee replacement) 06/09/2012  . Episodic  substance abuse 09/20/2010  . Neoplasm of uncertain behavior 37/62/8315  . Esophageal abnormality 08/12/2010  . Anemia 01/16/2010  . ARTHRITIS, BACK 03/28/2009  . SCOLIOSIS 03/28/2009  . ROTATOR CUFF SYNDROME 01/24/2009  . CARPAL TUNNEL SYNDROME, LEFT 01/05/2008  . Diabetes mellitus without complication 17/61/6073  . Hyperlipidemia 08/05/2006  . Essential hypertension 08/05/2006  . Osteoarthritis of multiple joints 08/04/2006  . Major depressive disorder, recurrent episode 06/11/2006   Past Medical  History: Past Medical History  Diagnosis Date  . Allergy   . Anxiety   . Arthritis   . Depression   . Diabetes mellitus   . Hyperlipidemia   . Movement disorder   . Hypertension   . Scoliosis   . Esophageal stricture   . Osteoarthritis   . Cognitive impairment 12/11/2013  . Parkinson's disease    Past Surgical History: Past Surgical History  Procedure Laterality Date  . Total knee arthroplasty      right  . Esophagogastric fundoplasty      some type "esoph surgery" per pt  . Colon surgery    . Abdominal hysterectomy     Social History: History  Substance Use Topics  . Smoking status: Never Smoker   . Smokeless tobacco: Never Used  . Alcohol Use: No   Additional social history: none  Please also refer to relevant sections of EMR.  Family History: Family History  Problem Relation Age of Onset  . Cerebral palsy Son   . Heart disease Mother   . Diabetes Mother   . Heart disease Father    Allergies and Medications: Allergies  Allergen Reactions  . Ace Inhibitors Anaphylaxis and Swelling  . Azilect [Rasagiline Mesylate] Other (See Comments)    hypotension  . Lisinopril Swelling    Severe facial angioedema requiring hospitalization 2007 (approx)   No current facility-administered medications on file prior to encounter.   Current Outpatient Prescriptions on File Prior to Encounter  Medication Sig Dispense Refill  . aspirin 81 MG tablet Take 1 tablet (81 mg total) by mouth daily. 30 tablet   . gabapentin (NEURONTIN) 300 MG capsule Take 600 mg by mouth 2 (two) times daily.     . metFORMIN (GLUCOPHAGE) 850 MG tablet Take 1 tablet (850 mg total) by mouth daily with breakfast. 90 tablet 3  . omeprazole (PRILOSEC) 40 MG capsule Take 40 mg by mouth daily.   0  . sertraline (ZOLOFT) 100 MG tablet Take 100 mg by mouth daily.     . traMADol (ULTRAM) 50 MG tablet 1-2 tabs po bid prn pain 120 tablet 1  . atorvastatin (LIPITOR) 80 MG tablet Take 1 tablet (80 mg total) by  mouth daily. 90 tablet 3  . carbidopa-levodopa (SINEMET) 25-100 MG per tablet Take 2 tablets by mouth 3 (three) times daily. (Patient taking differently: Take 1.5 tablets by mouth 3 (three) times daily. ) 180 tablet 12  . eszopiclone (LUNESTA) 2 MG TABS tablet Take 2 mg by mouth.  5  . olmesartan (BENICAR) 20 MG tablet Take 1 tablet (20 mg total) by mouth daily. 30 tablet 12  . propranolol ER (INDERAL LA) 80 MG 24 hr capsule Take 1 capsule (80 mg total) by mouth daily. 90 capsule 3    Objective: BP 166/71 mmHg  Pulse 100  Temp(Src) 98.8 F (37.1 C) (Oral)  Resp 25  SpO2 94% Exam: General: Alert and NAD  HEENT: EOMI, PERRL, tacky MM,  Cardiovascular: tachycardic, regular rhythm, S1S2, no murmurs  Respiratory: CTAB, normal effort,  Abdomen:  soft, NTND, no guarding or rigidity  GU: suprapubic tenderness on palpation  Extremities: moves all freely  Skin: no rashes  Neuro: Alert and oriented x 3, resting tremor, no gross deficits.   Labs and Imaging: CBC BMET   Recent Labs Lab 06/29/14 1251  WBC 11.6*  HGB 14.3  HCT 42.4  PLT 381    Recent Labs Lab 06/29/14 1251  NA 139  K 2.5*  CL 97  CO2 28  BUN 16  CREATININE 1.35*  GLUCOSE 187*  CALCIUM 9.1       Rosemarie Ax, MD 06/29/2014, 4:36 PM PGY-2, [place] pager: [phone], text pages welcome

## 2014-06-29 NOTE — Telephone Encounter (Signed)
Pt is having the same symptons she had when she went into the hospital in Dec. Confused, vomiting, diarrhea and some blood when vomiting Daughter would like to talk to dr neal about this.

## 2014-06-29 NOTE — Telephone Encounter (Signed)
After speaking with Dr. Nori Riis, I called patient's daughter and advised her to go to the ER. Daughter stated that it is not as bad as December, but now is starting to get to that point.

## 2014-06-29 NOTE — ED Notes (Signed)
Patient returned from xray.

## 2014-06-29 NOTE — ED Notes (Signed)
Pt is here for vomiting and diarrhea since 3/7.  Pt has irritated throat from vomiting.  Pt complains of mid back pain.  PT has had urine incontinence and notices a strong odor to urine.  Pt states that she has noticed some blood with vomiting.  Pt family reports some confusion.  Pt knows year and follows instruction.  Pt is diabetic

## 2014-06-29 NOTE — ED Notes (Signed)
Lab called critical lab potassium 2.5.

## 2014-06-29 NOTE — ED Notes (Signed)
Called lab to add on Mag, lipase and hepatic function. Lab able to add on

## 2014-06-29 NOTE — ED Provider Notes (Signed)
CSN: [id number]     Arrival date & time 06/29/14  1154 History   First MD Initiated Contact with Patient 06/29/14 1353     Chief Complaint  Patient presents with  . Emesis  . Diarrhea  . Altered Mental Status     (Consider location/radiation/quality/duration/timing/severity/associated sxs/prior Treatment) HPI   This is a 69 year old female she has a past medical history of diabetes, hyperlipidemia, hypertension, Parkinson's disease.   She comes in today because of nausea vomiting and diarrhea says that for the last approximately 2 weeks she's been having nonbloody diarrhea, nonbloody/nonbilious emesis. No fevers, she denies any abdominal pain or dysuria but says that her urine has foul odor. She also notes that sometimes she feels that his difficulty swallowing her food she's had previous esophageal strictures in the past.  Past Medical History  Diagnosis Date  . Allergy   . Anxiety   . Arthritis   . Depression   . Diabetes mellitus   . Hyperlipidemia   . Movement disorder   . Hypertension   . Scoliosis   . Esophageal stricture   . Osteoarthritis   . Cognitive impairment 12/11/2013  . Parkinson's disease    Past Surgical History  Procedure Laterality Date  . Total knee arthroplasty      right  . Esophagogastric fundoplasty      some type "esoph surgery" per pt  . Colon surgery    . Abdominal hysterectomy     Family History  Problem Relation Age of Onset  . Cerebral palsy Son   . Heart disease Mother   . Diabetes Mother   . Heart disease Father    History  Substance Use Topics  . Smoking status: Never Smoker   . Smokeless tobacco: Never Used  . Alcohol Use: No   OB History    No data available     Review of Systems  Constitutional: Negative for fever and chills.  HENT: Negative for nosebleeds.   Eyes: Negative for visual disturbance.  Respiratory: Negative for cough and shortness of breath.   Cardiovascular: Negative for chest pain.  Gastrointestinal:  Positive for nausea, vomiting, abdominal pain and diarrhea. Negative for constipation.  Genitourinary: Negative for dysuria.  Skin: Negative for rash.  Neurological: Negative for weakness.  All other systems reviewed and are negative.     Allergies  Ace inhibitors; Azilect; and Lisinopril  Home Medications   Prior to Admission medications   Medication Sig Start Date End Date Taking? Authorizing Provider  aspirin 81 MG tablet Take 1 tablet (81 mg total) by mouth daily. 12/16/13   Zenia Resides, MD  atorvastatin (LIPITOR) 80 MG tablet Take 1 tablet (80 mg total) by mouth daily. 06/13/14   Dorothy Spark, MD  carbidopa-levodopa (SINEMET) 25-100 MG per tablet Take 2 tablets by mouth 3 (three) times daily. 12/26/13   Penni Bombard, MD  eszopiclone (LUNESTA) 2 MG TABS tablet Take 2 mg by mouth. 05/24/14   Historical Provider, MD  gabapentin (NEURONTIN) 300 MG capsule Take 300 mg by mouth 3 (three) times daily. 10/13/13   Historical Provider, MD  metFORMIN (GLUCOPHAGE) 850 MG tablet Take 1 tablet (850 mg total) by mouth daily with breakfast. 02/10/14   [name], MD  olmesartan (BENICAR) 20 MG tablet Take 1 tablet (20 mg total) by mouth daily. 06/14/14   [name], MD  omeprazole (PRILOSEC) 40 MG capsule Take 40 mg by mouth daily as needed (takes one cap by mouth daily as needed  for acid reflux).  03/23/14   Historical Provider, MD  propranolol ER (INDERAL LA) 80 MG 24 hr capsule Take 1 capsule (80 mg total) by mouth daily. 10/06/12   [name], MD  sertraline (ZOLOFT) 100 MG tablet Take 100 mg by mouth daily.  12/29/13   Historical Provider, MD  traMADol (ULTRAM) 50 MG tablet 1-2 tabs po bid prn pain 06/14/14   [name], MD   BP 163/108 mmHg  Pulse 117  Temp(Src) 98.8 F (37.1 C) (Oral)  Resp 18  SpO2 95% Physical Exam  Constitutional: She is oriented to person, place, and time. No distress.  HENT:  Head: Normocephalic and atraumatic.  Eyes: EOM are normal. Pupils are equal,  round, and reactive to light.  Neck: Normal range of motion. Neck supple.  Cardiovascular: Intact distal pulses.   Pulmonary/Chest: No respiratory distress.  Abdominal: Soft. There is no tenderness. There is no rebound and no guarding.  Musculoskeletal: Normal range of motion.  Neurological: She is alert and oriented to person, place, and time.  Skin: No rash noted. She is not diaphoretic.  Psychiatric: She has a normal mood and affect.  Vitals reviewed.   ED Course  Procedures (including critical care time) Labs Review Labs Reviewed  CBC - Abnormal; Notable for the following:    WBC 11.6 (*)    All other components within normal limits  BASIC METABOLIC PANEL - Abnormal; Notable for the following:    Potassium 2.5 (*)    Glucose, Bld 187 (*)    Creatinine, Ser 1.35 (*)    GFR calc non Af Amer 39 (*)    GFR calc Af Amer 45 (*)    All other components within normal limits  CBG MONITORING, ED - Abnormal; Notable for the following:    Glucose-Capillary 167 (*)    All other components within normal limits  URINE CULTURE  URINALYSIS, ROUTINE W REFLEX MICROSCOPIC  I-STAT CG4 LACTIC ACID, ED    Imaging Review No results found.   EKG Interpretation None      MDM   Final diagnoses:  None    This is a 69 year old female she is a past medical history of diabetes, hyperlipidemia, hypertension, Parkinson's disease. She comes in today because of nausea vomiting and diarrhea says that for the last approximately 2 weeks  Exam as above, mild tachycardia, afebrile, good bp, satting well on room air, abdomen soft.  Labs obtained and sig for hypokalemia.   Will replace K.  Will obtain LFTs and lipase.  Will obtain mag given hypokalemia.  Currently awaiting urine, LFT's, lipase  paitent will be admitted to family medicine for dehydration.  I have spoken with the family medicine residents and they will come evaluate the patient.  Have given NS and zofran  Doubt dangerous surgical  cause of nausea/vomiting given benign abdominal exam.      Jarome Matin, MD 06/29/14 [place], MD 07/08/14 [phone]

## 2014-06-30 DIAGNOSIS — R112 Nausea with vomiting, unspecified: Secondary | ICD-10-CM

## 2014-06-30 DIAGNOSIS — R197 Diarrhea, unspecified: Secondary | ICD-10-CM | POA: Insufficient documentation

## 2014-06-30 LAB — BASIC METABOLIC PANEL
Anion gap: 6 (ref 5–15)
BUN: 12 mg/dL (ref 6–23)
CO2: 31 mmol/L (ref 19–32)
Calcium: 8 mg/dL — ABNORMAL LOW (ref 8.4–10.5)
Chloride: 104 mmol/L (ref 96–112)
Creatinine, Ser: 1.14 mg/dL — ABNORMAL HIGH (ref 0.50–1.10)
GFR calc Af Amer: 56 mL/min — ABNORMAL LOW (ref >90)
GFR calc non Af Amer: 48 mL/min — ABNORMAL LOW (ref >90)
Glucose, Bld: 115 mg/dL — ABNORMAL HIGH (ref 70–99)
Potassium: 3.6 mmol/L (ref 3.5–5.1)
Sodium: 141 mmol/L (ref 135–145)

## 2014-06-30 LAB — CBC
HCT: 37.4 % (ref 36.0–46.0)
Hemoglobin: 12.2 g/dL (ref 12.0–15.0)
MCH: 30.3 pg (ref 26.0–34.0)
MCHC: 32.6 g/dL (ref 30.0–36.0)
MCV: 92.8 fL (ref 78.0–100.0)
Platelets: 271 10*3/uL (ref 150–400)
RBC: 4.03 MIL/uL (ref 3.87–5.11)
RDW: 13.1 % (ref 11.5–15.5)
WBC: 8 10*3/uL (ref 4.0–10.5)

## 2014-06-30 LAB — GLUCOSE, CAPILLARY
Glucose-Capillary: 147 mg/dL — ABNORMAL HIGH (ref 70–99)
Glucose-Capillary: 117 mg/dL — ABNORMAL HIGH (ref 70–99)
Glucose-Capillary: 123 mg/dL — ABNORMAL HIGH (ref 70–99)
Glucose-Capillary: 141 mg/dL — ABNORMAL HIGH (ref 70–99)

## 2014-06-30 MED ORDER — CEPHALEXIN 250 MG PO CAPS
250.0000 mg | ORAL_CAPSULE | Freq: Four times a day (QID) | ORAL | Status: DC
  Administered 2014-06-30 – 2014-07-01 (×3): 250 mg via ORAL
  Filled 2014-06-30 (×7): qty 1

## 2014-06-30 NOTE — Progress Notes (Signed)
Family Medicine Teaching Service Daily Progress Note Intern Pager: [phone]  [name] Medical record number: [id number] Date of birth: 10/20/1945 Age: 69 y.o. Gender: female  Primary Care Provider: [name], MD Consultants: None Code Status: Full  Pt Overview and Major Events to Date:  3/17-3/18: Pt. Admitted with Nausea / Vomiting / Diarrhea. Self-Limited.   Assessment and Plan: [name] is a 69 y.o. female presenting with diarrhea and vomiting. PMH is significant for DMII, MDD, Esophageal Stricture, HLD, Parkinson's Disease, HTN, Anxiety.  #Nausea/Vomiting/Diarrhea: most likley 2/2 a viral gatroenteritis. She denies any recent ABX use, no new foods and the movements are watery in nature. Self-Limited. Pt. Reports significant improvement in symptoms today.   - admit to med-surg - Advancing diet as tolerated today.  - KVO IVF.  - zofran IV prn for nausea.  - C diff PCR pending - enteric precautions.   #UTI: positive for nitrites and leukocytes. Foul smelling urine with some suprapubic tenderness on exam. Hx of growing E.Coli in culture  - CTX IV > switched to Keflex. Will complete 5 day course.  - urine culture pending.   #AKI/CKD stage III: Scr baseline 1.0 - 0.9. Most likely related to the fluid losses - Creatinine improving today.  - Consider BMP in the am.  - IVF kvo'd. Pt. Taking good PO.   #Hypokalemia: associated with the diarrhea.  - Potassium normal at 3.6 this am.  - N/V/D resolved.  - Consider BMP in the am if n/v/d resume.   #Parkinson's Disease: Pt. 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   - Continue sinemet at current dosing  - Continue propranolol 80mg  qd.   #Major Depression / Anxiety: appears stable  - Continue Zoloft 100 mg qd.   #HTN/HLD:  - olmesartan 20 mg daily   #DMII - Last  A1C 6.6, on Metformin - hold home metformin   FEN/GI: KVO / Regular diet.  Prophylaxis: hep subQ   Disposition: Discharge in the am.   Subjective:  Pt. Reports significant improvement in her nausea / vomiting / diarrhea overnight. She says that her symptoms have nearly resolved. She feels much better, and is agreeable to trying to advance her diet today.   Objective: Temp:  [98.2 F (36.8 C)-98.9 F (37.2 C)] 98.9 F (37.2 C) (03/17 2145) Pulse Rate:  [88-100] 88 (03/17 2145) Resp:  [16-25] 19 (03/17 2145) BP: (134-166)/(50-78) 147/64 mmHg (03/17 2145) SpO2:  [92 %-97 %] 94 % (03/17 2145) Weight:  [195 lb 8.8 oz (88.7 kg)] 195 lb 8.8 oz (88.7 kg) (03/18 0749) Physical Exam: General: NAD, AAOx3 Cardiovascular: RRR, No MGR, normal S1/S2.  Respiratory: CTA Bilaterally, appropriate rate, unlabored.  Abdomen: S, NT, ND, +BS Extremities: WWP, 2+ distal pulses, MAEW.   Laboratory:  Recent Labs Lab 06/29/14 1251 06/30/14 0430  WBC 11.6* 8.0  HGB 14.3 12.2  HCT 42.4 37.4  PLT 381 271    Recent Labs Lab 06/29/14 1240 06/29/14 1251 06/30/14 0430  NA  --  139 141  K  --  2.5* 3.6  CL  --  97 104  CO2  --  28 31  BUN  --  16 12  CREATININE  --  1.35* 1.14*  CALCIUM  --  9.1 8.0*  PROT 7.6  --   --   BILITOT 1.0  --   --   Arabella Merles  90  --   --   ALT 12  --   --   AST 24  --   --   GLUCOSE  --  187* 115*   Lactic Acid - 2  Urinalysis    Component Value Date/Time   COLORURINE AMBER* 06/29/2014 1355   APPEARANCEUR TURBID* 06/29/2014 1355   LABSPEC 1.030 06/29/2014 1355   PHURINE 5.5 06/29/2014 1355   GLUCOSEU NEGATIVE 06/29/2014 1355   HGBUR SMALL* 06/29/2014 1355   HGBUR trace-lysed 06/28/2008 0900   BILIRUBINUR SMALL* 06/29/2014 1355   BILIRUBINUR NEG 03/03/2014 1034   KETONESUR 15* 06/29/2014 1355   PROTEINUR 30* 06/29/2014 1355   PROTEINUR NEG 03/03/2014 1034   UROBILINOGEN 1.0 06/29/2014 1355   UROBILINOGEN 0.2 03/03/2014 1034   NITRITE POSITIVE*  06/29/2014 1355   NITRITE NEG 03/03/2014 1034   LEUKOCYTESUR SMALL* 06/29/2014 1355   Urine Cx - Pending.   Imaging/Diagnostic Tests:  CXR 3/17: No active cardiopulmonary disease.   Aquilla Hacker, MD 06/30/2014, 3:45 PM PGY-1, [place] Intern pager: [phone], text pages welcome

## 2014-06-30 NOTE — Progress Notes (Signed)
FMTS Attending Note Patient seen and examined by me today, discussed with resident team.  Patient is remarkably improved since admission. She has not had any further nausea/vomiting or diarrhea. She is tolerating a clear liquid diet and denies abdominal pain. Her abdominal exam is soft and nontender.  Plan to advance diet today, hold IVF in anticipation of possible discharge to home in the AM on Saturday, March 19. Dalbert Mayotte, MD

## 2014-07-01 LAB — URINE CULTURE: Colony Count: >=100000

## 2014-07-01 LAB — GLUCOSE, CAPILLARY: Glucose-Capillary: 123 mg/dL — ABNORMAL HIGH (ref 70–99)

## 2014-07-01 MED ORDER — CEPHALEXIN 500 MG PO CAPS: 500.0000 mg | ORAL_CAPSULE | Freq: Two times a day (BID) | ORAL | Status: DC

## 2014-07-01 NOTE — Progress Notes (Signed)
Discharge home. Home discharge instruction given, no questions verbalized. 

## 2014-07-01 NOTE — Discharge Instructions (Signed)
You were admitted for dehydration related to vomiting and diarrhea likely caused by a viral illness. Please continue to advance your diet as tolerated.   Viral Gastroenteritis Viral gastroenteritis is also known as stomach flu. This condition affects the stomach and intestinal tract. It can cause sudden diarrhea and vomiting. The illness typically lasts 3 to 8 days. Most people develop an immune response that eventually gets rid of the virus. While this natural response develops, the virus can make you quite ill. CAUSES  Many different viruses can cause gastroenteritis, such as rotavirus or noroviruses. You can catch one of these viruses by consuming contaminated food or water. You may also catch a virus by sharing utensils or other personal items with an infected person or by touching a contaminated surface. SYMPTOMS  The most common symptoms are diarrhea and vomiting. These problems can cause a severe loss of body fluids (dehydration) and a body salt (electrolyte) imbalance. Other symptoms may include:  Fever.  Headache.  Fatigue.  Abdominal pain. DIAGNOSIS  Your caregiver can usually diagnose viral gastroenteritis based on your symptoms and a physical exam. A stool sample may also be taken to test for the presence of viruses or other infections. TREATMENT  This illness typically goes away on its own. Treatments are aimed at rehydration. The most serious cases of viral gastroenteritis involve vomiting so severely that you are not able to keep fluids down. In these cases, fluids must be given through an intravenous line (IV). HOME CARE INSTRUCTIONS   Drink enough fluids to keep your urine clear or pale yellow. Drink small amounts of fluids frequently and increase the amounts as tolerated.  Ask your caregiver for specific rehydration instructions.  Avoid:  Foods high in sugar.  Alcohol.  Carbonated drinks.  Tobacco.  Juice.  Caffeine drinks.  Extremely hot or cold  fluids.  Fatty, greasy foods.  Too much intake of anything at one time.  Dairy products until 24 to 48 hours after diarrhea stops.  You may consume probiotics. Probiotics are active cultures of beneficial bacteria. They may lessen the amount and number of diarrheal stools in adults. Probiotics can be found in yogurt with active cultures and in supplements.  Wash your hands well to avoid spreading the virus.  Only take over-the-counter or prescription medicines for pain, discomfort, or fever as directed by your caregiver. Do not give aspirin to children. Antidiarrheal medicines are not recommended.  Ask your caregiver if you should continue to take your regular prescribed and over-the-counter medicines.  Keep all follow-up appointments as directed by your caregiver. SEEK IMMEDIATE MEDICAL CARE IF:   You are unable to keep fluids down.  You do not urinate at least once every 6 to 8 hours.  You develop shortness of breath.  You notice blood in your stool or vomit. This may look like coffee grounds.  You have abdominal pain that increases or is concentrated in one small area (localized).  You have persistent vomiting or diarrhea.  You have a fever.  The patient is a child younger than 3 months, and he or she has a fever.  The patient is a child older than 3 months, and he or she has a fever and persistent symptoms.  The patient is a child older than 3 months, and he or she has a fever and symptoms suddenly get worse.  The patient is a baby, and he or she has no tears when crying. MAKE SURE YOU:   Understand these instructions.  Will watch  your condition.  Will get help right away if you are not doing well or get worse. Document Released: 03/31/2005 Document Revised: 06/23/2011 Document Reviewed: 01/15/2011 [facility] Patient Information 2015 [place], [place]. This information is not intended to replace advice given to you by your health care provider. Make sure you discuss  any questions you have with your health care provider.

## 2014-07-01 NOTE — Discharge Summary (Signed)
[facility] Discharge Summary  [name] Medical record number: [id number] Date of birth: Feb 28, 1946 Age: 69 y.o. Gender: female Date of Admission: 06/29/2014  Date of Discharge: 07/01/14 Admitting Physician: Willeen Niece, MD  Primary Care Provider: [name], MD Consultants: none  Indication for Hospitalization: dehydration related to nausea, vomiting, and diarrhea  Discharge Diagnoses/Problem List:  Viral gastroenteritis UTI AKI on CKD stage III Hypokalemia Parkinsons MDD/anxiety HTN/HLD DM  Disposition: home  Discharge Condition: stable  Discharge Exam:  General: NAD Cardiovascular: RRR, No MGR  Respiratory: CTA Bilaterally, appropriate rate, unlabored.  Abdomen: S, NT, ND, +BS Extremities: [facility]   Brief Hospital Course:  #Nausea/Vomiting/Diarrhea: presented after 2 weeks of these symptoms. Most likley 2/2 a viral gatroenteritis. She denied any recent ABX use, no new foods and the movements are watery in nature. Patient was initially placed on IVF s/p a 1 L bolus in the ED. Received zofran for nausea. Had improvement in symptoms prior to day of discharge. Diet advanced to regular prior to discharge. C diff pcr ordered though never collected.  #UTI: UA positive for nitrites and leukocytes. Foul smelling urine with some suprapubic tenderness on exam. UCx with e coli. Switched to keflex from CTX. Sensitivities pending at discharge.   #AKI/CKD stage III: Scr baseline 0.9-1.1. Cr 1.35 on admission. Most likely related to the fluid losses. Cr improved to 1.14 prior to discharge.  #Hypokalemia: K of 2.5 on admission. This was repleted IV. Likely associated with the diarrhea. Normalized prior to discharge.  #Parkinson's Disease: Pt. 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  #Major Depression / Anxiety: appears stable  - Continued Zoloft 100 mg qd.   #HTN/HLD:  - continued olmesartan 20 mg daily   #DMII - Last A1C 6.6, on Metformin. Held on admission. Will restart at discharge.   Issues for Follow Up:  1. UCx sensitivities 2. Status of N/V/D 3. F/u renal function - Cr near baseline at 1.14 on discharge 4. Recheck potassium given low on admission   Significant Procedures: none  Significant Labs and Imaging:   Recent Labs Lab 06/29/14 1251 06/30/14 0430  WBC 11.6* 8.0  HGB 14.3 12.2  HCT 42.4 37.4  PLT 381 271    Recent Labs Lab 06/29/14 1240 06/29/14 1251 06/30/14 0430  NA  --  139 141  K  --  2.5* 3.6  CL  --  97 104  CO2  --  28 31  GLUCOSE  --  187* 115*  BUN  --  16 12  CREATININE  --  1.35* 1.14*  CALCIUM  --  9.1 8.0*  MG 1.6  --   --   ALKPHOS 90  --   --   AST 24  --   --   ALT 12  --   --   ALBUMIN 4.2  --   --    UCx E coli  Lactic acid 2.00 Lipase 47  Results/Tests Pending at Time of Discharge: UCx sensitivities  Discharge Medications:    Medication List    STOP taking these medications        potassium chloride 10 MEQ tablet  Commonly known as:  K-DUR      TAKE these medications        aspirin 81 MG tablet  Take 1 tablet (81 mg total) by mouth  daily.     atorvastatin 80 MG tablet  Commonly known as:  LIPITOR  Take 1 tablet (80 mg total) by mouth daily.     carbidopa-levodopa 25-100 MG per tablet  Commonly known as:  SINEMET  Take 2 tablets by mouth 3 (three) times daily.     cephALEXin 500 MG capsule  Commonly known as:  KEFLEX  Take 1 capsule (500 mg total) by mouth 2 (two) times daily.     eszopiclone 2 MG Tabs tablet  Commonly known as:  LUNESTA  Take 2 mg by mouth at bedtime as needed for sleep.     furosemide 40 MG tablet  Commonly known as:  LASIX  Take 40 mg by mouth daily.     gabapentin 300 MG capsule  Commonly known as:  NEURONTIN  Take 600 mg by mouth 2  (two) times daily.     metFORMIN 850 MG tablet  Commonly known as:  GLUCOPHAGE  Take 1 tablet (850 mg total) by mouth daily with breakfast.     olmesartan 20 MG tablet  Commonly known as:  BENICAR  Take 1 tablet (20 mg total) by mouth daily.     omeprazole 40 MG capsule  Commonly known as:  PRILOSEC  Take 40 mg by mouth daily.     sertraline 100 MG tablet  Commonly known as:  ZOLOFT  Take 100 mg by mouth daily.     traMADol 50 MG tablet  Commonly known as:  ULTRAM  1-2 tabs po bid prn pain        Discharge Instructions: Please refer to Patient Instructions section of EMR for full details.  Patient was counseled important signs and symptoms that should prompt return to medical care, changes in medications, dietary instructions, activity restrictions, and follow up appointments.   Follow-Up Appointments: Follow-up Information    Follow up with [name], MD.   Specialties:  Family Medicine, Sports Medicine   Why:  call on monday to schedule an appointment for hospital follow-up in the next week   Contact information:   [id number] [place] [address] [phone]       Leone Haven, MD 07/01/2014, 6:21 AM PGY-3, [place]

## 2014-07-04 ENCOUNTER — Other Ambulatory Visit: Payer: Self-pay | Admitting: Family Medicine

## 2014-07-05 ENCOUNTER — Ambulatory Visit (INDEPENDENT_AMBULATORY_CARE_PROVIDER_SITE_OTHER): Payer: Commercial Managed Care - HMO | Admitting: Family Medicine

## 2014-07-05 ENCOUNTER — Encounter: Payer: Self-pay | Admitting: Family Medicine

## 2014-07-05 VITALS — BP 179/96 | HR 109 | Temp 97.5°F | Wt 181.0 lb

## 2014-07-05 DIAGNOSIS — I10 Essential (primary) hypertension: Secondary | ICD-10-CM

## 2014-07-05 DIAGNOSIS — E119 Type 2 diabetes mellitus without complications: Secondary | ICD-10-CM

## 2014-07-05 DIAGNOSIS — E860 Dehydration: Secondary | ICD-10-CM | POA: Diagnosis not present

## 2014-07-05 LAB — POCT GLYCOSYLATED HEMOGLOBIN (HGB A1C): Hemoglobin A1C: 6.3

## 2014-07-06 ENCOUNTER — Other Ambulatory Visit: Payer: Self-pay | Admitting: Family Medicine

## 2014-07-06 LAB — BASIC METABOLIC PANEL
BUN: 16 mg/dL (ref 6–23)
CO2: 23 mEq/L (ref 19–32)
Calcium: 9 mg/dL (ref 8.4–10.5)
Chloride: 109 mEq/L (ref 96–112)
Creat: 0.89 mg/dL (ref 0.50–1.10)
Glucose, Bld: 124 mg/dL — ABNORMAL HIGH (ref 70–99)
Potassium: 3.3 mEq/L — ABNORMAL LOW (ref 3.5–5.3)
Sodium: 140 mEq/L (ref 135–145)

## 2014-07-06 MED ORDER — METFORMIN HCL 850 MG PO TABS: 850.0000 mg | ORAL_TABLET | Freq: Every day | ORAL | Status: DC

## 2014-07-06 NOTE — Assessment & Plan Note (Signed)
I reviewed her medicines. She's supposed to be on propranolol she's evidently run out of and I will refill. Her cardiologist put her on 40 mg of Lasix and tingly equivalents of potassium daily which she's not been taking. She's also recently been placed on olmesarton. She's not sure if she took it this morning or not.  I reviewed her medicines with her extensively. Also urged her today in her all the family members particularly her daughter to help her set up a way to ensure she gets her medications regularly without any confusion. I will check her potassium and kidney function today. I'd like to see her back in month and plan to recheck those both as well given the fact that she's now on Lasix, potassium, Benicar.

## 2014-07-06 NOTE — Progress Notes (Signed)
   Subjective:    Patient ID: [name], female    DOB: Sep 25, 1945, 69 y.o.   MRN: [id number]  HPI Follow-up recent hospitalization with UTI, dehydration, SIRS. She relates that she really had no symptoms of urinary frequency or burning just noticed that she was getting more more confused. Her family noted that she was confused and was not eating or drinking as well and had call the family practice Center and we sent her to the emergency department.  Since returning home she's felt much more like herself. She does have some questions about her medicines. #3. Hypertension. She has run out of her prescription for propranolol. She has not been taking her fluid pill or her potassium supplement because she wasn't clear on how she was supposed to take those.   Review of Systems No fever, sweats, chills, unusual weight change since her return home from the hospital. She's not had any headaches. She's had no more symptoms of confusion. No increase in urinary frequency no burning on urination, no unusual back pain.    Objective:   Physical Exam  Vital signs are reviewed GEN.: Well-developed female no acute distress HEENT: Oropharynx is without exudate. Mucous membranes are slightly dry. Neck is without lymphadenopathy. No thyromegaly and no JVD. LUNGS: Clear to auscultation CV: Regular rate and rhythm NEURO: Resting tremor lips, head, hands. Gait is somewhat stiff, wide-based antalgic. She rises from a chair without any assistance but she has some stiffness with that. PSYCHIATRIC: Alert nor it 4. Interactive. Asks and answers questions properly. Good eye contact. Neatly dressed. Denies hallucination.      Assessment & Plan:

## 2014-07-06 NOTE — Assessment & Plan Note (Signed)
A1c looks pretty good. Continue on current metformin dose but I want to recheck her creatinine today. Was elevated at last hospitalization but she was admitted for dehydration. We may need to follow this closely and potentially adjust her metformin dosage.

## 2014-07-06 NOTE — Progress Notes (Signed)
Updating her med list Cardiologist put her on potassium.

## 2014-07-06 NOTE — Assessment & Plan Note (Signed)
Recent hospitalization for dehydration. Sounds a started with a UTI and then she got confused. Unclear how she took her medicines. Seems to have resolved. She certainly high risk for medication confusion and I discussed that at length with her.

## 2014-07-11 ENCOUNTER — Encounter: Payer: Self-pay | Admitting: Family Medicine

## 2014-07-11 ENCOUNTER — Telehealth: Payer: Self-pay | Admitting: Cardiology

## 2014-07-11 NOTE — Telephone Encounter (Signed)
New message      Pt said she had some test in feb and never got the results.  She is not sure what kind of test she had.  Please call with results

## 2014-07-11 NOTE — Telephone Encounter (Signed)
Pt calling for lab results that were done on 07/05/14 at her PCP office Dr Nori Riis.  Informed the pt that I could inform her of the preliminary result, but it looks as if Dr Nori Riis has not reviewed these results yet, and given her final interpretation.  Advised the pt to contact her PCP office Dr Nori Riis to obtain the final result, and any recommendations.  Went over the preliminary with the pt and advised her to continue current med regimen, until further advised by Dr Nori Riis.  Pt verbalized understanding and agrees with this plan.

## 2014-07-12 ENCOUNTER — Ambulatory Visit: Payer: Commercial Managed Care - HMO | Admitting: Family Medicine

## 2014-07-25 ENCOUNTER — Telehealth: Payer: Self-pay | Admitting: *Deleted

## 2014-07-25 NOTE — Telephone Encounter (Signed)
Patient's daughter had called and is concerned abut mother. She states that patient has been saying she wants to die. She doesn't want to get out of bed or eat and is very weak. She has attempted to take patient to hospital, but patient is very combative and argumental. Would like Dr. Nori Riis to call her back to discuss this and options.  Daughter has been informed once again not to call Dr. Alinda Money number. [name] stated that she remembers me telling her that last time but forgot to take the number out of her phone. I advised her to take that number out now and replace it with the once I am calling from [phone] so she does not get it mixed up again so she can reach [place] quicker.

## 2014-07-27 ENCOUNTER — Telehealth: Payer: Self-pay | Admitting: Family Medicine

## 2014-07-27 NOTE — Telephone Encounter (Signed)
Pt called and needs to speak to Dr. Nori Riis. She didn't want to tell me. jw

## 2014-07-28 ENCOUNTER — Ambulatory Visit (INDEPENDENT_AMBULATORY_CARE_PROVIDER_SITE_OTHER): Payer: Commercial Managed Care - HMO | Admitting: Family Medicine

## 2014-07-28 VITALS — BP 130/80 | HR 90 | Temp 97.4°F | Ht 64.0 in | Wt 179.0 lb

## 2014-07-28 DIAGNOSIS — R1110 Vomiting, unspecified: Secondary | ICD-10-CM | POA: Diagnosis not present

## 2014-07-28 DIAGNOSIS — R312 Other microscopic hematuria: Secondary | ICD-10-CM

## 2014-07-28 DIAGNOSIS — R3129 Other microscopic hematuria: Secondary | ICD-10-CM

## 2014-07-28 LAB — CBC WITH DIFFERENTIAL/PLATELET
Basophils Absolute: 0 10*3/uL (ref 0.0–0.1)
Basophils Relative: 0 % (ref 0–1)
Eosinophils Absolute: 0.1 10*3/uL (ref 0.0–0.7)
Eosinophils Relative: 1 % (ref 0–5)
HCT: 42 % (ref 36.0–46.0)
Hemoglobin: 14.4 g/dL (ref 12.0–15.0)
Lymphocytes Relative: 28 % (ref 12–46)
Lymphs Abs: 2.7 10*3/uL (ref 0.7–4.0)
MCH: 30.6 pg (ref 26.0–34.0)
MCHC: 34.3 g/dL (ref 30.0–36.0)
MCV: 89.2 fL (ref 78.0–100.0)
MPV: 10.4 fL (ref 8.6–12.4)
Monocytes Absolute: 1.2 10*3/uL — ABNORMAL HIGH (ref 0.1–1.0)
Monocytes Relative: 12 % (ref 3–12)
Neutro Abs: 5.8 10*3/uL (ref 1.7–7.7)
Neutrophils Relative %: 59 % (ref 43–77)
Platelets: 429 10*3/uL — ABNORMAL HIGH (ref 150–400)
RBC: 4.71 MIL/uL (ref 3.87–5.11)
RDW: 13.5 % (ref 11.5–15.5)
WBC: 9.8 10*3/uL (ref 4.0–10.5)

## 2014-07-28 LAB — POCT URINALYSIS DIPSTICK
Glucose, UA: NEGATIVE
Ketones, UA: 15
Nitrite, UA: POSITIVE
Protein, UA: 100
Spec Grav, UA: >=1.03
Urobilinogen, UA: 1
pH, UA: 5.5

## 2014-07-28 LAB — POCT UA - MICROSCOPIC ONLY

## 2014-07-28 LAB — COMPREHENSIVE METABOLIC PANEL
ALT: 8 U/L (ref 0–35)
AST: 17 U/L (ref 0–37)
Albumin: 4.5 g/dL (ref 3.5–5.2)
Alkaline Phosphatase: 118 U/L — ABNORMAL HIGH (ref 39–117)
BUN: 15 mg/dL (ref 6–23)
CO2: 23 mEq/L (ref 19–32)
Calcium: 10.2 mg/dL (ref 8.4–10.5)
Chloride: 99 mEq/L (ref 96–112)
Creat: 1.3 mg/dL — ABNORMAL HIGH (ref 0.50–1.10)
Glucose, Bld: 147 mg/dL — ABNORMAL HIGH (ref 70–99)
Potassium: 4.5 mEq/L (ref 3.5–5.3)
Sodium: 143 mEq/L (ref 135–145)
Total Bilirubin: 1.1 mg/dL (ref 0.2–1.2)
Total Protein: 7.4 g/dL (ref 6.0–8.3)

## 2014-07-28 MED ORDER — ONDANSETRON HCL 4 MG PO TABS: 4.0000 mg | ORAL_TABLET | Freq: Three times a day (TID) | ORAL | Status: DC | PRN

## 2014-07-28 MED ORDER — CEPHALEXIN 500 MG PO CAPS: 500.0000 mg | ORAL_CAPSULE | Freq: Four times a day (QID) | ORAL | Status: DC

## 2014-07-28 NOTE — Progress Notes (Signed)
Patient ID: [name], female   DOB: 1945-06-25, 69 y.o.   MRN: [id number]  HPI:  Pt presents for a same day appointment to discuss nausea and vomiting.  Patient was recently admitted to the hospital in March with a UTI and altered mental status. Patient reports that she's been vomiting and having persistent diarrhea since her hospitalization. She vomited all day yesterday, and woke up at 3 AM this morning continuing to have vomiting. Has not had any diarrhea yesterday or today. She did keep some water down prior to this appointment, as well as some ginger ale. She has no abdominal pain. Her daughter accompanies her today and reports that last week and she appeared confused, but now seems better with her mental status. She is not taking any nausea medication. She's not had a fever. She has noted that her urine is malodorous and cloudy. No dysuria. No blood in the vomit. It is nonbilious. Denies swelling in her legs.   She does endorse some chest discomfort on review of systems. States this occurred after falling off of the bed on Sunday and hitting her chest. She has a history of Parkinson's which increases her risk for falls. Denies any exertional chest pain. The area was not sore before she fell and hit it. She reportedly had a bruise after the fall.   She has a history of diabetes and has been checking her blood sugar at home. They have been in the 120s.   Is not sure when her last colonoscopy was. She does have a history of esophageal stricture requiring intervention in the past.  ROS: See HPI  [place]:  Hypertension, esophageal dysmotility and stricture in past, Parkinson's disease, depression, substance abuse, chronic pain  PHYSICAL EXAM: BP 130/80 mmHg  Pulse 90  Temp(Src) 97.4 F (36.3 C) (Oral)  Ht 5\' 4"  (1.626 m)  Wt 179 lb (81.194 kg)  BMI 30.71 kg/m2 Gen:  No acute distress, nontoxic appearing HEENT:  Normocephalic, atraumatic, tongue is tacky but sides of mouth are  somewhat moist. No oral lesions. Heart:  Regular rate and rhythm Lungs:  Clear to auscultation bilaterally, normal respiratory effort Abdomen:  Soft, nontender to palpation, no suprapubic tenderness, no masses or organomegaly, normoactive bowel sounds Neuro:  Grossly nonfocal, speech normal, alert and oriented to person, place, situation, and time Extremities: No edema. Normal skin turgor. Brisk capillary refill  ASSESSMENT/PLAN:  1.  Nausea, vomiting, diarrhea: No abundantly clear etiology from history or exam other than a UTI. Her urine does appear infected today, and she needs to be treated for urinary tract infection. As she is unsure when her last colonoscopy was and has had persistent vomiting, referral to GI is indicated. Her abdominal exam is benign today. She does appear mildly dehydrated. I discussed the possibility of hospitalizing her, but patient and her daughter would strongly prefer to go home and rehydrate orally. They're able to go to the emergency room should she worsen over the weekend.  Plan: -Keflex 500 mg 4 times daily for 7 days -Urine culture -Rx zofran for nausea -Refer to GI for likely EGD and colonoscopy -Check CBC with differential, CMET today -Discussed oral rehydration solution at home -Patient to bring stool sample with her to next appointment to run C. difficile testing -Follow-up with me or PCP in 3 days -Extensively discussed reasons to prompt emergency evaluation this weekend, including inability to tolerate liquids, inability to take her antibiotic, worsening mental status, abdominal pain, etc.  FOLLOW UP: F/u in 3  days for n/v/d.  [place]. Ardelia Mems, [place]

## 2014-07-28 NOTE — Patient Instructions (Signed)
I am referring you to a GI doctor for your vomiting and diarrhea. You will get a phone call to schedule this appointment.  Takes Zofran every 8 hours as needed for nausea Take Keflex 500 mg 4 times a day for the next week If you're unable to keep liquids down you need to go to the emergency room, or if you are unable to take the antibiotics Get oral rehydration solution for adults to drink Checking bloodwork today Come back Monday, bring stool sample with you then Go to ER if getting worse or can't keep liquids or medicine down!  Be well, Dr. Ardelia Mems

## 2014-07-31 ENCOUNTER — Encounter: Payer: Self-pay | Admitting: Family Medicine

## 2014-07-31 ENCOUNTER — Ambulatory Visit: Payer: Commercial Managed Care - HMO | Admitting: Family Medicine

## 2014-07-31 ENCOUNTER — Ambulatory Visit (INDEPENDENT_AMBULATORY_CARE_PROVIDER_SITE_OTHER): Payer: Commercial Managed Care - HMO | Admitting: Family Medicine

## 2014-07-31 VITALS — BP 111/58 | HR 111 | Temp 97.9°F | Ht 64.0 in | Wt 177.5 lb

## 2014-07-31 DIAGNOSIS — N390 Urinary tract infection, site not specified: Secondary | ICD-10-CM | POA: Diagnosis not present

## 2014-07-31 DIAGNOSIS — R000 Tachycardia, unspecified: Secondary | ICD-10-CM | POA: Diagnosis not present

## 2014-07-31 LAB — URINE CULTURE: Colony Count: >=100000

## 2014-07-31 MED ORDER — CEPHALEXIN 500 MG PO CAPS: 500.0000 mg | ORAL_CAPSULE | Freq: Four times a day (QID) | ORAL | Status: DC

## 2014-07-31 NOTE — Progress Notes (Signed)
Patient ID: [name], female   DOB: 12-06-1945, 69 y.o.   MRN: [id number]  HPI:  Patient presents for scheduled follow up. I saw her Friday 4/15 for vomiting and diagnosed her with UTI. Treated with course of keflex.  Patient reports she is feeling much better. Had mild transient R lower back pain that has resolved. Has not vomited since Friday. Eating and drinking normally, still with some decreased appetite. She has been taking keflex. Reports hx of 3 UTI's in the last 6 months. Denies having any confusion.   Pulse noted to be elevated. Pt reports compliance with her propranolol but does not seem entirely sure she took it. On ROS endorses very mild SOB which she attributes to having laid in bed and now being more active. Denies swelling or pain in her legs.   ROS: See HPI.  [place]: hx T2DM, HLD, depression, HTN, arthritis, anemia, parkinsons  PHYSICAL EXAM: BP 111/58 mmHg  Pulse 111  Temp(Src) 97.9 F (36.6 C) (Oral)  Ht 5\' 4"  (1.626 m)  Wt 177 lb 8 oz (80.513 kg)  BMI 30.45 kg/m2 Gen: NAD, pleasant, cooperative HEENT: NCAT, MMM Heart: RRR no murmur Lungs: CTAB NWOB Neuro: grossly nonfocal speech normal Back: no CVA tenderness bilat Abd: soft NTTP no masses or organomegaly, no peritoneal signs Ext: No appreciable lower extremity edema bilaterally. No calf erythema, swelling, or tenderness bilaterally. Negative homan's bilat.  Skin: normal skin turgor  ASSESSMENT/PLAN:  1. UTI - vomiting now improving with treatment of UTI. Urine cx has grown e coli which is sensitive to keflex. Given her hx of multiple recent UTI's would benefit from urology referral for anatomic evaluation. I will also extend her course of keflex to 14 days total in attempt to prevent recurrence. Recheck BMET today since she had very mild AKI during Friday's visit. Continue hydration, keflex. F/u with PCP in 1 week for repeat evaluation.  2. Tachycardia - suspect may be due to missed dose of propranolol  as would expect her HR to be suppressed if was on this med. Has been mildly tachycardic in the past intermittently. Pt did not seem 100% sure she took the medicine. Denies any CP or lower ext edema. Wells score 3, doubt PE as cause. No hypoxia (93% with ambulation on room air). Endorses very mild SOB on ROS, but not really a complaint of patient. Discussed going to ER if SOB worsens or persists. Pt and daughter agreeable to this plan.   FOLLOW UP: F/u in 1 week for above issues  [place] J. Ardelia Mems, [place]

## 2014-07-31 NOTE — Patient Instructions (Signed)
I'm glad you are feeling better!  Take the keflex for an additional 7 days (2 weeks total) I am referring you to a urologist for your urinary tract infections. You will get a phone call to schedule this appointment.   Someone should also be calling you from the GI office. Rechecking kidney function today.  Follow up with Dr. Nori Riis in one week.  Be well, Dr. Ardelia Mems

## 2014-08-01 LAB — BASIC METABOLIC PANEL
BUN: 15 mg/dL (ref 6–23)
CO2: 28 mEq/L (ref 19–32)
Calcium: 9.7 mg/dL (ref 8.4–10.5)
Chloride: 99 mEq/L (ref 96–112)
Creat: 1.55 mg/dL — ABNORMAL HIGH (ref 0.50–1.10)
Glucose, Bld: 107 mg/dL — ABNORMAL HIGH (ref 70–99)
Potassium: 3.7 mEq/L (ref 3.5–5.3)
Sodium: 141 mEq/L (ref 135–145)

## 2014-08-01 NOTE — Telephone Encounter (Signed)
Moore,Michelle Daughter [phone]   I spoke w [name] today re her mom. Last week her Mom got really ill mannered and hateful. Was seen here in clinic and found to have UTI. [name] is alops pretty convinced that her Mom is obtaining illegal street drugs--mostly benzos. 68 thi man they befriended a year ago (D) who now lives att heir home is obtaining them for her.  We discussed--coming up with very few options. She would like to get her Mom involved in some type of day program or support group for Parkinsons--at least to get her out of house. I will look into that. Mrs Lasseigne has appt w me in a week. [name] said she is doing better this week. [name]

## 2014-08-02 ENCOUNTER — Telehealth: Payer: Self-pay | Admitting: Family Medicine

## 2014-08-02 NOTE — Telephone Encounter (Signed)
Left Msg with female at home for patient to return call, when she does please give her the following info:  Urology appt made for:  Sep 04, 2014 @ 9:00 am with Dr. Nicki Reaper [facility] [address] [place], [place] [address] [phone]

## 2014-08-04 ENCOUNTER — Telehealth: Payer: Self-pay | Admitting: Family Medicine

## 2014-08-04 NOTE — Telephone Encounter (Signed)
Reached patient and gave above message. She states she is feeling much better.  Leeanne Rio, MD

## 2014-08-04 NOTE — Telephone Encounter (Signed)
Attempted to reach patient about her BMET from earlier this week, also to check on how she's doing. Still has mild elevation of creatinine. Recommend repeat labwork next week when she sees Dr. Nori Riis. No answer at first number, and second number female answered and asked me to call back in 20 mins. Will attempt to reach her again later.  Leeanne Rio, MD

## 2014-08-09 ENCOUNTER — Ambulatory Visit (INDEPENDENT_AMBULATORY_CARE_PROVIDER_SITE_OTHER): Payer: Commercial Managed Care - HMO | Admitting: Family Medicine

## 2014-08-09 ENCOUNTER — Encounter: Payer: Self-pay | Admitting: Family Medicine

## 2014-08-09 VITALS — BP 122/72 | Temp 98.3°F | Ht 64.0 in | Wt 181.1 lb

## 2014-08-09 DIAGNOSIS — M159 Polyosteoarthritis, unspecified: Secondary | ICD-10-CM

## 2014-08-09 DIAGNOSIS — M8949 Other hypertrophic osteoarthropathy, multiple sites: Secondary | ICD-10-CM

## 2014-08-09 DIAGNOSIS — I10 Essential (primary) hypertension: Secondary | ICD-10-CM

## 2014-08-09 DIAGNOSIS — E119 Type 2 diabetes mellitus without complications: Secondary | ICD-10-CM

## 2014-08-09 DIAGNOSIS — M150 Primary generalized (osteo)arthritis: Secondary | ICD-10-CM | POA: Diagnosis not present

## 2014-08-09 DIAGNOSIS — G20 Parkinson's disease: Secondary | ICD-10-CM

## 2014-08-09 LAB — BASIC METABOLIC PANEL
BUN: 13 mg/dL (ref 6–23)
CO2: 26 mEq/L (ref 19–32)
Calcium: 9.1 mg/dL (ref 8.4–10.5)
Chloride: 105 mEq/L (ref 96–112)
Creat: 0.76 mg/dL (ref 0.50–1.10)
Glucose, Bld: 102 mg/dL — ABNORMAL HIGH (ref 70–99)
Potassium: 3.8 mEq/L (ref 3.5–5.3)
Sodium: 140 mEq/L (ref 135–145)

## 2014-08-09 NOTE — Patient Instructions (Addendum)
[facility] [employer]--- Call our Helpline: 1-800-4PD-INFO [phone]) Staffed by nurses, social workers and therapists, our Helpline is here to support you in any possible way.   I will send you a note about your lab work. Great to see you!

## 2014-08-10 NOTE — Progress Notes (Signed)
   Subjective:    Patient ID: [name], female    DOB: 23-Nov-1945, 69 y.o.   MRN: [id number]  HPI #1. Follow-up recent urinary tract infection. She finished the antibiotics and is having no more symptoms. Notably she had become a little bit confused with her urinary tract infection this seems to have totally resolved. She is here today with her husband. #2. Was seen by cardiologist which was a follow-up visit from her hospitalization for chest pain. She is a little confused about what they told her regarding the Lasix. She doesn't think she needs it and has stopped it in fact. She's not having any lower extremity swelling. #3. Knee pain. About a month she's going to go on vacation would like to get a knee injection about 2 weeks before that. Wants to know if that is okay. #4. Parkinson's: She's having some increased tremor and oropharyngeal problems. Says her tongue gets in the way when she wants to talk. She's also having some lip smacking there is aggravating her. She's not had any recent falls.   Review of Systems No fever, sweats, chills, unusual weight change. See history of present illness.    Objective:   Physical Exam Vital signs are reviewed GEN.: Well-developed female no acute distress NEURO: Resting tremor bilateral hands, oro-buccal dyskinesias. Speech is somewhat thickened at times secondary to difficulty with her tongue. I don't note any cogwheel rigidity today she is able to get up of a chair by herself. Her gait is a little bit wide-based that she seems steady. Her movement seen quicker today than on some previous visits. CV: Regular rate and rhythm without murmur LUNGS: Clear to auscultation bilaterally ABDOMEN: Soft, positive bowel sounds nontender nondistended PSYCHIATRIC: Alert and oriented 4. Affect is interactive. No evidence of vegetation or psychomotor retardation. Neatly dressed. Asks and answers questions appropriately. Speech is sometimes not is fluent  secondary to her oral dyskinesias but her content is normal.       Assessment & Plan:  #1. Follow-up recent episode of confusion and urinary tract infection. Seems resolved at this point. #2. Regarding her Lasix. I'm not sure exactly why she was placed on it but I looked at her last cardiology note they said it was fine to discontinue it. Her cardiac workup was essentially negative.

## 2014-08-10 NOTE — Assessment & Plan Note (Signed)
If she wants to get a corticosteroid injection in her left knee in the next couple weeks I think that will be fine. I gave her an appointment today for follow-up.

## 2014-08-10 NOTE — Assessment & Plan Note (Signed)
He is having some increasing oral dyskinesias. Nurse her to continue to follow-up regularly with her neurologist.

## 2014-08-11 ENCOUNTER — Encounter: Payer: Self-pay | Admitting: Family Medicine

## 2014-08-17 NOTE — Progress Notes (Signed)
Addendum for billing purposes: -urine culture ordered for diagnosis microscopic hematuria

## 2014-08-30 ENCOUNTER — Encounter: Payer: Self-pay | Admitting: Family Medicine

## 2014-08-30 ENCOUNTER — Other Ambulatory Visit: Payer: Self-pay | Admitting: Family Medicine

## 2014-08-30 ENCOUNTER — Ambulatory Visit (INDEPENDENT_AMBULATORY_CARE_PROVIDER_SITE_OTHER): Payer: Commercial Managed Care - HMO | Admitting: Family Medicine

## 2014-08-30 VITALS — BP 116/97 | HR 60 | Temp 98.1°F | Ht 64.0 in | Wt 184.0 lb

## 2014-08-30 DIAGNOSIS — M25562 Pain in left knee: Secondary | ICD-10-CM

## 2014-08-30 DIAGNOSIS — B078 Other viral warts: Secondary | ICD-10-CM | POA: Diagnosis not present

## 2014-08-30 DIAGNOSIS — C44509 Unspecified malignant neoplasm of skin of other part of trunk: Secondary | ICD-10-CM

## 2014-08-30 DIAGNOSIS — D492 Neoplasm of unspecified behavior of bone, soft tissue, and skin: Secondary | ICD-10-CM

## 2014-08-30 MED ORDER — ONDANSETRON HCL 4 MG PO TABS: 4.0000 mg | ORAL_TABLET | Freq: Three times a day (TID) | ORAL | Status: DC | PRN

## 2014-08-30 NOTE — Progress Notes (Signed)
   Subjective:    Patient ID: [name], female    DOB: 10/19/1945, 69 y.o.   MRN: [id number]  HPI #1. Follow-up left knee pain. They're getting ready on vacation she wanted to get a corticosteroid injection prior to that because she's going to be doing more activity. Pain in the knees about 8 out of 10 most of the time currently. Hasn't noted any swelling or erythema of the knee. #2. Also wants to discuss again the spot on the skin of her chest. Really bothering her. She says her clothes snag on it she thinks it's getting bigger every day. As noted no bleeding or oozing from that area.   Review of Systems No unusual weight change, fever, sweats, chills. See history of present illness for additional pertinent review of systems.    Objective:   Physical Exam  Vital signs are reviewed Gen.: Well-developed female no acute distress SKIN: 5 mm area on the anterior chest that is raised, slightly pearly, no excoriation. Nontender. There are area around it has no erythema. EXTREMITY: Left knee crepitus on extension. Medial joint line tenderness. Mild synovial hypertrophy. No effusion. Popliteal space benign. Calf is soft.  INJECTION: Patient was given informed consent, signed copy in the chart. Appropriate time out was taken. Area prepped and draped in usual sterile fashion. 1 cc of methylprednisolone 40 mg/ml plus  4 cc of 1% lidocaine without epinephrine was injected into the left knee using a(n) anterior medial approach. The patient tolerated the procedure well. There were no complications. Post procedure instructions were given.  PROCEDURE NOTE: Patient given informed consent for punch biopsy of lesion on her chest. Signed copies in the chart. Appropriate time out taken. Area cleaned with alcohol and Betadine and local anesthesia was obtained using 1/2 mL of function lidocaine with epinephrine. A 4 mm Keyes punch was then used to remove the majority of the lesion. This was sent for  pathology. Drysol was used for hemostasis with some pressure and then a Band-Aid was applied. Pathology is pending.        Assessment & Plan:

## 2014-08-31 ENCOUNTER — Other Ambulatory Visit: Payer: Self-pay | Admitting: Family Medicine

## 2014-09-01 ENCOUNTER — Encounter: Payer: Self-pay | Admitting: Family Medicine

## 2014-09-05 ENCOUNTER — Ambulatory Visit: Payer: Commercial Managed Care - HMO | Admitting: Cardiology

## 2014-09-14 ENCOUNTER — Other Ambulatory Visit: Payer: Self-pay | Admitting: Family Medicine

## 2014-10-03 ENCOUNTER — Telehealth: Payer: Self-pay | Admitting: Family Medicine

## 2014-10-03 NOTE — Telephone Encounter (Signed)
[facility] Auth # [id number] 10/09/14 thru 04/07/15 Approved.

## 2014-10-03 NOTE — Telephone Encounter (Signed)
Debbie from [facility] is calling because this patient has an appointment on Monday June 27th to see Dr. Leta Baptist for a 6 month follow up; A referral placement is needed for this appointment; the doctor's NPI is: [id number] and the  Diagnosis code is: G20. Thank you, Fonda Kinder, ASA

## 2014-10-09 ENCOUNTER — Ambulatory Visit (INDEPENDENT_AMBULATORY_CARE_PROVIDER_SITE_OTHER): Payer: Commercial Managed Care - HMO | Admitting: Diagnostic Neuroimaging

## 2014-10-09 ENCOUNTER — Encounter: Payer: Self-pay | Admitting: Diagnostic Neuroimaging

## 2014-10-09 VITALS — BP 125/72 | HR 66 | Ht 64.0 in | Wt 182.4 lb

## 2014-10-09 DIAGNOSIS — G20 Parkinson's disease: Secondary | ICD-10-CM

## 2014-10-09 MED ORDER — CARBIDOPA-LEVODOPA 25-100 MG PO TABS: 2.0000 | ORAL_TABLET | Freq: Three times a day (TID) | ORAL | Status: DC

## 2014-10-09 MED ORDER — CARBIDOPA-LEVODOPA 25-100 MG PO TABS: 2.5000 | ORAL_TABLET | Freq: Three times a day (TID) | ORAL | Status: DC

## 2014-10-09 NOTE — Progress Notes (Signed)
[facility]  PATIENT: [name] DOB: [birth year]  REFERRING CLINICIAN:  HISTORY FROM: patient and daughter REASON FOR VISIT: follow up   HISTORICAL  CHIEF COMPLAINT:  Chief Complaint  Patient presents with  . Parkinson's    rm 23, daughter, grand daughter  . Follow-up    HISTORY OF PRESENT ILLNESS:   UPDATE 10/09/14: Since last visit, had increased carb/levo up to 2.5 tabs TID (even though I had recommended going up to 2tabs TID; she's not sure why she did this). More tongue and mouth movements. More falls. Went to hospital for UTI, AKI, N/V/diarrhea In March 2016.   UPDATE 04/04/14: Since last visit, she reports that she is stable from PD standpoint. Tremor and gait are stable. Tolerating carb/levo 1.5 tabs TID. Hasn't tried going up to 2 tabs TID. Re: swallowing issues, she reports remote esophagus surgery for swallow issues 15 yrs ago. Feels that this is a problem again, and wants to see GI. Re: rash, it turns out that they had bedbugs in their home; now problem is resolved. Struggling with diarrhea and excess sweating.  UPDATE 12/26/13: Since last visit, was hospitalized (2 weeks ago) for delirium, hallucinations, chest pain, UTI, and medication overuse. Was taking ambien and a friend's xanax to help her sleep (was struggling with anxiety and insomnia; not seen Dr. Casimiro Needle in a while). Now back at home and doing better. Tremor and gait are worsening. Some wearing off (early AM and late evening). Also with 2 weeks are night time itching and rash. Going to see dermatology soon.   UPDATE 01/18/13: Since last visit, now on carb/levo 1.5 tabs TID. Nausea is improved since she started taking her metformin at different time than carb/levo. Tremor, balance, coordination are stable.  UPDATE 06/01/12: Since last visit, tried carb/levo 1.5 tabs TID x 1 week, then stopped. Felt like it was too much medicine. Did not have increased side effects. Has been struggling  with nausea throughout. Tremor is persistent.  UPDATE 01/28/12: Doing well. No wearing off. Tolerating meds. Stopped azilect (per PCP for nausea). Now on fluoxetine for depression.  UPDATE 08/27/10: Doing better on carb/levo.  Taking 1 tab TID (6am, 4pm, 9pm).  Minimal nausea.  Wakes up with more tremor in AM, then reduction in tremor 1min after dose.  Effect wears off around 4pm.  Daughter reports some intermittent confusion.  PRIOR HPI (06/26/10): 69 yo Caucasian female referred to [place] for tremor with concern for possible Parkinson's disease. [name] notes she first noticed a resting tremor in her left hand about 3 years ago, and this has gotten progressively worse since then. It has also spread to now involve her mouth and right hand as well, though she notes it is still worst in her left hand. Stress and anxiety can accentuate the tremors, whereas active use can reduce them. She also notes the tremors being worse in the morning. She denies noting anything else that seems to make the tremors better or worse. She admits to what seems like possible bradykinesia, in her words that she has "a hard time getting going sometimes," but denies any freezing. She does note some new difficulty with writing, but denies micrographia. She also admits to feeling like her balance and walking is "a bit off," but she relates this more to the osteoarthritis in her knees and having had surgery on her R knee. She denies orthostatic symptoms, hallucinations or delusions, difficulty standing or sitting, weakness, new visual changes (aside from her  macular degeneration), or feelings of rigidity. She also denies bizarre dreams, nightmares, RLS symptoms, or REM behavior disorder symptoms. She admits to having two uncles who have tremors, etiology unclear, but adds that one uncle had alcoholism (and it was believed this was the cause). She is concerned about what is causing her tremors, and admits that although she initially "put  off" being evaluated further, she is anxious to know what might be the cause of her tremors.  REVIEW OF SYSTEMS: Full 14 system review of systems performed and notable only for excess sweating runny nose trouble swallowing diarrhea incontinence freq waking back pain depression anxiety.   ALLERGIES: Allergies  Allergen Reactions  . Ace Inhibitors Anaphylaxis and Swelling  . Azilect [Rasagiline Mesylate] Other (See Comments)    hypotension  . Lisinopril Swelling    Severe facial angioedema requiring hospitalization 2007 (approx)    HOME MEDICATIONS: Outpatient Prescriptions Prior to Visit  Medication Sig Dispense Refill  . aspirin 81 MG tablet Take 1 tablet (81 mg total) by mouth daily. 30 tablet   . atorvastatin (LIPITOR) 80 MG tablet Take 1 tablet (80 mg total) by mouth daily. 90 tablet 3  . cephALEXin (KEFLEX) 500 MG capsule Take 1 capsule (500 mg total) by mouth 4 (four) times daily. For an extra 7 days 28 capsule 0  . furosemide (LASIX) 40 MG tablet Take 40 mg by mouth daily.    Marland Kitchen gabapentin (NEURONTIN) 300 MG capsule Take 600 mg by mouth 2 (two) times daily.     . metFORMIN (GLUCOPHAGE) 850 MG tablet Take 1 tablet (850 mg total) by mouth daily with breakfast. 90 tablet 3  . olmesartan (BENICAR) 20 MG tablet Take 1 tablet (20 mg total) by mouth daily. 30 tablet 12  . omeprazole (PRILOSEC) 40 MG capsule Take 40 mg by mouth daily.   0  . omeprazole (PRILOSEC) 40 MG capsule TAKE ONE CAPSULE BY MOUTH EVERY DAY 90 capsule 0  . ondansetron (ZOFRAN) 4 MG tablet Take 1 tablet (4 mg total) by mouth every 8 (eight) hours as needed for nausea or vomiting. 20 tablet 2  . ONE TOUCH ULTRA TEST test strip USE AS DIRECTED 100 each 12  . ONETOUCH DELICA LANCETS 50N MISC USE AS DIRECTED FOR GLUCOSE TESTING 100 each 0  . potassium chloride (K-DUR) 10 MEQ tablet Take 1 tablet (10 mEq total) by mouth daily.    . propranolol ER (INDERAL LA) 80 MG 24 hr capsule TAKE ONE CAPSULE BY MOUTH DAILY 90 capsule 3   . sertraline (ZOLOFT) 100 MG tablet Take 100 mg by mouth daily.     . traMADol (ULTRAM) 50 MG tablet 1-2 tabs po bid prn pain 120 tablet 1  . carbidopa-levodopa (SINEMET) 25-100 MG per tablet Take 2 tablets by mouth 3 (three) times daily. (Patient taking differently: Take 2.5 tablets by mouth 3 (three) times daily. ) 180 tablet 12   No facility-administered medications prior to visit.    PAST MEDICAL HISTORY: Past Medical History  Diagnosis Date  . Allergy   . Anxiety   . Hyperlipidemia   . Movement disorder   . Hypertension   . Scoliosis   . Esophageal stricture   . Cognitive impairment 12/11/2013  . Parkinson's disease   . Complication of anesthesia 07/2008    "hard to get me woke up when I had my knee replaced; they said they had to bring me back"  . Chronic bronchitis     "get it q yr"  .  Type II diabetes mellitus   . GERD (gastroesophageal reflux disease)   . Arthritis     "knees, back" (06/29/2014)  . Osteoarthritis   . Chronic mid back pain   . History of gout 1970's  . Situational depression   . UTI (lower urinary tract infection)     PAST SURGICAL HISTORY: Past Surgical History  Procedure Laterality Date  . 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 Esophagogastric fundoplasty      some type "esoph surgery" per pt  . Colon surgery    . Abdominal hysterectomy    . Cholecystectomy open    . Joint replacement    . Dilation and curettage of uterus    . Tubal ligation      FAMILY HISTORY: Family History  Problem Relation Age of Onset  . Cerebral palsy Son   . Heart disease Mother   . Diabetes Mother   . Heart disease Father     SOCIAL HISTORY:  History   Social History  . Marital Status: Married    Spouse Name: [name]  . Number of Children: 2  . Years of Education: 14   Occupational History  . Retired    Social History Main Topics  . Smoking status: Never Smoker   . Smokeless tobacco: Never Used  . Alcohol Use: No  . Drug Use: No  . Sexual  Activity: Not on file   Other Topics Concern  . Not on file   Social History Narrative   Health Care POA:    Emergency Contact: daughter, [name], [phone] husband, [name] [phone]   End of Life Plan:   Who lives with you: Lives with husband and son, [name]   Any pets: Rabbit, [name]   Diet: Patient has a varied diet but struggles with what to eat with hypertension, diabetes, parkinsons   Exercise: Patient does not have any regular exercise routine.   Seatbelts: Patient reports wearing her seatbelt when in vehicle.   Nancy Fetter Exposure/Protection: Patient reports wearing sun block lotion daily.   Hobbies: Watching game shows, writing poetry, writing in journal, volunteering at day program with son.    Caffeine Use: very little occasional             PHYSICAL EXAM  Filed Vitals:   10/09/14 1000  BP: 125/72  Pulse: 66  Height: 5\' 4"  (1.626 m)  Weight: 182 lb 6.4 oz (82.736 kg)    Not recorded      Body mass index is 31.29 kg/(m^2).  GENERAL EXAM: General: Patient is awake, alert and in no acute distress.  Well developed and groomed. Neck: Neck is supple. Cardiovascular: No carotid artery bruits.  Heart is regular rate and rhythm with no murmurs.  Neurologic Exam  Mental Status: Awake, alert. Language is fluent and comprehension intact. Cranial Nerves: Pupils are equal and reactive to light.  Visual fields are full to confrontation.  Conjugate eye movements are full and symmetric.  Facial sensation and strength are symmetric.  Hearing is intact.  Palate elevated symmetrically and uvula is midline.  Shoulder shrug is symmetric.  Tongue is midline. MODERATE OROLINGUAL DYSKINESIAS. Motor: INTERMITTENT RESTING TREMOR OF BUE AND MOUTH. MINIMAL POSTURAL TREMOR. BRADYKINESIA AND RIGIDITY IN LUE>RUE, LLE>RLE. Normal bulk. Full strength in the upper and lower extremities.  No pronator drift. Sensory: Intact and symmetric to light touch. Coordination: No ataxia or dysmetria on  finger-nose or rapid alternating movement testing. Reflexes: BUE and BLE present and symmetric Gait and Station: Narrow based gait. DECR  ARM SWING. DYSTONIA POSTURING OF HAND WHILE WALKING   DIAGNOSTIC DATA (LABS, IMAGING, TESTING) - I reviewed patient records, labs, notes, testing and imaging myself where available.  Lab Results  Component Value Date   WBC 9.8 07/28/2014   HGB 14.4 07/28/2014   HCT 42.0 07/28/2014   MCV 89.2 07/28/2014   PLT 429* 07/28/2014      Component Value Date/Time   NA 140 08/09/2014 1051   K 3.8 08/09/2014 1051   CL 105 08/09/2014 1051   CO2 26 08/09/2014 1051   GLUCOSE 102* 08/09/2014 1051   BUN 13 08/09/2014 1051   CREATININE 0.76 08/09/2014 1051   CREATININE 1.14* 06/30/2014 0430   CALCIUM 9.1 08/09/2014 1051   PROT 7.4 07/28/2014 1029   ALBUMIN 4.5 07/28/2014 1029   AST 17 07/28/2014 1029   ALT 8 07/28/2014 1029   ALKPHOS 118* 07/28/2014 1029   BILITOT 1.1 07/28/2014 1029   GFRNONAA 48* 06/30/2014 0430   GFRAA 56* 06/30/2014 0430   Lab Results  Component Value Date   CHOL 244* 06/07/2014   HDL 38.60* 06/07/2014   LDLCALC 123* 12/11/2013   LDLDIRECT 140.0 06/07/2014   TRIG 384.0* 06/07/2014   CHOLHDL 6 06/07/2014   Lab Results  Component Value Date   HGBA1C 6.3 07/05/2014   Lab Results  Component Value Date   VITAMINB12 301 01/05/2008   Lab Results  Component Value Date   TSH 0.517 03/31/2014    07/05/10 MRI BRAIN - minimal scattered chronic small vessel ischemic disease   ASSESSMENT AND PLAN  69 y.o. female with resting tremor of BUE, cogwheel rigidity, bradykinesia, decreased arm swing, anxiety, diff swallowing, memory loss. More oro-lingual dyskinesias (inadvertantly taking 2.5 tabs TID of carb/levo)  Dx: parkinson's disease  PLAN: I spent 15 minutes of face to face time with patient. Greater than 50% of time was spent in counseling and coordination of care with patient. In summary we discussed:  1. Reduce carb/levo  25/100 down to 2 tabs TID as needed 2. Recommend more supervision at home with medications and ADLs  Return in about 6 months (around 04/10/2015).    Penni Bombard, MD 5/59/7416, 38:45 AM Certified in Neurology, Neurophysiology and Neuroimaging  [facility] [address], [place], [place] [address] [phone]

## 2014-10-09 NOTE — Patient Instructions (Signed)
Reduce carbidopa-levodopa to 2tabs three time per day.

## 2014-10-31 DIAGNOSIS — N3946 Mixed incontinence: Secondary | ICD-10-CM | POA: Diagnosis not present

## 2014-10-31 DIAGNOSIS — N3020 Other chronic cystitis without hematuria: Secondary | ICD-10-CM | POA: Diagnosis not present

## 2014-10-31 DIAGNOSIS — R351 Nocturia: Secondary | ICD-10-CM | POA: Diagnosis not present

## 2014-11-03 DIAGNOSIS — F3341 Major depressive disorder, recurrent, in partial remission: Secondary | ICD-10-CM | POA: Diagnosis not present

## 2014-11-22 DIAGNOSIS — N3020 Other chronic cystitis without hematuria: Secondary | ICD-10-CM | POA: Diagnosis not present

## 2014-11-24 DIAGNOSIS — R351 Nocturia: Secondary | ICD-10-CM | POA: Diagnosis not present

## 2014-11-28 ENCOUNTER — Other Ambulatory Visit: Payer: Self-pay | Admitting: Family Medicine

## 2014-11-28 NOTE — Telephone Encounter (Signed)
Refill completed.  Tasnia Spegal L, RN  

## 2014-12-13 ENCOUNTER — Encounter: Payer: Commercial Managed Care - HMO | Admitting: Family Medicine

## 2014-12-13 ENCOUNTER — Ambulatory Visit: Payer: Commercial Managed Care - HMO | Admitting: Family Medicine

## 2015-01-08 ENCOUNTER — Ambulatory Visit (INDEPENDENT_AMBULATORY_CARE_PROVIDER_SITE_OTHER): Payer: Commercial Managed Care - HMO | Admitting: Family Medicine

## 2015-01-08 VITALS — BP 164/109 | HR 71 | Temp 98.3°F | Wt 180.0 lb

## 2015-01-08 DIAGNOSIS — M25562 Pain in left knee: Secondary | ICD-10-CM | POA: Diagnosis not present

## 2015-01-08 DIAGNOSIS — Z23 Encounter for immunization: Secondary | ICD-10-CM | POA: Diagnosis not present

## 2015-01-08 DIAGNOSIS — N6002 Solitary cyst of left breast: Secondary | ICD-10-CM

## 2015-01-08 DIAGNOSIS — E119 Type 2 diabetes mellitus without complications: Secondary | ICD-10-CM | POA: Diagnosis not present

## 2015-01-08 LAB — POCT GLYCOSYLATED HEMOGLOBIN (HGB A1C): Hemoglobin A1C: 5.8

## 2015-01-08 NOTE — Progress Notes (Signed)
    Subjective   [name] is a 69 y.o. female that presents for a same day visit  1. Left knee osteoarthritis: Pain is about 7/10 with movement. She last received a joint injection for about three weeks to one month. She has followed up with orthopedic surgery about a total knee replacement but is not yet ready since she has multiple other health concerns ongoing.  2. Breast lump: first noticed a week ago and has been shrinking since then. The area is non-tender unless disturbed. No nipple discharge. Last mammogram earlier this year and was normal. No family history or personal history of breast cancer.  ROS Per HPI  Social History  Substance Use Topics  . Smoking status: Never Smoker   . Smokeless tobacco: Never Used  . Alcohol Use: No    Allergies  Allergen Reactions  . Ace Inhibitors Anaphylaxis and Swelling  . Azilect [Rasagiline Mesylate] Other (See Comments)    hypotension  . Lisinopril Swelling    Severe facial angioedema requiring hospitalization 2007 (approx)    Objective   BP 164/109 mmHg  Pulse 71  Temp(Src) 98.3 F (36.8 C) (Oral)  Wt 180 lb (81.647 kg)  General: Well appearing, no distress Respiratory/Chest: Cystic lesion located subcutaneously on lateral aspect of left breast that is nontender Musculoskeletal: Right knee with crepitus. Bilateral joint line tenderness. Flexion to 90 degrees with extension to 0 degrees. No swelling, erythema or overt deformity  INJECTION: Patient was given informed consent, signed copy in the chart. Appropriate time out was taken. Area prepped and draped in usual sterile fashion. 1 cc of methylprednisolone 40 mg/ml plus 4 cc of 1% lidocaine without epinephrine was injected into the left knee using a(n) anterior medial approach. The patient tolerated the procedure well. There were no complications. Post procedure instructions were given.  Assessment and Plan   No orders of the defined types were placed in this  encounter.    Left knee osteoarthritis: chronic issue. Does not want surgery.  Knee injection of left knee  Follow-up with PCP  Return precautions  Right breast cyst: resolving spontaneously. No family history of breast cancer. Last mammogram without abnormality  If present in two weeks, will order diagnostic mammogram and/or ultrasound  Preceptor: Dr. Mingo Amber

## 2015-01-08 NOTE — Patient Instructions (Addendum)
Thank you for coming to see me today. It was a pleasure. Today we talked about:   Left knee pain: I gave you a knee injection. This should help your pain for some time. Please make an appointment to see Dr. Nori Riis for follow-up  Breast lesion: this appears to be a cyst. Since it is going down, this is good news. If it goes away, no follow-up. If it persists for the next two weeks, please call us so we can schedule a mammogram.  If you have any questions or concerns, please do not hesitate to call the office at [phone].  Sincerely,  Cordelia Poche, MD

## 2015-01-09 DIAGNOSIS — E119 Type 2 diabetes mellitus without complications: Secondary | ICD-10-CM | POA: Diagnosis not present

## 2015-01-09 DIAGNOSIS — Z23 Encounter for immunization: Secondary | ICD-10-CM | POA: Diagnosis not present

## 2015-01-09 MED ORDER — METHYLPREDNISOLONE ACETATE 80 MG/ML IJ SUSP: 40.0000 mg | Freq: Once | INTRAMUSCULAR | Status: DC

## 2015-01-09 MED ORDER — CYANOCOBALAMIN 1000 MCG/ML IJ SOLN: 1000.0000 ug | Freq: Once | INTRAMUSCULAR | Status: DC

## 2015-01-09 NOTE — Addendum Note (Signed)
Addended by: Londell Moh T on: 01/09/2015 10:47 AM   Modules accepted: Orders

## 2015-01-09 NOTE — Addendum Note (Signed)
Addended by: Londell Moh T on: 01/09/2015 10:28 AM   Modules accepted: Orders

## 2015-02-13 ENCOUNTER — Encounter: Payer: Self-pay | Admitting: Diagnostic Neuroimaging

## 2015-02-13 ENCOUNTER — Ambulatory Visit (INDEPENDENT_AMBULATORY_CARE_PROVIDER_SITE_OTHER): Payer: Commercial Managed Care - HMO | Admitting: Diagnostic Neuroimaging

## 2015-02-13 VITALS — BP 111/65 | HR 58 | Ht 64.0 in | Wt 174.5 lb

## 2015-02-13 DIAGNOSIS — G20 Parkinson's disease: Secondary | ICD-10-CM

## 2015-02-13 NOTE — Progress Notes (Signed)
[facility]  PATIENT: [name] DOB: Apr 16, 1945  REFERRING CLINICIAN:  HISTORY FROM: patient and daughter REASON FOR VISIT: follow up   HISTORICAL  CHIEF COMPLAINT:  Chief Complaint  Patient presents with  . Parkinson's disease    rm 7, husband- [name], discuss medications  . Follow-up    4 month    HISTORY OF PRESENT ILLNESS:   UPDATE 02/13/15: Since last visit, continues to have tremor, tongue and mouth movments (better), sweating, anxiety, inverted sleep schedule.   UPDATE 10/09/14: Since last visit, had increased carb/levo up to 2.5 tabs TID (even though I had recommended going up to 2tabs TID; she's not sure why she did this). More tongue and mouth movements. More falls. Went to hospital for UTI, AKI, N/V/diarrhea In March 2016.   UPDATE 04/04/14: Since last visit, she reports that she is stable from PD standpoint. Tremor and gait are stable. Tolerating carb/levo 1.5 tabs TID. Hasn't tried going up to 2 tabs TID. Re: swallowing issues, she reports remote esophagus surgery for swallow issues 15 yrs ago. Feels that this is a problem again, and wants to see GI. Re: rash, it turns out that they had bedbugs in their home; now problem is resolved. Struggling with diarrhea and excess sweating.  UPDATE 12/26/13: Since last visit, was hospitalized (2 weeks ago) for delirium, hallucinations, chest pain, UTI, and medication overuse. Was taking ambien and a friend's xanax to help her sleep (was struggling with anxiety and insomnia; not seen Dr. Casimiro Needle in a while). Now back at home and doing better. Tremor and gait are worsening. Some wearing off (early AM and late evening). Also with 2 weeks are night time itching and rash. Going to see dermatology soon.   UPDATE 01/18/13: Since last visit, now on carb/levo 1.5 tabs TID. Nausea is improved since she started taking her metformin at different time than carb/levo. Tremor, balance, coordination are  stable.  UPDATE 06/01/12: Since last visit, tried carb/levo 1.5 tabs TID x 1 week, then stopped. Felt like it was too much medicine. Did not have increased side effects. Has been struggling with nausea throughout. Tremor is persistent.  UPDATE 01/28/12: Doing well. No wearing off. Tolerating meds. Stopped azilect (per PCP for nausea). Now on fluoxetine for depression.  UPDATE 08/27/10: Doing better on carb/levo.  Taking 1 tab TID (6am, 4pm, 9pm).  Minimal nausea.  Wakes up with more tremor in AM, then reduction in tremor 1min after dose.  Effect wears off around 4pm.  Daughter reports some intermittent confusion.  PRIOR HPI (06/26/10): 69 yo Caucasian female referred to [place] for tremor with concern for possible Parkinson's disease. Ms. Spaziani notes she first noticed a resting tremor in her left hand about 3 years ago, and this has gotten progressively worse since then. It has also spread to now involve her mouth and right hand as well, though she notes it is still worst in her left hand. Stress and anxiety can accentuate the tremors, whereas active use can reduce them. She also notes the tremors being worse in the morning. She denies noting anything else that seems to make the tremors better or worse. She admits to what seems like possible bradykinesia, in her words that she has "a hard time getting going sometimes," but denies any freezing. She does note some new difficulty with writing, but denies micrographia. She also admits to feeling like her balance and walking is "a bit off," but she relates this more to the osteoarthritis in  her knees and having had surgery on her R knee. She denies orthostatic symptoms, hallucinations or delusions, difficulty standing or sitting, weakness, new visual changes (aside from her macular degeneration), or feelings of rigidity. She also denies bizarre dreams, nightmares, RLS symptoms, or REM behavior disorder symptoms. She admits to having two uncles who have tremors,  etiology unclear, but adds that one uncle had alcoholism (and it was believed this was the cause). She is concerned about what is causing her tremors, and admits that although she initially "put off" being evaluated further, she is anxious to know what might be the cause of her tremors.  REVIEW OF SYSTEMS: Full 14 system review of systems performed and notable only for excess sweating runny nose trouble swallowing diarrhea incontinence freq waking back pain depression anxiety.   ALLERGIES: Allergies  Allergen Reactions  . Ace Inhibitors Anaphylaxis and Swelling  . Azilect [Rasagiline Mesylate] Other (See Comments)    hypotension  . Lisinopril Swelling    Severe facial angioedema requiring hospitalization 2007 (approx)    HOME MEDICATIONS: Outpatient Prescriptions Prior to Visit  Medication Sig Dispense Refill  . aspirin 81 MG tablet Take 1 tablet (81 mg total) by mouth daily. 30 tablet   . carbidopa-levodopa (SINEMET) 25-100 MG per tablet Take 2 tablets by mouth 3 (three) times daily. 180 tablet 12  . gabapentin (NEURONTIN) 300 MG capsule Take 600 mg by mouth 2 (two) times daily.     Marland Kitchen olmesartan (BENICAR) 20 MG tablet Take 1 tablet (20 mg total) by mouth daily. 30 tablet 12  . omeprazole (PRILOSEC) 40 MG capsule TAKE ONE CAPSULE BY MOUTH EVERY DAY 90 capsule 0  . ONE TOUCH ULTRA TEST test strip USE AS DIRECTED 100 each 12  . ONETOUCH DELICA LANCETS 79G MISC Daily for Glucose Testing 300 each 1  . propranolol ER (INDERAL LA) 80 MG 24 hr capsule TAKE ONE CAPSULE BY MOUTH DAILY 90 capsule 3  . sertraline (ZOLOFT) 100 MG tablet Take 100 mg by mouth daily.     . traMADol (ULTRAM) 50 MG tablet 1-2 tabs po bid prn pain 120 tablet 1  . furosemide (LASIX) 40 MG tablet Take 40 mg by mouth daily.    . metFORMIN (GLUCOPHAGE) 850 MG tablet Take 1 tablet (850 mg total) by mouth daily with breakfast. (Patient not taking: Reported on 02/13/2015) 90 tablet 3  . methylPREDNISolone acetate (DEPO-MEDROL)  80 MG/ML injection Inject 0.5 mLs (40 mg total) into the articular space once. (Patient not taking: Reported on 02/13/2015) 5 mL 0  . omeprazole (PRILOSEC) 40 MG capsule Take 40 mg by mouth daily.   0  . ondansetron (ZOFRAN) 4 MG tablet Take 1 tablet (4 mg total) by mouth every 8 (eight) hours as needed for nausea or vomiting. (Patient not taking: Reported on 02/13/2015) 20 tablet 2  . potassium chloride (K-DUR) 10 MEQ tablet Take 1 tablet (10 mEq total) by mouth daily.    Marland Kitchen atorvastatin (LIPITOR) 80 MG tablet Take 1 tablet (80 mg total) by mouth daily. 90 tablet 3  . cephALEXin (KEFLEX) 500 MG capsule Take 1 capsule (500 mg total) by mouth 4 (four) times daily. For an extra 7 days 28 capsule 0  . eszopiclone (LUNESTA) 2 MG TABS tablet      Facility-Administered Medications Prior to Visit  Medication Dose Route Frequency Provider Last Rate Last Dose  . cyanocobalamin ((VITAMIN B-12)) injection 1,000 mcg  1,000 mcg Intramuscular Once Mariel Aloe, MD  PAST MEDICAL HISTORY: Past Medical History  Diagnosis Date  . Allergy   . Anxiety   . Hyperlipidemia   . Movement disorder   . Hypertension   . Scoliosis   . Esophageal stricture   . Cognitive impairment 12/11/2013  . Parkinson's disease ([place])   . Complication of anesthesia 07/2008    "hard to get me woke up when I had my knee replaced; they said they had to bring me back"  . Chronic bronchitis ([place])     "get it q yr"  . Type II diabetes mellitus ([place])   . GERD (gastroesophageal reflux disease)   . Arthritis     "knees, back" (06/29/2014)  . Osteoarthritis   . Chronic mid back pain   . History of gout 1970's  . Situational depression   . UTI (lower urinary tract infection)     PAST SURGICAL HISTORY: Past Surgical History  Procedure Laterality Date  . 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 Esophagogastric fundoplasty      some type "esoph surgery" per pt  . Colon surgery    . Abdominal hysterectomy    . Cholecystectomy  open    . Joint replacement    . Dilation and curettage of uterus    . Tubal ligation      FAMILY HISTORY: Family History  Problem Relation Age of [place]  . Cerebral palsy Son   . Heart disease Mother   . Diabetes Mother   . Heart disease Father     SOCIAL HISTORY:  Social History   Social History  . Marital Status: Married    Spouse Name: [name]  . Number of Children: 2  . Years of Education: 14   Occupational History  . Retired    Social History Main Topics  . Smoking status: Never Smoker   . Smokeless tobacco: Never Used  . Alcohol Use: No  . Drug Use: No  . Sexual Activity: Not on file   Other Topics Concern  . Not on file   Social History Narrative   Health Care POA:    Emergency Contact: daughter, [name], [phone] husband, [name] [phone]   End of Life Plan:   Who lives with you: Lives with husband and son, [name]   Any pets: Rabbit, [name]   Diet: Patient has a varied diet but struggles with what to eat with hypertension, diabetes, parkinsons   Exercise: Patient does not have any regular exercise routine.   Seatbelts: Patient reports wearing her seatbelt when in vehicle.   Nancy Fetter Exposure/Protection: Patient reports wearing sun block lotion daily.   Hobbies: Watching game shows, writing poetry, writing in journal, volunteering at day program with son.    Caffeine Use: very little occasional             PHYSICAL EXAM  Filed Vitals:   02/13/15 1350  BP: 111/65  Pulse: 58  Height: 5\' 4"  (1.626 m)  Weight: 174 lb 8 oz (79.153 kg)    Not recorded      Body mass index is 29.94 kg/(m^2).  GENERAL EXAM: General: Patient is awake, alert and in no acute distress.  Well developed and groomed. Neck: Neck is supple. Cardiovascular: No carotid artery bruits.  Heart is regular rate and rhythm with no murmurs.  Neurologic Exam  Mental Status: Awake, alert. Language is fluent and comprehension intact. Cranial Nerves: Pupils are equal and  reactive to light.  Visual fields are full to confrontation.  Conjugate eye movements are full and  symmetric.  Facial sensation and strength are symmetric.  Hearing is intact.  Palate elevated symmetrically and uvula is midline.  Shoulder shrug is symmetric.  Tongue is midline. MILD OROLINGUAL DYSKINESIAS. Motor: INTERMITTENT RESTING TREMOR OF BUE AND MOUTH. MINIMAL POSTURAL TREMOR. BRADYKINESIA AND RIGIDITY IN LUE>RUE, LLE>RLE. Normal bulk. Full strength in the upper and lower extremities.  No pronator drift. Sensory: Intact and symmetric to light touch. Coordination: No ataxia or dysmetria on finger-nose or rapid alternating movement testing. Reflexes: BUE and BLE present and symmetric Gait and Station: Narrow based gait. DECR ARM SWING. DYSTONIA POSTURING OF HAND WHILE WALKING   DIAGNOSTIC DATA (LABS, IMAGING, TESTING) - I reviewed patient records, labs, notes, testing and imaging myself where available.  Lab Results  Component Value Date   WBC 9.8 07/28/2014   HGB 14.4 07/28/2014   HCT 42.0 07/28/2014   MCV 89.2 07/28/2014   PLT 429* 07/28/2014      Component Value Date/Time   NA 140 08/09/2014 1051   K 3.8 08/09/2014 1051   CL 105 08/09/2014 1051   CO2 26 08/09/2014 1051   GLUCOSE 102* 08/09/2014 1051   BUN 13 08/09/2014 1051   CREATININE 0.76 08/09/2014 1051   CREATININE 1.14* 06/30/2014 0430   CALCIUM 9.1 08/09/2014 1051   PROT 7.4 07/28/2014 1029   ALBUMIN 4.5 07/28/2014 1029   AST 17 07/28/2014 1029   ALT 8 07/28/2014 1029   ALKPHOS 118* 07/28/2014 1029   BILITOT 1.1 07/28/2014 1029   GFRNONAA 48* 06/30/2014 0430   GFRAA 56* 06/30/2014 0430   Lab Results  Component Value Date   CHOL 244* 06/07/2014   HDL 38.60* 06/07/2014   LDLCALC 123* 12/11/2013   LDLDIRECT 140.0 06/07/2014   TRIG 384.0* 06/07/2014   CHOLHDL 6 06/07/2014   Lab Results  Component Value Date   HGBA1C 5.8 01/08/2015   Lab Results  Component Value Date   VITAMINB12 301 01/05/2008   Lab  Results  Component Value Date   TSH 0.517 03/31/2014    07/05/10 MRI BRAIN - minimal scattered chronic small vessel ischemic disease   ASSESSMENT AND PLAN  69 y.o. female with resting tremor of BUE, cogwheel rigidity, bradykinesia, decreased arm swing, anxiety, diff swallowing, memory loss. Some progression of symptoms.  Dx: parkinson's disease   PLAN: I spent 15 minutes of face to face time with patient. Greater than 50% of time was spent in counseling and coordination of care with patient. In summary we discussed:  1. Reduce carb/levo 25/100 down to 1.5 tabs TID (due to dyskinesias) 2. Recommend more supervision at home with medications and ADLs  Orders Placed This Encounter  Procedures  . Ambulatory referral to Physical Therapy  . Ambulatory referral to Speech Therapy   Return in about 6 months (around 08/13/2015).    Penni Bombard, MD 16/12/6787, 3:81 PM Certified in Neurology, Neurophysiology and Neuroimaging  [facility] [address] [place], [place] [address] [phone]

## 2015-02-13 NOTE — Patient Instructions (Signed)
1. Reduce carb/levo 25/100 down to 1.5 tabs TID (due to dyskinesias) 2. Recommend more supervision at home with medications and ADLs 3. Physical and speech therapy will be set up.

## 2015-02-14 ENCOUNTER — Ambulatory Visit: Payer: Commercial Managed Care - HMO | Admitting: Family Medicine

## 2015-02-14 ENCOUNTER — Encounter: Payer: Self-pay | Admitting: Family Medicine

## 2015-02-14 ENCOUNTER — Ambulatory Visit (INDEPENDENT_AMBULATORY_CARE_PROVIDER_SITE_OTHER): Payer: Commercial Managed Care - HMO | Admitting: Family Medicine

## 2015-02-14 VITALS — BP 131/64 | HR 78 | Temp 97.5°F | Ht 64.0 in | Wt 174.0 lb

## 2015-02-14 DIAGNOSIS — G20A1 Parkinson's disease without dyskinesia, without mention of fluctuations: Secondary | ICD-10-CM

## 2015-02-14 DIAGNOSIS — G20 Parkinson's disease: Secondary | ICD-10-CM

## 2015-02-14 DIAGNOSIS — I10 Essential (primary) hypertension: Secondary | ICD-10-CM

## 2015-02-14 DIAGNOSIS — R946 Abnormal results of thyroid function studies: Secondary | ICD-10-CM

## 2015-02-14 DIAGNOSIS — E119 Type 2 diabetes mellitus without complications: Secondary | ICD-10-CM

## 2015-02-14 DIAGNOSIS — H9319 Tinnitus, unspecified ear: Secondary | ICD-10-CM

## 2015-02-14 DIAGNOSIS — E785 Hyperlipidemia, unspecified: Secondary | ICD-10-CM

## 2015-02-14 DIAGNOSIS — R7989 Other specified abnormal findings of blood chemistry: Secondary | ICD-10-CM

## 2015-02-14 LAB — COMPREHENSIVE METABOLIC PANEL
ALT: 3 U/L — ABNORMAL LOW (ref 6–29)
AST: 16 U/L (ref 10–35)
Albumin: 4.2 g/dL (ref 3.6–5.1)
Alkaline Phosphatase: 88 U/L (ref 33–130)
BUN: 15 mg/dL (ref 7–25)
CO2: 27 mmol/L (ref 20–31)
Calcium: 9.6 mg/dL (ref 8.6–10.4)
Chloride: 105 mmol/L (ref 98–110)
Creat: 1.24 mg/dL — ABNORMAL HIGH (ref 0.50–0.99)
Glucose, Bld: 109 mg/dL — ABNORMAL HIGH (ref 65–99)
Potassium: 4.1 mmol/L (ref 3.5–5.3)
Sodium: 139 mmol/L (ref 135–146)
Total Bilirubin: 0.5 mg/dL (ref 0.2–1.2)
Total Protein: 6.7 g/dL (ref 6.1–8.1)

## 2015-02-14 LAB — LDL CHOLESTEROL, DIRECT: Direct LDL: 108 mg/dL (ref <130)

## 2015-02-14 LAB — TSH: TSH: 1.174 u[IU]/mL (ref 0.350–4.500)

## 2015-02-14 LAB — POCT GLYCOSYLATED HEMOGLOBIN (HGB A1C): Hemoglobin A1C: 5.8

## 2015-02-14 MED ORDER — FLUTICASONE PROPIONATE 50 MCG/ACT NA SUSP: 2.0000 | Freq: Every day | NASAL | Status: DC

## 2015-02-14 NOTE — Patient Instructions (Signed)
Great to see you---I am glad you are doing well! Let me see you back in about 6 weeks to follow up the ringing in the ear. I am starting you on a nasal spray---use it daily. I will send you a note about your blood work.

## 2015-02-15 ENCOUNTER — Other Ambulatory Visit: Payer: Self-pay | Admitting: Family Medicine

## 2015-02-15 ENCOUNTER — Encounter: Payer: Self-pay | Admitting: Family Medicine

## 2015-02-15 DIAGNOSIS — N183 Chronic kidney disease, stage 3 unspecified: Secondary | ICD-10-CM | POA: Insufficient documentation

## 2015-02-15 DIAGNOSIS — H9319 Tinnitus, unspecified ear: Secondary | ICD-10-CM | POA: Insufficient documentation

## 2015-02-15 DIAGNOSIS — N182 Chronic kidney disease, stage 2 (mild): Principal | ICD-10-CM

## 2015-02-15 DIAGNOSIS — E1122 Type 2 diabetes mellitus with diabetic chronic kidney disease: Secondary | ICD-10-CM

## 2015-02-15 HISTORY — DX: Tinnitus, unspecified ear: H93.19

## 2015-02-15 NOTE — Assessment & Plan Note (Signed)
Well-controlled. We'll check some lab work. No medication changes.

## 2015-02-15 NOTE — Assessment & Plan Note (Signed)
We'll check some lab work today. Right now we'll continue on the one tablet of metformin 850 mg daily. Want to look at her creatinine as well as her hemoglobin A1c. Her weight has been stable. Follow-up 3 months.

## 2015-02-15 NOTE — Progress Notes (Addendum)
   Subjective:    Patient ID: [name], female    DOB: 05/18/1945, 69 y.o.   MRN: [id number]  HPI  #1. Follow diabetes mellitus. She's taking one metformin tab a day. She's not checking her sugars. She's not had any episodes of low blood sugar or at least no symptoms of that. No increased frequency of urination or unusual thirst. No unusual weight change. #2. Parkinson's disease: Saw her neurologist yesterday. They adjusted her medication downward slightly. She has been having a lot of diarrhea wonders if this could have been partly a result of the medication dose she was on #3. Hypertension: Taking her medicines regularly without any problem. No side effects. No problems affording them. Denies chest pains, denies shortness of breath, no lower extremity edema.  #4. Social stressors: Things at home are pretty stable right now. #5. Tinnitus. Has been having some intermittent ringing in her ears. This is been occurring over the last 4-6 weeks. Not sure which related to wonders if it's related with the medicine. Happens at all times of the day. Does not awaken her at night. She does have problems with allergies and she wonders if this is related to that.  Review of Systems See history of present illness    Objective:   Physical Exam Vital signs reviewed. GENERAL: Well-developed, well-nourished, no acute distress. HEENT: TMs somewhat retracted. CARDIOVASCULAR: Regular rate and rhythm no murmur gallop or rub LUNGS: Clear to auscultation bilaterally, no rales or wheeze. ABDOMEN: Soft positive bowel sounds NEURO: Cogwheel rigidity bilateral upper extremities. She has orolingual dyskinesias. Some postural tremor. MSK: Movement of extremity x 4. Full strength bilateral upper and lower extremities. She can rise from a chair without any assistance. Her gait is normal. PSYCHIATRIC: Alert and oriented 4. Affect interactive. Speech is normal in content and fluency. Cognition is  intact.         Assessment & Plan:

## 2015-02-15 NOTE — Assessment & Plan Note (Signed)
We'll try some Flonase to see if this helps. They just decrease some of her Parkinson's medicines, so that may help as well. I like to see her back in 4-6 weeks to follow-up this.

## 2015-02-15 NOTE — Assessment & Plan Note (Signed)
She saw her neurologist yesterday after a somewhat lengthy absence from follow-up with neurology. They made some medication changes.

## 2015-03-02 ENCOUNTER — Other Ambulatory Visit: Payer: Self-pay | Admitting: Family Medicine

## 2015-03-13 ENCOUNTER — Ambulatory Visit: Payer: Commercial Managed Care - HMO

## 2015-03-13 ENCOUNTER — Ambulatory Visit: Payer: Commercial Managed Care - HMO | Admitting: Physical Therapy

## 2015-03-14 ENCOUNTER — Telehealth: Payer: Self-pay | Admitting: Family Medicine

## 2015-03-14 ENCOUNTER — Ambulatory Visit: Payer: Commercial Managed Care - HMO | Admitting: Family Medicine

## 2015-03-14 NOTE — Telephone Encounter (Signed)
Spoke with Dr. Nori Riis and patient can not be treated over the phone for possible UTI.  Tried calling michelle back but number given for her is incorrect.  Spoke with patient and she declined having any UTI symptoms or any issues that required an appt.  Advised patient to call us back when she was in need of an appt. Ellar Hakala,CMA

## 2015-03-14 NOTE — Telephone Encounter (Signed)
Pt has an appt at 10:15 today but she will not come to the appt.  She is very angry and not thinking clearly.  This are the classic symptons for UTI in the past. Daughter doesn't know what to do Please advise

## 2015-03-14 NOTE — Telephone Encounter (Signed)
Will forward to MD. Cailean Heacock,CMA  

## 2015-03-21 ENCOUNTER — Encounter (HOSPITAL_COMMUNITY): Payer: Self-pay

## 2015-03-21 ENCOUNTER — Telehealth: Payer: Self-pay | Admitting: Family Medicine

## 2015-03-21 ENCOUNTER — Emergency Department (HOSPITAL_COMMUNITY)
Admission: EM | Admit: 2015-03-21 | Discharge: 2015-03-21 | Disposition: A | Discharge status: Home / Self Care | Payer: Commercial Managed Care - HMO | Attending: Emergency Medicine | Admitting: Emergency Medicine

## 2015-03-21 DIAGNOSIS — G20 Parkinson's disease: Secondary | ICD-10-CM | POA: Insufficient documentation

## 2015-03-21 DIAGNOSIS — Z8709 Personal history of other diseases of the respiratory system: Secondary | ICD-10-CM | POA: Insufficient documentation

## 2015-03-21 DIAGNOSIS — Y9389 Activity, other specified: Secondary | ICD-10-CM | POA: Insufficient documentation

## 2015-03-21 DIAGNOSIS — S0083XA Contusion of other part of head, initial encounter: Secondary | ICD-10-CM | POA: Insufficient documentation

## 2015-03-21 DIAGNOSIS — F329 Major depressive disorder, single episode, unspecified: Secondary | ICD-10-CM | POA: Diagnosis not present

## 2015-03-21 DIAGNOSIS — E119 Type 2 diabetes mellitus without complications: Secondary | ICD-10-CM | POA: Insufficient documentation

## 2015-03-21 DIAGNOSIS — Y9289 Other specified places as the place of occurrence of the external cause: Secondary | ICD-10-CM | POA: Diagnosis not present

## 2015-03-21 DIAGNOSIS — Z79899 Other long term (current) drug therapy: Secondary | ICD-10-CM | POA: Diagnosis not present

## 2015-03-21 DIAGNOSIS — S4992XA Unspecified injury of left shoulder and upper arm, initial encounter: Secondary | ICD-10-CM | POA: Diagnosis present

## 2015-03-21 DIAGNOSIS — F419 Anxiety disorder, unspecified: Secondary | ICD-10-CM | POA: Insufficient documentation

## 2015-03-21 DIAGNOSIS — I10 Essential (primary) hypertension: Secondary | ICD-10-CM | POA: Insufficient documentation

## 2015-03-21 DIAGNOSIS — M419 Scoliosis, unspecified: Secondary | ICD-10-CM | POA: Diagnosis not present

## 2015-03-21 DIAGNOSIS — R51 Headache: Secondary | ICD-10-CM | POA: Diagnosis not present

## 2015-03-21 DIAGNOSIS — Z7982 Long term (current) use of aspirin: Secondary | ICD-10-CM | POA: Insufficient documentation

## 2015-03-21 DIAGNOSIS — Y998 Other external cause status: Secondary | ICD-10-CM | POA: Diagnosis not present

## 2015-03-21 DIAGNOSIS — G8929 Other chronic pain: Secondary | ICD-10-CM | POA: Diagnosis not present

## 2015-03-21 DIAGNOSIS — Z87440 Personal history of urinary (tract) infections: Secondary | ICD-10-CM | POA: Diagnosis not present

## 2015-03-21 DIAGNOSIS — Z7984 Long term (current) use of oral hypoglycemic drugs: Secondary | ICD-10-CM | POA: Insufficient documentation

## 2015-03-21 DIAGNOSIS — Z8719 Personal history of other diseases of the digestive system: Secondary | ICD-10-CM | POA: Diagnosis not present

## 2015-03-21 DIAGNOSIS — M159 Polyosteoarthritis, unspecified: Secondary | ICD-10-CM | POA: Insufficient documentation

## 2015-03-21 DIAGNOSIS — S40212A Abrasion of left shoulder, initial encounter: Secondary | ICD-10-CM | POA: Diagnosis not present

## 2015-03-21 NOTE — ED Notes (Signed)
Unable to have patient E sign, E signature locked with outpatient rehab in [place]. Pt. And family aware of d/c instructions and follow up measures. Pt. Ambulatory and stable on d/c.
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
## 2015-03-21 NOTE — ED Notes (Signed)
Per EMS - pt c/o hypertension. Initial BP 190/120 and last BP 185/100. Pt initially anxious upon EMS arrival, pt had signed refusal for transport when family member walked in house and pt decided she wanted to come to ED. Pt requesting no visitors at this time except [place].
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
## 2015-03-21 NOTE — ED Notes (Signed)
Social Work at beside.
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
## 2015-03-26 ENCOUNTER — Ambulatory Visit (HOSPITAL_COMMUNITY)
Admission: RE | Admit: 2015-03-26 | Discharge: 2015-03-26 | Disposition: A | Discharge status: Home / Self Care | Payer: Commercial Managed Care - HMO | Attending: Psychiatry | Admitting: Psychiatry

## 2015-03-26 NOTE — BH Assessment (Signed)
Patient reports that she did not want to receive an assessment.  Patient reports that she wants an appointment for outpatient therapy.  Writer gave the patient and her daughter the number to outpatient therapy.

## 2015-03-27 DIAGNOSIS — F3341 Major depressive disorder, recurrent, in partial remission: Secondary | ICD-10-CM | POA: Diagnosis not present

## 2015-03-27 NOTE — Telephone Encounter (Signed)
D:  Received email from TTS (Ava) re: pt.  It was instructed to call pt on daughter's phone.  A:  Placed call to daughter [phone]).  According to daughter, pt was in a meeting at this time.  Daughter states she would have pt to call after the meeting to discuss MH-IOP.  Inform Ava (TTS).

## 2015-03-28 ENCOUNTER — Ambulatory Visit (INDEPENDENT_AMBULATORY_CARE_PROVIDER_SITE_OTHER): Payer: Commercial Managed Care - HMO | Admitting: Family Medicine

## 2015-03-28 ENCOUNTER — Ambulatory Visit: Payer: Commercial Managed Care - HMO | Admitting: Physical Therapy

## 2015-03-28 ENCOUNTER — Encounter: Payer: Self-pay | Admitting: Family Medicine

## 2015-03-28 ENCOUNTER — Ambulatory Visit: Payer: Commercial Managed Care - HMO | Attending: Family Medicine

## 2015-03-28 VITALS — BP 133/76 | HR 89 | Temp 97.8°F | Ht 64.0 in | Wt 169.5 lb

## 2015-03-28 DIAGNOSIS — F1910 Other psychoactive substance abuse, uncomplicated: Secondary | ICD-10-CM

## 2015-03-28 DIAGNOSIS — R350 Frequency of micturition: Secondary | ICD-10-CM | POA: Diagnosis not present

## 2015-03-28 DIAGNOSIS — R258 Other abnormal involuntary movements: Secondary | ICD-10-CM | POA: Insufficient documentation

## 2015-03-28 DIAGNOSIS — R2681 Unsteadiness on feet: Secondary | ICD-10-CM | POA: Insufficient documentation

## 2015-03-28 DIAGNOSIS — R471 Dysarthria and anarthria: Secondary | ICD-10-CM | POA: Diagnosis not present

## 2015-03-28 DIAGNOSIS — R269 Unspecified abnormalities of gait and mobility: Secondary | ICD-10-CM | POA: Insufficient documentation

## 2015-03-28 DIAGNOSIS — IMO0002 Reserved for concepts with insufficient information to code with codable children: Secondary | ICD-10-CM

## 2015-03-28 DIAGNOSIS — G20 Parkinson's disease: Secondary | ICD-10-CM

## 2015-03-28 DIAGNOSIS — G894 Chronic pain syndrome: Secondary | ICD-10-CM

## 2015-03-28 LAB — POCT URINALYSIS DIPSTICK
Glucose, UA: NEGATIVE
Ketones, UA: NEGATIVE
Leukocytes, UA: NEGATIVE
Nitrite, UA: NEGATIVE
Spec Grav, UA: >=1.03
Urobilinogen, UA: 1
pH, UA: 5.5

## 2015-03-29 DIAGNOSIS — R269 Unspecified abnormalities of gait and mobility: Secondary | ICD-10-CM | POA: Diagnosis not present

## 2015-03-29 DIAGNOSIS — R258 Other abnormal involuntary movements: Secondary | ICD-10-CM | POA: Diagnosis not present

## 2015-03-29 DIAGNOSIS — R471 Dysarthria and anarthria: Secondary | ICD-10-CM | POA: Diagnosis not present

## 2015-03-29 DIAGNOSIS — R2681 Unsteadiness on feet: Secondary | ICD-10-CM | POA: Diagnosis not present

## 2015-03-29 LAB — DRUG SCR UR, PAIN MGMT, REFLEX CONF
Amphetamine Screen, Ur: NEGATIVE
Barbiturate Quant, Ur: NEGATIVE
Cocaine Metabolites: NEGATIVE
Creatinine,U: 591.48 mg/dL
Methadone: NEGATIVE
Opiates: NEGATIVE
Phencyclidine (PCP): NEGATIVE
Propoxyphene: NEGATIVE

## 2015-03-29 NOTE — Therapy (Signed)
[facility] [address] [place], [place], [address] Phone: [phone]   Fax:  [phone]  Speech Language Pathology Evaluation  Patient Details  Name: [name] MRN: [id number] Date of Birth: 06-06-1945 No Data Recorded  Encounter Date: 03/28/2015      End of Session - 03/28/15 1356    Visit Number 1   Number of Visits 17   Date for SLP Re-Evaluation 05/28/15   Authorization Type [employer]arthur Rossetti - auth will be obtained   SLP Start Time 1104   SLP Stop Time  1146   SLP Time Calculation (min) 42 min   Activity Tolerance Patient tolerated treatment well  appeared anxious at times      Past Medical History  Diagnosis Date  . Allergy   . Anxiety   . Hyperlipidemia   . Movement disorder   . Hypertension   . Scoliosis   . Esophageal stricture   . Cognitive impairment 12/11/2013  . Parkinson's disease ([place])   . Complication of anesthesia 07/2008    "hard to get me woke up when I had my knee replaced; they said they had to bring me back"  . Chronic bronchitis ([place])     "get it q yr"  . Type II diabetes mellitus ([place])   . GERD (gastroesophageal reflux disease)   . Arthritis     "knees, back" (06/29/2014)  . Osteoarthritis   . Chronic mid back pain   . History of gout 1970's  . Situational depression   . UTI (lower urinary tract infection)     Past Surgical History  Procedure Laterality Date  . 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 Esophagogastric fundoplasty      some type "esoph surgery" per pt  . Colon surgery    . Abdominal hysterectomy    . Cholecystectomy open    . Joint replacement    . Dilation and curettage of uterus    . Tubal ligation      There were no vitals filed for this visit.  Visit Diagnosis: Dysarthria      Subjective Assessment - 03/28/15 1115    Subjective "The biggest problem for me is that people can't understand you."            SLP Evaluation [facility] - 03/28/15  1116    Pain Assessment   Pain Score 5    Pain Location Back   Pain Type Chronic pain   General Information   HPI Pt reports difficulty with people understanding her for the last couple years.    Prior Functional Status   Cognitive/Linguistic Baseline Information not available   Type of Home House    Lives With Spouse;Son;Friend(s)  son has "a lot of issues", friend "was homeless 5 years ago   Available Support Family  husband "if we can get this court stuff straight", daughter   Cognition   Behaviors --  Appeared anxious at times,    Auditory Comprehension   Overall Auditory Comprehension Appears within functional limits for tasks assessed   Verbal Expression   Overall Verbal Expression Appears within functional limits for tasks assessed   Oral Motor/Sensory Function   Overall Oral Motor/Sensory Function Impaired   Labial ROM Within Functional Limits   Lingual ROM Reduced right;Reduced left   Lingual Symmetry Within Functional Limits   Lingual Coordination WFL  with repeat "[place]" 10 times rapidly   Motor Speech   Overall Motor Speech Impaired    Intelligibility Intelligible  Reports fatigue decreases intelligibility, linguistic complexity noted to decr intelligibility mildly (decr'd articulation)   Effective Techniques Over-articulate;Slow rate                      ADULT SLP TREATMENT - 03/29/15 0001    Cognitive-Linquistic Treatment   Treatment focused on Dysarthria   Skilled Treatment After eval tasks, SLP educated pt re: compensations for dysarthria/imprecise consonants and syllable reduction. SLP then facilitated improved intelligibility with usual SLP min-mod A for maintaining reduced rate and overarticulation in simple conversation of 3 minutes.            SLP Education - 04-15-2015 1356    Education provided Yes   Education Details threrapy course, compensations for decr'd intelligibility   Person(s) Educated Patient   Methods  Explanation;Demonstration;Verbal cues   Comprehension Verbalized understanding;Returned demonstration;Verbal cues required          SLP Short Term Goals - 03/29/15 1405    SLP SHORT TERM GOAL #1   Title pt will engage in 10 minutes conversation with intelligibility 95% for two sessions   Time 4   Period Weeks   Status New   SLP SHORT TERM GOAL #2   Title pt will demo 20/20 sentences intelligible in mod complex tasks    Time 4   Period Weeks   Status New   SLP SHORT TERM GOAL #3   Title pt will tell SLP 3 signs aspiration PNA with rare min A   Time 4   Period Weeks   Status New          SLP Long Term Goals - 03/29/15 1406    SLP LONG TERM GOAL #1   Title pt will demo 15 minutes conversation (mod complex) with intelligibility >/=95% over three sessions   Time 8   Period Weeks   Status New   SLP LONG TERM GOAL #2   Title pt will demo mod complex multi-sentence responses with intelligibility >95% over two sessions   Time 8   Period Weeks   Status New          Plan - 03/29/15 1358    Clinical Impression Statement Pt presents with reported difficulty having people understand her for approx 2 years. Pt with a more complicated medical/neurological/psychological history. Arrived today complaining of "being off" due to sleeping medication, but 25 minutes later telling SLP she "(felt) fine, no problems."  Additionally pt could not pinpoint the exact problems with her speech, volume vs. slurred/mumbling speech, or both. Pt switched back and forth when asked 3 separate times. To SLP pt presents most like mild dysarthria due to imprecise consonants, but this was only mild in nature during today's evaluation. Skilled ST necessary to maximize pt's communication skills  so she can meet her goal of improving communication effectiveness.   Speech Therapy Frequency 2x / week   Duration --  8 weeks   Treatment/Interventions Compensatory strategies;Internal/external aids;SLP instruction  and feedback;Patient/family education;Functional tasks;Cueing hierarchy   Potential to Achieve Goals Fair   Potential Considerations Other (comment)  pt indicated she may not have consistent transportation to and from therapy   Consulted and Agree with Plan of Care Patient          G-Codes - 04/15/2015 1408    Functional Assessment Tool Used noms -6   Functional Limitations Motor speech   Motor Speech Current Status [id number]) At least 1 percent but less than 20 percent impaired, limited or restricted   Motor Speech  Goal Status [phone]) At least 1 percent but less than 20 percent impaired, limited or restricted      Problem List Patient Active Problem List   Diagnosis Date Noted  . Tinnitus 02/15/2015  . CKD stage 2 due to type 2 diabetes mellitus ([place]) 02/15/2015  . Chronic pain syndrome 06/15/2014  . Left knee pain 05/17/2014  . Urticaria, idiopathic 12/21/2013  . Cognitive impairment 12/11/2013  . Dysphagia, unspecified(787.20) 06/02/2013  . Parkinson's disease ([place]) 01/18/2013  . S/P TKR (total knee replacement) 06/09/2012  . Episodic substance abuse 09/20/2010  . Neoplasm of uncertain behavior [id number]  . Esophageal abnormality 08/12/2010  . ARTHRITIS, BACK 03/28/2009  . SCOLIOSIS 03/28/2009  . ROTATOR CUFF SYNDROME 01/24/2009  . CARPAL TUNNEL SYNDROME, LEFT 01/05/2008  . Diabetes mellitus without complication ([place]) [id number]  . Hyperlipidemia 08/05/2006  . Essential hypertension 08/05/2006  . Osteoarthritis of multiple joints 08/04/2006  . Major depressive disorder, recurrent episode ([place]) 06/11/2006    [facility] , [place], [place]   03/29/2015, 2:10 PM  [facility] [address] [place], [place], [address] Phone: [phone]   Fax:  [phone]  Name: [name] MRN: [id number] Date of Birth: 11-28-1945
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
## 2015-03-30 NOTE — Assessment & Plan Note (Signed)
Long discussion spending greater than 50% of our 35 minute office visit in counseling and education regarding illicit drug use.

## 2015-03-30 NOTE — Assessment & Plan Note (Signed)
I have written a letter at her request, copy in the chart. Again I discussed with her the natural course of Parkinson's disease. Use of nonprescribed medications given her medical comorbidities is  extremely risky

## 2015-04-03 ENCOUNTER — Encounter: Payer: Self-pay | Admitting: Family Medicine

## 2015-04-03 LAB — CANNABANOIDS (GC/LC/MS), URINE: THC-COOH (GC/LC/MS), ur confirm: 21 ng/mL — ABNORMAL HIGH (ref <5)

## 2015-04-03 LAB — BENZODIAZEPINES (GC/LC/MS), URINE
Alprazolam metabolite (GC/LC/MS), ur confirm: 26 ng/mL — ABNORMAL HIGH (ref <25)
Clonazepam metabolite (GC/LC/MS), ur confirm: 80 ng/mL — ABNORMAL HIGH (ref <25)
Flurazepam metabolite (GC/LC/MS), ur confirm: NEGATIVE ng/mL (ref <50)
Lorazepam (GC/LC/MS), ur confirm: NEGATIVE ng/mL (ref <50)
Midazolam (GC/LC/MS), ur confirm: NEGATIVE ng/mL (ref <50)
Nordiazepam (GC/LC/MS), ur confirm: NEGATIVE ng/mL (ref <50)
Oxazepam (GC/LC/MS), ur confirm: 7710 ng/mL — ABNORMAL HIGH (ref <50)
Temazepam (GC/LC/MS), ur confirm: NEGATIVE ng/mL (ref <50)
Triazolam metabolite (GC/LC/MS), ur confirm: NEGATIVE ng/mL (ref <50)

## 2015-04-04 ENCOUNTER — Ambulatory Visit: Payer: Commercial Managed Care - HMO | Admitting: Physical Therapy

## 2015-04-04 DIAGNOSIS — R269 Unspecified abnormalities of gait and mobility: Secondary | ICD-10-CM

## 2015-04-04 DIAGNOSIS — R471 Dysarthria and anarthria: Secondary | ICD-10-CM | POA: Diagnosis not present

## 2015-04-04 DIAGNOSIS — R2681 Unsteadiness on feet: Secondary | ICD-10-CM | POA: Diagnosis not present

## 2015-04-04 DIAGNOSIS — R258 Other abnormal involuntary movements: Secondary | ICD-10-CM | POA: Diagnosis not present
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
## 2015-04-06 ENCOUNTER — Telehealth: Payer: Self-pay | Admitting: Family Medicine

## 2015-04-06 NOTE — Telephone Encounter (Signed)
Pt called and would like something called in for her nausea and upset stomach. Please call patient to discuss. jw

## 2015-04-11 ENCOUNTER — Ambulatory Visit: Payer: Commercial Managed Care - HMO | Admitting: Physical Therapy

## 2015-04-11 ENCOUNTER — Ambulatory Visit: Payer: Commercial Managed Care - HMO

## 2015-04-11 VITALS — BP 136/79 | HR 62

## 2015-04-11 DIAGNOSIS — R269 Unspecified abnormalities of gait and mobility: Secondary | ICD-10-CM | POA: Diagnosis not present

## 2015-04-11 DIAGNOSIS — R471 Dysarthria and anarthria: Secondary | ICD-10-CM | POA: Diagnosis not present

## 2015-04-11 DIAGNOSIS — R258 Other abnormal involuntary movements: Secondary | ICD-10-CM | POA: Diagnosis not present

## 2015-04-11 DIAGNOSIS — R2681 Unsteadiness on feet: Secondary | ICD-10-CM | POA: Diagnosis not present

## 2015-04-11 NOTE — Therapy (Signed)
[facility] [address] [place], [place], [address] Phone: [phone]   Fax:  [phone]  Physical Therapy Treatment  Patient Details  Name: [name] MRN: [id number] Date of Birth: Aug 13, 1945 Referring Provider: Leta Baptist  Encounter Date: 04/11/2015      PT End of Session - 04/11/15 1337    Visit Number 3   Number of Visits 9   Date for PT Re-Evaluation 05/27/15   Authorization Type [employer]-No aut required; no visit limit-GCODE every 10th visit   PT Start Time 0848   PT Stop Time 0930   PT Time Calculation (min) 42 min   Equipment Utilized During Treatment Gait belt   Activity Tolerance Patient tolerated treatment well   Behavior During Therapy [facility] for tasks assessed/performed      Past Medical History  Diagnosis Date  . Allergy   . Anxiety   . Hyperlipidemia   . Movement disorder   . Hypertension   . Scoliosis   . Esophageal stricture   . Cognitive impairment 12/11/2013  . Parkinson's disease ([place])   . Complication of anesthesia 07/2008    "hard to get me woke up when I had my knee replaced; they said they had to bring me back"  . Chronic bronchitis ([place])     "get it q yr"  . Type II diabetes mellitus ([place])   . GERD (gastroesophageal reflux disease)   . Arthritis     "knees, back" (06/29/2014)  . Osteoarthritis   . Chronic mid back pain   . History of gout 1970's  . Situational depression   . UTI (lower urinary tract infection)     Past Surgical History  Procedure Laterality Date  . 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 Esophagogastric fundoplasty      some type "esoph surgery" per pt  . Colon surgery    . Abdominal hysterectomy    . Cholecystectomy open    . Joint replacement    . Dilation and curettage of uterus    . Tubal ligation      Filed Vitals:   04/11/15 0850 04/11/15 0901 04/11/15 0906  BP: 157/87 148/75 136/79  Pulse:  56 62    Visit Diagnosis:   Abnormality of gait  Unsteadiness  Bradykinesia      Subjective Assessment - 04/11/15 0850    Subjective A little lightheaded today, but not dizzy.  I decided not to take BP meds today, but I am planning to contact my doctor today.  Denies falls, but feet got twisted yesterday.   Currently in Pain? Yes   Pain Score 6    Pain Location Back   Pain Orientation Lower;Mid;Left   Pain Descriptors / Indicators Aching   Pain Type Chronic pain   Pain Onset More than a month ago   Pain Frequency Constant   Aggravating Factors  moving   Pain Relieving Factors medications alleviate                         [facility] Adult PT Treatment/Exercise - 04/11/15 0001    Transfers   Transfers Sit to Stand;Stand to Sit   Sit to Stand Without upper extremity assist;6: Modified independent (Device/Increase time);From bed   Stand to Sit 6: Modified independent (Device/Increase time);Without upper extremity assist;To bed   Ambulation/Gait   Ambulation/Gait Yes   Ambulation/Gait Assistance 5: Supervision;4: Min guard   Ambulation/Gait Assistance Details Pt tends to look at floor; needs  cues for looking ahead and for arm swing, for heelstrike; pt continues to revert back to looking at floor, even with cues.  Pt ambulates 3 minutes   Ambulation Distance (Feet) 350 Feet   Assistive device None   Gait Pattern Step-through pattern;Decreased arm swing - right;Decreased arm swing - left;Decreased step length - right;Decreased step length - left;Narrow base of support;Decreased trunk rotation;Poor foot clearance - left;Poor foot clearance - right   Ambulation Surface Level;Indoor   Gait Comments Planned to initiate walking program for home today, but pt c/o increased lightheadedness with gait >300 ft (less than 3 minutes).  Discussed use of visual target to assist with improved posture and decreased looking at floor with walking.             Balance Exercises - 04/11/15 0933    OTAGO PROGRAM    Head Movements Sitting;5 reps   Neck Movements Sitting;5 reps   Back Extension Standing;5 reps   Trunk Movements Standing;5 reps   Ankle Movements Sitting;10 reps  2 sets   Knee Extensor 10 reps  2 sets      Self Care:  Discussed orthostatic BP measurements and encouraged pt to speak with her doctor regarding ongoing dizziness/lightheadedness complaints.  Provided initial fall prevention verbal information.  Tried to prepare pt for initiating walking program; however, pt does not appear steady to work on progression of independent gait at this time.  May discuss and trial again with pt's husband's supervision next visit.      PT Education - 04/11/15 0936    Education provided Yes   Education Details orthostatic blood pressure measurements, fall prevention, initiation of OTAGO   Person(s) Educated Patient;Spouse   Methods Explanation;Demonstration;Handout   Comprehension Verbalized understanding;Returned demonstration             PT Long Term Goals - 03/29/15 0750    PT LONG TERM GOAL #1   Title Pt will be independent with HEP for improved functional mobiltiy and balance.  TARGET 04/27/15   Time 4   Period Weeks   Status New   PT LONG TERM GOAL #2   Title Pt will improve TUG score to less than or equal to 13.5 seconds for decreased fall risk.   Time 4   Period Weeks   Status New   PT LONG TERM GOAL #3   Title Pt will improve TUG cognitive to less than or equal to 15 seconds for improved dual tasking with gait.   Time 4   Period Weeks   Status New   PT LONG TERM GOAL #4   Title Pt will improve 5x sit<>stand to less than or equal to 15 seconds for improved transfer efficiency and safety.   Time 4   Period Weeks   Status New   PT LONG TERM GOAL #5   Title Pt will improve push and release test to 2 steps or less, in posterior direction, for improved balance recovery.   Time 4   Period Weeks   Status New   Additional Long Term Goals   Additional Long Term Goals Yes    PT LONG TERM GOAL #6   Title Pt will verbalize understanding of local Parkinson's disease resources.   Time 4   Period Weeks   Status New   PT LONG TERM GOAL #7   Title Pt will verbalize understanding of fall prevention within home environment.   Period Weeks   Status New  Plan - 04/11/15 1338    Clinical Impression Statement Pt with more complaints of lightheadedness today (not dizziness).  Blood pressure measurements/orthostatics taken today, with no significant change in supine or standing.  Increased walking noted to increase "lightheadedness and blurred vision", but pt is noted to be looking at ground during gait and looks ahead briefly with cues.  Possible increased vertical head movements during gait may be contributing to pt's lightheadedness?  Issued OTAGO exercises today, due to pt's c/o lightheadedness with activities and her recent history of decreased activity level.  Will continue to benefit from further skilled PT to address balance, posture, gait and functional strength.   Pt will benefit from skilled therapeutic intervention in order to improve on the following deficits Abnormal gait;Decreased activity tolerance;Decreased balance;Decreased mobility;Decreased strength;Difficulty walking;Postural dysfunction   Rehab Potential Good  Pt is concerned about financial issues with copay.   PT Frequency 2x / week   PT Duration 4 weeks  plus eval-   PT Treatment/Interventions ADLs/Self Care Home Management;Therapeutic exercise;Therapeutic activities;Functional mobility training;Gait training;DME Instruction;Patient/family education;Neuromuscular re-education;Balance training   PT Next Visit Plan IMonitor BP during session if necessary; Review OTAGO program given (through [place] [facility]) and progress as able; walking program to be initiated   Consulted and Agree with Plan of Care Patient;Family member/caregiver   Family Member Consulted husband        Problem  List Patient Active Problem List   Diagnosis Date Noted  . Tinnitus 02/15/2015  . CKD stage 2 due to type 2 diabetes mellitus ([place]) 02/15/2015  . Chronic pain syndrome 06/15/2014  . Left knee pain 05/17/2014  . Urticaria, idiopathic 12/21/2013  . Cognitive impairment 12/11/2013  . Dysphagia, unspecified(787.20) 06/02/2013  . Parkinson's disease ([place]) 01/18/2013  . S/P TKR (total knee replacement) 06/09/2012  . Episodic substance abuse 09/20/2010  . Neoplasm of uncertain behavior 123XX123  . Esophageal abnormality 08/12/2010  . ARTHRITIS, BACK 03/28/2009  . SCOLIOSIS 03/28/2009  . ROTATOR CUFF SYNDROME 01/24/2009  . CARPAL TUNNEL SYNDROME, LEFT 01/05/2008  . Diabetes mellitus without complication ([place]) 99991111  . Hyperlipidemia 08/05/2006  . Essential hypertension 08/05/2006  . Osteoarthritis of multiple joints 08/04/2006  . Major depressive disorder, recurrent episode ([place]) 06/11/2006    Nicosha Struve W. 04/11/2015, 1:43 PM  Frazier Butt., PT [facility] [address] [place], [place], [address] Phone: [phone]   Fax:  [phone]  Name: [name] MRN: [id number] Date of Birth: 1945-06-13

## 2015-04-11 NOTE — Therapy (Signed)
[name] [address] [place], [place], [address] Phone: [phone]   Fax:  [phone]  Speech Language Pathology Treatment  Patient Details  Name: [name] MRN: [id number] Date of Birth: [birth year] No Data Recorded  Encounter Date: 04/11/2015      End of Session - 04/11/15 0844    Visit Number 2   Date for SLP Re-Evaluation 05/28/15   SLP Start Time 0804   SLP Stop Time  0846   SLP Time Calculation (min) 42 min   Activity Tolerance Patient tolerated treatment well      Past Medical History  Diagnosis Date  . Allergy   . Anxiety   . Hyperlipidemia   . Movement disorder   . Hypertension   . Scoliosis   . Esophageal stricture   . Cognitive impairment 12/11/2013  . Parkinson's disease ([place])   . Complication of anesthesia 07/2008    "hard to get me woke up when I had my knee replaced; they said they had to bring me back"  . Chronic bronchitis ([place])     "get it q yr"  . Type II diabetes mellitus ([place])   . GERD (gastroesophageal reflux disease)   . Arthritis     "knees, back" (06/29/2014)  . Osteoarthritis   . Chronic mid back pain   . History of gout 1970's  . Situational depression   . UTI (lower urinary tract infection)     Past Surgical History  Procedure Laterality Date  . 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 Esophagogastric fundoplasty      some type "esoph surgery" per pt  . Colon surgery    . Abdominal hysterectomy    . Cholecystectomy open    . Joint replacement    . Dilation and curettage of uterus    . Tubal ligation      There were no vitals filed for this visit.  Visit Diagnosis: Dysarthria      Subjective Assessment - 04/11/15 0808    Subjective "The biggest problem for me is that people can't understand me."               ADULT SLP TREATMENT - 04/11/15 0809    General Information   Behavior/Cognition Alert;Cooperative;Pleasant mood   Treatment  Provided   Treatment provided Cognitive-Linquistic   Pain Assessment   Pain Assessment 0-10   Pain Score 6    Pain Location lower left   Pain Descriptors / Indicators Sharp;Tender   Pain Intervention(s) Monitored during session   Cognitive-Linquistic Treatment   Treatment focused on Dysarthria   Skilled Treatment To facilitate pt's speech intelligibility SLP gave pt 10+ word sentences which pt read with 80% overarticulation. Pt demonstrated overarticulation in simple problem solving  tasks (Q and A) with 40% success. With SLP cues usually pt increased frequency of overarticulation.   Assessment / Recommendations / Plan   Plan Continue with current plan of care   Progression Toward Goals   Progression toward goals Progressing toward goals          SLP Education - 04/11/15 0844    Education provided Yes   Education Details overarticulation to improve intelligibility   Person(s) Educated Patient   Methods Explanation   Comprehension Verbalized understanding;Returned demonstration          SLP Short Term Goals - 04/11/15 0846    SLP SHORT TERM GOAL #1   Title pt will engage in 10 minutes conversation with intelligibility 95% for  two sessions   Time 4   Period Weeks   Status On-going   SLP SHORT TERM GOAL #2   Title pt will demo 20/20 sentences intelligible in mod complex tasks    Time 4   Period Weeks   Status On-going   SLP SHORT TERM GOAL #3   Title pt will tell SLP 3 signs aspiration PNA with rare min A   Time 4   Period Weeks   Status On-going          SLP Long Term Goals - 04/11/15 0847    SLP LONG TERM GOAL #1   Title pt will demo 15 minutes conversation (mod complex) with intelligibility >/=95% over three sessions   Time 8   Period Weeks   Status On-going   SLP LONG TERM GOAL #2   Title pt will demo mod complex multi-sentence responses with intelligibility >95% over two sessions   Time 8   Period Weeks   Status On-going          Plan - 04/11/15  0845    Clinical Impression Statement Pt cont to require skilled ST to improve speech intelligibility. She is able to read short sentences and exhibit good articulation however longer responses require SLP cues to overarticulate.   Speech Therapy Frequency 2x / week   Duration --  8 weeks   Treatment/Interventions Compensatory strategies;Internal/external aids;SLP instruction and feedback;Patient/family education;Functional tasks;Cueing hierarchy   Potential to Achieve Goals Fair   Potential Considerations Other (comment)  transportation to/from tx        Problem List Patient Active Problem List   Diagnosis Date Noted  . Tinnitus 02/15/2015  . CKD stage 2 due to type 2 diabetes mellitus ([place]) 02/15/2015  . Chronic pain syndrome 06/15/2014  . Left knee pain 05/17/2014  . Urticaria, idiopathic 12/21/2013  . Cognitive impairment 12/11/2013  . Dysphagia, unspecified(787.20) 06/02/2013  . Parkinson's disease ([place]) 01/18/2013  . S/P TKR (total knee replacement) 06/09/2012  . Episodic substance abuse 09/20/2010  . Neoplasm of uncertain behavior 123XX123  . Esophageal abnormality 08/12/2010  . ARTHRITIS, BACK 03/28/2009  . SCOLIOSIS 03/28/2009  . ROTATOR CUFF SYNDROME 01/24/2009  . CARPAL TUNNEL SYNDROME, LEFT 01/05/2008  . Diabetes mellitus without complication ([place]) 99991111  . Hyperlipidemia 08/05/2006  . Essential hypertension 08/05/2006  . Osteoarthritis of multiple joints 08/04/2006  . Major depressive disorder, recurrent episode ([place]) 06/11/2006    [facility] , [place], [place]  04/11/2015, 8:47 AM  [facility] [address] [place], [place], [address] Phone: [phone]   Fax:  [phone]   Name: [name] MRN: [id number] Date of Birth: May 18, 1945

## 2015-04-11 NOTE — Patient Instructions (Addendum)
1.  Contact your physician regarding your blood pressure concerns and report to her your lightheadedness and dizziness.  (Today's BP readings:  Lying down-148/75; standing 141/78)  2.  If you have been sitting for a long time, before you stand up, do several leg exercises (See the OTAGO program LAQ and Ankle pumps provided this visit)  3.  Once you stand up, before you start walking, make sure that you stand for 5-10 seconds until your dizziness has resolved.  You can shift your weight side to side, and then start walking.    Initiated OTAGO exercise handouts-from beginning of handouts stopping at [place] today -to be performed once daily until full set of exercises has been given and further instructions provided

## 2015-04-12 NOTE — Telephone Encounter (Signed)
Returned call.  Asked about nausea.  "It is gone now."  No meds needed.

## 2015-04-17 ENCOUNTER — Other Ambulatory Visit (HOSPITAL_COMMUNITY): Payer: Self-pay | Admitting: Psychiatry

## 2015-04-17 ENCOUNTER — Ambulatory Visit: Payer: Commercial Managed Care - HMO | Admitting: Diagnostic Neuroimaging

## 2015-04-18 ENCOUNTER — Ambulatory Visit: Payer: Commercial Managed Care - HMO | Attending: Family Medicine | Admitting: Physical Therapy

## 2015-04-18 ENCOUNTER — Ambulatory Visit: Payer: Commercial Managed Care - HMO | Admitting: Speech Pathology

## 2015-04-18 DIAGNOSIS — R293 Abnormal posture: Secondary | ICD-10-CM | POA: Insufficient documentation

## 2015-04-18 DIAGNOSIS — R471 Dysarthria and anarthria: Secondary | ICD-10-CM | POA: Insufficient documentation

## 2015-04-18 DIAGNOSIS — R269 Unspecified abnormalities of gait and mobility: Secondary | ICD-10-CM

## 2015-04-18 DIAGNOSIS — R258 Other abnormal involuntary movements: Secondary | ICD-10-CM | POA: Diagnosis not present

## 2015-04-18 DIAGNOSIS — R2681 Unsteadiness on feet: Secondary | ICD-10-CM | POA: Diagnosis not present

## 2015-04-18 NOTE — Patient Instructions (Signed)
Homework provided 

## 2015-04-18 NOTE — Therapy (Signed)
[facility] [address] [place], [place], [address] Phone: [phone]   Fax:  [phone]  Speech Language Pathology Treatment  Patient Details  Name: [name] MRN: [id number] Date of Birth: [birth year] No Data Recorded  Encounter Date: 04/18/2015      End of Session - 04/18/15 1057    Visit Number 3   Number of Visits 17   Date for SLP Re-Evaluation 05/28/15   Authorization Type [employer] - auth will be obtained   SLP Start Time 1018   SLP Stop Time  1057   SLP Time Calculation (min) 39 min      Past Medical History  Diagnosis Date  . Allergy   . Anxiety   . Hyperlipidemia   . Movement disorder   . Hypertension   . Scoliosis   . Esophageal stricture   . Cognitive impairment 12/11/2013  . Parkinson's disease ([place])   . Complication of anesthesia 07/2008    "hard to get me woke up when I had my knee replaced; they said they had to bring me back"  . Chronic bronchitis ([place])     "get it q yr"  . Type II diabetes mellitus ([place])   . GERD (gastroesophageal reflux disease)   . Arthritis     "knees, back" (06/29/2014)  . Osteoarthritis   . Chronic mid back pain   . History of gout 1970's  . Situational depression   . UTI (lower urinary tract infection)     Past Surgical History  Procedure Laterality Date  . 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 Esophagogastric fundoplasty      some type "esoph surgery" per pt  . Colon surgery    . Abdominal hysterectomy    . Cholecystectomy open    . Joint replacement    . Dilation and curettage of uterus    . Tubal ligation      There were no vitals filed for this visit.  Visit Diagnosis: Dysarthria      Subjective Assessment - 04/18/15 1024    Subjective "I'm reading aloud"               ADULT SLP TREATMENT - 04/18/15 1024    General Information   Behavior/Cognition Alert;Cooperative;Pleasant mood   Treatment Provided   Treatment provided  Cognitive-Linquistic   Pain Assessment   Pain Assessment 0-10   Pain Score 4    Pain Location back   Pain Descriptors / Indicators Aching;Constant   Pain Intervention(s) Monitored during session   Cognitive-Linquistic Treatment   Treatment focused on Dysarthria   Skilled Treatment Pt read rhyming sentences with overarticulation with rare min cues - slow rate with occasional min cues. Pt 95% intellgibile with reading task. Structured speech tasks with slow rate and overartciutlation with rare min cues 90% intelligibility. Simple conversation  with compensations with 90%intellgibility and occasional min cues to utilize slow rate.    Assessment / Recommendations / Plan   Plan Continue with current plan of care            SLP Short Term Goals - 04/18/15 1056    SLP SHORT TERM GOAL #1   Title pt will engage in 10 minutes conversation with intelligibility 95% for two sessions   Time 3   Period Weeks   Status On-going   SLP SHORT TERM GOAL #2   Title pt will demo 20/20 sentences intelligible in mod complex tasks    Time 3   Period Weeks  Status On-going   SLP SHORT TERM GOAL #3   Title pt will tell SLP 3 signs aspiration PNA with rare min A   Time 3   Period Weeks   Status On-going          SLP Long Term Goals - 04/18/15 1056    SLP LONG TERM GOAL #1   Title pt will demo 15 minutes conversation (mod complex) with intelligibility >/=95% over three sessions   Time 7   Period Weeks   Status On-going   SLP LONG TERM GOAL #2   Title pt will demo mod complex multi-sentence responses with intelligibility >95% over two sessions   Time 7   Period Weeks   Status On-going          Plan - 04/18/15 1055    Clinical Impression Statement Overartculation & slow rate in simple conversation continues to required occasional min cues. Continue skilled ST to maximize carryover of compensations for improved intellgibility.   Speech Therapy Frequency 2x / week   Treatment/Interventions  Compensatory strategies;Internal/external aids;SLP instruction and feedback;Patient/family education;Functional tasks;Cueing hierarchy   Potential to Achieve Goals Fair   Potential Considerations Other (comment)   Consulted and Agree with Plan of Care Patient        Problem List Patient Active Problem List   Diagnosis Date Noted  . Tinnitus 02/15/2015  . CKD stage 2 due to type 2 diabetes mellitus ([place]) 02/15/2015  . Chronic pain syndrome 06/15/2014  . Left knee pain 05/17/2014  . Urticaria, idiopathic 12/21/2013  . Cognitive impairment 12/11/2013  . Dysphagia, unspecified(787.20) 06/02/2013  . Parkinson's disease ([place]) 01/18/2013  . S/P TKR (total knee replacement) 06/09/2012  . Episodic substance abuse 09/20/2010  . Neoplasm of uncertain behavior 123XX123  . Esophageal abnormality 08/12/2010  . ARTHRITIS, BACK 03/28/2009  . SCOLIOSIS 03/28/2009  . ROTATOR CUFF SYNDROME 01/24/2009  . CARPAL TUNNEL SYNDROME, LEFT 01/05/2008  . Diabetes mellitus without complication ([place]) 99991111  . Hyperlipidemia 08/05/2006  . Essential hypertension 08/05/2006  . Osteoarthritis of multiple joints 08/04/2006  . Major depressive disorder, recurrent episode ([place]) 06/11/2006    Christie Copley, Annye Rusk MS, CCC-SLP 04/18/2015, 10:57 AM  [facility] [address] [place], [place], [address] Phone: [phone]   Fax:  [phone]   Name: [name] MRN: [id number] Date of Birth: [birth year]

## 2015-04-18 NOTE — Therapy (Signed)
[facility] [address] [place], [place], [address] Phone: [phone]   Fax:  [phone]  Physical Therapy Treatment  Patient Details  Name: [name] MRN: [id number] Date of Birth: [birth year] Referring Provider: Leta Baptist  Encounter Date: 04/18/2015      PT End of Session - 04/18/15 1711    Visit Number 4   Number of Visits 9   Date for PT Re-Evaluation 05/27/15   Authorization Type [employer] H[employer]-No aut required; no visit limit-GCODE every 10th visit   PT Start Time 0934   PT Stop Time 1013   PT Time Calculation (min) 39 min   Equipment Utilized During Treatment Gait belt   Activity Tolerance Patient tolerated treatment well   Behavior During Therapy [facility] for tasks assessed/performed      Past Medical History  Diagnosis Date  . Allergy   . Anxiety   . Hyperlipidemia   . Movement disorder   . Hypertension   . Scoliosis   . Esophageal stricture   . Cognitive impairment 12/11/2013  . Parkinson's disease ([place])   . Complication of anesthesia 07/2008    "hard to get me woke up when I had my knee replaced; they said they had to bring me back"  . Chronic bronchitis ([place])     "get it q yr"  . Type II diabetes mellitus ([place])   . GERD (gastroesophageal reflux disease)   . Arthritis     "knees, back" (06/29/2014)  . Osteoarthritis   . Chronic mid back pain   . History of gout 1970's  . Situational depression   . UTI (lower urinary tract infection)     Past Surgical History  Procedure Laterality Date  . 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 Esophagogastric fundoplasty      some type "esoph surgery" per pt  . Colon surgery    . Abdominal hysterectomy    . Cholecystectomy open    . Joint replacement    . Dilation and curettage of uterus    . Tubal ligation      There were no vitals filed for this visit.  Visit Diagnosis:  Abnormality of gait  Bradykinesia      Subjective Assessment -  04/18/15 0936    Subjective Not lightheaded or dizzy today; I do feel the tremors today.  Have not yet talked to my doctor-they are on vacation.   Currently in Pain? Yes   Pain Score 4    Pain Location Back   Pain Orientation Lower   Pain Descriptors / Indicators Aching   Pain Type Chronic pain  "there all the time"   Pain Onset More than a month ago   Pain Frequency Constant   Aggravating Factors  moving around and walking   Pain Relieving Factors medications, repositioning alleviate  PT will monitor, not treat as pain is chronic in nature                         [facility] Adult PT Treatment/Exercise - 04/18/15 0001    Ambulation/Gait   Ambulation/Gait Yes   Ambulation/Gait Assistance 5: Supervision;4: Min guard   Ambulation/Gait Assistance Details Manual cues provided at shoulders for relaxed, recipocal armswing; verbal cues provided for looking ahead at visual target and for "kicking" to improve foot clearance.   Ambulation Distance (Feet) 350 Feet   Assistive device None   Gait Pattern Step-through pattern;Decreased arm swing - right;Decreased arm swing - left;Decreased step  length - right;Decreased step length - left;Decreased trunk rotation   Ambulation Surface Level;Indoor   Gait Comments No c/o dizziness or lightheadedness today with gait.             Balance Exercises - 04/18/15 0938    OTAGO PROGRAM   Head Movements Sitting;5 reps   Neck Movements Sitting;5 reps   Back Extension Standing;5 reps   Trunk Movements Standing;5 reps  cues for use of visual targets   Ankle Movements Sitting;10 reps   Knee Extensor 10 reps   Knee Flexor 10 reps   Hip ABductor 10 reps   Ankle Plantorflexors 20 reps, support   Ankle Dorsiflexors 20 reps, support   Knee Bends 10 reps, support   Backwards Walking Support  4 reps along the counter   Walking and Turning Around No assistive device  Pt stops due to "dizziness"-"feel I'm going to fall"   Sideways Walking No  assistive device  4 lengths of counter    Overall OTAGO Comments Reviewed initial HEP-pt has not yet performed at home per report; pt return demo understanding with min cues.  Pt has difficulty with balance with backward walking and with figure-8 turns.  (Did not add those to HEP, but added others performed today.)  Pt requires frequent cueing to hold head up and look straight ahead at visual target, cues for upright posture during standing exercises.           PT Education - 04/18/15 1710    Education provided Yes   Education Details additions to HEP-see instructions; cues for walking including "kicking" for improved foot clearance, eyes on visual target for improved posture, and relaxed arm swing   Person(s) Educated Patient;Spouse   Methods Explanation;Demonstration;Handout   Comprehension Verbalized understanding;Returned demonstration             PT Long Term Goals - 03/29/15 0750    PT LONG TERM GOAL #1   Title Pt will be independent with HEP for improved functional mobiltiy and balance.  TARGET 04/27/15   Time 4   Period Weeks   Status New   PT LONG TERM GOAL #2   Title Pt will improve TUG score to less than or equal to 13.5 seconds for decreased fall risk.   Time 4   Period Weeks   Status New   PT LONG TERM GOAL #3   Title Pt will improve TUG cognitive to less than or equal to 15 seconds for improved dual tasking with gait.   Time 4   Period Weeks   Status New   PT LONG TERM GOAL #4   Title Pt will improve 5x sit<>stand to less than or equal to 15 seconds for improved transfer efficiency and safety.   Time 4   Period Weeks   Status New   PT LONG TERM GOAL #5   Title Pt will improve push and release test to 2 steps or less, in posterior direction, for improved balance recovery.   Time 4   Period Weeks   Status New   Additional Long Term Goals   Additional Long Term Goals Yes   PT LONG TERM GOAL #6   Title Pt will verbalize understanding of local Parkinson's  disease resources.   Time 4   Period Weeks   Status New   PT LONG TERM GOAL #7   Title Pt will verbalize understanding of fall prevention within home environment.   Period Weeks   Status New  Plan - 04/18/15 1714    Clinical Impression Statement Pt with less complaints of dizziness/lightheadedness today-when asked to describe more accurately, she reports "unsteadiness as if she might fall" with dynamic activities.  Plan to continue to address decreased balance and decreased gait independence/safety with further training.   Pt will benefit from skilled therapeutic intervention in order to improve on the following deficits Abnormal gait;Decreased activity tolerance;Decreased balance;Decreased mobility;Decreased strength;Difficulty walking;Postural dysfunction   Rehab Potential Good  Pt is concerned about financial issues with copay.   PT Frequency 2x / week   PT Duration 4 weeks  plus eval-   PT Treatment/Interventions ADLs/Self Care Home Management;Therapeutic exercise;Therapeutic activities;Functional mobility training;Gait training;DME Instruction;Patient/family education;Neuromuscular re-education;Balance training   PT Next Visit Plan Review OTAGO program through HEP given thus far and progress as able; initiate walking program; focus on upright posture and visual targets during standing exercises.   Consulted and Agree with Plan of Care Patient;Family member/caregiver   Family Member Consulted husband        Problem List Patient Active Problem List   Diagnosis Date Noted  . Tinnitus 02/15/2015  . CKD stage 2 due to type 2 diabetes mellitus ([place]) 02/15/2015  . Chronic pain syndrome 06/15/2014  . Left knee pain 05/17/2014  . Urticaria, idiopathic 12/21/2013  . Cognitive impairment 12/11/2013  . Dysphagia, unspecified(787.20) 06/02/2013  . Parkinson's disease ([place]) 01/18/2013  . S/P TKR (total knee replacement) 06/09/2012  . Episodic substance abuse  09/20/2010  . Neoplasm of uncertain behavior 123XX123  . Esophageal abnormality 08/12/2010  . ARTHRITIS, BACK 03/28/2009  . SCOLIOSIS 03/28/2009  . ROTATOR CUFF SYNDROME 01/24/2009  . CARPAL TUNNEL SYNDROME, LEFT 01/05/2008  . Diabetes mellitus without complication ([place]) 99991111  . Hyperlipidemia 08/05/2006  . Essential hypertension 08/05/2006  . Osteoarthritis of multiple joints 08/04/2006  . Major depressive disorder, recurrent episode ([place]) 06/11/2006    MARRIOTT,AMY W. 04/18/2015, 5:18 PM Frazier Butt., PT [facility] [address] [place], [place], [address] Phone: [phone]   Fax:  [phone]  Name: [name] MRN: [id number] Date of Birth: 21-Oct-1945

## 2015-04-18 NOTE — Patient Instructions (Signed)
Added through sideways walking on OTAGO program today for HEP (did not add backwards walking or figure-8 turns due to decreased balance)

## 2015-04-19 ENCOUNTER — Ambulatory Visit: Payer: Commercial Managed Care - HMO | Admitting: Speech Pathology

## 2015-04-19 ENCOUNTER — Ambulatory Visit: Payer: Commercial Managed Care - HMO | Admitting: Physical Therapy

## 2015-04-20 ENCOUNTER — Ambulatory Visit: Payer: Commercial Managed Care - HMO

## 2015-04-20 DIAGNOSIS — R269 Unspecified abnormalities of gait and mobility: Secondary | ICD-10-CM

## 2015-04-20 DIAGNOSIS — R2681 Unsteadiness on feet: Secondary | ICD-10-CM

## 2015-04-20 DIAGNOSIS — R293 Abnormal posture: Secondary | ICD-10-CM | POA: Diagnosis not present

## 2015-04-20 DIAGNOSIS — R471 Dysarthria and anarthria: Secondary | ICD-10-CM

## 2015-04-20 DIAGNOSIS — R258 Other abnormal involuntary movements: Secondary | ICD-10-CM | POA: Diagnosis not present
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
## 2015-04-20 NOTE — Therapy (Signed)
[facility] [address] [place], [place], [address] Phone: [phone]   Fax:  [phone]  Speech Language Pathology Treatment  Patient Details  Name: [name] MRN: [id number] Date of Birth: 1946/03/20 No Data Recorded  Encounter Date: 04/20/2015      End of Session - 04/20/15 1310    Visit Number 4   Number of Visits 17   Date for SLP Re-Evaluation 05/28/15   SLP Start Time 1103   SLP Stop Time  1146   SLP Time Calculation (min) 43 min   Activity Tolerance Patient tolerated treatment well      Past Medical History  Diagnosis Date  . Allergy   . Anxiety   . Hyperlipidemia   . Movement disorder   . Hypertension   . Scoliosis   . Esophageal stricture   . Cognitive impairment 12/11/2013  . Parkinson's disease ([place])   . Complication of anesthesia 07/2008    "hard to get me woke up when I had my knee replaced; they said they had to bring me back"  . Chronic bronchitis ([place])     "get it q yr"  . Type II diabetes mellitus ([place])   . GERD (gastroesophageal reflux disease)   . Arthritis     "knees, back" (06/29/2014)  . Osteoarthritis   . Chronic mid back pain   . History of gout 1970's  . Situational depression   . UTI (lower urinary tract infection)     Past Surgical History  Procedure Laterality Date  . 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 Esophagogastric fundoplasty      some type "esoph surgery" per pt  . Colon surgery    . Abdominal hysterectomy    . Cholecystectomy open    . Joint replacement    . Dilation and curettage of uterus    . Tubal ligation      There were no vitals filed for this visit.  Visit Diagnosis: Dysarthria             ADULT SLP TREATMENT - 04/20/15 1108    General Information   Behavior/Cognition Alert;Cooperative;Pleasant mood   Treatment Provided   Treatment provided Cognitive-Linquistic   Pain Assessment   Pain Assessment 0-10   Pain Score 5     Pain Location back   Pain Descriptors / Indicators Aching;Constant   Pain Intervention(s) Monitored during session   Cognitive-Linquistic Treatment   Treatment focused on Dysarthria   Skilled Treatment "My sister told me she could understand me," pt said with a smile. In structured tasks pt was 100% intelligible, as well as in 10 minutes conversation. Pt/husband both remarked that phone is difficult for pt - "I mumble," pt stated. So pt and SLP worked with pt overarticulation using the phone. Pt was 100% intelligible, and reported focus on overarticulation for most of that 10 minute conversation.   Assessment / Recommendations / Plan   Plan Continue with current plan of care   Progression Toward Goals   Progression toward goals Progressing toward goals            SLP Short Term Goals - 04/20/15 1312    SLP SHORT TERM GOAL #1   Title pt will engage in 10 minutes conversation with intelligibility 95% for two sessions   Baseline 04-20-15 - one session   Time 3   Period Weeks   Status On-going   SLP SHORT TERM GOAL #2   Title pt will demo 20/20 sentences  intelligible in mod complex tasks    Status Achieved   SLP SHORT TERM GOAL #3   Title pt will tell SLP 3 signs aspiration PNA with rare min A   Time 3   Period Weeks   Status On-going          SLP Long Term Goals - 04/20/15 1312    SLP LONG TERM GOAL #1   Title pt will demo 15 minutes conversation (mod complex) with intelligibility >/=95% over three sessions   Baseline 04-21-15 -one session   Time 7   Period Weeks   Status On-going   SLP LONG TERM GOAL #2   Title pt will demo mod complex multi-sentence responses with intelligibility >95% over two sessions   Time 7   Period Weeks   Status On-going          Plan - 04/20/15 1310    Clinical Impression Statement Overartculation & slow rate in simple conversation appears WNL at this time, with pt's concerted efforts at making speech more intelligible. On phone, pt also  100% intelligible. Continue skilled ST to maximize carryover of compensations for improved intellgibility. Pt may be appropriate for x1/week beginning next sesion.   Speech Therapy Frequency 2x / week   Duration --  7 weeks   Treatment/Interventions Compensatory strategies;Internal/external aids;SLP instruction and feedback;Patient/family education;Functional tasks;Cueing hierarchy   Potential to Achieve Goals Good   Consulted and Agree with Plan of Care Patient        Problem List Patient Active Problem List   Diagnosis Date Noted  . Tinnitus 02/15/2015  . CKD stage 2 due to type 2 diabetes mellitus ([place]) 02/15/2015  . Chronic pain syndrome 06/15/2014  . Left knee pain 05/17/2014  . Urticaria, idiopathic 12/21/2013  . Cognitive impairment 12/11/2013  . Dysphagia, unspecified(787.20) 06/02/2013  . Parkinson's disease ([place]) 01/18/2013  . S/P TKR (total knee replacement) 06/09/2012  . Episodic substance abuse 09/20/2010  . Neoplasm of uncertain behavior 123XX123  . Esophageal abnormality 08/12/2010  . ARTHRITIS, BACK 03/28/2009  . SCOLIOSIS 03/28/2009  . ROTATOR CUFF SYNDROME 01/24/2009  . CARPAL TUNNEL SYNDROME, LEFT 01/05/2008  . Diabetes mellitus without complication ([place]) 99991111  . Hyperlipidemia 08/05/2006  . Essential hypertension 08/05/2006  . Osteoarthritis of multiple joints 08/04/2006  . Major depressive disorder, recurrent episode ([place]) 06/11/2006    [facility] , [place], [place] 04/20/2015, 1:13 PM  [facility] [address] [place], [place], [address] Phone: [phone]   Fax:  [phone]   Name: [name] MRN: [id number] Date of Birth: [birth year]

## 2015-04-24 ENCOUNTER — Ambulatory Visit: Payer: Commercial Managed Care - HMO | Admitting: Speech Pathology

## 2015-04-24 ENCOUNTER — Ambulatory Visit: Payer: Commercial Managed Care - HMO | Admitting: Physical Therapy

## 2015-04-25 ENCOUNTER — Ambulatory Visit: Payer: Commercial Managed Care - HMO | Admitting: Physical Therapy

## 2015-04-25 ENCOUNTER — Other Ambulatory Visit: Payer: Self-pay | Admitting: Family Medicine

## 2015-04-25 DIAGNOSIS — R471 Dysarthria and anarthria: Secondary | ICD-10-CM | POA: Diagnosis not present

## 2015-04-25 DIAGNOSIS — R293 Abnormal posture: Secondary | ICD-10-CM | POA: Diagnosis not present

## 2015-04-25 DIAGNOSIS — R258 Other abnormal involuntary movements: Secondary | ICD-10-CM | POA: Diagnosis not present

## 2015-04-25 DIAGNOSIS — R269 Unspecified abnormalities of gait and mobility: Secondary | ICD-10-CM | POA: Diagnosis not present

## 2015-04-25 DIAGNOSIS — R2681 Unsteadiness on feet: Secondary | ICD-10-CM | POA: Diagnosis not present

## 2015-04-25 NOTE — Therapy (Signed)
[facility] [address] [place], [place], [address] Phone: [phone]   Fax:  [phone]  Physical Therapy Treatment  Patient Details  Name: [name] MRN: [id number] Date of Birth: Nov 29, 1945 Referring Provider: Leta Baptist  Encounter Date: 04/25/2015      PT End of Session - 04/25/15 1337    Visit Number 6   Number of Visits 14   Date for PT Re-Evaluation 05/27/15   Authorization Type [employer] H[employer]-No aut required; no visit limit-GCODE every 10th visit   PT Start Time 0847   PT Stop Time 0926   PT Time Calculation (min) 39 min   Activity Tolerance Patient tolerated treatment well   Behavior During Therapy [facility] for tasks assessed/performed      Past Medical History  Diagnosis Date  . Allergy   . Anxiety   . Hyperlipidemia   . Movement disorder   . Hypertension   . Scoliosis   . Esophageal stricture   . Cognitive impairment 12/11/2013  . Parkinson's disease ([place])   . Complication of anesthesia 07/2008    "hard to get me woke up when I had my knee replaced; they said they had to bring me back"  . Chronic bronchitis ([place])     "get it q yr"  . Type II diabetes mellitus ([place])   . GERD (gastroesophageal reflux disease)   . Arthritis     "knees, back" (06/29/2014)  . Osteoarthritis   . Chronic mid back pain   . History of gout 1970's  . Situational depression   . UTI (lower urinary tract infection)     Past Surgical History  Procedure Laterality Date  . 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 Esophagogastric fundoplasty      some type "esoph surgery" per pt  . Colon surgery    . Abdominal hysterectomy    . Cholecystectomy open    . Joint replacement    . Dilation and curettage of uterus    . Tubal ligation      There were no vitals filed for this visit.  Visit Diagnosis:  Abnormality of gait  Unsteadiness  Bradykinesia  Posture abnormality      Subjective Assessment - 04/25/15 0857     Subjective Pt reports this is the first time she's been out since the ice and snow.  No falls, no changes since last visit.   Currently in Pain? Yes   Pain Score 2    Pain Location Knee   Pain Orientation Right;Left   Pain Descriptors / Indicators Aching   Pain Type Chronic pain   Aggravating Factors  unsure what aggravates-arthritis   Pain Relieving Factors shots every 3 months alleviate pain                         [facility] Adult PT Treatment/Exercise - 04/25/15 0001    Transfers   Transfers Sit to Stand;Stand to Sit   Sit to Stand Without upper extremity assist;6: Modified independent (Device/Increase time);From chair/3-in-1   Five time sit to stand comments  14.74   Stand to Sit 6: Modified independent (Device/Increase time);Without upper extremity assist;To chair/3-in-1   Standardized Balance Assessment   Standardized Balance Assessment Timed Up and Go Test   Timed Up and Go Test   TUG Normal TUG;Cognitive TUG   Normal TUG (seconds) 12.94   Cognitive TUG (seconds) 10.75             Balance Exercises -  04/25/15 0901    OTAGO PROGRAM   Backwards Walking Support  4 reps   Tandem Stance 10 seconds, support  2 reps   Tandem Walk Support   One Leg Stand 10 seconds, support   Heel Walking Support  2 reps   Toe Walk Support  2 reps   Sit to Stand 10 reps, no support   Overall OTAGO Comments Pt demo understanding of HEP-does not report performing consistently.  Discussed frequency of OTAGO 3x/wk and walking program 3x/day 5+ minutes on days alternating HEP.                PT Long Term Goals - 04/25/15 0630    PT LONG TERM GOAL #1   Title Pt will be independent with HEP for improved functional mobiltiy and balance.  TARGET 04/27/15  NEW TARGET 05/26/15   Time 4   Period Weeks   Status On-going   PT LONG TERM GOAL #2   Title Pt will improve TUG score to less than or equal to 13.5 seconds for decreased fall risk.   Baseline TUG 12.94 sec    Time 4   Period Weeks   Status Achieved   PT LONG TERM GOAL #3   Title Pt will improve TUG cognitive to less than or equal to 15 seconds for improved dual tasking with gait.   Baseline TUG cog 10.75 sec   Time 4   Period Weeks   Status Achieved   PT LONG TERM GOAL #4   Title Pt will improve 5x sit<>stand to less than or equal to 15 seconds for improved transfer efficiency and safety.   Baseline 14.74 sec   Time 4   Period Weeks   Status Achieved   PT LONG TERM GOAL #5   Title Pt will improve push and release test to 2 steps or less, in posterior direction, for improved balance recovery.  NEW TARGET 05/26/15   Time 4   Period Weeks   Status On-going   Additional Long Term Goals   Additional Long Term Goals Yes   PT LONG TERM GOAL #6   Title Pt will verbalize understanding of local Parkinson's disease resources.  NEW TARGET 05/26/15   Time 4   Period Weeks   Status On-going   PT LONG TERM GOAL #7   Title Pt will verbalize understanding of fall prevention within home environment.  NEW TARGET  05/26/15   Time 4   Period Weeks   Status On-going   PT LONG TERM GOAL #8   Title Functional Gait Assessment to be assessed, with goal to be written as appropriate.  NEW TARGET 05/26/15   Time 4   Period Weeks   Status New               Plan - 04/25/15 1338    Clinical Impression Statement Pt has met LTG for improved TUG, TUG cognitive, and 5x sit<>stand.  Pt is having decreased lightheadedness and dizziness c/o and overall improved functional mobility.  She is currently performing OTAGO exercises for home, and she does voice interest in addtional Parkinson's disease exercises.  With patient's progress and continued imbalance with quick change of movement, pt would continue to benefit from further skilled PT to address balance, gait/functional mobility.  Pt's renewal to be completed  this visit.  Please note modified LTGs   Pt will benefit from skilled therapeutic intervention in order  to improve on the following deficits Abnormal gait;Decreased activity tolerance;Decreased balance;Decreased mobility;Decreased strength;Difficulty  walking;Postural dysfunction   PT Frequency 2x / week   PT Duration 4 weeks  additional 4 weeks-after wk of 04/27/15   PT Treatment/Interventions ADLs/Self Care Home Management;Therapeutic exercise;Therapeutic activities;Functional mobility training;Gait training;DME Instruction;Patient/family education;Neuromuscular re-education;Balance training   PT Next Visit Plan gait forintensity, posture exercises, try PWR! sit and stand as pt is interested in community ex class   PT [place] and Agree with Plan of Care Patient;Family member/caregiver   Family Member Consulted husband        Problem List Patient Active Problem List   Diagnosis Date Noted  . Tinnitus 02/15/2015  . CKD stage 2 due to type 2 diabetes mellitus ([place]) 02/15/2015  . Chronic pain syndrome 06/15/2014  . Left knee pain 05/17/2014  . Urticaria, idiopathic 12/21/2013  . Cognitive impairment 12/11/2013  . Dysphagia, unspecified(787.20) 06/02/2013  . Parkinson's disease ([place]) 01/18/2013  . S/P TKR (total knee replacement) 06/09/2012  . Episodic substance abuse 09/20/2010  . Neoplasm of uncertain behavior 41/63/8453  . Esophageal abnormality 08/12/2010  . ARTHRITIS, BACK 03/28/2009  . SCOLIOSIS 03/28/2009  . ROTATOR CUFF SYNDROME 01/24/2009  . CARPAL TUNNEL SYNDROME, LEFT 01/05/2008  . Diabetes mellitus without complication ([place]) 64/68/0321  . Hyperlipidemia 08/05/2006  . Essential hypertension 08/05/2006  . Osteoarthritis of multiple joints 08/04/2006  . Major depressive disorder, recurrent episode ([place]) 06/11/2006    MARRIOTT,AMY W. 04/25/2015, 2:00 PM  Frazier Butt., PT  [facility] [address] [place], [place], [address] Phone: [phone]   Fax:  [phone]  Name:  [name] MRN: [id number] Date of Birth: 06-07-1945

## 2015-04-25 NOTE — Addendum Note (Signed)
Addended by: Frazier Butt on: 04/25/2015 02:06 PM   Modules accepted: Orders

## 2015-04-26 ENCOUNTER — Ambulatory Visit: Payer: Commercial Managed Care - HMO | Admitting: Speech Pathology

## 2015-04-26 DIAGNOSIS — R471 Dysarthria and anarthria: Secondary | ICD-10-CM

## 2015-04-26 DIAGNOSIS — R293 Abnormal posture: Secondary | ICD-10-CM | POA: Diagnosis not present

## 2015-04-26 DIAGNOSIS — R269 Unspecified abnormalities of gait and mobility: Secondary | ICD-10-CM | POA: Diagnosis not present

## 2015-04-26 DIAGNOSIS — R258 Other abnormal involuntary movements: Secondary | ICD-10-CM | POA: Diagnosis not present

## 2015-04-26 DIAGNOSIS — R2681 Unsteadiness on feet: Secondary | ICD-10-CM | POA: Diagnosis not present

## 2015-04-26 NOTE — Patient Instructions (Signed)
Signs of Aspiration Pneumonia   . Chest pain/tightness . Fever (can be low grade) . Cough  o With foul-smelling phlegm (sputum) o With sputum containing pus or blood o With greenish sputum . Fatigue  . Shortness of breath  . Wheezing   **IF YOU HAVE THESE SIGNS, CONTACT YOUR DOCTOR OR GO TO THE EMERGENCY DEPARTMENT OR URGENT CARE AS SOON AS POSSIBLE**      

## 2015-04-26 NOTE — Therapy (Signed)
[facility] [address] [place], [place], [address] Phone: [phone]   Fax:  [phone]  Speech Language Pathology Treatment  Patient Details  Name: [name] MRN: [id number] Date of Birth: 1945/09/30 No Data Recorded  Encounter Date: 04/26/2015      End of Session - 04/26/15 1057    Visit Number 5   Number of Visits 17   Date for SLP Re-Evaluation 05/28/15   SLP Start Time 1018   SLP Stop Time  1057   SLP Time Calculation (min) 39 min      Past Medical History  Diagnosis Date  . Allergy   . Anxiety   . Hyperlipidemia   . Movement disorder   . Hypertension   . Scoliosis   . Esophageal stricture   . Cognitive impairment 12/11/2013  . Parkinson's disease ([place])   . Complication of anesthesia 07/2008    "hard to get me woke up when I had my knee replaced; they said they had to bring me back"  . Chronic bronchitis ([place])     "get it q yr"  . Type II diabetes mellitus ([place])   . GERD (gastroesophageal reflux disease)   . Arthritis     "knees, back" (06/29/2014)  . Osteoarthritis   . Chronic mid back pain   . History of gout 1970's  . Situational depression   . UTI (lower urinary tract infection)     Past Surgical History  Procedure Laterality Date  . 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 Esophagogastric fundoplasty      some type "esoph surgery" per pt  . Colon surgery    . Abdominal hysterectomy    . Cholecystectomy open    . Joint replacement    . Dilation and curettage of uterus    . Tubal ligation      There were no vitals filed for this visit.  Visit Diagnosis: Dysarthria      Subjective Assessment - 04/26/15 1025    Subjective "People aren't telling me they can't understand a word"               ADULT SLP TREATMENT - 04/26/15 1027    General Information   Behavior/Cognition Alert;Cooperative;Pleasant mood   Treatment Provided   Treatment provided  Cognitive-Linquistic   Pain Assessment   Pain Assessment 0-10   Pain Score 7    Pain Location back   Pain Descriptors / Indicators Aching;Sharp;Constant   Pain Intervention(s) Monitored during session   Cognitive-Linquistic Treatment   Treatment focused on Dysarthria   Skilled Treatment Pt. performed HEP for dysarthira  - multisyllabic words repeated then put in sentence with over articulation and slow rate with occasional min cues for slow rate.  Structured speech tasks for carryover of compensations with generating sentences with multiple meaning words with mod I. Simple conversation 100% intelligible. Pt reports success over the phone. Pt intellgible outside of therapy, walking around gym (noisy environment). Trained pt on s/s of aspiration with occasional min cues    Assessment / Recommendations / Plan   Plan Continue with current plan of care   Progression Toward Goals   Progression toward goals Progressing toward goals            SLP Short Term Goals - 04/26/15 1056    SLP SHORT TERM GOAL #1   Title pt will engage in 10 minutes conversation with intelligibility 95% for two sessions   Baseline 04-20-15 - one session  Time 3   Period Weeks   Status Achieved   SLP SHORT TERM GOAL #2   Title pt will demo 20/20 sentences intelligible in mod complex tasks    Status Achieved   SLP SHORT TERM GOAL #3   Title pt will tell SLP 3 signs aspiration PNA with rare min A   Time 3   Period Weeks   Status On-going          SLP Long Term Goals - 04/26/15 1056    SLP LONG TERM GOAL #1   Title pt will demo 15 minutes conversation (mod complex) with intelligibility >/=95% over three sessions   Baseline 04-21-15 -one session   Time 7   Period Weeks   Status On-going   SLP LONG TERM GOAL #2   Title pt will demo mod complex multi-sentence responses with intelligibility >95% over two sessions   Time 7   Period Weeks   Status Achieved          Plan - 04/26/15 1054    Clinical  Impression Statement Pt. making good progress with carryover of compensations with effort and mod I. Recommend pt decrease frequency to 1x a week, continue skilled ST to maximize carryover of intellgibility.   Speech Therapy Frequency 2x / week   Duration 1 week   Treatment/Interventions Compensatory strategies;Internal/external aids;SLP instruction and feedback;Patient/family education;Functional tasks;Cueing hierarchy   Potential to Achieve Goals Good   Potential Considerations Other (comment)   Consulted and Agree with Plan of Care Patient        Problem List Patient Active Problem List   Diagnosis Date Noted  . Tinnitus 02/15/2015  . CKD stage 2 due to type 2 diabetes mellitus ([place]) 02/15/2015  . Chronic pain syndrome 06/15/2014  . Left knee pain 05/17/2014  . Urticaria, idiopathic 12/21/2013  . Cognitive impairment 12/11/2013  . Dysphagia, unspecified(787.20) 06/02/2013  . Parkinson's disease ([place]) 01/18/2013  . S/P TKR (total knee replacement) 06/09/2012  . Episodic substance abuse 09/20/2010  . Neoplasm of uncertain behavior 123XX123  . Esophageal abnormality 08/12/2010  . ARTHRITIS, BACK 03/28/2009  . SCOLIOSIS 03/28/2009  . ROTATOR CUFF SYNDROME 01/24/2009  . CARPAL TUNNEL SYNDROME, LEFT 01/05/2008  . Diabetes mellitus without complication ([place]) 99991111  . Hyperlipidemia 08/05/2006  . Essential hypertension 08/05/2006  . Osteoarthritis of multiple joints 08/04/2006  . Major depressive disorder, recurrent episode ([place]) 06/11/2006    Joesphine Schemm, Annye Rusk MS, CCC-SLP 04/26/2015, 10:57 AM  [facility] [address] [place], [place], [address] Phone: [phone]   Fax:  [phone]   Name: [name] MRN: [id number] Date of Birth: 11/13/1945

## 2015-04-27 ENCOUNTER — Ambulatory Visit: Payer: Commercial Managed Care - HMO

## 2015-04-27 ENCOUNTER — Ambulatory Visit: Payer: Self-pay | Admitting: Physical Therapy

## 2015-04-27 NOTE — Telephone Encounter (Signed)
Dear [name] Team Please call in her tramadol refills for me [facility]! [name]

## 2015-04-30 NOTE — Telephone Encounter (Signed)
Rx called in per Dr. Nori Riis. Katharina Caper, Yula Crotwell D, CMA

## 2015-05-02 ENCOUNTER — Ambulatory Visit: Payer: Commercial Managed Care - HMO

## 2015-05-02 ENCOUNTER — Ambulatory Visit: Payer: Commercial Managed Care - HMO | Admitting: Physical Therapy

## 2015-05-02 VITALS — BP 132/83 | HR 51

## 2015-05-02 DIAGNOSIS — R258 Other abnormal involuntary movements: Secondary | ICD-10-CM | POA: Diagnosis not present

## 2015-05-02 DIAGNOSIS — R471 Dysarthria and anarthria: Secondary | ICD-10-CM | POA: Diagnosis not present

## 2015-05-02 DIAGNOSIS — R269 Unspecified abnormalities of gait and mobility: Secondary | ICD-10-CM

## 2015-05-02 DIAGNOSIS — R293 Abnormal posture: Secondary | ICD-10-CM | POA: Diagnosis not present

## 2015-05-02 DIAGNOSIS — R2681 Unsteadiness on feet: Secondary | ICD-10-CM | POA: Diagnosis not present
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
## 2015-05-02 NOTE — Therapy (Signed)
[facility] [address] [place], [place], [address] Phone: [phone]   Fax:  [phone]  Speech Language Pathology Treatment  Patient Details  Name: [name] MRN: [id number] Date of Birth: December 10, 1945 No Data Recorded  Encounter Date: 05/02/2015      End of Session - 05/02/15 0926    Visit Number 6   Number of Visits 17   Date for SLP Re-Evaluation 05/28/15   SLP Start Time 0855   SLP Stop Time  0930  late start due to SLP attendance at head/neck meeting   SLP Time Calculation (min) 35 min   Activity Tolerance Patient tolerated treatment well      Past Medical History  Diagnosis Date  . Allergy   . Anxiety   . Hyperlipidemia   . Movement disorder   . Hypertension   . Scoliosis   . Esophageal stricture   . Cognitive impairment 12/11/2013  . Parkinson's disease ([place])   . Complication of anesthesia 07/2008    "hard to get me woke up when I had my knee replaced; they said they had to bring me back"  . Chronic bronchitis ([place])     "get it q yr"  . Type II diabetes mellitus ([place])   . GERD (gastroesophageal reflux disease)   . Arthritis     "knees, back" (06/29/2014)  . Osteoarthritis   . Chronic mid back pain   . History of gout 1970's  . Situational depression   . UTI (lower urinary tract infection)     Past Surgical History  Procedure Laterality Date  . 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 Esophagogastric fundoplasty      some type "esoph surgery" per pt  . Colon surgery    . Abdominal hysterectomy    . Cholecystectomy open    . Joint replacement    . Dilation and curettage of uterus    . Tubal ligation      There were no vitals filed for this visit.  Visit Diagnosis: Dysarthria      Subjective Assessment - 05/02/15 0900    Subjective Pt reports sister commented positively on her speech. Pt reports sugar this AM was 154 - high for her.   Patient is accompained by: Family  member  husband               ADULT SLP TREATMENT - 05/02/15 0900    General Information   Behavior/Cognition Alert;Cooperative;Pleasant mood   Treatment Provided   Treatment provided Cognitive-Linquistic   Pain Assessment   Pain Assessment 0-10   Pain Score 3    Pain Location back   Pain Descriptors / Indicators Aching   Pain Intervention(s) Premedicated before session   Cognitive-Linquistic Treatment   Treatment focused on Dysarthria   Skilled Treatment Pt performed her HEP for dysarthria to habitualize compensations necessary to improve intelligibility.    Assessment / Recommendations / Plan   Plan Continue with current plan of care   Progression Toward Goals   Progression toward goals Progressing toward goals            SLP Short Term Goals - 05/02/15 0906    SLP SHORT TERM GOAL #1   Title pt will engage in 10 minutes conversation with intelligibility 95% for two sessions   Baseline 04-20-15 - one session   Status Achieved   SLP SHORT TERM GOAL #2   Title pt will demo 20/20 sentences intelligible in mod complex tasks  Status Achieved   SLP SHORT TERM GOAL #3   Title pt will tell SLP 3 signs aspiration PNA with rare min A   Status Achieved          SLP Long Term Goals - 05/02/15 [id number]    SLP LONG TERM GOAL #1   Title pt will demo 15 minutes conversation (mod complex) with intelligibility >/=95% over three sessions   Baseline 05-02-15 -two sessions   Time 6   Period Weeks   Status On-going   SLP LONG TERM GOAL #2   Title pt will demo mod complex multi-sentence responses with intelligibility >95% over two sessions   Status Achieved          Plan - 05/02/15 [id number]    Clinical Impression Statement Pt. making good progress with carryover of compensations with effort and mod I. Recommend if pt continues with progress as it is now, discharge likely in next 1-2 weeks.   Speech Therapy Frequency 1x /week   Duration --  5 weeks   Treatment/Interventions  Compensatory strategies;Internal/external aids;SLP instruction and feedback;Patient/family education;Functional tasks;Cueing hierarchy   Potential to Achieve Goals Good   Consulted and Agree with Plan of Care Patient        Problem List Patient Active Problem List   Diagnosis Date Noted  . Tinnitus 02/15/2015  . CKD stage 2 due to type 2 diabetes mellitus ([place]) 02/15/2015  . Chronic pain syndrome 06/15/2014  . Left knee pain 05/17/2014  . Urticaria, idiopathic 12/21/2013  . Cognitive impairment 12/11/2013  . Dysphagia, unspecified(787.20) 06/02/2013  . Parkinson's disease ([place]) 01/18/2013  . S/P TKR (total knee replacement) 06/09/2012  . Episodic substance abuse 09/20/2010  . Neoplasm of uncertain behavior 123XX123  . Esophageal abnormality 08/12/2010  . ARTHRITIS, BACK 03/28/2009  . SCOLIOSIS 03/28/2009  . ROTATOR CUFF SYNDROME 01/24/2009  . CARPAL TUNNEL SYNDROME, LEFT 01/05/2008  . Diabetes mellitus without complication ([place]) 99991111  . Hyperlipidemia 08/05/2006  . Essential hypertension 08/05/2006  . Osteoarthritis of multiple joints 08/04/2006  . Major depressive disorder, recurrent episode ([place]) 06/11/2006    [facility] , [place], [place]  05/02/2015, 9:30 AM  [facility] [address] [place], [place], [address] Phone: [phone]   Fax:  [phone]   Name: [name] MRN: [id number] Date of Birth: [birth year]

## 2015-05-02 NOTE — Patient Instructions (Signed)
Provided Seated PWR! Exercises x 10 reps of basic four moves once a day

## 2015-05-04 ENCOUNTER — Ambulatory Visit: Payer: Commercial Managed Care - HMO | Admitting: Physical Therapy

## 2015-05-07 ENCOUNTER — Ambulatory Visit: Payer: Commercial Managed Care - HMO | Admitting: Physical Therapy

## 2015-05-09 ENCOUNTER — Ambulatory Visit: Payer: Commercial Managed Care - HMO | Admitting: Physical Therapy

## 2015-05-09 ENCOUNTER — Encounter: Payer: Self-pay | Admitting: Physical Therapy

## 2015-05-09 VITALS — BP 120/79 | HR 53

## 2015-05-09 DIAGNOSIS — R269 Unspecified abnormalities of gait and mobility: Secondary | ICD-10-CM | POA: Diagnosis not present

## 2015-05-09 DIAGNOSIS — R2681 Unsteadiness on feet: Secondary | ICD-10-CM | POA: Diagnosis not present

## 2015-05-09 DIAGNOSIS — R258 Other abnormal involuntary movements: Secondary | ICD-10-CM | POA: Diagnosis not present

## 2015-05-09 DIAGNOSIS — R471 Dysarthria and anarthria: Secondary | ICD-10-CM | POA: Diagnosis not present

## 2015-05-09 DIAGNOSIS — R293 Abnormal posture: Secondary | ICD-10-CM
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
## 2015-05-11 ENCOUNTER — Ambulatory Visit: Payer: Commercial Managed Care - HMO | Admitting: Physical Therapy

## 2015-05-15 ENCOUNTER — Ambulatory Visit: Payer: Commercial Managed Care - HMO

## 2015-05-15 ENCOUNTER — Ambulatory Visit: Payer: Commercial Managed Care - HMO | Admitting: Physical Therapy

## 2015-05-15 DIAGNOSIS — R2681 Unsteadiness on feet: Secondary | ICD-10-CM

## 2015-05-15 DIAGNOSIS — R293 Abnormal posture: Secondary | ICD-10-CM

## 2015-05-15 DIAGNOSIS — R269 Unspecified abnormalities of gait and mobility: Secondary | ICD-10-CM

## 2015-05-15 DIAGNOSIS — R471 Dysarthria and anarthria: Secondary | ICD-10-CM | POA: Diagnosis not present

## 2015-05-15 DIAGNOSIS — R258 Other abnormal involuntary movements: Secondary | ICD-10-CM | POA: Diagnosis not present

## 2015-05-15 NOTE — Therapy (Signed)
[facility] [address] [place], [place], [address] Phone: [phone]   Fax:  [phone]  Speech Language Pathology Treatment  Patient Details  Name: [name] MRN: [id number] Date of Birth: 28-Jan-1946 No Data Recorded  Encounter Date: 05/15/2015      End of Session - 05/15/15 1121    Visit Number 7   Number of Visits 17   Date for SLP Re-Evaluation 05/28/15   SLP Start Time 1103   SLP Stop Time  1130  discharge today   SLP Time Calculation (min) 27 min      Past Medical History  Diagnosis Date  . Allergy   . Anxiety   . Hyperlipidemia   . Movement disorder   . Hypertension   . Scoliosis   . Esophageal stricture   . Cognitive impairment 12/11/2013  . Parkinson's disease ([place])   . Complication of anesthesia 07/2008    "hard to get me woke up when I had my knee replaced; they said they had to bring me back"  . Chronic bronchitis ([place])     "get it q yr"  . Type II diabetes mellitus ([place])   . GERD (gastroesophageal reflux disease)   . Arthritis     "knees, back" (06/29/2014)  . Osteoarthritis   . Chronic mid back pain   . History of gout 1970's  . Situational depression   . UTI (lower urinary tract infection)     Past Surgical History  Procedure Laterality Date  . 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 Esophagogastric fundoplasty      some type "esoph surgery" per pt  . Colon surgery    . Abdominal hysterectomy    . Cholecystectomy open    . Joint replacement    . Dilation and curettage of uterus    . Tubal ligation      There were no vitals filed for this visit.  Visit Diagnosis: Dysarthria      Subjective Assessment - 05/15/15 1108    Subjective Sugar was WNL this AM - reportedly 97. "I think I'm ready for it (discharge). I'm happy with my speech."                ADULT SLP TREATMENT - 05/15/15 1109    General Information   Behavior/Cognition  Alert;Cooperative;Pleasant mood   Treatment Provided   Treatment provided Cognitive-Linquistic   Pain Assessment   Pain Assessment 0-10   Pain Score 3    Pain Location back   Pain Descriptors / Indicators Aching   Pain Intervention(s) Monitored during session   Cognitive-Linquistic Treatment   Treatment focused on Dysarthria   Skilled Treatment Pt reports using the phone more often than before therapy due to improved clarity/intelligilbity of speech. "People aren't asking me to repeat myself all the time," pt stated. SLP and pt walked around clinic and also spoke in therapy room and pt maintained inteligiblity >97% for the entirety. Pt states she is ready for d/c.    Assessment / Recommendations / Plan   Plan Discharge SLP treatment due to (comment)  met goals   Progression Toward Goals   Progression toward goals Goals met, education completed, patient discharged from [facility] Education - 05/15/15 1138    Education provided Yes   Education Details told pt of free screenings in the future, after her d/c from PT   Person(s) Educated Patient   Methods Explanation  Comprehension Verbalized understanding          SLP Short Term Goals - 05/02/15 0906    SLP SHORT TERM GOAL #1   Title pt will engage in 10 minutes conversation with intelligibility 95% for two sessions   Baseline 04-20-15 - one session   Status Achieved   SLP SHORT TERM GOAL #2   Title pt will demo 20/20 sentences intelligible in mod complex tasks    Status Achieved   SLP SHORT TERM GOAL #3   Title pt will tell SLP 3 signs aspiration PNA with rare min A   Status Achieved          SLP Long Term Goals - June 01, 2015 1114    SLP LONG TERM GOAL #1   Title pt will demo 15 minutes conversation (mod complex) with intelligibility >/=95% over three sessions   Baseline 06/01/2015 -three sessions   Status Achieved   SLP LONG TERM GOAL #2   Title pt will demo mod complex multi-sentence responses with intelligibility  >95% over two sessions   Status Achieved          Plan - June 01, 2015 1126    Clinical Impression Statement Pt. is independent with use of compensations, maintained intelligibility 97%+ today in over 20 minutes conversation in and outside therapy room. Pt agrees with discharge today.   Treatment/Interventions Compensatory strategies;Internal/external aids;SLP instruction and feedback;Patient/family education;Functional tasks;Cueing hierarchy   Potential to Achieve Goals Good   Consulted and Agree with Plan of Care Patient     SPEECH THERAPY DISCHARGE SUMMARY  Visits from Start of Care: 7  Current functional level related to goals / functional outcomes: Pt met all STGs and LTGs. See above goal summary.  She is pleased with current progress, and reports people commenting positively on her speech clarity.   Remaining deficit: Minimal dysarthria, primarily from pt's oral tremors, but minimal impact on intelligibility.   Education / Equipment: Compensations for increased speech intelligibility.  Plan: Patient agrees to discharge.  Patient goals were met. Patient is being discharged due to meeting the stated rehab goals.  ?????            G-Codes - 06/01/15 1140    Functional Assessment Tool Used noms -6   Functional Limitations Motor speech   Motor Speech Goal Status [phone]) At least 1 percent but less than 20 percent impaired, limited or restricted   Motor Speech Goal Status (X9147) At least 1 percent but less than 20 percent impaired, limited or restricted      Problem List Patient Active Problem List   Diagnosis Date Noted  . Tinnitus 02/15/2015  . CKD stage 2 due to type 2 diabetes mellitus ([place]) 02/15/2015  . Chronic pain syndrome 06/15/2014  . Left knee pain 05/17/2014  . Urticaria, idiopathic 12/21/2013  . Cognitive impairment 12/11/2013  . Dysphagia, unspecified(787.20) 06/02/2013  . Parkinson's disease ([place]) 01/18/2013  . S/P TKR (total knee replacement)  06/09/2012  . Episodic substance abuse 09/20/2010  . Neoplasm of uncertain behavior 82/95/6213  . Esophageal abnormality 08/12/2010  . ARTHRITIS, BACK 03/28/2009  . SCOLIOSIS 03/28/2009  . ROTATOR CUFF SYNDROME 01/24/2009  . CARPAL TUNNEL SYNDROME, LEFT 01/05/2008  . Diabetes mellitus without complication ([place]) 08/65/7846  . Hyperlipidemia 08/05/2006  . Essential hypertension 08/05/2006  . Osteoarthritis of multiple joints 08/04/2006  . Major depressive disorder, recurrent episode ([place]) 06/11/2006    [facility] , [place], [place]  06-01-15, 11:42 AM  [facility] [address]  [place], [place], [address] Phone: [phone]   Fax:  [phone]   Name: [name] MRN: [id number] Date of Birth: March 12, 1946

## 2015-05-15 NOTE — Therapy (Signed)
[facility] [address] [place], [place], [address] Phone: [phone]   Fax:  [phone]  Physical Therapy Treatment  Patient Details  Name: [name] MRN: [id number] Date of Birth: [birth year] Referring Provider: Leta Baptist  Encounter Date: 05/15/2015      PT End of Session - 05/15/15 1934    Visit Number 9   Number of Visits 14   Date for PT Re-Evaluation 05/27/15   Authorization Type [employer] H[employer]-No aut required; no visit limit-GCODE every 10th visit   PT Start Time 1019   PT Stop Time 1059   PT Time Calculation (min) 40 min   Activity Tolerance Patient tolerated treatment well   Behavior During Therapy [facility] for tasks assessed/performed      Past Medical History  Diagnosis Date  . Allergy   . Anxiety   . Hyperlipidemia   . Movement disorder   . Hypertension   . Scoliosis   . Esophageal stricture   . Cognitive impairment 12/11/2013  . Parkinson's disease ([place])   . Complication of anesthesia 07/2008    "hard to get me woke up when I had my knee replaced; they said they had to bring me back"  . Chronic bronchitis ([place])     "get it q yr"  . Type II diabetes mellitus ([place])   . GERD (gastroesophageal reflux disease)   . Arthritis     "knees, back" (06/29/2014)  . Osteoarthritis   . Chronic mid back pain   . History of gout 1970's  . Situational depression   . UTI (lower urinary tract infection)     Past Surgical History  Procedure Laterality Date  . 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 Esophagogastric fundoplasty      some type "esoph surgery" per pt  . Colon surgery    . Abdominal hysterectomy    . Cholecystectomy open    . Joint replacement    . Dilation and curettage of uterus    . Tubal ligation      There were no vitals filed for this visit.  Visit Diagnosis:  Abnormality of gait  Unsteadiness  Posture abnormality  Bradykinesia      Subjective Assessment - 05/15/15 1021     Subjective Nothing new; no falls to report; just worried about son, who had eye surgery last week.   Currently in Pain? No/denies                         [facility] Adult PT Treatment/Exercise - 05/15/15 1928    Ambulation/Gait   Ambulation/Gait Yes   Ambulation/Gait Assistance 5: Supervision   Ambulation/Gait Assistance Details cues for posture, relaxed arm swing   Ambulation Distance (Feet) 800 Feet   Assistive device None   Gait Pattern Step-through pattern;Decreased arm swing - right;Decreased arm swing - left;Decreased step length - right;Decreased step length - left;Decreased trunk rotation   Ambulation Surface Level;Indoor   Gait Comments Gait with conversation tasks with obstacles in clinic with no loss of balance.  Pt does need cues for consistent reciprocal arm swing and upright posture/looking ahead.           PWR [facility]) - 05/15/15 1024    PWR! exercises Moves in sitting   PWR! Up 15  additional 5 reps with HEP handout   PWR! Rock 15  additional 5 reps with HEP handout   PWR! Twist 15   PWR! Step 15   Comments in sitting-pt  requires max cues for technique and sequence of exercises.  Pt has difficulty sequencing seated PWR! Twist and Step exercises.    Pt needs frequent redirection/breaks with exercises due to pt's reporting feeling uncertainty if she is doing exercises correctly.         PT Education - 05/15/15 1051    Education provided Yes   Education Details Review of seated PWR! Up and PWR! Rock-re-issued as part of HEP   Person(s) Educated Patient   Methods Explanation;Demonstration;Verbal cues;Handout   Comprehension Verbalized understanding;Returned demonstration;Need further instruction;Verbal cues required             PT Long Term Goals - 04/25/15 0921    PT LONG TERM GOAL #1   Title Pt will be independent with HEP for improved functional mobiltiy and balance.  TARGET 04/27/15  NEW TARGET 05/26/15   Time 4   Period Weeks    Status On-going   PT LONG TERM GOAL #2   Title Pt will improve TUG score to less than or equal to 13.5 seconds for decreased fall risk.   Baseline TUG 12.94 sec   Time 4   Period Weeks   Status Achieved   PT LONG TERM GOAL #3   Title Pt will improve TUG cognitive to less than or equal to 15 seconds for improved dual tasking with gait.   Baseline TUG cog 10.75 sec   Time 4   Period Weeks   Status Achieved   PT LONG TERM GOAL #4   Title Pt will improve 5x sit<>stand to less than or equal to 15 seconds for improved transfer efficiency and safety.   Baseline 14.74 sec   Time 4   Period Weeks   Status Achieved   PT LONG TERM GOAL #5   Title Pt will improve push and release test to 2 steps or less, in posterior direction, for improved balance recovery.  NEW TARGET 05/26/15   Time 4   Period Weeks   Status On-going   Additional Long Term Goals   Additional Long Term Goals Yes   PT LONG TERM GOAL #6   Title Pt will verbalize understanding of local Parkinson's disease resources.  NEW TARGET 05/26/15   Time 4   Period Weeks   Status On-going   PT LONG TERM GOAL #7   Title Pt will verbalize understanding of fall prevention within home environment.  NEW TARGET  05/26/15   Time 4   Period Weeks   Status On-going   PT LONG TERM GOAL #8   Title Functional Gait Assessment to be assessed, with goal to be written as appropriate.  NEW TARGET 05/26/15   Time 4   Period Weeks   Status New               Plan - 05/15/15 1934    Clinical Impression Statement Pt with no c/o dizziness today during session.  Pt has difficulty sequencing PWR! Twist and PWR! Step activities in sitting.  Pt interested in pursuing PWR! Classes in the future, but currently needs max cues for seated PWR! exercises; discussion was provided as to why each PWR! Move is important in daily functional activities.  Overall, pt's posture with gait appears to have improved.  Pt will continue to benefit from further skilled PT  to address balance, gait, transfers and functional mobility.   Pt will benefit from skilled therapeutic intervention in order to improve on the following deficits Abnormal gait;Decreased activity tolerance;Decreased balance;Decreased mobility;Decreased strength;Difficulty walking;Postural dysfunction  PT Frequency 2x / week   PT Duration 4 weeks  additional 4 weeks-after wk of 04/27/15   PT Treatment/Interventions ADLs/Self Care Home Management;Therapeutic exercise;Therapeutic activities;Functional mobility training;Gait training;DME Instruction;Patient/family education;Neuromuscular re-education;Balance training   PT Next Visit Plan [place] NEXT VISIT; give info on AHOY; Review seated PWR! Moves and try standing if able; continue gait activities   PT [place] and Agree with Plan of Care Patient        Problem List Patient Active Problem List   Diagnosis Date Noted  . Tinnitus 02/15/2015  . CKD stage 2 due to type 2 diabetes mellitus ([place]) 02/15/2015  . Chronic pain syndrome 06/15/2014  . Left knee pain 05/17/2014  . Urticaria, idiopathic 12/21/2013  . Cognitive impairment 12/11/2013  . Dysphagia, unspecified(787.20) 06/02/2013  . Parkinson's disease ([place]) 01/18/2013  . S/P TKR (total knee replacement) 06/09/2012  . Episodic substance abuse 09/20/2010  . Neoplasm of uncertain behavior [id number]  . Esophageal abnormality 08/12/2010  . ARTHRITIS, BACK 03/28/2009  . SCOLIOSIS 03/28/2009  . ROTATOR CUFF SYNDROME 01/24/2009  . CARPAL TUNNEL SYNDROME, LEFT 01/05/2008  . Diabetes mellitus without complication ([place]) 99991111  . Hyperlipidemia 08/05/2006  . Essential hypertension 08/05/2006  . Osteoarthritis of multiple joints 08/04/2006  . Major depressive disorder, recurrent episode ([place]) 06/11/2006    Shiela Bruns W. 05/15/2015, 7:44 PM Frazier Butt., PT [facility] [address] [place], [place], [address] Phone: [phone]   Fax:  [phone]  Name: [name] MRN: [id number] Date of Birth: 23-Jul-1945

## 2015-05-15 NOTE — Patient Instructions (Signed)
PT provided pt with PWR! Moves in sitting-PWR! Up and PWR! Rock -10 reps each side -once per day

## 2015-05-16 ENCOUNTER — Ambulatory Visit (INDEPENDENT_AMBULATORY_CARE_PROVIDER_SITE_OTHER): Payer: Commercial Managed Care - HMO | Admitting: Family Medicine

## 2015-05-16 ENCOUNTER — Other Ambulatory Visit: Payer: Self-pay | Admitting: Family Medicine

## 2015-05-16 ENCOUNTER — Encounter: Payer: Self-pay | Admitting: Family Medicine

## 2015-05-16 VITALS — BP 110/54 | HR 55 | Temp 97.4°F | Ht 64.0 in | Wt 175.5 lb

## 2015-05-16 DIAGNOSIS — M25562 Pain in left knee: Secondary | ICD-10-CM | POA: Diagnosis not present

## 2015-05-16 DIAGNOSIS — E119 Type 2 diabetes mellitus without complications: Secondary | ICD-10-CM | POA: Diagnosis not present

## 2015-05-16 DIAGNOSIS — E538 Deficiency of other specified B group vitamins: Secondary | ICD-10-CM

## 2015-05-16 DIAGNOSIS — I10 Essential (primary) hypertension: Secondary | ICD-10-CM

## 2015-05-16 LAB — COMPLETE METABOLIC PANEL WITH GFR
ALT: 12 U/L (ref 6–29)
AST: 21 U/L (ref 10–35)
Albumin: 4.1 g/dL (ref 3.6–5.1)
Alkaline Phosphatase: 75 U/L (ref 33–130)
BUN: 23 mg/dL (ref 7–25)
CO2: 21 mmol/L (ref 20–31)
Calcium: 9.4 mg/dL (ref 8.6–10.4)
Chloride: 105 mmol/L (ref 98–110)
Creat: 1.4 mg/dL — ABNORMAL HIGH (ref 0.60–0.93)
GFR, Est African American: 44 mL/min — ABNORMAL LOW (ref >=60)
GFR, Est Non African American: 38 mL/min — ABNORMAL LOW (ref >=60)
Glucose, Bld: 94 mg/dL (ref 65–99)
Potassium: 4.6 mmol/L (ref 3.5–5.3)
Sodium: 139 mmol/L (ref 135–146)
Total Bilirubin: 0.5 mg/dL (ref 0.2–1.2)
Total Protein: 7 g/dL (ref 6.1–8.1)

## 2015-05-16 LAB — POCT GLYCOSYLATED HEMOGLOBIN (HGB A1C): Hemoglobin A1C: 6

## 2015-05-16 MED ORDER — OLMESARTAN MEDOXOMIL 5 MG PO TABS: 5.0000 mg | ORAL_TABLET | Freq: Every day | ORAL | Status: DC

## 2015-05-16 MED ORDER — CYANOCOBALAMIN 1000 MCG/ML IJ SOLN
1000.0000 ug | Freq: Once | INTRAMUSCULAR | Status: AC
  Administered 2015-05-16: 1000 ug via INTRAMUSCULAR

## 2015-05-16 NOTE — Patient Instructions (Addendum)
I can tell you have had a LOT of improvement in your speech---YAY!!! I have called in a RX of the olmasartan 5 mg a day. Use that instead of the 40 mg tab and see me in about a month.

## 2015-05-17 ENCOUNTER — Ambulatory Visit: Payer: Commercial Managed Care - HMO | Attending: Family Medicine | Admitting: Physical Therapy

## 2015-05-17 ENCOUNTER — Encounter: Payer: Self-pay | Admitting: Physical Therapy

## 2015-05-17 DIAGNOSIS — R258 Other abnormal involuntary movements: Secondary | ICD-10-CM | POA: Insufficient documentation

## 2015-05-17 DIAGNOSIS — R471 Dysarthria and anarthria: Secondary | ICD-10-CM | POA: Insufficient documentation

## 2015-05-17 DIAGNOSIS — R2681 Unsteadiness on feet: Secondary | ICD-10-CM | POA: Diagnosis not present

## 2015-05-17 DIAGNOSIS — R269 Unspecified abnormalities of gait and mobility: Secondary | ICD-10-CM | POA: Diagnosis not present

## 2015-05-17 DIAGNOSIS — R293 Abnormal posture: Secondary | ICD-10-CM | POA: Insufficient documentation

## 2015-05-17 NOTE — Assessment & Plan Note (Signed)
Reviewed her old x-rays which again show tricompartmental osteoarthritis. Despite her great success with the right knee, she doesn't want to pursue left knee replacement this point in her life because she's got some much else going on. We did a corticosteroid injection today. Given her history of problems with this use of opioids, I do not feel comfortable treating her with those for her knee pain.

## 2015-05-17 NOTE — Progress Notes (Signed)
   Subjective:    Patient ID: [name], female    DOB: 1946/03/02, 70 y.o.   MRN: [id number]  HPI #1. Follow-up diabetes mellitus. She's not been checking her blood sugars but she's been taking her medicines pretty regularly. She's had no episodes of low blood sugar, no increased thirst, no problems with urination frequency. #2. Follow-up dysarthria. She has completed her outpatient speech therapy and is quite excited about her improvement area  #3. Knee pain. Her left knee is really bothering her she like to go ahead and get a corticosteroid injection today   Review of Systems Denies fever, denies unusual weight change.    Objective:   Physical Exam  Vital signs are reviewed GEN.: Well-developed female no acute distress SPEECH: Her dysarthria significantly improved, I can understand her much better KNEE: LEFT: Tender to palpation medial joint line. External changes of osteoarthritis was synovitis. Ligamentously intact to varus and valgus stress. The calf is soft. The popliteal spaces benign. Distally she is neurovascular intact INJECTION: Patient was given informed consent, signed copy in the chart. Appropriate time out was taken. Area prepped and draped in usual sterile fashion. 1 cc of methylprednisolone 40 mg/ml plus  4 cc of 1% lidocaine without epinephrine was injected into the left knee using a(n) anterior medial approach. The patient tolerated the procedure well. There were no complications. Post procedure instructions were given.       Assessment & Plan:
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
## 2015-05-21 ENCOUNTER — Encounter: Payer: Self-pay | Admitting: Family Medicine

## 2015-05-21 DIAGNOSIS — N183 Chronic kidney disease, stage 3 unspecified: Secondary | ICD-10-CM

## 2015-05-21 DIAGNOSIS — E1122 Type 2 diabetes mellitus with diabetic chronic kidney disease: Secondary | ICD-10-CM

## 2015-05-22 ENCOUNTER — Ambulatory Visit: Payer: Commercial Managed Care - HMO | Admitting: Physical Therapy

## 2015-05-24 ENCOUNTER — Encounter: Payer: Self-pay | Admitting: Physical Therapy

## 2015-05-24 ENCOUNTER — Ambulatory Visit: Payer: Commercial Managed Care - HMO | Admitting: Physical Therapy

## 2015-05-24 ENCOUNTER — Telehealth: Payer: Self-pay | Admitting: Diagnostic Neuroimaging

## 2015-05-24 DIAGNOSIS — R293 Abnormal posture: Secondary | ICD-10-CM | POA: Diagnosis not present

## 2015-05-24 DIAGNOSIS — R471 Dysarthria and anarthria: Secondary | ICD-10-CM | POA: Diagnosis not present

## 2015-05-24 DIAGNOSIS — R258 Other abnormal involuntary movements: Secondary | ICD-10-CM | POA: Diagnosis not present

## 2015-05-24 DIAGNOSIS — R2681 Unsteadiness on feet: Secondary | ICD-10-CM | POA: Diagnosis not present

## 2015-05-24 DIAGNOSIS — R269 Unspecified abnormalities of gait and mobility: Secondary | ICD-10-CM

## 2015-05-24 NOTE — Telephone Encounter (Signed)
Patient requesting letter for employer stating that she completed Speech therapy and Physical Therapy. Best number to call is [phone]

## 2015-05-24 NOTE — Telephone Encounter (Signed)
Spoke with patient and informed her that she still had PT, ST appointments scheduled. She stated she was released today by Warren Lacy and Glendell Docker who stated she had met all her goals. She is requesting letter be faxed to [employer], and she is requesting the letter also state she can continue to care for her son in her home. Informed her would wait until notes from Amy and Glendell Docker are in [place], then will write letter. She verbalized understanding, appreciation.

## 2015-05-25 ENCOUNTER — Encounter: Payer: Self-pay | Admitting: *Deleted

## 2015-05-25 ENCOUNTER — Telehealth: Payer: Self-pay | Admitting: *Deleted

## 2015-05-25 DIAGNOSIS — F3341 Major depressive disorder, recurrent, in partial remission: Secondary | ICD-10-CM | POA: Diagnosis not present
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
## 2015-05-25 NOTE — Telephone Encounter (Signed)
Letter written, reviewed and signed by Dr Leta Baptist as patient requested. Faxed to [facility], [facility] [employer] as patient requested.  Patient is aware.

## 2015-05-25 NOTE — Telephone Encounter (Signed)
I will be completing her discharge summary today.  It should be available in EPIC upon completion.  I believe that Dr. Leta Baptist will need to comment on her return to work status.  Thank you.  Mady Haagensen, PT

## 2015-05-29 ENCOUNTER — Ambulatory Visit: Payer: Self-pay | Admitting: Physical Therapy

## 2015-05-31 ENCOUNTER — Ambulatory Visit: Payer: Self-pay | Admitting: Physical Therapy

## 2015-06-01 ENCOUNTER — Other Ambulatory Visit: Payer: Self-pay | Admitting: Family Medicine

## 2015-06-01 NOTE — Telephone Encounter (Signed)
Has been vomiting for 3 days.  Needs something to stop it so she will not end up in hospital [facility] on  [facility]

## 2015-06-04 NOTE — Telephone Encounter (Signed)
Left voice message for patient stating that she will need to schedule an appointment per Dr. Nori Riis.  Derl Barrow, RN

## 2015-06-04 NOTE — Telephone Encounter (Signed)
Dear [name] Team If she is still vomiting, she needs to be seen here on [facility]! [name]

## 2015-07-26 ENCOUNTER — Other Ambulatory Visit: Payer: Self-pay | Admitting: Family Medicine

## 2015-07-26 DIAGNOSIS — E119 Type 2 diabetes mellitus without complications: Secondary | ICD-10-CM

## 2015-07-30 NOTE — Telephone Encounter (Signed)
Dear [name] Team Please let herknow that I am NOT refilling her metformin at the 850 mg dose. I AM givingher a rx for 250 mg dose--I wanther to change to lower dose. Her last kidney function had crept up a bit and we need to decrease metformin. I also want her to come for a lab only in about 3 weeks (orders are in--no fasting needed)  And see me in about 4-6 weeks.

## 2015-07-31 NOTE — Telephone Encounter (Signed)
LVM for pt to call back to inform her of below. Zimmerman Rumple, April D, CMA  

## 2015-08-02 ENCOUNTER — Other Ambulatory Visit: Payer: Self-pay | Admitting: Family Medicine

## 2015-08-08 ENCOUNTER — Encounter: Payer: Self-pay | Admitting: Family Medicine

## 2015-08-08 ENCOUNTER — Ambulatory Visit (INDEPENDENT_AMBULATORY_CARE_PROVIDER_SITE_OTHER): Payer: Commercial Managed Care - HMO | Admitting: Family Medicine

## 2015-08-08 ENCOUNTER — Ambulatory Visit
Admission: RE | Admit: 2015-08-08 | Discharge: 2015-08-08 | Disposition: A | Discharge status: Home / Self Care | Payer: Commercial Managed Care - HMO | Source: Ambulatory Visit | Attending: Family Medicine | Admitting: Family Medicine

## 2015-08-08 VITALS — BP 136/83 | HR 113 | Temp 97.9°F | Ht 64.0 in | Wt 183.7 lb

## 2015-08-08 DIAGNOSIS — S0003XA Contusion of scalp, initial encounter: Secondary | ICD-10-CM

## 2015-08-08 DIAGNOSIS — S1093XA Contusion of unspecified part of neck, initial encounter: Principal | ICD-10-CM

## 2015-08-08 DIAGNOSIS — S0083XA Contusion of other part of head, initial encounter: Secondary | ICD-10-CM | POA: Diagnosis not present

## 2015-08-08 DIAGNOSIS — N183 Chronic kidney disease, stage 3 unspecified: Secondary | ICD-10-CM

## 2015-08-08 DIAGNOSIS — E1122 Type 2 diabetes mellitus with diabetic chronic kidney disease: Secondary | ICD-10-CM | POA: Diagnosis not present

## 2015-08-08 DIAGNOSIS — E119 Type 2 diabetes mellitus without complications: Secondary | ICD-10-CM

## 2015-08-08 DIAGNOSIS — G20 Parkinson's disease: Secondary | ICD-10-CM

## 2015-08-08 DIAGNOSIS — S0512XA Contusion of eyeball and orbital tissues, left eye, initial encounter: Secondary | ICD-10-CM | POA: Diagnosis not present

## 2015-08-08 DIAGNOSIS — S199XXA Unspecified injury of neck, initial encounter: Secondary | ICD-10-CM | POA: Diagnosis not present

## 2015-08-08 LAB — BASIC METABOLIC PANEL WITH GFR
BUN: 23 mg/dL (ref 7–25)
CO2: 25 mmol/L (ref 20–31)
Calcium: 9.8 mg/dL (ref 8.6–10.4)
Chloride: 102 mmol/L (ref 98–110)
Creat: 1.16 mg/dL — ABNORMAL HIGH (ref 0.60–0.93)
GFR, Est African American: 55 mL/min — ABNORMAL LOW (ref >=60)
GFR, Est Non African American: 48 mL/min — ABNORMAL LOW (ref >=60)
Glucose, Bld: 115 mg/dL — ABNORMAL HIGH (ref 65–99)
Potassium: 4.1 mmol/L (ref 3.5–5.3)
Sodium: 139 mmol/L (ref 135–146)

## 2015-08-08 LAB — POCT GLYCOSYLATED HEMOGLOBIN (HGB A1C): Hemoglobin A1C: 6

## 2015-08-08 IMAGING — CR DG CHEST 2V
3 series · 3 of 3 positions shown · non-contrast
Comparison: 12/10/2013

CLINICAL DATA: Diarrhea, altered mental status

EXAM:
CHEST  2 VIEW

[w chest lat (1 of 2)]
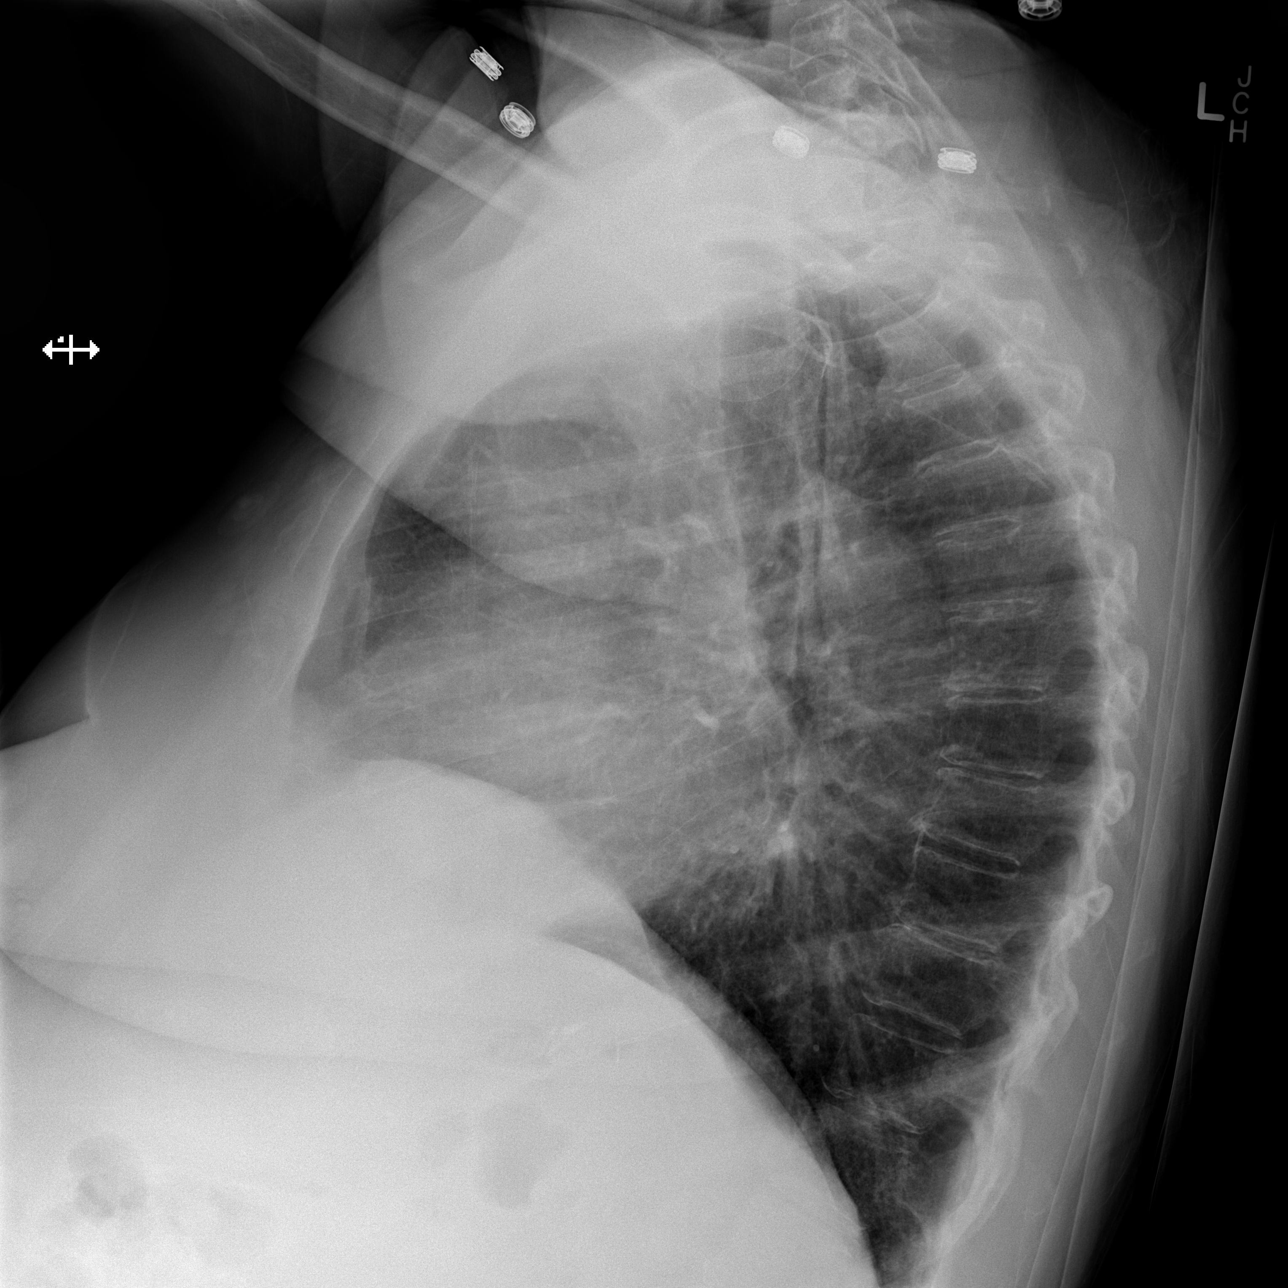

[w chest lat (2 of 2)]
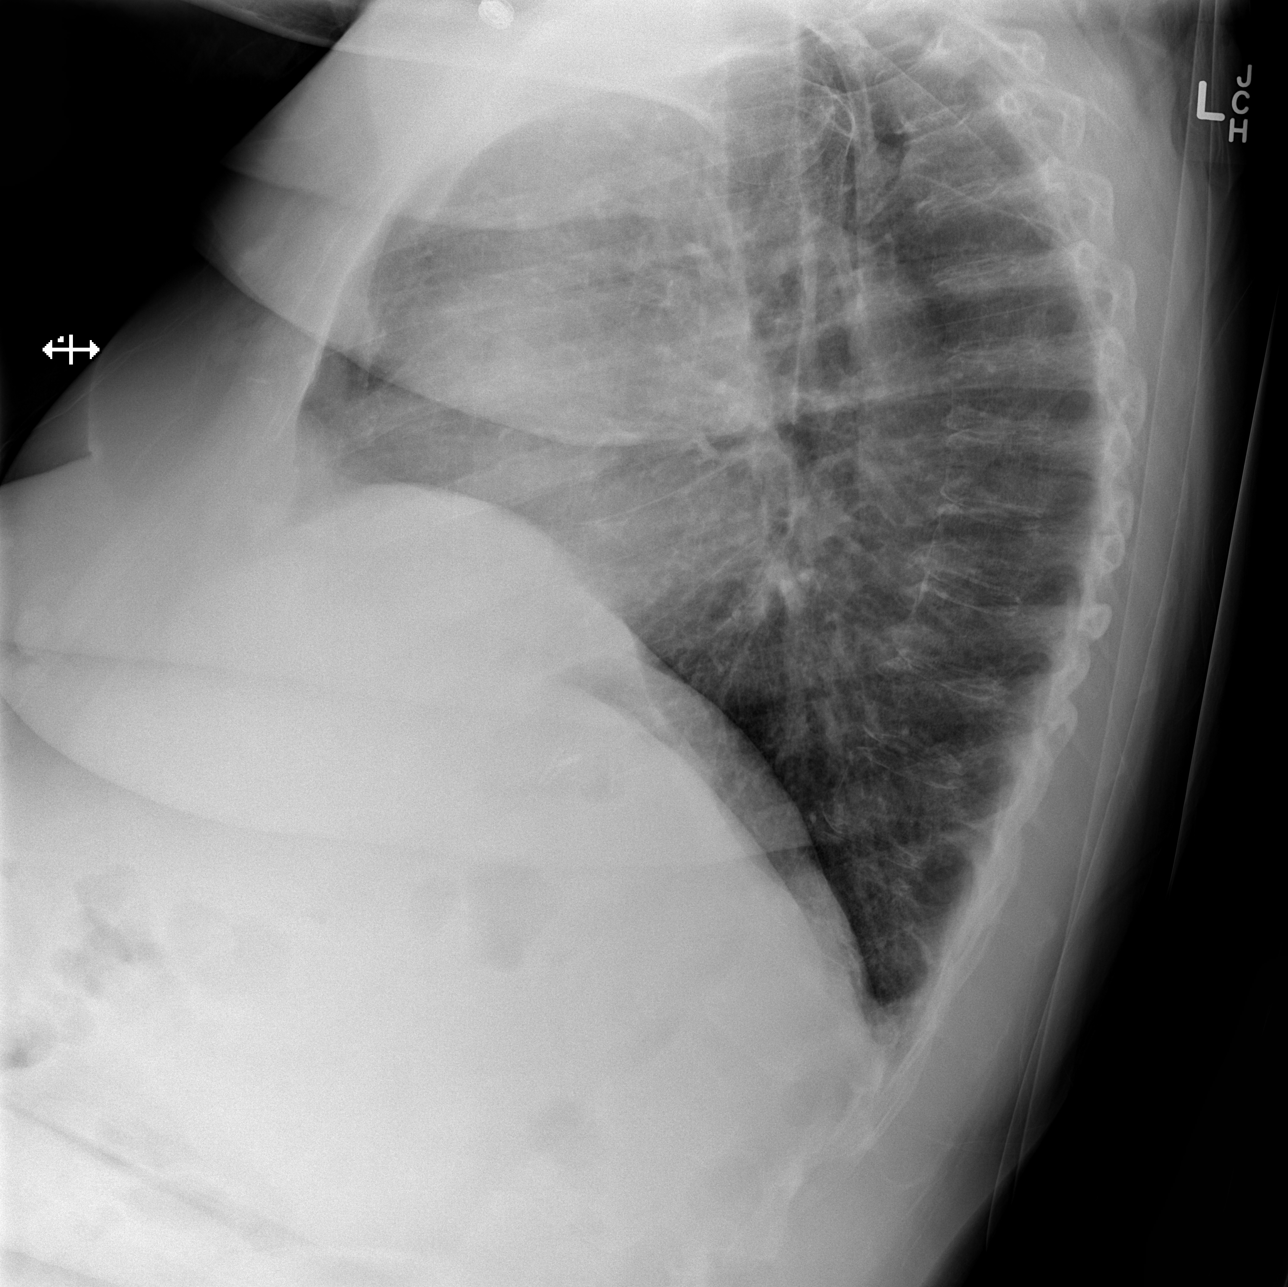

[x chest ap]
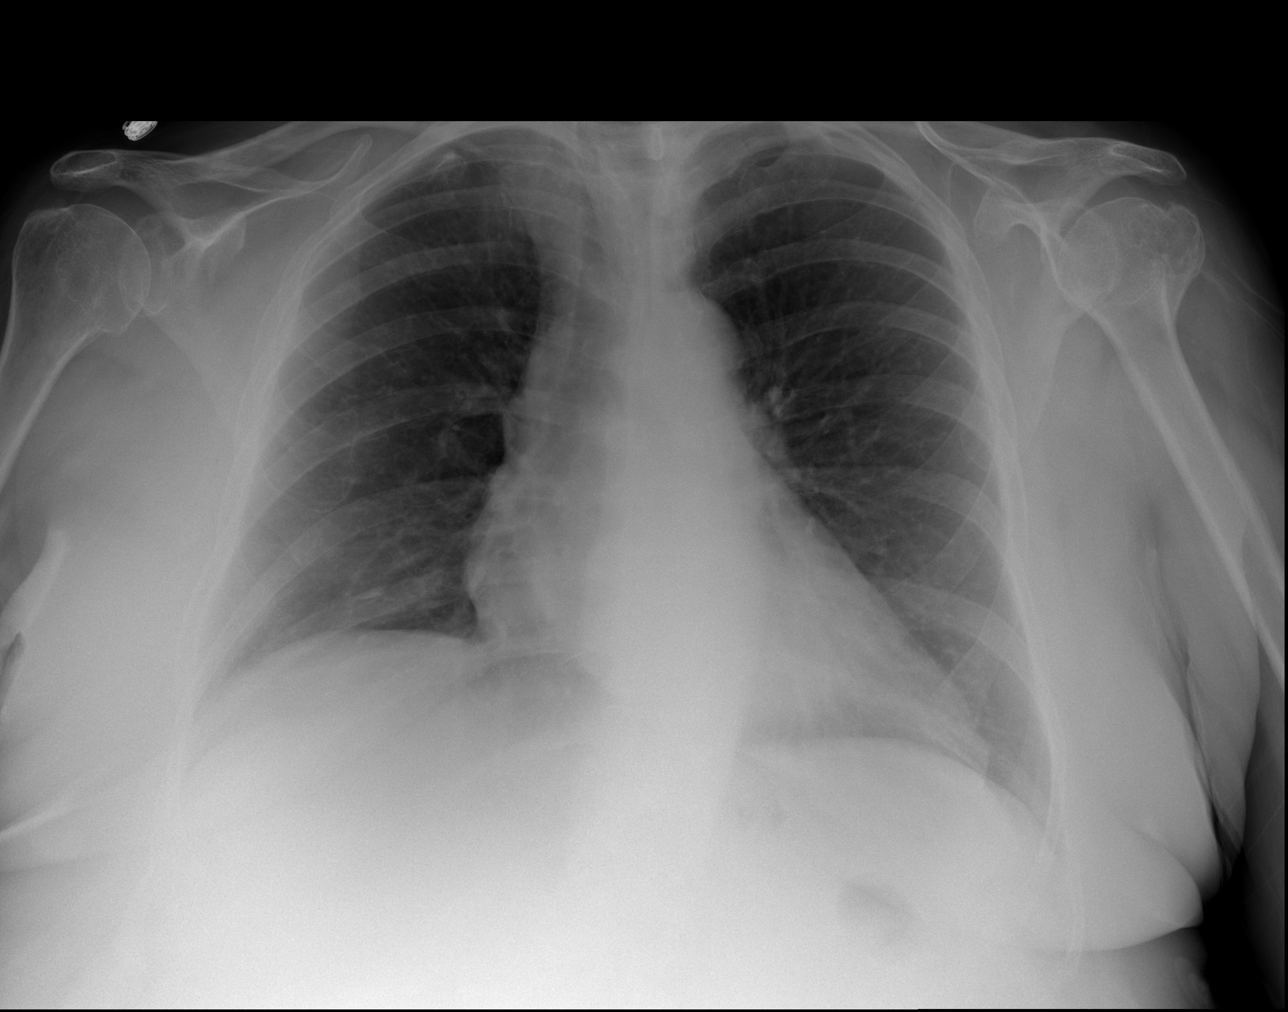

[3 of 3 positions shown; findings below may reference images not displayed]

FINDINGS: There is mild elevation of the right diaphragm. There is no focal
parenchymal opacity, pleural effusion, or pneumothorax. The heart
and mediastinal contours are unremarkable.

The osseous structures are unremarkable.
IMPRESSION: No active cardiopulmonary disease.

## 2015-08-08 MED ORDER — TRAMADOL HCL 50 MG PO TABS: ORAL_TABLET | ORAL | Status: DC

## 2015-08-08 MED ORDER — METFORMIN HCL 500 MG PO TABS: 500.0000 mg | ORAL_TABLET | Freq: Two times a day (BID) | ORAL | Status: DC

## 2015-08-10 NOTE — Assessment & Plan Note (Signed)
Recheck creatinine today 
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
## 2015-08-10 NOTE — Assessment & Plan Note (Signed)
She has really great control right now and her GFR is on the borderline so we will decrease her metformin dose to 5 her milligrams daily. I'll follow her up in 3 months with recheck of A1c. We'll check her creatinine today and then again in 3 months.

## 2015-08-10 NOTE — Assessment & Plan Note (Signed)
She is fairly unsteady on her feet and I think this is partly responsible for her fall. She continues follow up with neurologist.

## 2015-08-13 ENCOUNTER — Ambulatory Visit: Payer: Commercial Managed Care - HMO | Admitting: Diagnostic Neuroimaging

## 2015-08-14 ENCOUNTER — Encounter: Payer: Self-pay | Admitting: Diagnostic Neuroimaging

## 2015-08-15 ENCOUNTER — Encounter: Payer: Self-pay | Admitting: Family Medicine

## 2015-08-30 ENCOUNTER — Telehealth: Payer: Self-pay | Admitting: *Deleted

## 2015-08-30 NOTE — Telephone Encounter (Signed)
Called patient about note indicating she had cancelled her follow up next Thurs. She stated she had not. This RN requested to change it due to a conflict in dr's schedule; she agreed.  She then gave phone to her husband; rescheduled her follow up for 09/03/15, 3 pm, advised she arrive 15 min early to check.  Her husband verbalized understanding.

## 2015-09-03 ENCOUNTER — Ambulatory Visit (INDEPENDENT_AMBULATORY_CARE_PROVIDER_SITE_OTHER): Payer: Commercial Managed Care - HMO | Admitting: Diagnostic Neuroimaging

## 2015-09-03 ENCOUNTER — Encounter: Payer: Self-pay | Admitting: Diagnostic Neuroimaging

## 2015-09-03 VITALS — BP 140/94 | HR 102 | Ht 64.0 in | Wt 190.4 lb

## 2015-09-03 DIAGNOSIS — R279 Unspecified lack of coordination: Secondary | ICD-10-CM | POA: Diagnosis not present

## 2015-09-03 DIAGNOSIS — G20 Parkinson's disease: Secondary | ICD-10-CM | POA: Diagnosis not present

## 2015-09-03 DIAGNOSIS — R269 Unspecified abnormalities of gait and mobility: Secondary | ICD-10-CM

## 2015-09-03 DIAGNOSIS — G249 Dystonia, unspecified: Secondary | ICD-10-CM

## 2015-09-03 MED ORDER — CARBIDOPA-LEVODOPA 25-100 MG PO TABS: 1.5000 | ORAL_TABLET | Freq: Three times a day (TID) | ORAL | Status: DC

## 2015-09-03 NOTE — Progress Notes (Signed)
[facility]  PATIENT: [name] DOB: 24-Nov-1945  REFERRING CLINICIAN:  HISTORY FROM: patient and daughter REASON FOR VISIT: follow up   HISTORICAL  CHIEF COMPLAINT:  Chief Complaint  Patient presents with  . Parkinson's disease    rm 5, dgtrSharyn Young, grand dgter- [name], "no real changes, try to do PT at home 3 days a week, was better when taking PT here"  . Follow-up    6 month    HISTORY OF PRESENT ILLNESS:   UPDATE 09/03/15: Since last visit, stable. Did well with PT. Doing well with lower dose of carb/levo.   UPDATE 02/13/15: Since last visit, continues to have tremor, tongue and mouth movments (better), sweating, anxiety, inverted sleep schedule.   UPDATE 10/09/14: Since last visit, had increased carb/levo up to 2.5 tabs TID (even though I had recommended going up to 2tabs TID; she's not sure why she did this). More tongue and mouth movements. More falls. Went to hospital for UTI, AKI, N/V/diarrhea In March 2016.   UPDATE 04/04/14: Since last visit, she reports that she is stable from PD standpoint. Tremor and gait are stable. Tolerating carb/levo 1.5 tabs TID. Hasn't tried going up to 2 tabs TID. Re: swallowing issues, she reports remote esophagus surgery for swallow issues 15 yrs ago. Feels that this is a problem again, and wants to see GI. Re: rash, it turns out that they had bedbugs in their home; now problem is resolved. Struggling with diarrhea and excess sweating.  UPDATE 12/26/13: Since last visit, was hospitalized (2 weeks ago) for delirium, hallucinations, chest pain, UTI, and medication overuse. Was taking ambien and a friend's xanax to help her sleep (was struggling with anxiety and insomnia; not seen Dr. Casimiro Needle in a while). Now back at home and doing better. Tremor and gait are worsening. Some wearing off (early AM and late evening). Also with 2 weeks are night time itching and rash. Going to see dermatology soon.   UPDATE 01/18/13:  Since last visit, now on carb/levo 1.5 tabs TID. Nausea is improved since she started taking her metformin at different time than carb/levo. Tremor, balance, coordination are stable.  UPDATE 06/01/12: Since last visit, tried carb/levo 1.5 tabs TID x 1 week, then stopped. Felt like it was too much medicine. Did not have increased side effects. Has been struggling with nausea throughout. Tremor is persistent.  UPDATE 01/28/12: Doing well. No wearing off. Tolerating meds. Stopped azilect (per PCP for nausea). Now on fluoxetine for depression.  UPDATE 08/27/10: Doing better on carb/levo.  Taking 1 tab TID (6am, 4pm, 9pm).  Minimal nausea.  Wakes up with more tremor in AM, then reduction in tremor 82min after dose.  Effect wears off around 4pm.  Daughter reports some intermittent confusion.  PRIOR HPI (06/26/10): 70 yo Caucasian female referred to [place] for tremor with concern for possible Parkinson's disease. [name] notes she first noticed a resting tremor in her left hand about 3 years ago, and this has gotten progressively worse since then. It has also spread to now involve her mouth and right hand as well, though she notes it is still worst in her left hand. Stress and anxiety can accentuate the tremors, whereas active use can reduce them. She also notes the tremors being worse in the morning. She denies noting anything else that seems to make the tremors better or worse. She admits to what seems like possible bradykinesia, in her words that she has "a hard time getting going  sometimes," but denies any freezing. She does note some new difficulty with writing, but denies micrographia. She also admits to feeling like her balance and walking is "a bit off," but she relates this more to the osteoarthritis in her knees and having had surgery on her R knee. She denies orthostatic symptoms, hallucinations or delusions, difficulty standing or sitting, weakness, new visual changes (aside from her macular degeneration),  or feelings of rigidity. She also denies bizarre dreams, nightmares, RLS symptoms, or REM behavior disorder symptoms. She admits to having two uncles who have tremors, etiology unclear, but adds that one uncle had alcoholism (and it was believed this was the cause). She is concerned about what is causing her tremors, and admits that although she initially "put off" being evaluated further, she is anxious to know what might be the cause of her tremors.   REVIEW OF SYSTEMS: Full 14 system review of systems performed and negative except for: excess sweating runny nose trouble swallowing diarrhea incontinence freq waking back pain depression anxiety.   ALLERGIES: Allergies  Allergen Reactions  . Ace Inhibitors Anaphylaxis and Swelling  . Azilect [Rasagiline Mesylate] Other (See Comments)    hypotension  . Lisinopril Swelling    Severe facial angioedema requiring hospitalization 2007 (approx)    HOME MEDICATIONS: Outpatient Prescriptions Prior to Visit  Medication Sig Dispense Refill  . aspirin 81 MG tablet Take 1 tablet (81 mg total) by mouth daily. 30 tablet   . fluticasone (FLONASE) 50 MCG/ACT nasal spray Place 2 sprays into both nostrils daily. 16 g 12  . gabapentin (NEURONTIN) 300 MG capsule Take 600 mg by mouth 3 (three) times daily.     . metFORMIN (GLUCOPHAGE) 500 MG tablet Take 1 tablet (500 mg total) by mouth 2 (two) times daily with a meal. 180 tablet 3  . olmesartan (BENICAR) 5 MG tablet TAKE 1 TABLET BY MOUTH DAILY 90 tablet 1  . omeprazole (PRILOSEC) 40 MG capsule TAKE 1 CAPSULE BY MOUTH EVERY DAY 90 capsule 3  . ONE TOUCH ULTRA TEST test strip USE AS DIRECTED 100 each 12  . ONETOUCH DELICA LANCETS [id number] MISC Daily for Glucose Testing 300 each 1  . propranolol ER (INDERAL LA) 80 MG 24 hr capsule TAKE 1 CAPSULE BY MOUTH DAILY 90 capsule 3  . sertraline (ZOLOFT) 100 MG tablet Take 100 mg by mouth daily.     . traMADol (ULTRAM) 50 MG tablet TAKE 1 TO 2 TABLETS BY MOUTH TWICE DAILY AS  NEEDED FOR PAIN 120 tablet 3  . carbidopa-levodopa (SINEMET) 25-100 MG per tablet Take 2 tablets by mouth 3 (three) times daily. 180 tablet 12   Facility-Administered Medications Prior to Visit  Medication Dose Route Frequency Provider Last Rate Last Dose  . cyanocobalamin ((VITAMIN B-12)) injection 1,000 mcg  1,000 mcg Intramuscular Once Mariel Aloe, MD        PAST MEDICAL HISTORY: Past Medical History  Diagnosis Date  . Allergy   . Anxiety   . Hyperlipidemia   . Movement disorder   . Hypertension   . Scoliosis   . Esophageal stricture   . Cognitive impairment 12/11/2013  . Parkinson's disease ([place])   . Complication of anesthesia 07/2008    "hard to get me woke up when I had my knee replaced; they said they had to bring me back"  . Chronic bronchitis ([place])     "get it q yr"  . Type II diabetes mellitus ([place])   . GERD (gastroesophageal reflux disease)   .  Arthritis     "knees, back" (06/29/2014)  . Osteoarthritis   . Chronic mid back pain   . History of gout 1970's  . Situational depression   . UTI (lower urinary tract infection)     PAST SURGICAL HISTORY: Past Surgical History  Procedure Laterality Date  . 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 Esophagogastric fundoplasty      some type "esoph surgery" per pt  . Colon surgery    . Abdominal hysterectomy    . Cholecystectomy open    . Joint replacement    . Dilation and curettage of uterus    . Tubal ligation      FAMILY HISTORY: Family History  Problem Relation Age of Onset  . Cerebral palsy Son   . Heart disease Mother   . Diabetes Mother   . Heart disease Father   . Heart attack Sister     SOCIAL HISTORY:  Social History   Social History  . Marital Status: Married    Spouse Name: [name]  . Number of Children: 2  . Years of Education: 14   Occupational History  . Retired    Social History Main Topics  . Smoking status: Never Smoker   . Smokeless tobacco: Never Used  . Alcohol Use: No  .  Drug Use: No  . Sexual Activity: Not on file   Other Topics Concern  . Not on file   Social History Narrative   Health Care POA:    Emergency Contact: daughter, [name], [phone] husband, [name] [phone]   End of Life Plan:   Who lives with you: Lives with husband and son, [name]   Any pets: Rabbit, [name]   Diet: Patient has a varied diet but struggles with what to eat with hypertension, diabetes, parkinsons   Exercise: Patient does not have any regular exercise routine.   Seatbelts: Patient reports wearing her seatbelt when in vehicle.   Nancy Fetter Exposure/Protection: Patient reports wearing sun block lotion daily.   Hobbies: Watching game shows, writing poetry, writing in journal, volunteering at day program with son.    Caffeine Use: very little occasional             PHYSICAL EXAM  Filed Vitals:   09/03/15 1503  BP: 140/94  Pulse: 102  Height: 5\' 4"  (1.626 m)  Weight: 190 lb 6.4 oz (86.365 kg)    Not recorded      Body mass index is 32.67 kg/(m^2).  GENERAL EXAM: General: Patient is awake, alert and in no acute distress.  Well developed and groomed. Neck: Neck is supple. Cardiovascular: No carotid artery bruits.  Heart is regular rate and rhythm with no murmurs.  Neurologic Exam  Mental Status: Awake, alert. Language is fluent and comprehension intact. Cranial Nerves: Pupils are equal and reactive to light.  Visual fields are full to confrontation.  Conjugate eye movements are full and symmetric.  Facial sensation and strength are symmetric.  Hearing is intact.  Palate elevated symmetrically and uvula is midline.  Shoulder shrug is symmetric.  Tongue is midline. MILD OROLINGUAL DYSKINESIAS. Motor: INTERMITTENT RESTING TREMOR OF BUE AND MOUTH. MINIMAL POSTURAL TREMOR. BRADYKINESIA AND RIGIDITY IN LUE>RUE, LLE>RLE. Normal bulk. Full strength in the upper and lower extremities.  No pronator drift. Sensory: Intact and symmetric to light touch. Coordination: No  ataxia or dysmetria on finger-nose or rapid alternating movement testing. Reflexes: BUE and BLE present and symmetric Gait and Station: Narrow based gait. DECR ARM SWING. DYSTONIA POSTURING OF HAND WHILE  WALKING   DIAGNOSTIC DATA (LABS, IMAGING, TESTING) - I reviewed patient records, labs, notes, testing and imaging myself where available.  Lab Results  Component Value Date   WBC 9.8 07/28/2014   HGB 14.4 07/28/2014   HCT 42.0 07/28/2014   MCV 89.2 07/28/2014   PLT 429* 07/28/2014      Component Value Date/Time   NA 139 08/08/2015 1008   K 4.1 08/08/2015 1008   CL 102 08/08/2015 1008   CO2 25 08/08/2015 1008   GLUCOSE 115* 08/08/2015 1008   BUN 23 08/08/2015 1008   CREATININE 1.16* 08/08/2015 1008   CREATININE 1.14* 06/30/2014 0430   CALCIUM 9.8 08/08/2015 1008   PROT 7.0 05/16/2015 1108   ALBUMIN 4.1 05/16/2015 1108   AST 21 05/16/2015 1108   ALT 12 05/16/2015 1108   ALKPHOS 75 05/16/2015 1108   BILITOT 0.5 05/16/2015 1108   GFRNONAA 48* 08/08/2015 1008   GFRNONAA 48* 06/30/2014 0430   GFRAA 55* 08/08/2015 1008   GFRAA 56* 06/30/2014 0430   Lab Results  Component Value Date   CHOL 244* 06/07/2014   HDL 38.60* 06/07/2014   LDLCALC 123* 12/11/2013   LDLDIRECT 108 02/14/2015   TRIG 384.0* 06/07/2014   CHOLHDL 6 06/07/2014   Lab Results  Component Value Date   HGBA1C 6.0 08/08/2015   Lab Results  Component Value Date   VITAMINB12 301 01/05/2008   Lab Results  Component Value Date   TSH 1.174 02/14/2015    07/05/10 MRI BRAIN - minimal scattered chronic small vessel ischemic disease   ASSESSMENT AND PLAN  69 y.o. female with resting tremor of BUE, cogwheel rigidity, bradykinesia, decreased arm swing, anxiety, diff swallowing, memory loss. Some progression of symptoms.  Dx:  Parkinson's disease (Minor)  Gait difficulty  Dyskinesia    PLAN: - continue carb/levo 25/100 1.5 tabs TID - recommend more supervision at home with medications and ADLs - may  consider repeat PT in 3-6 months  Meds ordered this encounter  Medications  . carbidopa-levodopa (SINEMET) 25-100 MG tablet    Sig: Take 1.5 tablets by mouth 3 (three) times daily.    Dispense:  150 tablet    Refill:  12   Return in about 6 months (around 03/05/2016).    Penni Bombard, MD [id number], [id number] PM Certified in Neurology, Neurophysiology and Neuroimaging  [facility] [address], [place], [place] [address] [phone]

## 2015-09-06 ENCOUNTER — Ambulatory Visit: Payer: Commercial Managed Care - HMO | Admitting: Diagnostic Neuroimaging

## 2015-09-20 ENCOUNTER — Other Ambulatory Visit (HOSPITAL_COMMUNITY): Payer: Self-pay | Admitting: Psychiatry

## 2015-10-23 DIAGNOSIS — F3341 Major depressive disorder, recurrent, in partial remission: Secondary | ICD-10-CM | POA: Diagnosis not present

## 2015-11-05 ENCOUNTER — Other Ambulatory Visit: Payer: Self-pay | Admitting: Family Medicine

## 2015-11-14 ENCOUNTER — Other Ambulatory Visit: Payer: Self-pay | Admitting: Diagnostic Neuroimaging

## 2015-12-26 ENCOUNTER — Ambulatory Visit: Payer: Self-pay

## 2015-12-28 ENCOUNTER — Other Ambulatory Visit: Payer: Self-pay | Admitting: Family Medicine

## 2016-02-15 DIAGNOSIS — I10 Essential (primary) hypertension: Secondary | ICD-10-CM | POA: Diagnosis not present

## 2016-02-15 DIAGNOSIS — Z87440 Personal history of urinary (tract) infections: Secondary | ICD-10-CM | POA: Diagnosis not present

## 2016-02-15 DIAGNOSIS — W101XXA Fall (on)(from) sidewalk curb, initial encounter: Secondary | ICD-10-CM | POA: Diagnosis not present

## 2016-02-15 DIAGNOSIS — S0990XA Unspecified injury of head, initial encounter: Secondary | ICD-10-CM | POA: Diagnosis not present

## 2016-02-15 DIAGNOSIS — S0081XA Abrasion of other part of head, initial encounter: Secondary | ICD-10-CM | POA: Diagnosis not present

## 2016-02-15 DIAGNOSIS — Z79891 Long term (current) use of opiate analgesic: Secondary | ICD-10-CM | POA: Diagnosis not present

## 2016-02-15 DIAGNOSIS — E119 Type 2 diabetes mellitus without complications: Secondary | ICD-10-CM | POA: Diagnosis not present

## 2016-02-15 DIAGNOSIS — G20 Parkinson's disease: Secondary | ICD-10-CM | POA: Diagnosis not present

## 2016-02-15 DIAGNOSIS — Z79899 Other long term (current) drug therapy: Secondary | ICD-10-CM | POA: Diagnosis not present

## 2016-02-15 DIAGNOSIS — Z9049 Acquired absence of other specified parts of digestive tract: Secondary | ICD-10-CM | POA: Diagnosis not present

## 2016-02-15 DIAGNOSIS — Z7984 Long term (current) use of oral hypoglycemic drugs: Secondary | ICD-10-CM | POA: Diagnosis not present

## 2016-02-15 DIAGNOSIS — S0993XA Unspecified injury of face, initial encounter: Secondary | ICD-10-CM | POA: Diagnosis not present

## 2016-02-22 ENCOUNTER — Ambulatory Visit (INDEPENDENT_AMBULATORY_CARE_PROVIDER_SITE_OTHER): Payer: Commercial Managed Care - HMO | Admitting: *Deleted

## 2016-02-22 ENCOUNTER — Telehealth: Payer: Self-pay | Admitting: Family Medicine

## 2016-02-22 DIAGNOSIS — Z23 Encounter for immunization: Secondary | ICD-10-CM | POA: Diagnosis not present

## 2016-02-22 NOTE — Telephone Encounter (Signed)
Pt has a ringing in her head. Pt has Parkinson's disease, she fell last Friday incurred a head injury and was sent to ED by ambulance in [place]. Pt would like flu shot and to be examined by Dr. Nori Riis as a follow up after this incident. Nat Christen, CMA

## 2016-02-22 NOTE — Telephone Encounter (Signed)
Patient came in office wanting to see Dr Nori Riis today. Told patient she was not on schedule today. Patient request Dr Nori Riis give her a call at [phone]. Patient would not say what in regards to.

## 2016-02-25 NOTE — Telephone Encounter (Signed)
Scheduled a free diabetic eye exam from [facility] 03/21/16 at 1:30pm

## 2016-02-27 ENCOUNTER — Ambulatory Visit (INDEPENDENT_AMBULATORY_CARE_PROVIDER_SITE_OTHER): Payer: Commercial Managed Care - HMO | Admitting: Family Medicine

## 2016-02-27 ENCOUNTER — Encounter: Payer: Self-pay | Admitting: Family Medicine

## 2016-02-27 VITALS — BP 136/80 | HR 112 | Temp 97.8°F | Wt 181.0 lb

## 2016-02-27 DIAGNOSIS — G20 Parkinson's disease: Secondary | ICD-10-CM | POA: Diagnosis not present

## 2016-02-27 DIAGNOSIS — E119 Type 2 diabetes mellitus without complications: Secondary | ICD-10-CM

## 2016-02-27 DIAGNOSIS — I10 Essential (primary) hypertension: Secondary | ICD-10-CM | POA: Diagnosis not present

## 2016-02-27 DIAGNOSIS — E785 Hyperlipidemia, unspecified: Secondary | ICD-10-CM | POA: Diagnosis not present

## 2016-02-27 LAB — COMPLETE METABOLIC PANEL WITH GFR
ALT: 5 U/L — ABNORMAL LOW (ref 6–29)
AST: 22 U/L (ref 10–35)
Albumin: 4.3 g/dL (ref 3.6–5.1)
Alkaline Phosphatase: 90 U/L (ref 33–130)
BUN: 17 mg/dL (ref 7–25)
CO2: 27 mmol/L (ref 20–31)
Calcium: 9.8 mg/dL (ref 8.6–10.4)
Chloride: 104 mmol/L (ref 98–110)
Creat: 1.07 mg/dL — ABNORMAL HIGH (ref 0.60–0.93)
GFR, Est African American: 61 mL/min (ref >=60)
GFR, Est Non African American: 53 mL/min — ABNORMAL LOW (ref >=60)
Glucose, Bld: 96 mg/dL (ref 65–99)
Potassium: 4 mmol/L (ref 3.5–5.3)
Sodium: 141 mmol/L (ref 135–146)
Total Bilirubin: 0.5 mg/dL (ref 0.2–1.2)
Total Protein: 7.1 g/dL (ref 6.1–8.1)

## 2016-02-27 LAB — POCT GLYCOSYLATED HEMOGLOBIN (HGB A1C): Hemoglobin A1C: 5.8

## 2016-02-27 LAB — LDL CHOLESTEROL, DIRECT: Direct LDL: 179 mg/dL — ABNORMAL HIGH (ref <130)

## 2016-02-27 MED ORDER — CYANOCOBALAMIN 1000 MCG/ML IJ SOLN
1000.0000 ug | Freq: Once | INTRAMUSCULAR | Status: AC
  Administered 2016-02-27: 1000 ug via INTRAMUSCULAR

## 2016-02-27 NOTE — Patient Instructions (Signed)
Greatto see you! I will see you back in about 3 months

## 2016-02-29 ENCOUNTER — Encounter: Payer: Self-pay | Admitting: Family Medicine

## 2016-02-29 NOTE — Assessment & Plan Note (Signed)
Followed by neurology. Medication adjustments noted.

## 2016-02-29 NOTE — Assessment & Plan Note (Signed)
Check direct LDL today.  °

## 2016-02-29 NOTE — Progress Notes (Signed)
    CHIEF COMPLAINT / HPI:   #1. Once left knee injection as her knees bothering her again. Been quite a while since her injection. #2. Follow-up Parkinson's. She recently saw her neurologist and he made some medication changes. #3. She's had some memory issues that she wants to talk about. #4. Follow-up or diabetes mellitus. She's not had a episodes of low blood sugar. She's not checking her blood sugars regularly. She's eating premature she wants. #5. Hypertension: Taking her medicines regular. Denies chest pain, denies unusual for some breath. #6. Activity: She just completed some rehabilitation specifically for Parkinson's patients and really enjoyed that. She did have a fall a couple of weeks ago. Was seen at an urgent care center. She still has a little bit of a black eye but that is resolving. No other sequela. Unfortunately the fact that she had a fall keep her from going back to the rehabilitation for several months. Evidently they have to be 6 months without a fall before they can attend again. She's a little sad about this.  REVIEW OF SYSTEMS:  See history of present illness.  OBJECTIVE:  Vital signs are reviewed.   Vital signs reviewed. GENERAL: Well-developed, well-nourished, no acute distress. CARDIOVASCULAR: Regular rate and rhythm no murmur gallop or rub LUNGS: Clear to auscultation bilaterally, no rales or wheeze. ABDOMEN: Soft positive bowel sounds NEURO: Cogwheel rigidity. Intention tremor. Or buccal facial dyskinesias. MSK: Movement of extremity x 4.    ASSESSMENT / PLAN: Please see problem oriented charting for details

## 2016-02-29 NOTE — Assessment & Plan Note (Signed)
Good control. No medication changes. We'll continue with current medication regimen. Lab work today. Follow-up 3 months.

## 2016-02-29 NOTE — Assessment & Plan Note (Signed)
She's doing quite well with her A1c in good control today. We will not make any medication changes. I will check some lab work. See her back in 3 months.

## 2016-03-05 ENCOUNTER — Ambulatory Visit: Payer: Commercial Managed Care - HMO | Admitting: Diagnostic Neuroimaging

## 2016-03-09 ENCOUNTER — Encounter: Payer: Self-pay | Admitting: Family Medicine

## 2016-03-12 ENCOUNTER — Ambulatory Visit (INDEPENDENT_AMBULATORY_CARE_PROVIDER_SITE_OTHER): Payer: Commercial Managed Care - HMO | Admitting: Diagnostic Neuroimaging

## 2016-03-12 ENCOUNTER — Encounter: Payer: Self-pay | Admitting: Diagnostic Neuroimaging

## 2016-03-12 VITALS — BP 98/66 | HR 96 | Wt 179.8 lb

## 2016-03-12 DIAGNOSIS — G20 Parkinson's disease: Secondary | ICD-10-CM | POA: Diagnosis not present

## 2016-03-12 DIAGNOSIS — R269 Unspecified abnormalities of gait and mobility: Secondary | ICD-10-CM

## 2016-03-12 MED ORDER — CARBIDOPA-LEVODOPA 25-100 MG PO TABS: 1.0000 | ORAL_TABLET | Freq: Three times a day (TID) | ORAL | 4 refills | Status: DC

## 2016-03-12 NOTE — Progress Notes (Signed)
[facility]  PATIENT: [name] DOB: 1945/11/01  REFERRING CLINICIAN:  HISTORY FROM: patient and husband REASON FOR VISIT: follow up   HISTORICAL  CHIEF COMPLAINT:  Chief Complaint  Patient presents with  . Parkinson's disease    rm 5, husband- [name], "5 falls in past 6 months due to PD, hit head, rib injury, went to ED"  . Follow-up    6 month    HISTORY OF PRESENT ILLNESS:   UPDATE 03/12/16: Since last visit, continues to have falls; ~ 5 x in last 6 months. Not using cane or walker. Had episode of believing that she went to a party in [place], [place] (which she did not) and then told her family about it. No further delusions or hallucinations. Has been on lower carb/levo (1 tab twice a day) lately due to nausea.  UPDATE 09/03/15: Since last visit, stable. Did well with PT. Doing well with lower dose of carb/levo.   UPDATE 02/13/15: Since last visit, continues to have tremor, tongue and mouth movments (better), sweating, anxiety, inverted sleep schedule.   UPDATE 10/09/14: Since last visit, had increased carb/levo up to 2.5 tabs TID (even though I had recommended going up to 2tabs TID; she's not sure why she did this). More tongue and mouth movements. More falls. Went to hospital for UTI, AKI, N/V/diarrhea In March 2016.   UPDATE 04/04/14: Since last visit, she reports that she is stable from PD standpoint. Tremor and gait are stable. Tolerating carb/levo 1.5 tabs TID. Hasn't tried going up to 2 tabs TID. Re: swallowing issues, she reports remote esophagus surgery for swallow issues 15 yrs ago. Feels that this is a problem again, and wants to see GI. Re: rash, it turns out that they had bedbugs in their home; now problem is resolved. Struggling with diarrhea and excess sweating.  UPDATE 12/26/13: Since last visit, was hospitalized (2 weeks ago) for delirium, hallucinations, chest pain, UTI, and medication overuse. Was taking ambien and a friend's xanax  to help her sleep (was struggling with anxiety and insomnia; not seen Dr. Casimiro Needle in a while). Now back at home and doing better. Tremor and gait are worsening. Some wearing off (early AM and late evening). Also with 2 weeks are night time itching and rash. Going to see dermatology soon.   UPDATE 01/18/13: Since last visit, now on carb/levo 1.5 tabs TID. Nausea is improved since she started taking her metformin at different time than carb/levo. Tremor, balance, coordination are stable.  UPDATE 06/01/12: Since last visit, tried carb/levo 1.5 tabs TID x 1 week, then stopped. Felt like it was too much medicine. Did not have increased side effects. Has been struggling with nausea throughout. Tremor is persistent.  UPDATE 01/28/12: Doing well. No wearing off. Tolerating meds. Stopped azilect (per PCP for nausea). Now on fluoxetine for depression.  UPDATE 08/27/10: Doing better on carb/levo.  Taking 1 tab TID (6am, 4pm, 9pm).  Minimal nausea.  Wakes up with more tremor in AM, then reduction in tremor 68min after dose.  Effect wears off around 4pm.  Daughter reports some intermittent confusion.  PRIOR HPI (06/26/10): 70 yo Caucasian female referred to [place] for tremor with concern for possible Parkinson's disease. [name] notes she first noticed a resting tremor in her left hand about 3 years ago, and this has gotten progressively worse since then. It has also spread to now involve her mouth and right hand as well, though she notes it is still worst in  her left hand. Stress and anxiety can accentuate the tremors, whereas active use can reduce them. She also notes the tremors being worse in the morning. She denies noting anything else that seems to make the tremors better or worse. She admits to what seems like possible bradykinesia, in her words that she has "a hard time getting going sometimes," but denies any freezing. She does note some new difficulty with writing, but denies micrographia. She also admits to  feeling like her balance and walking is "a bit off," but she relates this more to the osteoarthritis in her knees and having had surgery on her R knee. She denies orthostatic symptoms, hallucinations or delusions, difficulty standing or sitting, weakness, new visual changes (aside from her macular degeneration), or feelings of rigidity. She also denies bizarre dreams, nightmares, RLS symptoms, or REM behavior disorder symptoms. She admits to having two uncles who have tremors, etiology unclear, but adds that one uncle had alcoholism (and it was believed this was the cause). She is concerned about what is causing her tremors, and admits that although she initially "put off" being evaluated further, she is anxious to know what might be the cause of her tremors.   REVIEW OF SYSTEMS: Full 14 system review of systems performed and negative except for: excess sweating runny nose trouble swallowing diarrhea incontinence freq waking back pain depression anxiety hallucinations.    ALLERGIES: Allergies  Allergen Reactions  . Ace Inhibitors Anaphylaxis and Swelling  . Azilect [Rasagiline Mesylate] Other (See Comments)    hypotension  . Lisinopril Swelling    Severe facial angioedema requiring hospitalization 2007 (approx)    HOME MEDICATIONS: Outpatient Medications Prior to Visit  Medication Sig Dispense Refill  . aspirin 81 MG tablet Take 1 tablet (81 mg total) by mouth daily. 30 tablet   . carbidopa-levodopa (SINEMET IR) 25-100 MG tablet Take 1.5 tablets by mouth 3 (three) times daily. 450 tablet 4  . fluticasone (FLONASE) 50 MCG/ACT nasal spray Place 2 sprays into both nostrils daily. 16 g 12  . gabapentin (NEURONTIN) 300 MG capsule Take 600 mg by mouth 3 (three) times daily.     . metFORMIN (GLUCOPHAGE) 850 MG tablet TAKE 1 TABLET(850 MG) BY MOUTH DAILY WITH BREAKFAST 90 tablet 0  . olmesartan (BENICAR) 5 MG tablet TAKE 1 TABLET BY MOUTH DAILY 90 tablet 1  . omeprazole (PRILOSEC) 40 MG capsule TAKE  1 CAPSULE BY MOUTH EVERY DAY 90 capsule 3  . ONE TOUCH ULTRA TEST test strip USE AS DIRECTED 100 each 12  . ONETOUCH DELICA LANCETS 99991111 MISC Daily for Glucose Testing 300 each 1  . propranolol ER (INDERAL LA) 80 MG 24 hr capsule TAKE 1 CAPSULE BY MOUTH DAILY 90 capsule 3  . sertraline (ZOLOFT) 100 MG tablet Take 100 mg by mouth daily.     . temazepam (RESTORIL) 15 MG capsule 15 mg at bedtime.  2  . traMADol (ULTRAM) 50 MG tablet TAKE 1-2 TABLETS BY MOUTH TWICE DAILY AS NEEDED FOR PAIN 120 tablet 0  . carbidopa-levodopa (SINEMET) 25-100 MG tablet Take 1.5 tablets by mouth 3 (three) times daily. 150 tablet 12  . metFORMIN (GLUCOPHAGE) 500 MG tablet Take 1 tablet (500 mg total) by mouth 2 (two) times daily with a meal. 180 tablet 3   Facility-Administered Medications Prior to Visit  Medication Dose Route Frequency Provider Last Rate Last Dose  . cyanocobalamin ((VITAMIN B-12)) injection 1,000 mcg  1,000 mcg Intramuscular Once Mariel Aloe, MD  PAST MEDICAL HISTORY: Past Medical History:  Diagnosis Date  . Allergy   . Anxiety   . Arthritis    "knees, back" (06/29/2014)  . Chronic bronchitis ([place])    "get it q yr"  . Chronic mid back pain   . Cognitive impairment 12/11/2013  . Complication of anesthesia 07/2008   "hard to get me woke up when I had my knee replaced; they said they had to bring me back"  . Esophageal stricture   . GERD (gastroesophageal reflux disease)   . History of gout 1970's  . Hyperlipidemia   . Hypertension   . Movement disorder   . Osteoarthritis   . Parkinson's disease ([place])   . Scoliosis   . Situational depression   . Type II diabetes mellitus ([place])   . UTI (lower urinary tract infection)     PAST SURGICAL HISTORY: Past Surgical History:  Procedure Laterality Date  . ABDOMINAL HYSTERECTOMY    . CHOLECYSTECTOMY OPEN    . COLON SURGERY    . DILATION AND CURETTAGE OF UTERUS    . ESOPHAGOGASTRIC FUNDOPLASTY     some type "esoph surgery" per pt  .  JOINT REPLACEMENT    . TOTAL KNEE ARTHROPLASTY Right 07/2008  . TUBAL LIGATION      FAMILY HISTORY: Family History  Problem Relation Age of Onset  . Heart disease Mother   . Diabetes Mother   . Heart disease Father   . Heart attack Sister   . Heart attack Brother   . Cerebral palsy Son     SOCIAL HISTORY:  Social History   Social History  . Marital status: Married    Spouse name: [name]  . Number of children: 2  . Years of education: 14   Occupational History  . Retired Not Employed   Social History Main Topics  . Smoking status: Never Smoker  . Smokeless tobacco: Never Used  . Alcohol use No  . Drug use: No  . Sexual activity: Not on file   Other Topics Concern  . Not on file   Social History Narrative   Health Care POA:    Emergency Contact: daughter, [name], [phone] husband, [name] [phone]   End of Life Plan:   Who lives with you: Lives with husband and son, [name]   Any pets: Rabbit, [name]   Diet: Patient has a varied diet but struggles with what to eat with hypertension, diabetes, parkinsons   Exercise: Patient does not have any regular exercise routine.   Seatbelts: Patient reports wearing her seatbelt when in vehicle.   Nancy Fetter Exposure/Protection: Patient reports wearing sun block lotion daily.   Hobbies: Watching game shows, writing poetry, writing in journal, volunteering at day program with son.    Caffeine Use: very little occasional             PHYSICAL EXAM  Vitals:   03/12/16 0934  BP: 98/66  Pulse: 96  Weight: 179 lb 12.8 oz (81.6 kg)    Not recorded      Body mass index is 30.86 kg/m.  GENERAL EXAM: General: Patient is awake, alert and in no acute distress.  Well developed and groomed. Neck: Neck is supple. Cardiovascular: No carotid artery bruits.  Heart is regular rate and rhythm with no murmurs.  Neurologic Exam  Mental Status: Awake, alert. Language is fluent and comprehension intact. Cranial Nerves: Pupils  are equal and reactive to light.  Visual fields are full to confrontation.  Conjugate eye movements are full  and symmetric.  Facial sensation and strength are symmetric.  Hearing is intact.  Palate elevated symmetrically and uvula is midline.  Shoulder shrug is symmetric.  Tongue is midline. MILD OROLINGUAL DYSKINESIAS. Motor: INTERMITTENT RESTING TREMOR OF BUE AND MOUTH. MINIMAL POSTURAL TREMOR. BRADYKINESIA AND RIGIDITY IN LUE>RUE, LLE>RLE. Normal bulk. Full strength in the upper and lower extremities.  No pronator drift. Sensory: Intact and symmetric to light touch. Coordination: No ataxia or dysmetria on finger-nose or rapid alternating movement testing. Reflexes: BUE and BLE present and symmetric Gait and Station: Narrow based gait. DECR ARM SWING. DYSTONIA POSTURING OF HAND WHILE WALKING   DIAGNOSTIC DATA (LABS, IMAGING, TESTING) - I reviewed patient records, labs, notes, testing and imaging myself where available.  Lab Results  Component Value Date   WBC 9.8 07/28/2014   HGB 14.4 07/28/2014   HCT 42.0 07/28/2014   MCV 89.2 07/28/2014   PLT 429 (H) 07/28/2014      Component Value Date/Time   NA 141 02/27/2016 1029   K 4.0 02/27/2016 1029   CL 104 02/27/2016 1029   CO2 27 02/27/2016 1029   GLUCOSE 96 02/27/2016 1029   BUN 17 02/27/2016 1029   CREATININE 1.07 (H) 02/27/2016 1029   CALCIUM 9.8 02/27/2016 1029   PROT 7.1 02/27/2016 1029   ALBUMIN 4.3 02/27/2016 1029   AST 22 02/27/2016 1029   ALT 5 (L) 02/27/2016 1029   ALKPHOS 90 02/27/2016 1029   BILITOT 0.5 02/27/2016 1029   GFRNONAA 53 (L) 02/27/2016 1029   GFRAA 61 02/27/2016 1029   Lab Results  Component Value Date   CHOL 244 (H) 06/07/2014   HDL 38.60 (L) 06/07/2014   LDLCALC 123 (H) 12/11/2013   LDLDIRECT 179 (H) 02/27/2016   TRIG 384.0 (H) 06/07/2014   CHOLHDL 6 06/07/2014   Lab Results  Component Value Date   HGBA1C 5.8 02/27/2016   Lab Results  Component Value Date   VITAMINB12 301 01/05/2008   Lab  Results  Component Value Date   TSH 1.174 02/14/2015    07/05/10 MRI BRAIN - minimal scattered chronic small vessel ischemic disease   ASSESSMENT AND PLAN  70 y.o. female with resting tremor of BUE, cogwheel rigidity, bradykinesia, decreased arm swing, anxiety, diff swallowing, memory loss. Some progression of symptoms and increased falls.  Dx:  Parkinson's disease ([place])  Gait difficulty    PLAN: - continue carb/levo (25/100) 1 tab twice a day up to 2 tabs three times per day (patient will try to find better dosing) - recommend more supervision at home with medications and ADLs - use rollator walker  Meds ordered this encounter  Medications  . carbidopa-levodopa (SINEMET IR) 25-100 MG tablet    Sig: Take 1-2 tablets by mouth 3 (three) times daily.    Dispense:  450 tablet    Refill:  4   Orders Placed This Encounter  Procedures  . For home use only DME 4 wheeled rolling walker with seat   Return in about 6 months (around 09/09/2016).    Penni Bombard, MD [id number], [id number] AM Certified in Neurology, Neurophysiology and Neuroimaging  [facility] [address] [place], [place] [address] [phone]

## 2016-04-17 ENCOUNTER — Other Ambulatory Visit: Payer: Self-pay | Admitting: Family Medicine

## 2016-04-28 ENCOUNTER — Telehealth: Payer: Self-pay | Admitting: Diagnostic Neuroimaging

## 2016-04-28 NOTE — Telephone Encounter (Signed)
Patient has been scheduled on 05/05/16 with Megan increased lose of balance 3 falls since Friday.

## 2016-05-05 ENCOUNTER — Ambulatory Visit (INDEPENDENT_AMBULATORY_CARE_PROVIDER_SITE_OTHER): Payer: Commercial Managed Care - HMO | Admitting: Adult Health

## 2016-05-05 ENCOUNTER — Encounter: Payer: Self-pay | Admitting: Adult Health

## 2016-05-05 VITALS — BP 148/90 | HR 68 | Ht 64.0 in | Wt 183.4 lb

## 2016-05-05 DIAGNOSIS — G20 Parkinson's disease: Secondary | ICD-10-CM

## 2016-05-05 DIAGNOSIS — R269 Unspecified abnormalities of gait and mobility: Secondary | ICD-10-CM

## 2016-05-05 NOTE — Progress Notes (Signed)
PATIENT: [name] DOB: 28-Dec-1945  REASON FOR VISIT: follow up- Parkinson's disease, abnormality of gait HISTORY FROM: patient  HISTORY OF PRESENT ILLNESS: Update 05/05/16 (MM): [name] is a 71 year old female with a history of Parkinson's disease. She returns today after experiencing increased falls in the last 2 months. She states that some of the falls have been severe and she has fallen on her face. Fortunately she has not suffered any significant injuries. She does state that her left shoulder and neck is slightly tight after one of the falls. She is not using an assistive device. She states that she did purchase a walker last week. Her husband states that she tends to drag her feet when ambulating. Reports that she had physical therapy over a year ago. She is currently taking Sinemet 25-100 milligrams one and a half tablets 3 times a day. She takes her first dose at 8:30, second dose at 3 PM and last dose at 9:30 PM. She states that she can tell a difference if she misses a dose. Denies any changes in her sleep. She states that if she does not chew her food up well she will become choked. Denies getting choked on liquids. Reports that she does have a tremor in the hands and mouth. She does note that she shuffles her feet when ambulating. She returns today for an evaluation.  UPDATE 03/12/16: Since last visit, continues to have falls; ~ 5 x in last 6 months. Not using cane or walker. Had episode of believing that she went to a party in [place], [place] (which she did not) and then told her family about it. No further delusions or hallucinations. Has been on lower carb/levo (1 tab twice a day) lately due to nausea.  UPDATE 09/03/15: Since last visit, stable. Did well with PT. Doing well with lower dose of carb/levo.   UPDATE 02/13/15: Since last visit, continues to have tremor, tongue and mouth movments (better), sweating, anxiety, inverted sleep schedule.   UPDATE 10/09/14: Since  last visit, had increased carb/levo up to 2.5 tabs TID (even though I had recommended going up to 2tabs TID; she's not sure why she did this). More tongue and mouth movements. More falls. Went to hospital for UTI, AKI, N/V/diarrhea In March 2016.   UPDATE 04/04/14: Since last visit, she reports that she is stable from PD standpoint. Tremor and gait are stable. Tolerating carb/levo 1.5 tabs TID. Hasn't tried going up to 2 tabs TID. Re: swallowing issues, she reports remote esophagus surgery for swallow issues 15 yrs ago. Feels that this is a problem again, and wants to see GI. Re: rash, it turns out that they had bedbugs in their home; now problem is resolved. Struggling with diarrhea and excess sweating.  UPDATE 12/26/13: Since last visit, was hospitalized (2 weeks ago) for delirium, hallucinations, chest pain, UTI, and medication overuse. Was taking ambien and a friend's xanax to help her sleep (was struggling with anxiety and insomnia; not seen Dr. Casimiro Needle in a while). Now back at home and doing better. Tremor and gait are worsening. Some wearing off (early AM and late evening). Also with 2 weeks are night time itching and rash. Going to see dermatology soon.   UPDATE 01/18/13: Since last visit, now on carb/levo 1.5 tabs TID. Nausea is improved since she started taking her metformin at different time than carb/levo. Tremor, balance, coordination are stable.  UPDATE 06/01/12: Since last visit, tried carb/levo 1.5 tabs TID x 1 week, then stopped.  Felt like it was too much medicine. Did not have increased side effects. Has been struggling with nausea throughout. Tremor is persistent.  UPDATE 01/28/12: Doing well. No wearing off. Tolerating meds. Stopped azilect (per PCP for nausea). Now on fluoxetine for depression.  UPDATE 08/27/10: Doing better on carb/levo.  Taking 1 tab TID (6am, 4pm, 9pm).  Minimal nausea.  Wakes up with more tremor in AM, then reduction in tremor 32min after dose.  Effect wears  off around 4pm.  Daughter reports some intermittent confusion.  PRIOR HPI (06/26/10): 71 yo Caucasian female referred to [place] for tremor with concern for possible Parkinson's disease. [name] notes she first noticed a resting tremor in her left hand about 3 years ago, and this has gotten progressively worse since then. It has also spread to now involve her mouth and right hand as well, though she notes it is still worst in her left hand. Stress and anxiety can accentuate the tremors, whereas active use can reduce them. She also notes the tremors being worse in the morning. She denies noting anything else that seems to make the tremors better or worse. She admits to what seems like possible bradykinesia, in her words that she has "a hard time getting going sometimes," but denies any freezing. She does note some new difficulty with writing, but denies micrographia. She also admits to feeling like her balance and walking is "a bit off," but she relates this more to the osteoarthritis in her knees and having had surgery on her R knee. She denies orthostatic symptoms, hallucinations or delusions, difficulty standing or sitting, weakness, new visual changes (aside from her macular degeneration), or feelings of rigidity. She also denies bizarre dreams, nightmares, RLS symptoms, or REM behavior disorder symptoms. She admits to having two uncles who have tremors, etiology unclear, but adds that one uncle had alcoholism (and it was believed this was the cause). She is concerned about what is causing her tremors, and admits that although she initially "put off" being evaluated further, she is anxious to know what might be the cause of her tremors.  REVIEW OF SYSTEMS: Out of a complete 14 system review of symptoms, the patient complains only of the following symptoms, and all other reviewed systems are negative. Speech difficulty, back pain, snoring, sleep talking, eye redness, eye discharge, excessive  sweating  ALLERGIES: Allergies  Allergen Reactions  . Ace Inhibitors Anaphylaxis and Swelling  . Azilect [Rasagiline Mesylate] Other (See Comments)    hypotension  . Lisinopril Swelling    Severe facial angioedema requiring hospitalization 2007 (approx)    HOME MEDICATIONS: Outpatient Medications Prior to Visit  Medication Sig Dispense Refill  . aspirin 81 MG tablet Take 1 tablet (81 mg total) by mouth daily. 30 tablet   . carbidopa-levodopa (SINEMET IR) 25-100 MG tablet Take 1-2 tablets by mouth 3 (three) times daily. 450 tablet 4  . fluticasone (FLONASE) 50 MCG/ACT nasal spray SHAKE LIQUID AND USE 2 SPRAYS IN EACH NOSTRIL DAILY 16 g 1  . gabapentin (NEURONTIN) 300 MG capsule Take 600 mg by mouth 3 (three) times daily.     . metFORMIN (GLUCOPHAGE) 850 MG tablet TAKE 1 TABLET(850 MG) BY MOUTH DAILY WITH BREAKFAST 90 tablet 0  . olmesartan (BENICAR) 5 MG tablet TAKE 1 TABLET BY MOUTH DAILY 90 tablet 1  . omeprazole (PRILOSEC) 40 MG capsule TAKE 1 CAPSULE BY MOUTH EVERY DAY 90 capsule 0  . ONE TOUCH ULTRA TEST test strip USE AS DIRECTED 100 each 12  .  ONETOUCH DELICA LANCETS [id number] MISC Daily for Glucose Testing 300 each 1  . propranolol ER (INDERAL LA) 80 MG 24 hr capsule TAKE 1 CAPSULE BY MOUTH DAILY 90 capsule 3  . sertraline (ZOLOFT) 100 MG tablet Take 100 mg by mouth daily.     . temazepam (RESTORIL) 15 MG capsule 15 mg at bedtime as needed.   2  . traMADol (ULTRAM) 50 MG tablet TAKE 1-2 TABLETS BY MOUTH TWICE DAILY AS NEEDED FOR PAIN 120 tablet 0   Facility-Administered Medications Prior to Visit  Medication Dose Route Frequency Provider Last Rate Last Dose  . cyanocobalamin ((VITAMIN B-12)) injection 1,000 mcg  1,000 mcg Intramuscular Once Mariel Aloe, MD        PAST MEDICAL HISTORY: Past Medical History:  Diagnosis Date  . Allergy   . Anxiety   . Arthritis    "knees, back" (06/29/2014)  . Chronic bronchitis ([place])    "get it q yr"  . Chronic mid back pain   . Cognitive  impairment 12/11/2013  . Complication of anesthesia 07/2008   "hard to get me woke up when I had my knee replaced; they said they had to bring me back"  . Esophageal stricture   . GERD (gastroesophageal reflux disease)   . History of gout 1970's  . Hyperlipidemia   . Hypertension   . Movement disorder   . Osteoarthritis   . Parkinson's disease ([place])   . Scoliosis   . Situational depression   . Type II diabetes mellitus ([place])   . UTI (lower urinary tract infection)     PAST SURGICAL HISTORY: Past Surgical History:  Procedure Laterality Date  . ABDOMINAL HYSTERECTOMY    . CHOLECYSTECTOMY OPEN    . COLON SURGERY    . DILATION AND CURETTAGE OF UTERUS    . ESOPHAGOGASTRIC FUNDOPLASTY     some type "esoph surgery" per pt  . JOINT REPLACEMENT    . TOTAL KNEE ARTHROPLASTY Right 07/2008  . TUBAL LIGATION      FAMILY HISTORY: Family History  Problem Relation Age of Onset  . Heart disease Mother   . Diabetes Mother   . Heart disease Father   . Heart attack Sister   . Heart attack Brother   . Cerebral palsy Son     SOCIAL HISTORY: Social History   Social History  . Marital status: Married    Spouse name: [name]  . Number of children: 2  . Years of education: 14   Occupational History  . Retired Not Employed   Social History Main Topics  . Smoking status: Never Smoker  . Smokeless tobacco: Never Used  . Alcohol use No  . Drug use: No  . Sexual activity: Not on file   Other Topics Concern  . Not on file   Social History Narrative   Health Care POA:    Emergency Contact: daughter, [name], [phone] husband, [name] [phone]   End of Life Plan:   Who lives with you: Lives with husband and son, [name]   Any pets: Rabbit, [name]   Diet: Patient has a varied diet but struggles with what to eat with hypertension, diabetes, parkinsons   Exercise: Patient does not have any regular exercise routine.   Seatbelts: Patient reports wearing her seatbelt when in  vehicle.   Nancy Fetter Exposure/Protection: Patient reports wearing sun block lotion daily.   Hobbies: Watching game shows, writing poetry, writing in journal, volunteering at day program with son.    Caffeine Use: very  little occasional               PHYSICAL EXAM  Vitals:   05/05/16 0905  BP: (!) 148/90  Pulse: 68  Weight: 183 lb 6.4 oz (83.2 kg)  Height: 5\' 4"  (1.626 m)   Body mass index is 31.48 kg/m.  Generalized: Well developed, in no acute distress   Neurological examination  Mentation: Alert oriented to time, place, history taking. Follows all commands speech and language fluent Cranial nerve II-XII: Pupils were equal round reactive to light. Extraocular movements were full, visual field were full on confrontational test. Facial sensation and strength were normal. Uvula tongue midline. Head turning and shoulder shrug  were normal and symmetric. Orolingual dyskinesia. Motor: The motor testing reveals 5 over 5 strength of all 4 extremities. Tremor noted in both hands and mouth. Sensory: Sensory testing is intact to soft touch on all 4 extremities. No evidence of extinction is noted.  Coordination: Cerebellar testing reveals good finger-nose-finger and heel-to-shin bilaterally.  Gait and station: Patient is able to stand without assistance. Patient has a slightly stooped posture. Decreased arm swing. Intermittent shuffling gait. Tandem gait not attempted Reflexes: Deep tendon reflexes are symmetric and normal bilaterally.   DIAGNOSTIC DATA (LABS, IMAGING, TESTING) - I reviewed patient records, labs, notes, testing and imaging myself where available.  Lab Results  Component Value Date   WBC 9.8 07/28/2014   HGB 14.4 07/28/2014   HCT 42.0 07/28/2014   MCV 89.2 07/28/2014   PLT 429 (H) 07/28/2014      Component Value Date/Time   NA 141 02/27/2016 1029   K 4.0 02/27/2016 1029   CL 104 02/27/2016 1029   CO2 27 02/27/2016 1029   GLUCOSE 96 02/27/2016 1029   BUN 17  02/27/2016 1029   CREATININE 1.07 (H) 02/27/2016 1029   CALCIUM 9.8 02/27/2016 1029   PROT 7.1 02/27/2016 1029   ALBUMIN 4.3 02/27/2016 1029   AST 22 02/27/2016 1029   ALT 5 (L) 02/27/2016 1029   ALKPHOS 90 02/27/2016 1029   BILITOT 0.5 02/27/2016 1029   GFRNONAA 53 (L) 02/27/2016 1029   GFRAA 61 02/27/2016 1029   Lab Results  Component Value Date   CHOL 244 (H) 06/07/2014   HDL 38.60 (L) 06/07/2014   LDLCALC 123 (H) 12/11/2013   LDLDIRECT 179 (H) 02/27/2016   TRIG 384.0 (H) 06/07/2014   CHOLHDL 6 06/07/2014   Lab Results  Component Value Date   HGBA1C 5.8 02/27/2016   Lab Results  Component Value Date   VITAMINB12 301 01/05/2008   Lab Results  Component Value Date   TSH 1.174 02/14/2015      ASSESSMENT AND PLAN 71 y.o. year old female  has a past medical history of Allergy; Anxiety; Arthritis; Chronic bronchitis ([place]); Chronic mid back pain; Cognitive impairment (12/11/2013); Complication of anesthesia (07/2008); Esophageal stricture; GERD (gastroesophageal reflux disease); History of gout (1970's); Hyperlipidemia; Hypertension; Movement disorder; Osteoarthritis; Parkinson's disease ([place]); Scoliosis; Situational depression; Type II diabetes mellitus ([place]); and UTI (lower urinary tract infection). here with:  1. Parkinson's disease 2. Abnormality of gait  Patient will continue on current dose of Sinemet. I will refer the patient for physical therapy. The patient is advised that she should use an assistive device when ambulating. She voiced understanding. She will follow-up in May with Dr. Wynetta Emery, MSN, NP-C 05/05/2016, 9:16 AM [facility] [address], [place], [place] [address] [phone]

## 2016-05-05 NOTE — Patient Instructions (Signed)
Referral to PT for gait and balance training Continue Sinemet  If your symptoms worsen or you develop new symptoms please let us know.

## 2016-05-14 ENCOUNTER — Telehealth: Payer: Self-pay | Admitting: *Deleted

## 2016-05-14 ENCOUNTER — Encounter: Payer: Self-pay | Admitting: Family Medicine

## 2016-05-14 ENCOUNTER — Ambulatory Visit (INDEPENDENT_AMBULATORY_CARE_PROVIDER_SITE_OTHER): Payer: Medicare HMO | Admitting: Family Medicine

## 2016-05-14 VITALS — BP 150/92 | HR 102 | Temp 98.4°F | Ht 64.0 in | Wt 183.0 lb

## 2016-05-14 DIAGNOSIS — N183 Chronic kidney disease, stage 3 unspecified: Secondary | ICD-10-CM

## 2016-05-14 DIAGNOSIS — R296 Repeated falls: Secondary | ICD-10-CM | POA: Diagnosis not present

## 2016-05-14 DIAGNOSIS — E1122 Type 2 diabetes mellitus with diabetic chronic kidney disease: Secondary | ICD-10-CM

## 2016-05-14 DIAGNOSIS — M25562 Pain in left knee: Secondary | ICD-10-CM

## 2016-05-14 DIAGNOSIS — H9319 Tinnitus, unspecified ear: Secondary | ICD-10-CM | POA: Diagnosis not present

## 2016-05-14 DIAGNOSIS — E119 Type 2 diabetes mellitus without complications: Secondary | ICD-10-CM | POA: Diagnosis not present

## 2016-05-14 DIAGNOSIS — E538 Deficiency of other specified B group vitamins: Secondary | ICD-10-CM

## 2016-05-14 MED ORDER — ATORVASTATIN CALCIUM 20 MG PO TABS: 20.0000 mg | ORAL_TABLET | Freq: Every day | ORAL | 3 refills | Status: DC

## 2016-05-14 MED ORDER — CYANOCOBALAMIN 1000 MCG/ML IJ SOLN
1000.0000 ug | Freq: Once | INTRAMUSCULAR | Status: AC
  Administered 2016-05-14: 1000 ug via INTRAMUSCULAR

## 2016-05-14 MED ORDER — OMEPRAZOLE 40 MG PO CPDR: DELAYED_RELEASE_CAPSULE | ORAL | 3 refills | Status: DC

## 2016-05-14 MED ORDER — METHYLPREDNISOLONE ACETATE 40 MG/ML IJ SUSP
40.0000 mg | Freq: Once | INTRAMUSCULAR | Status: AC
  Administered 2016-05-14: 40 mg via INTRAMUSCULAR

## 2016-05-14 MED ORDER — OLMESARTAN MEDOXOMIL 5 MG PO TABS
10.0000 mg | ORAL_TABLET | Freq: Every day | ORAL | 3 refills | Status: DC
Start: 2016-05-14 — End: 2016-09-30

## 2016-05-14 NOTE — Progress Notes (Signed)
    CHIEF COMPLAINT / HPI:   #1. Left knee pain. Would like corticosteroid injection #2. Falls: Recently saw her neurologist and they have we ordered physical therapy for gait training for her. She's had multiple falls at home. #3. She has reconsidered and would like to retry the cholesterol medication.  REVIEW OF SYSTEMS:  See history of present illness above. Additional pertinent review of systems is negative for any increase in severity of her swallowing problems. Her appetite has been good. Her weight has not changed. Her mood has been good without any unusual depressive symptoms. She's had no increased frequency of urination, no increased thirst. She's taking her medicines regularly without problem  OBJECTIVE:  Vital signs are reviewed.    GENERALl: Well developed, well nourished, in no acute distress. HEENT: PERRLA, EOMI, sclerae are nonicteric NECK: Supple, FROM, without lymphadenopathy.  THYROID: normal without nodularity CAROTID ARTERIES: without bruits LUNGS: clear to auscultation bilaterally. No wheezes or rales. Normal respiratory effort HEART: Regular rate and rhythm, no murmurs. Distal pulses are bilaterally symmetrical, 2+. ABDOMEN: soft with positive bowel sounds. No masses noted MSK: MOE x 4. Normal muscle strength, bulk and tone. SKIN no rash. Normal temperature. NEURO: Resting tremor evident of head, hands. She has difficulty rising from a chair without some assistance. She has cogwheel rigidity bilateral upper extremity. Gait is altered with short stride length. Wide-based. Moderately steady gait..   INJECTION: Patient was given informed consent, signed copy in the chart. Appropriate time out was taken. Area prepped and draped in usual sterile fashion. 1 cc of methylprednisolone 40 mg/ml plus  4 cc of 1% lidocaine without epinephrine was injected into the LEFT KNEE using a(n) anterior medial approach. The patient tolerated the procedure well. There were no  complications. Post procedure instructions were given.   ASSESSMENT / PLAN: Please see problem oriented charting for details

## 2016-05-14 NOTE — Assessment & Plan Note (Signed)
Corticosteroid injection into her left knee today.

## 2016-05-14 NOTE — Assessment & Plan Note (Signed)
Increasing her Benicar by 5 mg daily or better blood pressure control. I like to see her back in 4-6 weeks for follow-up of blood pressure at that point I would like to recheck her creatinine.

## 2016-05-14 NOTE — Telephone Encounter (Signed)
Prior Authorization received from [place] for Olmesartan 5 mg. PA form completed and faxed to [facility] for review.  Review process could take 24-72 hours to complete.  Derl Barrow, RN

## 2016-05-14 NOTE — Assessment & Plan Note (Signed)
She continues to have tinnitus.

## 2016-05-14 NOTE — Assessment & Plan Note (Signed)
I agree with physical therapy refresher course for gait training. She did extremely well with that last time. Her neurologist is handling her Parkinson's medications. Her psychiatrist's a placed her on gabapentin for questionable reasons, she thinks maybe for mood stabilization. I think that may be contributing so we discussed at length today. I like her to either discontinue totally or decrease it to 1 dose at night.

## 2016-05-14 NOTE — Patient Instructions (Addendum)
INCREASE your Benicar (olmesarton) to two tabs a day. You can take them together or separate  Decrease the gabapentin to one at night or discontinue it totally--it may be contributing to your falls  I am restarting you on a statin I will send you a note about your blood work Let me see you in about three months for a check up

## 2016-05-14 NOTE — Telephone Encounter (Signed)
PA for Olmesartan Medoxomil 5 mg was approved via [employer] until 05/14/2017.  Derl Barrow, RN

## 2016-05-14 NOTE — Addendum Note (Signed)
Addended byDorcas Mcmurray L on: 05/14/2016 05:01 PM   Modules accepted: Orders

## 2016-05-14 NOTE — Assessment & Plan Note (Signed)
Restart cholesterol medicine

## 2016-05-15 ENCOUNTER — Ambulatory Visit: Payer: Medicare HMO | Attending: Adult Health | Admitting: Physical Therapy

## 2016-05-15 DIAGNOSIS — R2689 Other abnormalities of gait and mobility: Secondary | ICD-10-CM | POA: Diagnosis not present

## 2016-05-15 DIAGNOSIS — R2681 Unsteadiness on feet: Secondary | ICD-10-CM

## 2016-05-15 DIAGNOSIS — M6281 Muscle weakness (generalized): Secondary | ICD-10-CM | POA: Insufficient documentation

## 2016-05-15 DIAGNOSIS — R293 Abnormal posture: Secondary | ICD-10-CM | POA: Diagnosis not present

## 2016-05-15 NOTE — Patient Instructions (Signed)
Sit to Stand Transfers:  1. Scoot out to the edge of the chair 2. Place your feet flat on the floor, shoulder width apart.  Make sure your feet are tucked just under your knees. 3. Lean forward (nose over toes) with momentum, and stand up tall with your best posture.  If you need to use your arms, use them as a quick boost up to stand. 4. If you are in a low or soft chair, you can lean back and then forward up to stand, in order to get more momentum. 5. Once you are standing, make sure you are looking ahead and standing tall.  To sit down:  1. Back up until you feel the chair behind your legs. 2. Bend at you hips, reaching  Back for you chair, if needed, then slowly squat to sit down on your chair.  -When you are using the walker, keep using the walker as you turn to sit.  Before you sit down, lock the brakes. -When you stand up, push up from the chair and then reach up for the walker and unlock the brakes to start walking.

## 2016-05-16 NOTE — Therapy (Signed)
[facility] [address] [place], [place], [address] Phone: [phone]   Fax:  [phone]  Physical Therapy Evaluation  Patient Details  Name: [name] MRN: [id number] Date of Birth: 21-Oct-1945 Referring Provider: Ward Givens, NP (Dr. Leta Baptist)  Encounter Date: 05/15/2016      PT End of Session - 05/16/16 0953    Visit Number 1   Number of Visits 17   Date for PT Re-Evaluation 07/16/16   Authorization Type [employer] Medicare-GCODE required every 10th visit   PT Start Time 0803   PT Stop Time 0845   PT Time Calculation (min) 42 min   Activity Tolerance Patient tolerated treatment well   Behavior During Therapy [facility] for tasks assessed/performed      Past Medical History:  Diagnosis Date  . Allergy   . Anxiety   . Arthritis    "knees, back" (06/29/2014)  . Chronic bronchitis ([place])    "get it q yr"  . Chronic mid back pain   . Cognitive impairment 12/11/2013  . Complication of anesthesia 07/2008   "hard to get me woke up when I had my knee replaced; they said they had to bring me back"  . Esophageal stricture   . GERD (gastroesophageal reflux disease)   . History of gout 1970's  . Hyperlipidemia   . Hypertension   . Movement disorder   . Osteoarthritis   . Parkinson's disease ([place])   . Scoliosis   . Situational depression   . Type II diabetes mellitus ([place])   . UTI (lower urinary tract infection)     Past Surgical History:  Procedure Laterality Date  . ABDOMINAL HYSTERECTOMY    . CHOLECYSTECTOMY OPEN    . COLON SURGERY    . DILATION AND CURETTAGE OF UTERUS    . ESOPHAGOGASTRIC FUNDOPLASTY     some type "esoph surgery" per pt  . JOINT REPLACEMENT    . TOTAL KNEE ARTHROPLASTY Right 07/2008  . TUBAL LIGATION      There were no vitals filed for this visit.       Subjective Assessment - 05/15/16 0806    Subjective Pt reports having 3 falls since November.  One fall was tripping on  grass near sidewalk, one fall tripping up the steps into the house, third fall was slipping down steps out of house.  She feels that her feet get twisted.  She needs assistance getting up from floor.  She has new rollator walker, but hasn't used alot.   Patient is accompained by: Family member  Husband   Pertinent History See EPIC notes for [place]   Patient Stated Goals Pt's goal for therapy is to improve confidence for walking to get back out of the house again.   Currently in Pain? Yes   Pain Score 5    Pain Location Shoulder   Pain Orientation Right;Left   Pain Descriptors / Indicators Aching   Pain Type Acute pain   Pain Onset More than a month ago  from fall at rest home   Pain Frequency Intermittent   Aggravating Factors  Position of sleep worsen pain   Pain Relieving Factors Heat            [facility] PT Assessment - 05/15/16 0815      Assessment   Medical Diagnosis Parkinson's disease   Referring Provider Ward Givens, NP  Dr. Leta Baptist   Onset Date/Surgical Date --  at least 3 falls since November 2016  Precautions   Precautions Fall  Not driving (does not have license)     Balance Screen   Has the patient fallen in the past 6 months Yes   How many times? 5   Has the patient had a decrease in activity level because of a fear of falling?  Yes   Is the patient reluctant to leave their home because of a fear of falling?  Yes     [place] Private residence   Living Arrangements Spouse/significant other   Available Help at Discharge Family   Type of [place] to enter   Entrance Stairs-Number of Steps 4   Entrance Stairs-Rails Right;Left;Cannot reach both   [place] One level   [place] - 4 wheels;Walker - standard     Prior Function   Level of Independence Needs assistance with ADLs     Posture/Postural Control   Posture/Postural Control Postural limitations   Postural Limitations Rounded  Shoulders;Forward head     ROM / Strength   AROM / PROM / Strength Strength     Strength   Overall Strength Comments Grossly tested at least 4/5 bilateral lower extremity strengthening     Transfers   Transfers Sit to Stand;Stand to Sit   Sit to Stand 6: Modified independent (Device/Increase time);Without upper extremity assist;From chair/3-in-1   Five time sit to stand comments  18.22   Stand to Sit 6: Modified independent (Device/Increase time);Without upper extremity assist;To chair/3-in-1   Comments With turning to sit, pt leaves rollator walker and walks to chair to sit without assistive device     Ambulation/Gait   Ambulation/Gait Yes   Ambulation/Gait Assistance 5: Supervision   Ambulation/Gait Assistance Details With no device, pt demo decr. step length, decreased foot clearance, decreased arm swing bilaterally, with forward flexed posture, looking at ground.  With use of rollator, pt demo improved posture, but continued decreased foot clearance, decreased heel strike with gait.   Ambulation Distance (Feet) 120 Feet   Assistive device None;Rollator   Gait Pattern Decreased arm swing - right;Decreased arm swing - left;Step-through pattern;Decreased step length - left;Decreased step length - right;Shuffle;Poor foot clearance - left;Poor foot clearance - right  forward flexed posture   Ambulation Surface Level;Indoor   Gait velocity 15.85 sec with rollator (2.07 ft/sec); 14.16 sec no device (2.32 ft/sec)   Stairs Yes   Stairs Assistance 5: Supervision   Stair Management Technique One rail Right;Alternating pattern;Step to pattern  alt pattern ascending, step-to pattern descending   Number of Stairs 4   Height of Stairs 6   Gait Comments Pt ascends/descends steps sideways.  When ascending steps, she does not have full foot placement on steps.     Standardized Balance Assessment   Standardized Balance Assessment Timed Up and Go Test     Timed Up and Go Test   Normal TUG  (seconds) 21.43  rollator (leaves rollator to turn and sit)   Cognitive TUG (seconds) 26.85                   [facility] Adult PT Treatment/Exercise - 05/15/16 0815      Transfers   Number of Reps Other reps (comment)  3 reps   Transfer Cueing Transfer training at end of eval:  sit<>stand and turning to sit with cues for hand placement, locking brakes, positioning of walker.  Adjusted rollator to correct height for patient.  PT Education - 05/16/16 0950    Education provided Yes   Education Details Pt and husband educated in transfer training, including locking of rollator brakes, hand placement, and walker placement.  Adjusted walker to proper height   Person(s) Educated Patient;Spouse   Methods Explanation;Demonstration;Handout;Verbal cues   Comprehension Verbalized understanding;Returned demonstration;Verbal cues required;Need further instruction          PT Short Term Goals - 05/16/16 1006      PT SHORT TERM GOAL #1   Title Pt will perform HEP for balance, transfers, gait with husband's supervision.  TARGET 06/15/16   Time 4   Period Weeks   Status New     PT SHORT TERM GOAL #2   Title Pt will improve 5x sit<>stand to less than or equal to 15 seconds for improved efficiency and safety with transfers.   Time 4   Period Weeks   Status New     PT SHORT TERM GOAL #3   Title Pt will improve TUG score to less than or equal to 18 seconds for decreased fall risk.   Time 4   Period Weeks   Status New     PT SHORT TERM GOAL #4   Title Berg Balance test to be assessed, with pt improving by at least 5 points for decreased fall risk.   Time 4   Period Weeks   Status New     PT SHORT TERM GOAL #5   Title Pt will negotiate 4 steps, at least 4 reps, modified independently with one handrail, for improved safety with gait.   Time 8   Period Weeks   Status New           PT Long Term Goals - 05/16/16 1008      PT LONG TERM GOAL #1   Title  Pt/husband will verbalize understanding of fall prevention in home environment.  TARGET 07/16/16   Time 8   Period Weeks   Status New     PT LONG TERM GOAL #2   Title Pt will improve 5x sit<>stand score to less than or equal to 12 seconds for improved efficiency and safety with trasnfers.     Time 8   Period Weeks   Status New     PT LONG TERM GOAL #3   Title Pt will imrpove TUG cognitive score to less than or equal to 18 seconds for decreased fall risk.   Time 8   Period Weeks   Status New     PT LONG TERM GOAL #4   Title Pt will improve gait velocity to at least 2.62 ft/sec for improved community ambulator status/improved efficiency with gait.     Time 8   Period Weeks   Status New     PT LONG TERM GOAL #5   Title Pt will perform floor>stand transfer with minimal assistance/UE support for improved fall recovery.   Time 8   Period Weeks   Status New               Plan - 05/16/16 0955    Clinical Impression Statement Pt is a 71 year old female with history of Parkinson's disease, with recent history of increased falls, who presents to OP PT for evaluation.  She presents with postural instability, abnormal posture, decreased balance, bradykinesia/slowed movement patterns with transfers, decreased safety/independence with gait.  Pt reports decreased confidence with gait and mobility in and outside of home due to increased fear of falling.  Pt is  at fall risk per TUG scores.  Pt would benefit from skilled physical therapy to address the above stated deficits to improve functional mobility and decreased fall risk.   Rehab Potential Good   PT Frequency 2x / week   PT Duration 8 weeks  plus eval   PT Treatment/Interventions ADLs/Self Care Home Management;Functional mobility training;Gait training;Stair training;DME Instruction;Therapeutic activities;Therapeutic exercise;Balance training;Neuromuscular re-education;Patient/family education   PT Next Visit Plan Review transfers, use  of rollator with sit<>stand; perform Berg Balance test and add to goal; work on posture, balance, gait training with Rollator   Consulted and Agree with Plan of Care Patient;Family member/caregiver   Family Member Consulted Husband      Patient will benefit from skilled therapeutic intervention in order to improve the following deficits and impairments:  Abnormal gait, Decreased balance, Decreased mobility, Decreased knowledge of use of DME, Decreased safety awareness, Decreased strength, Difficulty walking, Postural dysfunction  Visit Diagnosis: Other abnormalities of gait and mobility  Unsteadiness on feet  Abnormal posture  Muscle weakness (generalized)      G-Codes - 2016/06/04 1012    Functional Assessment Tool Used 5x sit<>stand 18.22 sec, gait velocity 2.07 ft/sec, TUG 21.43 sec, TUG cog 26.85 sec; 5 falls in past 6 months   Functional Limitation Mobility: Walking and moving around   Mobility: Walking and Moving Around Current Status [phone]) At least 40 percent but less than 60 percent impaired, limited or restricted   Mobility: Walking and Moving Around Goal Status [id number]) At least 20 percent but less than 40 percent impaired, limited or restricted       Problem List Patient Active Problem List   Diagnosis Date Noted  . Frequent falls 05/14/2016  . Tinnitus 02/15/2015  . CKD stage 3 due to type 2 diabetes mellitus ([place]) 02/15/2015  . Chronic pain syndrome 06/15/2014  . Left knee pain 05/17/2014  . Urticaria, idiopathic 12/21/2013  . Cognitive impairment 12/11/2013  . Dysphagia, unspecified(787.20) 06/02/2013  . Parkinson's disease ([place]) 01/18/2013  . 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 Episodic substance abuse 09/20/2010  . Esophageal abnormality 08/12/2010  . ARTHRITIS, BACK 03/28/2009  . SCOLIOSIS 03/28/2009  . ROTATOR CUFF SYNDROME 01/24/2009  . CARPAL TUNNEL SYNDROME, LEFT 01/05/2008  . Diabetes mellitus without complication ([place]) 99991111   . Hyperlipidemia 08/05/2006  . Essential hypertension 08/05/2006  . Osteoarthritis of multiple joints 08/04/2006  . Major depressive disorder, recurrent episode ([place]) 06/11/2006    Eugene Isadore W. 04-Jun-2016, 10:13 AM Frazier Butt., PT [facility] [address] [place], [place], [address] Phone: [phone]   Fax:  [phone]  Name: [name] MRN: [id number] Date of Birth: 1945/04/20

## 2016-05-16 NOTE — Addendum Note (Signed)
Addended by: Frazier Butt on: 05/16/2016 10:16 AM   Modules accepted: Orders

## 2016-05-19 DIAGNOSIS — F3341 Major depressive disorder, recurrent, in partial remission: Secondary | ICD-10-CM | POA: Diagnosis not present

## 2016-05-20 ENCOUNTER — Ambulatory Visit: Payer: Medicare HMO | Admitting: Physical Therapy

## 2016-05-20 DIAGNOSIS — R2689 Other abnormalities of gait and mobility: Secondary | ICD-10-CM

## 2016-05-20 DIAGNOSIS — M6281 Muscle weakness (generalized): Secondary | ICD-10-CM | POA: Diagnosis not present

## 2016-05-20 DIAGNOSIS — R2681 Unsteadiness on feet: Secondary | ICD-10-CM

## 2016-05-20 DIAGNOSIS — R293 Abnormal posture: Secondary | ICD-10-CM

## 2016-05-20 NOTE — Therapy (Signed)
[facility] [address] [place], [place], [address] Phone: [phone]   Fax:  [phone]  Physical Therapy Treatment  Patient Details  Name: [name] MRN: [id number] Date of Birth: 08-Oct-1945 Referring Provider: Ward Givens, NP (Dr. Leta Baptist)  Encounter Date: 05/20/2016      PT End of Session - 05/20/16 1656    Visit Number 2   Number of Visits 17   Date for PT Re-Evaluation 07/16/16   Authorization Type [employer] Medicare-GCODE required every 10th visit   PT Start Time 1535   PT Stop Time 1620   PT Time Calculation (min) 45 min   Equipment Utilized During Treatment Gait belt   Activity Tolerance Patient tolerated treatment well   Behavior During Therapy [facility] for tasks assessed/performed      Past Medical History:  Diagnosis Date  . Allergy   . Anxiety   . Arthritis    "knees, back" (06/29/2014)  . Chronic bronchitis ([place])    "get it q yr"  . Chronic mid back pain   . Cognitive impairment 12/11/2013  . Complication of anesthesia 07/2008   "hard to get me woke up when I had my knee replaced; they said they had to bring me back"  . Esophageal stricture   . GERD (gastroesophageal reflux disease)   . History of gout 1970's  . Hyperlipidemia   . Hypertension   . Movement disorder   . Osteoarthritis   . Parkinson's disease ([name])   . Scoliosis   . Situational depression   . Type II diabetes mellitus ([place])   . UTI (lower urinary tract infection)     Past Surgical History:  Procedure Laterality Date  . ABDOMINAL HYSTERECTOMY    . CHOLECYSTECTOMY OPEN    . COLON SURGERY    . DILATION AND CURETTAGE OF UTERUS    . ESOPHAGOGASTRIC FUNDOPLASTY     some type "esoph surgery" per pt  . JOINT REPLACEMENT    . TOTAL KNEE ARTHROPLASTY Right 07/2008  . TUBAL LIGATION      There were no vitals filed for this visit.      Subjective Assessment - 05/20/16 1538    Subjective Reports no falls.    Patient is accompained by: Family member  Husband   Pertinent History See EPIC notes for [place]   Patient Stated Goals Pt's goal for therapy is to improve confidence for walking to get back out of the house again.   Currently in Pain? Yes   Pain Score 6    Pain Location Back   Pain Orientation Mid   Pain Descriptors / Indicators Sore;Other (Comment)  sharp   Pain Type Chronic pain   Pain Onset More than a month ago  from fall at rest home   Pain Frequency Intermittent   Pain Relieving Factors Heat, trying not to use medicine   Multiple Pain Sites No      Patient's pain monitored during session for tolerance to activities, however pain control not the focus of this episode of care.                      [facility] Adult PT Treatment/Exercise - 05/20/16 0001      Transfers   Transfers Sit to Stand;Stand to Sit   Sit to Stand 5: Supervision;With upper extremity assist;Without upper extremity assist;From bed;From chair/3-in-1   Sit to Stand Details Tactile cues for placement;Visual cues for safe use of DME/AE;Verbal cues for sequencing;Verbal  cues for safe use of DME/AE   Stand to Sit 5: Supervision;With upper extremity assist;Without upper extremity assist;To bed;To chair/3-in-1   Number of Reps 10 reps  throughout session for repetition in sequencing   Transfer Cueing Patient had not been instructed in proper use of rollator during transfers (per pt MD instructed her to get rollator after 3rd fall). Patient required constant cues (multi-modal with occasionally able to correct with subtle cues). Educated in proper transfer onto seat of rollator x 2.      Ambulation/Gait   Ambulation/Gait Assistance 5: Supervision   Ambulation/Gait Assistance Details with rollator; handles adjusted down 2 notches with pt better able to stay close to rollator. Educated in wide U turns (pt with great difficulty understanding the concept and became a bit frustrated). Educated in sharp turns in  tighter spaces and approaching to sit in chair. Pt attempted x 3 to "park" her RW off to the side and walk the remaining 2-3 ft without it.    Ambulation Distance (Feet) 110 Feet  x 4   Assistive device 4-wheeled walker   Gait Pattern Step-through pattern;Decreased step length - right;Decreased step length - left;Poor foot clearance - right;Poor foot clearance - left;Trunk flexed   Ambulation Surface Level;Indoor     Standardized Balance Assessment   Standardized Balance Assessment Berg Balance Test     Berg Balance Test   Sit to Stand Able to stand without using hands and stabilize independently   Standing Unsupported Able to stand safely 2 minutes   Sitting with Back Unsupported but Feet Supported on Floor or Stool Able to sit safely and securely 2 minutes   Stand to Sit Sits safely with minimal use of hands   Transfers Able to transfer safely, minor use of hands   Standing Unsupported with Eyes Closed Able to stand 10 seconds safely   Standing Ubsupported with Feet Together Able to place feet together independently and stand 1 minute safely   From Standing, Reach Forward with Outstretched Arm Can reach confidently >25 cm (10")   From Standing Position, Pick up Object from Floor Able to pick up shoe safely and easily   From Standing Position, Turn to Look Behind Over each Shoulder Looks behind one side only/other side shows less weight shift   Turn 360 Degrees Needs close supervision or verbal cueing   Standing Unsupported, Alternately Place Feet on Step/Stool Able to stand independently and safely and complete 8 steps in 20 seconds   Standing Unsupported, One Foot in Front Able to take small step independently and hold 30 seconds   Standing on One Leg Unable to try or needs assist to prevent fall   Total Score 46                PT Education - 05/20/16 1654    Education provided Yes   Education Details Educated on proper use of RW (pt and husband), especially during  transfers.    Person(s) Educated Patient;Spouse   Methods Explanation;Demonstration   Comprehension Verbalized understanding;Returned demonstration;Verbal cues required;Need further instruction          PT Short Term Goals - 05/20/16 1703      PT SHORT TERM GOAL #1   Title Pt will perform HEP for balance, transfers, gait with husband's supervision.  TARGET 06/15/16   Time 4   Period Weeks   Status New     PT SHORT TERM GOAL #2   Title Pt will improve 5x sit<>stand to less than or  equal to 15 seconds for improved efficiency and safety with transfers.   Time 4   Period Weeks   Status New     PT SHORT TERM GOAL #3   Title Pt will improve TUG score to less than or equal to 18 seconds for decreased fall risk.   Time 4   Period Weeks   Status New     PT SHORT TERM GOAL #4   Title Berg Balance test to be assessed, with pt improving by at least 5 points for decreased fall risk.   Baseline 2/6 Berg 46/56; goal = 51   Time 4   Period Weeks   Status New     PT SHORT TERM GOAL #5   Title Pt will negotiate 4 steps, at least 4 reps, modified independently with one handrail, for improved safety with gait.   Time 8   Period Weeks   Status New           PT Long Term Goals - 05/20/16 1703      PT LONG TERM GOAL #1   Title Pt/husband will verbalize understanding of fall prevention in home environment.  TARGET 07/16/16   Time 8   Period Weeks   Status New     PT LONG TERM GOAL #2   Title Pt will improve 5x sit<>stand score to less than or equal to 12 seconds for improved efficiency and safety with trasnfers.     Time 8   Period Weeks   Status New     PT LONG TERM GOAL #3   Title Pt will imrpove TUG cognitive score to less than or equal to 18 seconds for decreased fall risk.   Time 8   Period Weeks   Status New     PT LONG TERM GOAL #4   Title Pt will improve gait velocity to at least 2.62 ft/sec for improved community ambulator status/improved efficiency with gait.      Time 8   Period Weeks   Status New     PT LONG TERM GOAL #5   Title Pt will perform floor>stand transfer with minimal assistance/UE support for improved fall recovery.   Time 8   Period Weeks   Status New               Plan - 05/20/16 1657    Clinical Impression Statement Patient demonstrated poor memory throughout session as she required repeated cues for proper/safe use of rollator. She showed awareness of her poor memory "I know I have some dementia already." At end of session, husband included in education. Patient is very motivated to decr her fall risk and with husband's assistance for repetition and recall, anticipate she can make good progress. Berg Balance Assessment completed with score of 46/56.    Rehab Potential Good   PT Frequency 2x / week   PT Duration 8 weeks  plus eval   PT Treatment/Interventions ADLs/Self Care Home Management;Functional mobility training;Gait training;Stair training;DME Instruction;Therapeutic activities;Therapeutic exercise;Balance training;Neuromuscular re-education;Patient/family education   PT Next Visit Plan *be sure to include husband in education (pt very poor memory); Review transfers, use of rollator with sit<>stand; work on posture, balance, gait training with Rollator and start HEP   PT Home Exercise Plan 2/6 practice proper sequencing with transfers and rollator   Consulted and Agree with Plan of Care Patient;Family member/caregiver   Family Member Consulted Husband      Patient will benefit from skilled therapeutic intervention in order to improve the  following deficits and impairments:  Abnormal gait, Decreased balance, Decreased mobility, Decreased knowledge of use of DME, Decreased safety awareness, Decreased strength, Difficulty walking, Postural dysfunction  Visit Diagnosis: Other abnormalities of gait and mobility  Unsteadiness on feet  Abnormal posture  Muscle weakness (generalized)     Problem List Patient  Active Problem List   Diagnosis Date Noted  . Frequent falls 05/14/2016  . Tinnitus 02/15/2015  . CKD stage 3 due to type 2 diabetes mellitus ([place]) 02/15/2015  . Chronic pain syndrome 06/15/2014  . Left knee pain 05/17/2014  . Urticaria, idiopathic 12/21/2013  . Cognitive impairment 12/11/2013  . Dysphagia, unspecified(787.20) 06/02/2013  . Parkinson's disease ([place]) 01/18/2013  . 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 Episodic substance abuse 09/20/2010  . Esophageal abnormality 08/12/2010  . ARTHRITIS, BACK 03/28/2009  . SCOLIOSIS 03/28/2009  . ROTATOR CUFF SYNDROME 01/24/2009  . CARPAL TUNNEL SYNDROME, LEFT 01/05/2008  . Diabetes mellitus without complication ([place]) 99991111  . Hyperlipidemia 08/05/2006  . Essential hypertension 08/05/2006  . Osteoarthritis of multiple joints 08/04/2006  . Major depressive disorder, recurrent episode ([place]) 06/11/2006    Rexanne Mano, PT 05/20/2016, 5:06 PM  [facility] [address] [place], [place], [address] Phone: [phone]   Fax:  [phone]  Name: [name] MRN: [id number] Date of Birth: 1945-05-30

## 2016-05-22 ENCOUNTER — Other Ambulatory Visit: Payer: Self-pay | Admitting: Family Medicine

## 2016-05-22 NOTE — Telephone Encounter (Signed)
Pt needs a refill on tramadol. Pt uses [facility] on [address]. ep

## 2016-05-23 ENCOUNTER — Ambulatory Visit: Payer: Medicare HMO | Admitting: Physical Therapy

## 2016-05-23 NOTE — Telephone Encounter (Signed)
Dear [name] Team Can u call the tramadol in as below [facility]! [name]

## 2016-05-23 NOTE — Telephone Encounter (Signed)
CAlled in Rx to pharmacy per note below. Katharina Caper, Trinitee Horgan D, [place]

## 2016-05-27 ENCOUNTER — Ambulatory Visit: Payer: Medicare HMO | Admitting: Physical Therapy

## 2016-05-27 DIAGNOSIS — R2689 Other abnormalities of gait and mobility: Secondary | ICD-10-CM

## 2016-05-27 DIAGNOSIS — R2681 Unsteadiness on feet: Secondary | ICD-10-CM

## 2016-05-27 DIAGNOSIS — R293 Abnormal posture: Secondary | ICD-10-CM

## 2016-05-27 NOTE — Therapy (Signed)
[facility] [address] [place], [place], [address] Phone: [phone]   Fax:  [phone]  Patient Details  Name: [name] MRN: [id number] Date of Birth: 11/18/1945 Referring Provider:  [name], MD  Encounter Date: 05/27/2016   Patient arrived for session and was not feeling well. Stated her medicine makes her nauseous if she does not take it on time and with food. She was assisted to the bathroom. Attempted to initiate session and she stated she felt she would throw up and could not continue. Husband present. Provided emesis bag and offered for her to lie down in treatment room until she felt able to go home. She declined. Walked with patient and husband to the lobby with patient steady on her feet (using rollator).    Rexanne Mano 05/27/2016, 2:31 PM  [facility] [address] [place], [place], [address] Phone: [phone]   Fax:  [phone]

## 2016-05-28 NOTE — Progress Notes (Signed)
I reviewed note and agree with plan.   VIKRAM R. PENUMALLI, MD  Certified in Neurology, Neurophysiology and Neuroimaging  [facility] [address], [address] [place], [place] [address] [phone]   

## 2016-05-30 ENCOUNTER — Ambulatory Visit: Payer: Medicare HMO | Admitting: Physical Therapy

## 2016-05-30 DIAGNOSIS — R2689 Other abnormalities of gait and mobility: Secondary | ICD-10-CM

## 2016-05-30 DIAGNOSIS — R2681 Unsteadiness on feet: Secondary | ICD-10-CM | POA: Diagnosis not present

## 2016-05-30 DIAGNOSIS — M6281 Muscle weakness (generalized): Secondary | ICD-10-CM | POA: Diagnosis not present

## 2016-05-30 DIAGNOSIS — R293 Abnormal posture: Secondary | ICD-10-CM | POA: Diagnosis not present

## 2016-05-30 NOTE — Patient Instructions (Signed)
Sit to Stand Transfers:  1. Scoot out to the edge of the chair 2. Place your feet flat on the floor, shoulder width apart.  Make sure your feet are tucked just under your knees. 3. Lean forward (nose over toes) with momentum, and stand up tall with your best posture.  If you need to use your arms, use them as a quick boost up to stand. 4. If you are in a low or soft chair, you can lean back and then forward up to stand, in order to get more momentum. 5. Once you are standing, make sure you are looking ahead and standing tall.  To sit down:  1. Back up until you feel the chair behind your legs. 2. Bend at your hips, reaching  Back up for your chair, if needed, then slowly squat to sit down on your chair.

## 2016-05-31 NOTE — Therapy (Signed)
[facility] [address] [place], [place], [address] Phone: [phone]   Fax:  [phone]  Physical Therapy Treatment  Patient Details  Name: [name] MRN: [id number] Date of Birth: 1945-06-29 Referring Provider: Ward Givens, NP (Dr. Leta Baptist)  Encounter Date: 05/30/2016      PT End of Session - 05/31/16 1426    Visit Number 3   Number of Visits 17   Date for PT Re-Evaluation 07/16/16   Authorization Type [employer] H[employer] Medicare-GCODE required every 10th visit   PT Start Time 1105   PT Stop Time 1143   PT Time Calculation (min) 38 min   Equipment Utilized During Treatment Gait belt   Activity Tolerance Patient tolerated treatment well   Behavior During Therapy Anxious      Past Medical History:  Diagnosis Date  . Allergy   . Anxiety   . Arthritis    "knees, back" (06/29/2014)  . Chronic bronchitis ([place])    "get it q yr"  . Chronic mid back pain   . Cognitive impairment 12/11/2013  . Complication of anesthesia 07/2008   "hard to get me woke up when I had my knee replaced; they said they had to bring me back"  . Esophageal stricture   . GERD (gastroesophageal reflux disease)   . History of gout 1970's  . Hyperlipidemia   . Hypertension   . Movement disorder   . Osteoarthritis   . Parkinson's disease ([place])   . Scoliosis   . Situational depression   . Type II diabetes mellitus ([place])   . UTI (lower urinary tract infection)     Past Surgical History:  Procedure Laterality Date  . ABDOMINAL HYSTERECTOMY    . CHOLECYSTECTOMY OPEN    . COLON SURGERY    . DILATION AND CURETTAGE OF UTERUS    . ESOPHAGOGASTRIC FUNDOPLASTY     some type "esoph surgery" per pt  . JOINT REPLACEMENT    . TOTAL KNEE ARTHROPLASTY Right 07/2008  . TUBAL LIGATION      There were no vitals filed for this visit.      Subjective Assessment - 05/30/16 1105    Subjective Feels better than the other day.  Reports no  falls.   Patient is accompained by: Family member  Husband   Pertinent History See EPIC notes for [facility]   Patient Stated Goals Pt's goal for therapy is to improve confidence for walking to get back out of the house again.   Currently in Pain? Yes   Pain Score 5    Pain Location Back   Pain Orientation Lower   Pain Descriptors / Indicators Sore   Pain Type Chronic pain   Pain Onset More than a month ago  from fall at rest home   Pain Frequency Intermittent   Aggravating Factors  Moving aggravates   Pain Relieving Factors sitting down alleviates                         [facility] Adult PT Treatment/Exercise - 05/30/16 1111      Transfers   Transfers Sit to Stand;Stand to Sit   Sit to Stand 5: Supervision;With upper extremity assist;Without upper extremity assist;From bed;From chair/3-in-1   Sit to Stand Details Tactile cues for placement;Visual cues for safe use of DME/AE;Verbal cues for sequencing;Verbal cues for safe use of DME/AE   Sit to Stand Details (indicate cue type and reason) Verbal cues for technique of sit<>stand  transfer.  As surfaces progressively get lower, pt needs additional tactile cues and min assist to slowly lower descent into sitting (pt appears to be very anxious and fearful of falling with lower surface transfer practice)   Stand to Sit 5: Supervision;With upper extremity assist;Without upper extremity assist;To bed;To chair/3-in-1   Number of Reps 10 reps;Other sets (comment)  from 20", 18", 16" chairs   Transfer Cueing Practiced short distance gait to other chair surfaces, with turns to sit; pt needs verbal and at times manual cueing to steer walker to turn fully to sit with proper technique.  Practiced sit<>stand x 1 rep at low surface with BOSU on top to simulate sofa; did not attempt again, as pt reports very anxious about this task.     Ambulation/Gait   Ambulation/Gait Yes   Ambulation/Gait Assistance 5: Supervision;4: Min guard    Ambulation/Gait Assistance Details Multi-modal cues provided for slowed gait pace, increased step length and increased foot clearance.  Pt able to maintain for 20-30 ft, then reverts back to shuffling pattern with decreased foot clearance and decreased step length.   Ambulation Distance (Feet) 310 Feet  110 ft x 2   Assistive device Rollator   Gait Pattern Step-through pattern;Decreased step length - right;Decreased step length - left;Poor foot clearance - right;Poor foot clearance - left;Trunk flexed  Hastening of gait   Ambulation Surface Level;Indoor   Gait Comments With simple conversation tasks, pt hastens gait pattern, decr. foot clearance, decreased step length                PT Education - 05/31/16 1425    Education provided Yes   Education Details Reviewed sit<>stand transfers and provided handout   Person(s) Educated Patient;Spouse   Methods Explanation;Demonstration;Verbal cues;Handout   Comprehension Verbalized understanding;Returned demonstration;Verbal cues required          PT Short Term Goals - 05/20/16 1703      PT SHORT TERM GOAL #1   Title Pt will perform HEP for balance, transfers, gait with husband's supervision.  TARGET 06/15/16   Time 4   Period Weeks   Status New     PT SHORT TERM GOAL #2   Title Pt will improve 5x sit<>stand to less than or equal to 15 seconds for improved efficiency and safety with transfers.   Time 4   Period Weeks   Status New     PT SHORT TERM GOAL #3   Title Pt will improve TUG score to less than or equal to 18 seconds for decreased fall risk.   Time 4   Period Weeks   Status New     PT SHORT TERM GOAL #4   Title Berg Balance test to be assessed, with pt improving by at least 5 points for decreased fall risk.   Baseline 2/6 Berg 46/56; goal = 51   Time 4   Period Weeks   Status New     PT SHORT TERM GOAL #5   Title Pt will negotiate 4 steps, at least 4 reps, modified independently with one handrail, for improved  safety with gait.   Time 8   Period Weeks   Status New           PT Long Term Goals - 05/20/16 1703      PT LONG TERM GOAL #1   Title Pt/husband will verbalize understanding of fall prevention in home environment.  TARGET 07/16/16   Time 8   Period Weeks   Status New  PT LONG TERM GOAL #2   Title Pt will improve 5x sit<>stand score to less than or equal to 12 seconds for improved efficiency and safety with trasnfers.     Time 8   Period Weeks   Status New     PT LONG TERM GOAL #3   Title Pt will imrpove TUG cognitive score to less than or equal to 18 seconds for decreased fall risk.   Time 8   Period Weeks   Status New     PT LONG TERM GOAL #4   Title Pt will improve gait velocity to at least 2.62 ft/sec for improved community ambulator status/improved efficiency with gait.     Time 8   Period Weeks   Status New     PT LONG TERM GOAL #5   Title Pt will perform floor>stand transfer with minimal assistance/UE support for improved fall recovery.   Time 8   Period Weeks   Status New               Plan - 05/31/16 1426    Clinical Impression Statement Skilled PT session focused on transfer training, with rollator, but also from progressively lower surfaces, to simulate low surfaces in home.  Pt becomes more anxious during transfer training activiites.  With gait activiites, pt has hastening of gait; she is able to slow pace iwth cues, but quickly reverts back to hastening/shuffling pattern.  Pt will continue to benefit from further skilled PT to address transfers, balance, and gait for improved functional mobility/decreased fall risk.   Rehab Potential Good   PT Frequency 2x / week   PT Duration 8 weeks  plus eval   PT Treatment/Interventions ADLs/Self Care Home Management;Functional mobility training;Gait training;Stair training;DME Instruction;Therapeutic activities;Therapeutic exercise;Balance training;Neuromuscular re-education;Patient/family education   PT  Next Visit Plan *be sure to include husband in education (pt very poor memory); Review transfers, use of rollator with sit<>stand; work on posture, balance, gait training with Rollator and start HEP   PT Home Exercise Plan 2/6 practice proper sequencing with transfers and rollator   Consulted and Agree with Plan of Care Patient;Family member/caregiver   Family Member Consulted Husband      Patient will benefit from skilled therapeutic intervention in order to improve the following deficits and impairments:  Abnormal gait, Decreased balance, Decreased mobility, Decreased knowledge of use of DME, Decreased safety awareness, Decreased strength, Difficulty walking, Postural dysfunction  Visit Diagnosis: Unsteadiness on feet  Other abnormalities of gait and mobility     Problem List Patient Active Problem List   Diagnosis Date Noted  . Frequent falls 05/14/2016  . Tinnitus 02/15/2015  . CKD stage 3 due to type 2 diabetes mellitus ([place]) 02/15/2015  . Chronic pain syndrome 06/15/2014  . Left knee pain 05/17/2014  . Urticaria, idiopathic 12/21/2013  . Cognitive impairment 12/11/2013  . Dysphagia, unspecified(787.20) 06/02/2013  . Parkinson's disease ([place]) 01/18/2013  . 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 Episodic substance abuse 09/20/2010  . Esophageal abnormality 08/12/2010  . ARTHRITIS, BACK 03/28/2009  . SCOLIOSIS 03/28/2009  . ROTATOR CUFF SYNDROME 01/24/2009  . CARPAL TUNNEL SYNDROME, LEFT 01/05/2008  . Diabetes mellitus without complication ([place]) 99991111  . Hyperlipidemia 08/05/2006  . Essential hypertension 08/05/2006  . Osteoarthritis of multiple joints 08/04/2006  . Major depressive disorder, recurrent episode ([place]) 06/11/2006    Nikita Humble W. 05/31/2016, 2:30 PM Frazier Butt., PT [facility] [address] [place], [place], [address] Phone: [phone]  Fax:  [phone]  Name:  [name] MRN: [id number] Date of Birth: 10/07/1945

## 2016-06-04 ENCOUNTER — Ambulatory Visit: Payer: Medicare HMO | Admitting: Physical Therapy

## 2016-06-06 ENCOUNTER — Ambulatory Visit: Payer: Medicare HMO | Admitting: Physical Therapy

## 2016-06-11 ENCOUNTER — Ambulatory Visit: Payer: Medicare HMO | Admitting: Physical Therapy

## 2016-06-13 ENCOUNTER — Ambulatory Visit: Payer: Medicare HMO | Attending: Adult Health | Admitting: Physical Therapy

## 2016-06-13 DIAGNOSIS — R2681 Unsteadiness on feet: Secondary | ICD-10-CM | POA: Diagnosis not present

## 2016-06-13 DIAGNOSIS — R2689 Other abnormalities of gait and mobility: Secondary | ICD-10-CM | POA: Insufficient documentation

## 2016-06-13 NOTE — Therapy (Signed)
[facility] [address] [place], [place], [address] Phone: [phone]   Fax:  [phone]  Physical Therapy Treatment  Patient Details  Name: [name] MRN: [id number] Date of Birth: Aug 27, 1945 Referring Provider: Ward Givens, NP (Dr. Leta Baptist)  Encounter Date: 06/13/2016      PT End of Session - 06/13/16 1221    Visit Number 4   Number of Visits 17   Date for PT Re-Evaluation 07/16/16   Authorization Type [employer] Medicare-GCODE required every 10th visit   PT Start Time 1148   PT Stop Time 1215   PT Time Calculation (min) 27 min   Equipment Utilized During Treatment Gait belt   Activity Tolerance Patient tolerated treatment well   Behavior During Therapy Anxious      Past Medical History:  Diagnosis Date  . Allergy   . Anxiety   . Arthritis    "knees, back" (06/29/2014)  . Chronic bronchitis ([facility])    "get it q yr"  . Chronic mid back pain   . Cognitive impairment 12/11/2013  . Complication of anesthesia 07/2008   "hard to get me woke up when I had my knee replaced; they said they had to bring me back"  . Esophageal stricture   . GERD (gastroesophageal reflux disease)   . History of gout 1970's  . Hyperlipidemia   . Hypertension   . Movement disorder   . Osteoarthritis   . Parkinson's disease ([place])   . Scoliosis   . Situational depression   . Type II diabetes mellitus ([facility])   . UTI (lower urinary tract infection)     Past Surgical History:  Procedure Laterality Date  . ABDOMINAL HYSTERECTOMY    . CHOLECYSTECTOMY OPEN    . COLON SURGERY    . DILATION AND CURETTAGE OF UTERUS    . ESOPHAGOGASTRIC FUNDOPLASTY     some type "esoph surgery" per pt  . JOINT REPLACEMENT    . TOTAL KNEE ARTHROPLASTY Right 07/2008  . TUBAL LIGATION      There were no vitals filed for this visit.      Subjective Assessment - 06/13/16 1152    Subjective (Pt has missed several appts due to upset  stomach); had a bad fall in parking lot of [place].  [place] on my face and my back hurts, but I haven't gone to the doctor.   Patient is accompained by: Family member  Husband   Pertinent History See EPIC notes for [facility]   Patient Stated Goals Pt's goal for therapy is to improve confidence for walking to get back out of the house again.   Currently in Pain? Yes   Pain Score 7    Pain Location Back   Pain Orientation Lower;Right;Left   Pain Descriptors / Indicators Aching;Sharp  sharp at times   Pain Type Acute pain  from fall on 05/31/16   Pain Onset More than a month ago  from fall at rest home   Aggravating Factors  moving/leaning to the side   Pain Relieving Factors position changes      Pt reports aching pain in low back musculature, sharp pain in center of back (higher than her typical pain).  The sharp pain in center of back is new since fall per patient report.                Self Care: -Discussed mechanism of pt's fall in parking lot of [place], including general safety tips and reminders of  safety with rollator walker. -Discussed fall prevention in home environment, and recommend husband's close supervision and cueing with outdoor, longer distance, and unfamiliar surface walking activities. -Discussed patient's back pain, and recommend pt follow up with Dr. Nori Riis prior to returning to PT.  Feel that since new pain in mid back is sharp, new onset since fall (?hyperextension based on forward fall on face), that PT needs to hold until pt sees physician.             PT Education - 06/13/16 1220    Education Details Provided fall prevention education handout; discussed fall prevention and mechanisms of recent fall; discussed need to follow-up with physicianr regarding back pain   Person(s) Educated Patient;Spouse   Methods Explanation;Handout   Comprehension Verbalized understanding          PT Short Term Goals - 06/13/16 1159      PT SHORT TERM GOAL  #1   Title Pt will perform HEP for balance, transfers, gait with husband's supervision.  TARGET 06/15/16   Time 4   Period Weeks   Status New     PT SHORT TERM GOAL #2   Title Pt will improve 5x sit<>stand to less than or equal to 15 seconds for improved efficiency and safety with transfers.   Time 4   Period Weeks   Status New     PT SHORT TERM GOAL #3   Title Pt will improve TUG score to less than or equal to 18 seconds for decreased fall risk.   Time 4   Period Weeks   Status New     PT SHORT TERM GOAL #4   Title Berg Balance test to be assessed, with pt improving by at least 5 points for decreased fall risk.   Baseline 2/6 Berg 46/56; goal = 51   Time 4   Period Weeks   Status New     PT SHORT TERM GOAL #5   Title Pt will negotiate 4 steps, at least 4 reps, modified independently with one handrail, for improved safety with gait.   Time 8   Period Weeks   Status New           PT Long Term Goals - 05/20/16 1703      PT LONG TERM GOAL #1   Title Pt/husband will verbalize understanding of fall prevention in home environment.  TARGET 07/16/16   Time 8   Period Weeks   Status New     PT LONG TERM GOAL #2   Title Pt will improve 5x sit<>stand score to less than or equal to 12 seconds for improved efficiency and safety with trasnfers.     Time 8   Period Weeks   Status New     PT LONG TERM GOAL #3   Title Pt will imrpove TUG cognitive score to less than or equal to 18 seconds for decreased fall risk.   Time 8   Period Weeks   Status New     PT LONG TERM GOAL #4   Title Pt will improve gait velocity to at least 2.62 ft/sec for improved community ambulator status/improved efficiency with gait.     Time 8   Period Weeks   Status New     PT LONG TERM GOAL #5   Title Pt will perform floor>stand transfer with minimal assistance/UE support for improved fall recovery.   Time 8   Period Weeks   Status New  Plan - 06/13/16 1221    Clinical  Impression Statement Pt had a fall on 05/31/16, with changes in back pain (sharp pain in center of back higher than normal c/o dull back pain low back musculature).  Ultimately, PT recommends that pt needs follow up with physician regarding new c/o back pain; await further therapy until pt has seen physician.   Rehab Potential Good   PT Frequency 2x / week   PT Duration 8 weeks  plus eval   PT Treatment/Interventions ADLs/Self Care Home Management;Functional mobility training;Gait training;Stair training;DME Instruction;Therapeutic activities;Therapeutic exercise;Balance training;Neuromuscular re-education;Patient/family education   PT Next Visit Plan *be sure to include husband in education (pt very poor memory); Need to assess goals and schedule additional appointments (pt to call clinic after she sees her physician regarding new back pain since fall)   PT Home Exercise Plan 2/6 practice proper sequencing with transfers and rollator   Consulted and Agree with Plan of Care Patient;Family member/caregiver   Family Member Consulted Husband      Patient will benefit from skilled therapeutic intervention in order to improve the following deficits and impairments:  Abnormal gait, Decreased balance, Decreased mobility, Decreased knowledge of use of DME, Decreased safety awareness, Decreased strength, Difficulty walking, Postural dysfunction  Visit Diagnosis: Other abnormalities of gait and mobility  Unsteadiness on feet     Problem List Patient Active Problem List   Diagnosis Date Noted  . Frequent falls 05/14/2016  . Tinnitus 02/15/2015  . CKD stage 3 due to type 2 diabetes mellitus ([facility]) 02/15/2015  . Chronic pain syndrome 06/15/2014  . Left knee pain 05/17/2014  . Urticaria, idiopathic 12/21/2013  . Cognitive impairment 12/11/2013  . Dysphagia, unspecified(787.20) 06/02/2013  . Parkinson's disease ([facility]) 01/18/2013  . 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 Episodic  substance abuse 09/20/2010  . Esophageal abnormality 08/12/2010  . ARTHRITIS, BACK 03/28/2009  . SCOLIOSIS 03/28/2009  . ROTATOR CUFF SYNDROME 01/24/2009  . CARPAL TUNNEL SYNDROME, LEFT 01/05/2008  . Diabetes mellitus without complication ([place]) 99991111  . Hyperlipidemia 08/05/2006  . Essential hypertension 08/05/2006  . Osteoarthritis of multiple joints 08/04/2006  . Major depressive disorder, recurrent episode ([place]) 06/11/2006    Lashawna Poche W. 06/13/2016, 12:26 PM  Frazier Butt., PT  [facility] [address] [place], [place], [address] Phone: [phone]   Fax:  [phone]  Name: MYRANDA OSIAS MRN: [id number] Date of Birth: [birth year]

## 2016-06-18 ENCOUNTER — Ambulatory Visit: Payer: Self-pay | Admitting: Family Medicine

## 2016-07-02 ENCOUNTER — Ambulatory Visit: Payer: Medicare HMO | Admitting: Family Medicine

## 2016-07-09 ENCOUNTER — Telehealth: Payer: Self-pay | Admitting: Family Medicine

## 2016-07-09 NOTE — Telephone Encounter (Signed)
Dear [name] Team Please let her know that she needs to be seen by someone tomorrow so we can do a urine culture. I do not want to treat over the phone as she has too many complicating issues and it would not be best practice. THANKS! [name]

## 2016-07-09 NOTE — Telephone Encounter (Signed)
Pt states she wants Dr. Nori Riis to call her regarding a bladder infection. ep

## 2016-07-10 ENCOUNTER — Ambulatory Visit (INDEPENDENT_AMBULATORY_CARE_PROVIDER_SITE_OTHER): Payer: Medicare HMO | Admitting: Family Medicine

## 2016-07-10 ENCOUNTER — Encounter: Payer: Self-pay | Admitting: Family Medicine

## 2016-07-10 VITALS — BP 120/90 | HR 102 | Temp 97.5°F | Ht 64.0 in | Wt 176.0 lb

## 2016-07-10 DIAGNOSIS — R443 Hallucinations, unspecified: Secondary | ICD-10-CM

## 2016-07-10 DIAGNOSIS — R399 Unspecified symptoms and signs involving the genitourinary system: Secondary | ICD-10-CM

## 2016-07-10 DIAGNOSIS — F3341 Major depressive disorder, recurrent, in partial remission: Secondary | ICD-10-CM | POA: Diagnosis not present

## 2016-07-10 LAB — POCT URINALYSIS DIP (MANUAL ENTRY)
Bilirubin, UA: NEGATIVE
Glucose, UA: NEGATIVE
Nitrite, UA: POSITIVE — AB
Spec Grav, UA: 1.025 (ref 1.030–1.035)
Urobilinogen, UA: 1 (ref ?–2.0)
pH, UA: 6 (ref 5.0–8.0)

## 2016-07-10 MED ORDER — CEPHALEXIN 500 MG PO CAPS: 500.0000 mg | ORAL_CAPSULE | Freq: Two times a day (BID) | ORAL | 0 refills | Status: AC

## 2016-07-10 NOTE — Patient Instructions (Signed)
Your urine looks like you have aUTI.  We will send in keflex.  We will let you know if we need to change antibiotics.  If your hallucinations return, please ask your doctor about changing your carbidopa dose.  Take care,  Dr Jerline Pain

## 2016-07-10 NOTE — Telephone Encounter (Signed)
Pt has an appt with Dr. Jerline Pain today at 3:15. Ottis Stain, CMA

## 2016-07-10 NOTE — Progress Notes (Signed)
    Subjective:  [name] is a 71 y.o. female who presents to the [facility] today with a chief complaint of UTI symptoms.   HPI:  UTI symptoms Symptoms started about a week ago. Symptoms include lower abdominal pain, some chills, increased urinary frequency, and a strong odor. No dysuria. Patient also with some hallucinations over the past week that she described as "friendly people." Patient is aware that these were hallucinations and not real. No audio hallucinations. No paranoia. No recent changes in medications.   ROS: Per HPI  PMH: Smoking history reviewed.   Objective:  Physical Exam: BP 120/90 (BP Location: Left Arm, Patient Position: Sitting, Cuff Size: Normal)   Pulse (!) 102   Temp 97.5 F (36.4 C) (Oral)   Ht 5\' 4"  (1.626 m)   Wt 176 lb (79.8 kg)   SpO2 93%   BMI 30.21 kg/m   Gen: NAD, resting comfortably CV: RRR with no murmurs appreciated Pulm: NWOB, CTAB with no crackles, wheezes, or rhonchi GI: Suprapubic area tender to palpation, otherwise soft and nondistended.  MSK: No CVA tenderness.  Skin: warm, dry Neuro: grossly normal, moves all extremities Psych: Normal affect and thought content  Results for orders placed or performed in visit on 07/10/16 (from the past 72 hour(s))  POCT urinalysis dipstick     Status: Abnormal   Collection Time: 07/10/16  3:15 PM  Result Value Ref Range   Color, UA yellow yellow   Clarity, UA clear clear   Glucose, UA negative negative   Bilirubin, UA negative negative   Ketones, POC UA trace (5) (A) negative   Spec Grav, UA 1.025 1.030 - 1.035   Blood, UA trace-intact (A) negative   pH, UA 6.0 5.0 - 8.0   Protein Ur, POC trace (A) negative   Urobilinogen, UA 1.0 Negative - 2.0   Nitrite, UA Positive (A) Negative   Leukocytes, UA Trace (A) Negative     Assessment/Plan:  UTI symptoms UA consistent with UTI. Will treat with keflex and send for urine culture. No signs of systemic illness. Return precautions  reviewed.  Hallucinations Possibly related to UTI, though also possibly related to patient's sinemet. No Liliputian hallucinations to suggest progression of Parkinson's dementia. Patient is aware that the hallucinations are not real and reports that they are not harmful or frightful - no further intervention at this time. If symptoms worsen or become frightful would consider adjusting dose of sinemet.   Algis Greenhouse. Jerline Pain, [facility] Resident PGY-3 07/10/2016 3:58 PM

## 2016-07-12 LAB — URINE CULTURE

## 2016-07-16 ENCOUNTER — Other Ambulatory Visit: Payer: Self-pay | Admitting: Family Medicine

## 2016-07-23 ENCOUNTER — Encounter: Payer: Self-pay | Admitting: Family Medicine

## 2016-07-23 ENCOUNTER — Ambulatory Visit (INDEPENDENT_AMBULATORY_CARE_PROVIDER_SITE_OTHER): Payer: Medicare HMO | Admitting: Family Medicine

## 2016-07-23 VITALS — BP 138/80 | HR 104 | Temp 97.7°F | Ht 64.0 in | Wt 179.4 lb

## 2016-07-23 DIAGNOSIS — R296 Repeated falls: Secondary | ICD-10-CM

## 2016-07-23 DIAGNOSIS — G20 Parkinson's disease: Secondary | ICD-10-CM | POA: Diagnosis not present

## 2016-07-23 DIAGNOSIS — F1910 Other psychoactive substance abuse, uncomplicated: Secondary | ICD-10-CM | POA: Diagnosis not present

## 2016-07-23 DIAGNOSIS — G20A1 Parkinson's disease without dyskinesia, without mention of fluctuations: Secondary | ICD-10-CM

## 2016-07-25 NOTE — Assessment & Plan Note (Signed)
Discussed using her assistive devices, rolling walker etc. to decrease her falls at home.

## 2016-07-25 NOTE — Progress Notes (Signed)
    CHIEF COMPLAINT / HPI: 1. Wants some "nerve pills" to help her sleep 2. Parkinson's issues are getting worse and she is having difficulty doing ADls without assistance. Husband is getting ready to have foot surgery and she is concerned about who can help her 3. Confusion about meds--she has a friend occasionally helping her fill her pill minder but is not totally comfortable w that.    REVIEW OF SYSTEMS:  worsening balance, 2 falls. Worsening stiffness and speech issues. Sleep difficulty.  OBJECTIVE:  Vital signs are reviewed.   GEN.: Well-developed female. Acute distress Neuro: Unsteady gait. She rises from a chair with some difficulty but without any assistance. She has a shuffling gait. HEENT: Oropharynx show dyskinesias. PSYCHIATRIC: Alert and oriented 4. Speech is difficult to understand secondary Parkinson's. Content is normal. Denies suicidal or homicidal ideation. Memory both recent remote seems intact. Judgment seems intact. CV: Regular rate and rhythm  ASSESSMENT / PLAN: Please see problem oriented charting for details

## 2016-07-25 NOTE — Assessment & Plan Note (Signed)
I again discussed with her absolute contraindication for her to use any type of sleeping aid such as benzodiazepines etc.

## 2016-07-25 NOTE — Assessment & Plan Note (Signed)
She does seem to have a lot more symptoms of problems with movement today secondary to Parkinson's. Will check and see if we can get her set up for personal care services at least during the time that her husband's can ambulate up from his foot surgery. Also check and see about home health nurse once a month get him under.

## 2016-08-01 ENCOUNTER — Telehealth: Payer: Self-pay | Admitting: Family Medicine

## 2016-08-01 DIAGNOSIS — G20 Parkinson's disease: Secondary | ICD-10-CM

## 2016-08-01 DIAGNOSIS — E1122 Type 2 diabetes mellitus with diabetic chronic kidney disease: Secondary | ICD-10-CM

## 2016-08-01 DIAGNOSIS — E119 Type 2 diabetes mellitus without complications: Secondary | ICD-10-CM

## 2016-08-01 DIAGNOSIS — N183 Chronic kidney disease, stage 3 unspecified: Secondary | ICD-10-CM

## 2016-08-01 NOTE — Telephone Encounter (Signed)
Pt called to find out where dr neal was on getting her home health services. Please advise

## 2016-08-04 NOTE — Telephone Encounter (Signed)
Cassadi Can u check and see if we can get someone to eval for personal care services for her. She has bad Parkinson's and her husband (who usually does most of things around house) is having foot surgery. It would be great if we could get a nurse out to fill her pill box once a month but I doubt she qualifies for that. THANKS! [name]

## 2016-08-05 ENCOUNTER — Other Ambulatory Visit: Payer: Self-pay

## 2016-08-05 ENCOUNTER — Encounter: Payer: Self-pay | Admitting: Physical Therapy

## 2016-08-05 ENCOUNTER — Other Ambulatory Visit: Payer: Self-pay | Admitting: Licensed Clinical Social Worker

## 2016-08-05 ENCOUNTER — Encounter: Payer: Self-pay | Admitting: Licensed Clinical Social Worker

## 2016-08-05 NOTE — Patient Outreach (Signed)
[facility]) Care Management  08/05/2016  [name] 1945-12-12 [id number]  Assessment- CSW received new referral on patient on 08/05/16. Referral from: MD office. Patient needs assistance with personal care and in home support/resources -spouse-main caregiver just had foot surgery and unable to assist with patient's care.   CSW completed outreach call to patient's daughter [name] (per patient's choice to arrange home visits through daughter.) [name] answered. HIPPA verifications provided. CSW introduced self, reason for call and of [facility] social work services. [name] shares that she is interested in personal care resources in order to assist patient. She shares that patient's spouse (her step father) usually assist her with cooking, bathing, etc but had foot surgery and is not suppose to be weight on foot for 6 weeks. 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 She shares that her brother goes to a day program daily for 6 hours so during the day she can assist patient with her needs, go grocery shopping for them and provide transportation to appointments. However, even though daughter is able to assist for the next 6 weeks she is not able to provide caregiver services long term. CSW provided education on personal care resources such as private pay agencies, [place], In Home Aide Services, Day Programs and Caregiver Support Groups. CSW scheduled home visit for 08/12/16 as patient did not wish to have any home visits this week. CSW informed daughter that the home visit will take 60-80 minutes.   Daughter shares that patient has applied for Medicaid in the past but was denied because her spouse's income was too high. She shares that family is not interested in applying for Medicaid. She does not know patient's exact income. Daughter shares that patient is NOT interested in LTC placement and at times gets frustrated with accepting help from others as she does  not like for other people to do for her. She shares that she is worried that patient will be accepting into programs because with patient's Parkinson's Disease, "She has both good and bad days. Some days she does really well." CSW provided additional education on program requirements and that patient should not have a problem with being approved.   CSW will complete home visit next week and will provide all community resource information and make referrals as needed. Daughter wishes for CSW to make referral to [facility] program. CSW informed her that she heard that they were no longer accepting new referrals but can attempt to make referral this week. Daughter expressed understanding.  [facility] CM Care Plan Problem One     Most Recent Value  Care Plan Problem One  Lack of personal care resources and support within the home as patient's spouse (primary caregiver) had foot surgery yesterday  Role Documenting the Problem One  Clinical Social Worker  Care Plan for Problem One  Active  [facility] Long Term Goal (31-90 days)  Patient will gain personal care resource information within 90 days per patient/family report  [facility] Long Term Goal Start Date  08/05/16  Interventions for Problem One Long Term Goal  CSW will complete home visit within 30 days and provide handouts and education on various community resources within [facility] that can assist patient with personal care needs. CSW will make referrals as needed. CSW will reeducate family and patient as needed.       Plan-CSW will send involvement letter to PCP. CSW will complete home visit next week.  [name], BSW, MSW, LCSW [place]  [email or web address] Phone: [phone] Fax: [phone]

## 2016-08-05 NOTE — Patient Outreach (Addendum)
[facility]) Care Management  08/05/2016  [name] 1945-12-03 [id number]   Telephone Screen  Referral Date: 08/05/16 Referral Source: MD office(Dr. [place]) Referral Reason: " med management, diabetes" Insurance: [employer]   Outreach attempt #1 to patient. Spoke with patient and she requested call be completed with her dtr-[name].   Social: Patient lives with her spouse. Spouse had foot surgery on yesterday. HE will not be able to assist much with patient's care for the next six weeks as he recovers from surgery. Patient requires assistance with all ADLS/IADLs. Dtr states she is helping as much as she can but they need additional support and services int the home to assist with patient's care needs. She plans to transport patient to appts while her step dad is recovering. Dtr reports patient has had multiple falls in the home and too many to count but voiced "at least 10 falls" within the past year. No major injuries sustained. DME in the home include walker and cbg meter. Patient was getting outpt PT at rehab center but has since stopped going and very vague and unclear regarding reason for stopping. Per PT note patient had several missed visits and a fall that needed follow up with MD prior to resuming thrapy. Patient looked into applying for Medicaid several years ago but was told that her spouse's income was too high.   Conditions: Patient has PMH of Parkinson's disease MD notes worsening and progressing), DM, CKD stage 3. Patient was checking cbgs in the home.However, dtr states that they no longer check cbgs and unable to provide rationale for stopping. Last A1C noted in chart is 5.8(Nov 2017). They are unsure if patient had ad any hypo/hyperglycemic events.    Medications: Dtr states that patient taking about 11 meds total. Patient had a friend who was helping assist with med planner as patient does not want dtr to  assist with meds. She states that patient does get confused about meds at times.   Appointments: Patient saw PCP on 07/23/16. She also sees a neurologist(Dr. Penumalli)-due to see again in May and Dr. Hedda Slade.   Advance Directives: None. Declined further info.   Consent: [facility] services reviewed and discussed. Patient and dtr gave verbal consent for services. Dtr has requested that she be contacted to coordinate [facility] HVs as patient and her spouse do not answer unknown phone calls as well as patient doesn't always hear and understand well. She can be reached on her cell at [phone]. ROI is on file to speak with both spouse(Donald) and dtr.   Plan: RN CM will notify [facility] administrative assistant of case status. RN CM will send referral to [facility] RN for further in home eval/assessment of care needs and management of chronic conditions. RN CM will send [place] SW referral for possible assistance with community and in home support/resources. RN CM will send [facility] pharmacy referral for medication management and polypharmacy med review. RN CM provided patient with [facility] 24 hr Nurse Line contact info.  Enzo Montgomery, RN,BSN,CCM [place] Management Telephonic Care Management Coordinator Direct Phone: [phone] Toll Free: [phone] Fax: [phone]

## 2016-08-05 NOTE — Therapy (Signed)
[facility] [place] [address] [place], [place], [address] Phone: [phone]   Fax:  [phone]  Patient Details  Name: [name] MRN: [id number] Date of Birth: [birth year] Referring Provider:  No ref. provider found  Encounter Date: 31-Aug-2016      G-Codes - 08/31/16 1008    Functional Assessment Tool Used (Outpatient Only) Pt did not return after 4th visit-same as eval   Functional Limitation Mobility: Walking and moving around   Mobility: Walking and Moving Around Goal Status [phone]) At least 20 percent but less than 40 percent impaired, limited or restricted   Mobility: Walking and Moving Around Discharge Status [phone]) At least 40 percent but less than 60 percent impaired, limited or restricted      [place]  Visits from Start of Care: 4 (last visit 06/13/16)  Current functional level related to goals / functional outcomes: See eval-pt missed several visits due to cancellations, then had fall and PT recommended pt follow up with physician following the most recent fall.   Remaining deficits: Falls, balance, see eval for full details.   Education / Equipment: Fall prevention, safety education with gait and transfers.  Plan: Patient agrees to discharge.  Patient goals were not met. Patient is being discharged due to not returning since the last visit.  ?????      Giomar Gusler W. 2016-08-31, 10:09 AM [facility] [address] [place], [place], [address] Phone: [phone]   Fax:  [phone]

## 2016-08-06 ENCOUNTER — Other Ambulatory Visit: Payer: Self-pay | Admitting: Licensed Clinical Social Worker

## 2016-08-06 NOTE — Patient Outreach (Signed)
[facility]) Care Management  08/06/2016  [name] Jul 12, 1945 [id number]   Assessment- CSW completed call to [facility] and spoke to staff. CSW was informed that [facility] is no longer taking referrals and they are not sure as to how long their program will be on halt but that family can check back in a few months to see.  Plan-CSW will complete home visit next week with family and will provide update.  [name], BSW, MSW, [email or web address] Phone: [phone] Fax: [phone]

## 2016-08-07 ENCOUNTER — Telehealth: Payer: Self-pay | Admitting: Pharmacist

## 2016-08-07 NOTE — Patient Outreach (Signed)
[facility]) Care Management  08/07/2016  [name] Aug 06, 1945 [id number]   Patient was called regarding medication management per referral from her physician, Dr. Laurance Flatten.  HIPAA identifiers were obtained.  The referral from the patient's provider requested [facility] home visits.   Pharmacy home visit was scheduled 08/12/16 at 1:00pm (just after the social work home visit).   Elayne Guerin, PharmD, [place] Clinical Pharmacist [phone]

## 2016-08-11 ENCOUNTER — Other Ambulatory Visit: Payer: Self-pay

## 2016-08-12 ENCOUNTER — Encounter: Payer: Self-pay | Admitting: Licensed Clinical Social Worker

## 2016-08-12 ENCOUNTER — Other Ambulatory Visit: Payer: Self-pay | Admitting: Pharmacist

## 2016-08-12 ENCOUNTER — Other Ambulatory Visit: Payer: Self-pay | Admitting: Licensed Clinical Social Worker

## 2016-08-12 ENCOUNTER — Encounter: Payer: Self-pay | Admitting: Pharmacist

## 2016-08-12 ENCOUNTER — Other Ambulatory Visit: Payer: Self-pay

## 2016-08-12 NOTE — Patient Outreach (Signed)
[facility]) Care Management  08/12/2016  [name] [birth year] [id number]   Request received from Jettie Booze, LCSW to mail patient personal care resource. Information mailed today.

## 2016-08-12 NOTE — Patient Outreach (Signed)
[facility] [facility]) Care Management  08/12/2016  [name] 01/18/1946 [id number]   Assessment- CSW completed call to [facility] [place] on 08/12/16 but was unable to reach staff.  Plan-CSW will make another attempt within one week to place referral to program. CSW will also make referral to [employer] within one week.  [name], [name], [name], [name] Phone: [phone] Fax: [phone]

## 2016-08-12 NOTE — Patient Outreach (Signed)
[name]) Care Management  [place]   08/12/2016  [name] 08-21-1945 [id number]  Subjective: Home visit completed in the patient's home with her husband and daughter.  HIPAA identifiers were obtained.  Patient was referred for medication management by Dr. Laurance Young.  She is a 71 year old female with multiple medical conditions including but not limited to:  Hyperlipidemia, depression, chronic pain, Parkinson's Disease, type 2 diabetes, and frequent falls.  Patient uses a pill box. Her friend [name] has been filling the pill box for her on a weekly basis.  Patient reported wanting to continue her current system without future modifications.  Objective:   Encounter Medications: Outpatient Encounter Prescriptions as of 08/12/2016  Medication Sig Note  . aspirin 81 MG tablet Take 1 tablet (81 mg total) by mouth daily.   Marland Kitchen atorvastatin (LIPITOR) 20 MG tablet Take 1 tablet (20 mg total) by mouth daily.   . carbidopa-levodopa (SINEMET IR) 25-100 MG tablet Take 1-2 tablets by mouth 3 (three) times daily. 08/12/2016: Patient only takes PRN due to it making her feel nauseated.  . gabapentin (NEURONTIN) 300 MG capsule Patient reported taking 2 capsules in the morning and 1 capsule in the evening.   . metFORMIN (GLUCOPHAGE) 850 MG tablet TAKE 1 TABLET(850 MG) BY MOUTH DAILY WITH BREAKFAST 08/12/2016: Patient had Metformin 500mg  tablets in her possession and was taking 1 tablet daily  . olmesartan (BENICAR) 5 MG tablet Take 2 tablets (10 mg total) by mouth daily.   Marland Kitchen omeprazole (PRILOSEC) 40 MG capsule TAKE 1 CAPSULE BY MOUTH EVERY DAY   . ONE TOUCH ULTRA TEST test strip USE AS DIRECTED   . ONETOUCH DELICA LANCETS 01B MISC Daily for Glucose Testing   . propranolol ER (INDERAL LA) 80 MG 24 hr capsule TAKE 1 CAPSULE BY MOUTH DAILY   . sertraline (ZOLOFT) 100 MG tablet Take 100 mg by mouth daily.    . traMADol (ULTRAM) 50 MG tablet TAKE 1-2 TABLETS BY MOUTH TWICE DAILY AS  NEEDED FOR PAIN   . fluticasone (FLONASE) 50 MCG/ACT nasal spray SHAKE LIQUID AND USE 2 SPRAYS IN EACH NOSTRIL DAILY (Patient not taking: Reported on 08/12/2016)   . [DISCONTINUED] omeprazole (PRILOSEC) 40 MG capsule TAKE 1 CAPSULE BY MOUTH EVERY DAY    Facility-Administered Encounter Medications as of 08/12/2016  Medication  . cyanocobalamin ((VITAMIN B-12)) injection 1,000 mcg    Functional Status: In your present state of health, do you have any difficulty performing the following activities: 08/12/2016  Hearing? N  Vision? Y  Difficulty concentrating or making decisions? Y  Walking or climbing stairs? Y  Dressing or bathing? Y  Doing errands, shopping? Y  Some recent data might be hidden    Fall/Depression Screening: Fall Risk  08/12/2016 08/05/2016 07/23/2016  Falls in the past year? Yes Yes Yes  Number falls in past yr: 2 or more 2 or more 2 or more  Injury with Fall? Yes No -  Risk Factor Category  High Fall Risk High Fall Risk -  Risk for fall due to : History of fall(s);Impaired balance/gait;Impaired mobility History of fall(s);Impaired balance/gait;Impaired mobility;Medication side effect -  Follow up Falls evaluation completed;Education provided;Falls prevention discussed Falls evaluation completed;Education provided;Falls prevention discussed -   PHQ 2/9 Scores 08/12/2016 08/05/2016 07/23/2016 07/10/2016 05/14/2016 02/27/2016 08/08/2015  PHQ - 2 Score 0 - 0 0 0 0 0  PHQ- 9 Score - - - - - - -  Exception Documentation - Other- indicate reason in  comment box - - - - -  Not completed - call completed with dtr - - - - -    Assessment: Patient's medications were reviewed and reconciled against the medication list in her chart during the home visit.   Drugs sorted by system:   Neurologic/Psychologic: Sertraline Carbidopa/Levodopa Propranolol LA 80mg   Cardiovascular: Olmesartan  Atorvastatin Aspirin  Pulmonary/Allergy: Fluticasone (patient said she does not use  this)  Gastrointestinal: Omeprazole  Endocrine: Metformin  Pain: Tramadol    Medication Review Findings:  1. Dose Discrepancy- Metformin- patient had Metformin 500 mg in her possession instead of 850mg .  In addition, the patient said her dose was decreased to Metformin 500mg  1 tablet daily (med list stated Metformin 850mg  1 tablet twice daily).   2.  Omitted medication/Adherence Gabapentin was not on the patient's medication list. It was prescribed by her psychiatrist. The instructions on the medication bottle stated:  Gabapentin 300mg  1 capsule in the morning and 2 capsules at bedtime.  The patient had Gabapentin 2 capsules in AM slot of the pill box and 2 capsules in the Bedtime slot of the pill box (this was corrected).  3.  Adherence- Aspirin-was not in the patient's pill box and patient could not remember taking it.  (Aspirin was added to the patient's pill box).  Medication Assistance  Patient has [employer] She said she is not having a issue a paying for her medications.   She uses [employer] was called on the patient's behalf.  Although she is able to afford her medications, she does have an OTC benefit as part of her plan.  Meaning, the patient can order $30 worth of OTC products every 3 months.    Interventions:  1.  Medication review and pill box check for accuracy  2.  Personalized medication list by time with indications listed (from [email or web address].com)  3.  [employer] OTC catalog printed and given to patient with ordering instructions.  4.  Provided patient with a new pill box.  Plan: 1.  Route note to patient's PCP  2.  Follow up with patient in 1 week.  3.  Close pharmacy case if no further issues.  [name], PharmD, [place] Clinical Pharmacist [phone]

## 2016-08-12 NOTE — Patient Outreach (Signed)
   Unsuccessful attempt made to contact patient via telephone for the initial assessment. Will make another attempt to make telephone contact on tomorrow.

## 2016-08-12 NOTE — Patient Outreach (Signed)
[facility]) Care Management  [facility] Social Work  08/12/2016  LAVEDA DEMEDEIROS 05/27/1945 [id number]   Encounter Medications:  Outpatient Encounter Prescriptions as of 08/12/2016  Medication Sig  . aspirin 81 MG tablet Take 1 tablet (81 mg total) by mouth daily.  Marland Kitchen atorvastatin (LIPITOR) 20 MG tablet Take 1 tablet (20 mg total) by mouth daily.  . carbidopa-levodopa (SINEMET IR) 25-100 MG tablet Take 1-2 tablets by mouth 3 (three) times daily.  . fluticasone (FLONASE) 50 MCG/ACT nasal spray SHAKE LIQUID AND USE 2 SPRAYS IN EACH NOSTRIL DAILY  . metFORMIN (GLUCOPHAGE) 850 MG tablet TAKE 1 TABLET(850 MG) BY MOUTH DAILY WITH BREAKFAST  . olmesartan (BENICAR) 5 MG tablet Take 2 tablets (10 mg total) by mouth daily.  Marland Kitchen omeprazole (PRILOSEC) 40 MG capsule TAKE 1 CAPSULE BY MOUTH EVERY DAY  . omeprazole (PRILOSEC) 40 MG capsule TAKE 1 CAPSULE BY MOUTH EVERY DAY  . ONE TOUCH ULTRA TEST test strip USE AS DIRECTED  . ONETOUCH DELICA LANCETS 41D MISC Daily for Glucose Testing  . propranolol ER (INDERAL LA) 80 MG 24 hr capsule TAKE 1 CAPSULE BY MOUTH DAILY  . sertraline (ZOLOFT) 100 MG tablet Take 100 mg by mouth daily.   . traMADol (ULTRAM) 50 MG tablet TAKE 1-2 TABLETS BY MOUTH TWICE DAILY AS NEEDED FOR PAIN   Facility-Administered Encounter Medications as of 08/12/2016  Medication  . cyanocobalamin ((VITAMIN B-12)) injection 1,000 mcg    Functional Status:  In your present state of health, do you have any difficulty performing the following activities: 08/12/2016  Hearing? N  Vision? Y  Difficulty concentrating or making decisions? Y  Walking or climbing stairs? Y  Dressing or bathing? Y  Doing errands, shopping? Y  Some recent data might be hidden    Fall/Depression Screening:  PHQ 2/9 Scores 08/12/2016 08/05/2016 07/23/2016 07/10/2016 05/14/2016 02/27/2016 08/08/2015  PHQ - 2 Score 0 - 0 0 0 0 0  PHQ- 9 Score - - - - - - -  Exception Documentation - Other- indicate reason in  comment box - - - - -  Not completed - call completed with dtr - - - - -    Assessment: CSW completed initial home visit on 08/12/16 with patient, patient's daughter, patient's friend [name] and spouse. Patient's friend [name] is a Glass blower/designer and has experience with community resources. [name] assist family with needs by providing transportation to medical appointments and checking on family "occasionally."   Family is in need of personal care resources at this time. CSW provided a folder with handouts that include information on : [place], In [place], [employer], [place], Development worker, community and [employer]. Patietn is not interested in a day program but is very interested in going to the [facility] since it is less than a mile away. Patient's friend [name] has agreed to transport patient to center next week. CSW will mail family [facility] calendar for both May and June. Family was made aware that the [facility] program is no longer taking referrals at this time and patient's daughter has agreed to contact [facility] monthly to see if they are taking new referrals. Family is interested in In [employer] and CSW will place patient on the wait list for this program. Family is appreciative of Private Pay Caregiver information but feel that they will be able to network with the community to find a caregiver that is more affordable to them. CSW  reviewed Restaurant manager, fast food. Family denies needing transportation assistance at this time but they are agreeable to [place] making a referral to [employer] for both patient and spouse.   Patient is ambulating very well today but family admits that "everyday is different." Patient has a walker with brakes that at times she finds difficulty to use. Family estimates that patient has up to 6 falls within the past year. Family is interested in patient gaining [facility] services. Daughter is agreeable to contacting PCP  and requesting that order be placed for PT and OT.  Patient denies wishing to complete an advance directive. Patient denies experiencing any depressive symptoms at this time but shares that she sees Dr. Casimiro Needle every 3 months for psychiatric services. Family agree that socialization at the [facility] would be beneficial for patient.   [facility] CM Care Plan Problem One     Most Recent Value  Care Plan Problem One  Lack of personal care resources and support within the home as patient's spouse (primary caregiver) had foot surgery yesterday  Role Documenting the Problem One  Clinical Social Worker  Care Plan for Problem One  Active  [facility] Long Term Goal (31-90 days)  Patient will gain personal care resource information within 90 days per patient/family report  [facility] Long Term Goal Start Date  08/05/16  Interventions for Problem One Long Term Goal  CSW will complete home visit within 30 days and provide handouts and education on various community resources within [facility] that can assist patient with personal care needs. CSW will make referrals as needed. CSW will reeducate family and patient as needed.   [facility] CM Short Term Goal #1 (0-30 days)  Patient will be put on the wait list for In [place] within 30 days  [facility] CM Short Term Goal #1 Start Date  08/12/16  Interventions for Short Term Goal #1  CSW completed home visit and provided handout on program and will complete referral to program within 30 days.     Plan: CSW will make referral to [employer] and In [place]. CSW will route encounter to PCP. CSW will mail out [facility] Calendar to family as well. CSW will follow up within two-three weeks.   [name], BSW, MSW, [email or web address] Phone: [phone] Fax: [phone]

## 2016-08-13 ENCOUNTER — Other Ambulatory Visit: Payer: Self-pay | Admitting: Licensed Clinical Social Worker

## 2016-08-13 NOTE — Patient Outreach (Signed)
[facility]) Care Management  08/13/2016  DEZIYAH ARVIN 01-06-1946 [id number]  Assessment- CSW completed call to [employer] and completed referral to [employer] for patient and spouse. CSW has no information on spouse other than his name but wanted to let [employer] social worker know that both patient and spouse are interested in program. CSW informed Senior Resource Line to schedule initial home visit through daughter.   CSW completed call to In [facility] and was unable to reach anyone in order to place referral to program. CSW will continue to make attempts.  Plan-CSW will complete additional attempt to make referral to In [place] within one week.   [name], BSW, MSW, [email or web address] Phone: [phone] Fax: [phone]

## 2016-08-13 NOTE — Patient Outreach (Signed)
     Telephone contact successful with 2nd attempt. This RNCM introduced self, stated purpose of call. Patient agrees to home visit later this month for chronic disease education, community referrals.

## 2016-08-14 ENCOUNTER — Other Ambulatory Visit: Payer: Self-pay | Admitting: Licensed Clinical Social Worker

## 2016-08-14 NOTE — Patient Outreach (Signed)
[name]) Care Management  08/14/2016  [name] 18-Feb-1946 [id number]   Assessment- CSW completed call to DSS and spoke to staff with the [facility] [place] was able to successfully complete referral to program for patient. Current wait list is up to two years.  Plan-Goal met. CSW will follow up within three weeks.  [name], BSW, MSW, [name] Phone: [phone] Fax: [phone]

## 2016-08-19 ENCOUNTER — Other Ambulatory Visit: Payer: Self-pay | Admitting: Pharmacist

## 2016-08-19 ENCOUNTER — Encounter: Payer: Self-pay | Admitting: Pharmacist

## 2016-08-19 NOTE — Patient Outreach (Signed)
[facility]) Care Management  08/19/2016  [name] 05/30/1945 [id number]   Patient was called to follow up from pharmacy home visit. HIPAA identifiers were obtained.  Patient is a 71 year old female with multiple medical conditions including but not limited to:  Hyperlipidemia, depression, chronic pain, Parkinson's Disease, type 2 diabetes, and frequent falls. She was referred for medication management by Dr. Laurance Flatten.    Patient reported continuing to use a pill box.  She was given two new pill boxes at the pharmacy home visit. Patient says her friend "[name]" fills her box for her weekly-biweekly and she is able to manage her medications effectively.  She declined further [place] at this time and was not interested in blister packaging.    Plan:  1. Close pharmacy case.  Patient will continue to be followed by [place] Nurse Erenest Rasher and [facility] Social Worker, [name].  Patient has my contact information and understands she can contact me with any future pharmacy/medication questions or concerns.  Elayne Guerin, PharmD, [place] Clinical Pharmacist [phone] .

## 2016-08-20 ENCOUNTER — Other Ambulatory Visit: Payer: Self-pay

## 2016-08-20 ENCOUNTER — Other Ambulatory Visit: Payer: Self-pay | Admitting: Licensed Clinical Social Worker

## 2016-08-20 NOTE — Patient Outreach (Signed)
[facility]) Care Management  08/20/2016  [name] 05/29/1945 [id number]  Assessment- CSW received secure email from [place] who is completing home visit today with patient. She shares that patient wanted to inform CSW that she did go to the [facility] and enjoyed it. [facility] RNCM also wanted to see if family could qualify for Medicaid. Their income combined is $1,800 and at this time family would not be able to gain Medicaid because their income is over the $1,335 required amount. CSW updated [facility] RNCM.  Plan-CSW will follow up within patient within two weeks.  [name], BSW, MSW, [email or web address] Phone: [phone] Fax: [phone]

## 2016-08-21 DIAGNOSIS — F3341 Major depressive disorder, recurrent, in partial remission: Secondary | ICD-10-CM | POA: Diagnosis not present

## 2016-08-21 NOTE — Patient Outreach (Signed)
[name]) Care Management   08/21/2016  [name] [birth year] [id number]  [name] is an 71 y.o. female  Subjective:  My blood sugars have been good but I have not been checking them. I have fallen several times, I had to go to the hospital 3 times.  Objective:   ROS Well nourished, elderly film with upper extremity tremors.  Physical Exam  ROS  Encounter Medications:   Outpatient Encounter Prescriptions as of 08/20/2016  Medication Sig Note  . aspirin 81 MG tablet Take 1 tablet (81 mg total) by mouth daily.   Marland Kitchen atorvastatin (LIPITOR) 20 MG tablet Take 1 tablet (20 mg total) by mouth daily.   . carbidopa-levodopa (SINEMET IR) 25-100 MG tablet Take 1-2 tablets by mouth 3 (three) times daily. 08/12/2016: Patient only takes PRN due to it making her feel nauseated.  . fluticasone (FLONASE) 50 MCG/ACT nasal spray SHAKE LIQUID AND USE 2 SPRAYS IN EACH NOSTRIL DAILY   . gabapentin (NEURONTIN) 300 MG capsule Patient reported taking 2 capsules in the morning and 1 capsule in the evening.   . metFORMIN (GLUCOPHAGE) 850 MG tablet TAKE 1 TABLET(850 MG) BY MOUTH DAILY WITH BREAKFAST 08/12/2016: Patient had Metformin 576m tablets in her possession and was taking 1 tablet daily  . olmesartan (BENICAR) 5 MG tablet Take 2 tablets (10 mg total) by mouth daily.   .Marland Kitchenomeprazole (PRILOSEC) 40 MG capsule TAKE 1 CAPSULE BY MOUTH EVERY DAY   . ONE TOUCH ULTRA TEST test strip USE AS DIRECTED   . ONETOUCH DELICA LANCETS 391MMISC Daily for Glucose Testing   . propranolol ER (INDERAL LA) 80 MG 24 hr capsule TAKE 1 CAPSULE BY MOUTH DAILY   . sertraline (ZOLOFT) 100 MG tablet Take 100 mg by mouth daily.    . traMADol (ULTRAM) 50 MG tablet TAKE 1-2 TABLETS BY MOUTH TWICE DAILY AS NEEDED FOR PAIN    Facility-Administered Encounter Medications as of 08/20/2016  Medication  . cyanocobalamin ((VITAMIN B-12)) injection 1,000 mcg    Functional Status:   In your present state of  health, do you have any difficulty performing the following activities: 08/20/2016 08/12/2016  Hearing? N N  Vision? Y Y  Difficulty concentrating or making decisions? YTempie Donning Walking or climbing stairs? Y Y  Dressing or bathing? Y Y  Doing errands, shopping? YTempie Donning Preparing Food and eating ? Y -  Using the Toilet? N -  In the past six months, have you accidently leaked urine? Y -  Do you have problems with loss of bowel control? Y -  Managing your Medications? N -  Managing your Finances? Y -  Housekeeping or managing your Housekeeping? Y -  Some recent data might be hidden    Fall/Depression Screening:    Fall Risk  08/20/2016 08/12/2016 08/05/2016  Falls in the past year? Yes Yes Yes  Number falls in past yr: 2 or more 2 or more 2 or more  Injury with Fall? Yes Yes No  Risk Factor Category  High Fall Risk High Fall Risk High Fall Risk  Risk for fall due to : History of fall(s);Impaired balance/gait;Impaired mobility;Impaired vision;Medication side effect History of fall(s);Impaired balance/gait;Impaired mobility History of fall(s);Impaired balance/gait;Impaired mobility;Medication side effect  Follow up Education provided;Falls prevention discussed;Follow up appointment Falls evaluation completed;Education provided;Falls prevention discussed Falls evaluation completed;Education provided;Falls prevention discussed   PHQ 2/9 Scores 08/20/2016 08/12/2016 08/05/2016 07/23/2016 07/10/2016 05/14/2016 02/27/2016  PHQ - 2 Score 0 0 -  0 0 0 0  PHQ- 9 Score - - - - - - -  Exception Documentation - - Other- indicate reason in comment box - - - -  Not completed - - call completed with dtr - - - -   [facility] CM Care Plan Problem One     Most Recent Value  Care Plan Problem One  patient has had frequent falls  Role Documenting the Problem One  Care Management [place] for Problem One  Active  [facility] Long Term Goal (31-90 days)  In the next 31 days, patient will have nomore than 1 fall   [facility] Long Term  Goal Start Date  08/20/16  Interventions for Problem One Long Term Goal  Assessment of case managment needs completed  [facility] CM Short Term Goal #1 (0-30 days)  patient will meet with [facility] RNCM  for fall prevention education  [facility] CM Short Term Goal #1 Start Date  08/20/16  Interventions for Short Term Goal #1  RNCM met wtih patient who reports 10 fall in the last 12 years with injury after 3 of the falls    [facility] CM Care Plan Problem Two     Most Recent Value  Care Plan Problem Two  Patient is suppose to take her blood glucose once a week, has not been monitoring and recording  Role Documenting the Problem Two  Care Management Coordinator  Care Plan for Problem Two  Active  [facility] CM Short Term Goal #1 (0-30 days)  Patient will monitor and record glucose readings 3 out of 4  weeks   [facility] CM Short Term Goal #1 Start Date  08/20/16  Interventions for Short Term Goal #2   Initial home visit to assess community care coordination needs     Fall Risk  08/20/2016 08/12/2016 08/05/2016 07/23/2016 07/10/2016  Falls in the past year? Yes Yes Yes Yes Yes  Number falls in past yr: 2 or more 2 or more 2 or more 2 or more 2 or more  Injury with Fall? Yes Yes No - -  Risk Factor Category  High Fall Risk High Fall Risk High Fall Risk - -  Risk for fall due to : History of fall(s);Impaired balance/gait;Impaired mobility;Impaired vision;Medication side effect History of fall(s);Impaired balance/gait;Impaired mobility History of fall(s);Impaired balance/gait;Impaired mobility;Medication side effect - History of fall(s)  Follow up Education provided;Falls prevention discussed;Follow up appointment Falls evaluation completed;Education provided;Falls prevention discussed Falls evaluation completed;Education provided;Falls prevention discussed - -   Assessment:   Patient has history of falls, however she does not utilize assistive devices to assist with balance. Patient has history of diabetes but doe snot monitor her glucose  levels. Patient lives in a single family, rent to own with her husband who has a physical disability. Patient depends on husband and children to assist with IADLs, patient states she has been doing the best she could with her ADLs. Patient has tremors, with history of Parkinsons Disease. Patient has met with [facility]'s [name], referrals made to [place] in home aide program. Patient and husband does not qualify for Medicaid due to income level ($1800/month) Patient viewed Fall Prevention/Home Safety, Advanced Directive on [facility].   Plan:   Home visit later this month for further assessment of community care coordination needs.

## 2016-08-27 ENCOUNTER — Other Ambulatory Visit: Payer: Self-pay | Admitting: Licensed Clinical Social Worker

## 2016-08-27 ENCOUNTER — Other Ambulatory Visit: Payer: Self-pay

## 2016-08-27 ENCOUNTER — Encounter: Payer: Self-pay | Admitting: Licensed Clinical Social Worker

## 2016-08-27 NOTE — Patient Outreach (Signed)
[facility]) Care Management  08/27/2016  [name] 12-20-1945 [id number]   Assessment- CSW completed call to patient's residence but was unable to reach patient. CSW then completed call to patient's daughter [name] and she answered. She reports that patient seems to be doing very well over the past few weeks. She shares that she is still has all personal care resource information packet that CSW provided during last home visit. CSW informed daughter that CSW was successful in placing patient on the wait list for [facility] [place]. CSW also informed daughter that they will be contacting her by phone or by mail every few months to ensure that patient is still interested in program. CSW informed daughter that CSW was informed by [facility] that their program will soon be closing down so daughter will not need to contact program every few months in order to see if she could put patient on the wait list for CNA program. Daughter appreciative of this information. She shares that patient was able to go to the [facility] and gain some socialization. She shares that their family friend [name] was able to take patient and plans on doing so again very soon. Daughter reports that patient really enjoys going to to [facility]. CSW informed daughter that [employer] referral was placed for both patient and spouse. She reports "I have been playing phone tag with the social worker there. She calls me but I don't answer and I call her and she doesn't answer." She reports that she missed the [employer] social worker's call again today but plans on making another attempt to reach her this afternoon. Daughter appreciative of social work assistance, resource review and referrals that were placed successfully. Daughter denies any further social work needs at this time and is agreeable to social work discharge. CSW informed family to keep CSW's number in case they have any concerns in the  future.  [facility] CM Care Plan Problem One     Most Recent Value  Care Plan Problem One  Lack of personal care resources and support within the home as patient's spouse (primary caregiver) had foot surgery yesterday  Role Documenting the Problem One  Clinical Social Worker  Care Plan for Problem One  Active  [facility] Long Term Goal (31-90 days)  Patient will gain personal care resource information within 90 days per patient/family report  [facility] Long Term Goal Start Date  08/05/16  [facility] Long Term Goal Met Date  08/27/16  Interventions for Problem One Long Term Goal  Goal met  [facility] CM Short Term Goal #1 (0-30 days)  Patient will be put on the wait list for [facility] [place] within 30 days  [facility] CM Short Term Goal #1 Start Date  08/12/16  Interventions for Short Term Goal #1  Goal met. Patient is now on wait list.     Plan-CSW will update [facility] RNCM and PCP of social work discharge.  [name], BSW, MSW, [email or web address] Phone: [phone] Fax: [phone]

## 2016-08-28 NOTE — Patient Outreach (Signed)
     Unsuccessful attempt made to contact patient via telephone.  Plan: Make another attempt to contact patient by telephone later this month

## 2016-09-03 ENCOUNTER — Other Ambulatory Visit: Payer: Self-pay | Admitting: Family Medicine

## 2016-09-03 ENCOUNTER — Other Ambulatory Visit: Payer: Self-pay

## 2016-09-03 NOTE — Patient Outreach (Signed)
[place] [facility]) Care Management   09/03/2016  [name] 02/01/1946 [id number]  [name] is an 71 y.o. female  Subjective:  I have been checking my blood sugar once a week. My blood glucose levels have been fine.   I have not had any falls since I last saw you. I have removed all the rugs off the floor  Objective:   ROS Elderly lady with parkinsonian movement of hands, head.  Physical Exam ROS  Encounter Medications:   Outpatient Encounter Prescriptions as of 09/03/2016  Medication Sig Note  . aspirin 81 MG tablet Take 1 tablet (81 mg total) by mouth daily.   Marland Kitchen atorvastatin (LIPITOR) 20 MG tablet Take 1 tablet (20 mg total) by mouth daily.   . carbidopa-levodopa (SINEMET IR) 25-100 MG tablet Take 1-2 tablets by mouth 3 (three) times daily. 08/12/2016: Patient only takes PRN due to it making her feel nauseated.  . fluticasone (FLONASE) 50 MCG/ACT nasal spray SHAKE LIQUID AND USE 2 SPRAYS IN EACH NOSTRIL DAILY   . gabapentin (NEURONTIN) 300 MG capsule Patient reported taking 2 capsules in the morning and 1 capsule in the evening.   . metFORMIN (GLUCOPHAGE) 850 MG tablet TAKE 1 TABLET(850 MG) BY MOUTH DAILY WITH BREAKFAST 08/12/2016: Patient had Metformin 583m tablets in her possession and was taking 1 tablet daily  . olmesartan (BENICAR) 5 MG tablet Take 2 tablets (10 mg total) by mouth daily.   .Marland Kitchenomeprazole (PRILOSEC) 40 MG capsule TAKE 1 CAPSULE BY MOUTH EVERY DAY   . ONE TOUCH ULTRA TEST test strip USE AS DIRECTED   . ONETOUCH DELICA LANCETS 302VMISC Daily for Glucose Testing   . propranolol ER (INDERAL LA) 80 MG 24 hr capsule TAKE 1 CAPSULE BY MOUTH DAILY   . sertraline (ZOLOFT) 100 MG tablet Take 100 mg by mouth daily.    . traMADol (ULTRAM) 50 MG tablet TAKE 1-2 TABLETS BY MOUTH TWICE DAILY AS NEEDED FOR PAIN    Facility-Administered Encounter Medications as of 09/03/2016  Medication  . cyanocobalamin ((VITAMIN B-12)) injection 1,000 mcg     Functional Status:   In your present state of health, do you have any difficulty performing the following activities: 08/20/2016 08/12/2016  Hearing? N N  Vision? Y Y  Difficulty concentrating or making decisions? YTempie Donning Walking or climbing stairs? Y Y  Dressing or bathing? Y Y  Doing errands, shopping? YTempie Donning Preparing Food and eating ? Y -  Using the Toilet? N -  In the past six months, have you accidently leaked urine? Y -  Do you have problems with loss of bowel control? Y -  Managing your Medications? N -  Managing your Finances? Y -  Housekeeping or managing your Housekeeping? Y -  Some recent data might be hidden    Fall/Depression Screening:    Fall Risk  08/20/2016 08/12/2016 08/05/2016  Falls in the past year? Yes Yes Yes  Number falls in past yr: 2 or more 2 or more 2 or more  Injury with Fall? Yes Yes No  Risk Factor Category  High Fall Risk High Fall Risk High Fall Risk  Risk for fall due to : History of fall(s);Impaired balance/gait;Impaired mobility;Impaired vision;Medication side effect History of fall(s);Impaired balance/gait;Impaired mobility History of fall(s);Impaired balance/gait;Impaired mobility;Medication side effect  Follow up Education provided;Falls prevention discussed;Follow up appointment Falls evaluation completed;Education provided;Falls prevention discussed Falls evaluation completed;Education provided;Falls prevention discussed   PHQ 2/9 Scores 08/20/2016 08/12/2016 08/05/2016 07/23/2016  07/10/2016 05/14/2016 02/27/2016  PHQ - 2 Score 0 0 - 0 0 0 0  PHQ- 9 Score - - - - - - -  Exception Documentation - - Other- indicate reason in comment box - - - -  Not completed - - call completed with dtr - - - -   [facility] CM Care Plan Problem One     Most Recent Value  Care Plan Problem One  patient has had frequent falls  Role Documenting the Problem One  Care Management [place] for Problem One  Active  [facility] Long Term Goal (31-90 days)  In the next 31 days,  patient will have nomore than 1 fall   [facility] Long Term Goal Start Date  08/20/16  [facility] Long Term Goal Met Date  08/27/16  Interventions for Problem One Long Term Goal  routine home visit for assessment of patient'sproession towards meeting her case managment goals.  [facility] CM Short Term Goal #1 (0-30 days)  patient will meet with [facility] RNCM  for fall prevention education  [facility] CM Short Term Goal #1 Start Date  08/20/16  [facility] CM Short Term Goal #1 Met Date  09/03/16  Interventions for Short Term Goal #1  Reviewed patient's goals,progressio towards     [facility] CM Care Plan Problem Two     Most Recent Value  Care Plan Problem Two  Patient is suppose to take her blood glucose once a week, has not been monitoring and recording  Role Documenting the Problem Two  Care Management [place] for Problem Two  Active  [facility] CM Short Term Goal #1 (0-30 days)  Patient will monitor and record glucose readings 3 out of 4  weeks   [facility] CM Short Term Goal #1 Start Date  08/20/16  Interventions for Short Term Goal #2   routine home visit to assess patient's progress.  patient has measured her blood glucose 2 times since our last home visit.     Assessment:   Patient denies falling since last home visit. Patient has recorded her weekly glucose levels. Patient is progressing towards reaching her case management goals RNCM advised patient she does not qualify for medicaid due to not meeting income requirements.    Plan:  Patient and RNCM will make telephone contact in the next 21 days to assess for further case management goals.

## 2016-09-05 NOTE — Telephone Encounter (Signed)
This encounter was created in error - please disregard.

## 2016-09-09 ENCOUNTER — Encounter (INDEPENDENT_AMBULATORY_CARE_PROVIDER_SITE_OTHER): Payer: Self-pay

## 2016-09-09 ENCOUNTER — Ambulatory Visit (INDEPENDENT_AMBULATORY_CARE_PROVIDER_SITE_OTHER): Payer: Medicare HMO | Admitting: Diagnostic Neuroimaging

## 2016-09-09 ENCOUNTER — Encounter: Payer: Self-pay | Admitting: Diagnostic Neuroimaging

## 2016-09-09 VITALS — BP 120/78 | HR 76 | Wt 174.2 lb

## 2016-09-09 DIAGNOSIS — G20 Parkinson's disease: Secondary | ICD-10-CM | POA: Diagnosis not present

## 2016-09-09 DIAGNOSIS — G249 Dystonia, unspecified: Secondary | ICD-10-CM

## 2016-09-09 DIAGNOSIS — R269 Unspecified abnormalities of gait and mobility: Secondary | ICD-10-CM

## 2016-09-09 NOTE — Progress Notes (Signed)
[facility]  PATIENT: [name] DOB: 07/26/1945  REFERRING CLINICIAN:  HISTORY FROM: patient and friend REASON FOR VISIT: follow up   HISTORICAL  CHIEF COMPLAINT:  Chief Complaint  Patient presents with  . Parkinson's disease    rm 52, friend- [name], "no new concerns"  . Follow-up    4 month    HISTORY OF PRESENT ILLNESS:   UPDATE 09/09/16 (VRP): Since last visit, no more falls. Now off any benzos or sleep aids. Using walker at times, but mainly walking stick. [facility] care mgmt has reached out to help patient. Now on carb/levo 1 / 1 / 0.5. Continues with nausea.   UPDATE 05/05/16 (MM): "She returns today after experiencing increased falls in the last 2 months. She states that some of the falls have been severe and she has fallen on her face. Fortunately she has not suffered any significant injuries. She does state that her left shoulder and neck is slightly tight after one of the falls. She is not using an assistive device. She states that she did purchase a walker last week. Her husband states that she tends to drag her feet when ambulating. Reports that she had physical therapy over a year ago. She is currently taking Sinemet 25-100 milligrams one and a half tablets 3 times a day. She takes her first dose at 8:30, second dose at 3 PM and last dose at 9:30 PM. She states that she can tell a difference if she misses a dose. Denies any changes in her sleep. She states that if she does not chew her food up well she will become choked. Denies getting choked on liquids. Reports that she does have a tremor in the hands and mouth. She does note that she shuffles her feet when ambulating. She returns today for an evaluation."  UPDATE 03/12/16: Since last visit, continues to have falls; ~ 5 x in last 6 months. Not using cane or walker. Had episode of believing that she went to a party in [place], [place] (which she did not) and then told her family about it. No further  delusions or hallucinations. Has been on lower carb/levo (1 tab twice a day) lately due to nausea.  UPDATE 09/03/15: Since last visit, stable. Did well with PT. Doing well with lower dose of carb/levo.   UPDATE 02/13/15: Since last visit, continues to have tremor, tongue and mouth movments (better), sweating, anxiety, inverted sleep schedule.   UPDATE 10/09/14: Since last visit, had increased carb/levo up to 2.5 tabs TID (even though I had recommended going up to 2 tabs TID; she's not sure why she did this). More tongue and mouth movements. More falls. Went to hospital for UTI, AKI, N/V/diarrhea In March 2016.   UPDATE 04/04/14: Since last visit, she reports that she is stable from PD standpoint. Tremor and gait are stable. Tolerating carb/levo 1.5 tabs TID. Hasn't tried going up to 2 tabs TID. Re: swallowing issues, she reports remote esophagus surgery for swallow issues 15 yrs ago. Feels that this is a problem again, and wants to see GI. Re: rash, it turns out that they had bedbugs in their home; now problem is resolved. Struggling with diarrhea and excess sweating.  UPDATE 12/26/13: Since last visit, was hospitalized (2 weeks ago) for delirium, hallucinations, chest pain, UTI, and medication overuse. Was taking ambien and a friend's xanax to help her sleep (was struggling with anxiety and insomnia; not seen Dr. Casimiro Needle in a while). Now back at home  and doing better. Tremor and gait are worsening. Some wearing off (early AM and late evening). Also with 2 weeks are night time itching and rash. Going to see dermatology soon.   UPDATE 01/18/13: Since last visit, now on carb/levo 1.5 tabs TID. Nausea is improved since she started taking her metformin at different time than carb/levo. Tremor, balance, coordination are stable.  UPDATE 06/01/12: Since last visit, tried carb/levo 1.5 tabs TID x 1 week, then stopped. Felt like it was too much medicine. Did not have increased side effects. Has been struggling with  nausea throughout. Tremor is persistent.  UPDATE 01/28/12: Doing well. No wearing off. Tolerating meds. Stopped azilect (per PCP for nausea). Now on fluoxetine for depression.  UPDATE 08/27/10: Doing better on carb/levo.  Taking 1 tab TID (6am, 4pm, 9pm).  Minimal nausea.  Wakes up with more tremor in AM, then reduction in tremor 43min after dose.  Effect wears off around 4pm.  Daughter reports some intermittent confusion.  PRIOR HPI (06/26/10): 71 yo Caucasian female referred to [place] for tremor with concern for possible Parkinson's disease. [name] notes she first noticed a resting tremor in her left hand about 3 years ago, and this has gotten progressively worse since then. It has also spread to now involve her mouth and right hand as well, though she notes it is still worst in her left hand. Stress and anxiety can accentuate the tremors, whereas active use can reduce them. She also notes the tremors being worse in the morning. She denies noting anything else that seems to make the tremors better or worse. She admits to what seems like possible bradykinesia, in her words that she has "a hard time getting going sometimes," but denies any freezing. She does note some new difficulty with writing, but denies micrographia. She also admits to feeling like her balance and walking is "a bit off," but she relates this more to the osteoarthritis in her knees and having had surgery on her R knee. She denies orthostatic symptoms, hallucinations or delusions, difficulty standing or sitting, weakness, new visual changes (aside from her macular degeneration), or feelings of rigidity. She also denies bizarre dreams, nightmares, RLS symptoms, or REM behavior disorder symptoms. She admits to having two uncles who have tremors, etiology unclear, but adds that one uncle had alcoholism (and it was believed this was the cause). She is concerned about what is causing her tremors, and admits that although she initially "put off"  being evaluated further, she is anxious to know what might be the cause of her tremors.   REVIEW OF SYSTEMS: Full 14 system review of systems performed and negative except for: only as per HPI.    ALLERGIES: Allergies  Allergen Reactions  . Ace Inhibitors Anaphylaxis and Swelling  . Azilect [Rasagiline Mesylate] Other (See Comments)    hypotension  . Lisinopril Swelling    Severe facial angioedema requiring hospitalization 2007 (approx)    HOME MEDICATIONS: Outpatient Medications Prior to Visit  Medication Sig Dispense Refill  . aspirin 81 MG tablet Take 1 tablet (81 mg total) by mouth daily. 30 tablet   . atorvastatin (LIPITOR) 20 MG tablet Take 1 tablet (20 mg total) by mouth daily. 90 tablet 3  . carbidopa-levodopa (SINEMET IR) 25-100 MG tablet Take 1-2 tablets by mouth 3 (three) times daily. 450 tablet 4  . fluticasone (FLONASE) 50 MCG/ACT nasal spray SHAKE LIQUID AND USE 2 SPRAYS IN EACH NOSTRIL DAILY 16 g 12  . gabapentin (NEURONTIN) 300 MG capsule  Patient reported taking 2 capsules in the morning and 1 capsule in the evening.    . metFORMIN (GLUCOPHAGE) 850 MG tablet TAKE 1 TABLET(850 MG) BY MOUTH DAILY WITH BREAKFAST 90 tablet 0  . olmesartan (BENICAR) 5 MG tablet Take 2 tablets (10 mg total) by mouth daily. 180 tablet 3  . omeprazole (PRILOSEC) 40 MG capsule TAKE 1 CAPSULE BY MOUTH EVERY DAY 90 capsule 3  . ONE TOUCH ULTRA TEST test strip USE AS DIRECTED 100 each 12  . ONETOUCH DELICA LANCETS 96P MISC Daily for Glucose Testing 300 each 1  . propranolol ER (INDERAL LA) 80 MG 24 hr capsule TAKE 1 CAPSULE BY MOUTH DAILY 90 capsule 3  . sertraline (ZOLOFT) 100 MG tablet Take 100 mg by mouth daily.     . traMADol (ULTRAM) 50 MG tablet TAKE 1-2 TABLETS BY MOUTH TWICE DAILY AS NEEDED FOR PAIN 120 tablet 2   Facility-Administered Medications Prior to Visit  Medication Dose Route Frequency Provider Last Rate Last Dose  . cyanocobalamin ((VITAMIN B-12)) injection 1,000 mcg  1,000  mcg Intramuscular Once Mariel Aloe, MD        PAST MEDICAL HISTORY: Past Medical History:  Diagnosis Date  . Allergy   . Anxiety   . Arthritis    "knees, back" (06/29/2014)  . Chronic bronchitis ([place])    "get it q yr"  . Chronic mid back pain   . Cognitive impairment 12/11/2013  . Complication of anesthesia 07/2008   "hard to get me woke up when I had my knee replaced; they said they had to bring me back"  . Esophageal stricture   . GERD (gastroesophageal reflux disease)   . History of gout 1970's  . Hyperlipidemia   . Hypertension   . Movement disorder   . Osteoarthritis   . Parkinson's disease ([place])   . Scoliosis   . Situational depression   . Type II diabetes mellitus ([place])   . UTI (lower urinary tract infection)     PAST SURGICAL HISTORY: Past Surgical History:  Procedure Laterality Date  . ABDOMINAL HYSTERECTOMY    . CHOLECYSTECTOMY OPEN    . COLON SURGERY    . DILATION AND CURETTAGE OF UTERUS    . ESOPHAGOGASTRIC FUNDOPLASTY     some type "esoph surgery" per pt  . JOINT REPLACEMENT    . TOTAL KNEE ARTHROPLASTY Right 07/2008  . TUBAL LIGATION      FAMILY HISTORY: Family History  Problem Relation Age of Onset  . Heart disease Mother   . Diabetes Mother   . Heart disease Father   . Heart attack Sister   . Heart attack Brother   . Cerebral palsy Son     SOCIAL HISTORY:  Social History   Social History  . Marital status: Married    Spouse name: [name]  . Number of children: 2  . Years of education: 14   Occupational History  . Retired Not Employed   Social History Main Topics  . Smoking status: Never Smoker  . Smokeless tobacco: Never Used  . Alcohol use No  . Drug use: No  . Sexual activity: Not on file   Other Topics Concern  . Not on file   Social History Narrative   Health Care POA:    Emergency Contact: daughter, [name] [phone] husband, [name] [phone]   End of Life Plan:   Who lives with you: Lives with husband and  son, [name]   Any pets: Rabbit, Cloyde Reams  Diet: Patient has a varied diet but struggles with what to eat with hypertension, diabetes, parkinsons   Exercise: Patient does not have any regular exercise routine.   Seatbelts: Patient reports wearing her seatbelt when in vehicle.   Nancy Fetter Exposure/Protection: Patient reports wearing sun block lotion daily.   Hobbies: Watching game shows, writing poetry, writing in journal, volunteering at day program with son.    Caffeine Use: very little occasional             PHYSICAL EXAM  Vitals:   09/09/16 1519  BP: 120/78  Pulse: 76  Weight: 174 lb 3.2 oz (79 kg)    Not recorded      Body mass index is 29.9 kg/m.  GENERAL EXAM: General: Patient is awake, alert and in no acute distress.  Well developed and groomed. Neck: Neck is supple. Cardiovascular: No carotid artery bruits.  Heart is regular rate and rhythm with no murmurs.  Neurologic Exam  Mental Status: Awake, alert. Language is fluent and comprehension intact. Cranial Nerves: Pupils are equal and reactive to light.  Visual fields are full to confrontation.  Conjugate eye movements are full and symmetric.  Facial sensation and strength are symmetric.  Hearing is intact.  Palate elevated symmetrically and uvula is midline.  Shoulder shrug is symmetric.  Tongue is midline. MODERATE OROLINGUAL DYSKINESIAS. Motor: INTERMITTENT RESTING TREMOR OF BUE AND MOUTH. MINIMAL POSTURAL TREMOR. BRADYKINESIA AND RIGIDITY IN LUE>RUE, LLE>RLE. Normal bulk. Full strength in the upper and lower extremities.  No pronator drift. Sensory: Intact and symmetric to light touch. Coordination: No ataxia or dysmetria on finger-nose or rapid alternating movement testing. Reflexes: BUE and BLE present and symmetric Gait and Station: Narrow based gait. DECR ARM SWING. DYSTONIA POSTURING OF HAND WHILE WALKING   DIAGNOSTIC DATA (LABS, IMAGING, TESTING) - I reviewed patient records, labs, notes, testing and imaging  myself where available.  Lab Results  Component Value Date   WBC 9.8 07/28/2014   HGB 14.4 07/28/2014   HCT 42.0 07/28/2014   MCV 89.2 07/28/2014   PLT 429 (H) 07/28/2014      Component Value Date/Time   NA 141 02/27/2016 1029   K 4.0 02/27/2016 1029   CL 104 02/27/2016 1029   CO2 27 02/27/2016 1029   GLUCOSE 96 02/27/2016 1029   BUN 17 02/27/2016 1029   CREATININE 1.07 (H) 02/27/2016 1029   CALCIUM 9.8 02/27/2016 1029   PROT 7.1 02/27/2016 1029   ALBUMIN 4.3 02/27/2016 1029   AST 22 02/27/2016 1029   ALT 5 (L) 02/27/2016 1029   ALKPHOS 90 02/27/2016 1029   BILITOT 0.5 02/27/2016 1029   GFRNONAA 53 (L) 02/27/2016 1029   GFRAA 61 02/27/2016 1029   Lab Results  Component Value Date   CHOL 244 (H) 06/07/2014   HDL 38.60 (L) 06/07/2014   LDLCALC 123 (H) 12/11/2013   LDLDIRECT 179 (H) 02/27/2016   TRIG 384.0 (H) 06/07/2014   CHOLHDL 6 06/07/2014   Lab Results  Component Value Date   HGBA1C 5.8 02/27/2016   Lab Results  Component Value Date   VITAMINB12 301 01/05/2008   Lab Results  Component Value Date   TSH 1.174 02/14/2015    07/05/10 MRI BRAIN - minimal scattered chronic small vessel ischemic disease   ASSESSMENT AND PLAN  71 y.o. female with resting tremor of BUE, cogwheel rigidity, bradykinesia, decreased arm swing, anxiety, diff swallowing, memory loss. Some progression of symptoms and increased falls.  Dx:  Parkinson disease ([place])  Abnormality of  gait  Dyskinesia  Gait difficulty    PLAN:  I spent 25 minutes of face to face time with patient. Greater than 50% of time was spent in counseling and coordination of care with patient. In summary we discussed:   PARKINSON'S DISEASE (established problem, worsening) - continue carb/levo (25/100) 1 tab twice a day up to 2 tabs three times per day (patient will try to stay better with dosing) - recommend more supervision at home with medications and ADLs - use rollator walker (not using that much  currently) - consider ramp for house steps - follow up with PT evaluation  No orders of the defined types were placed in this encounter.  No orders of the defined types were placed in this encounter.  No Follow-up on file.    Penni Bombard, MD 04/16/1115, 3:56 PM Certified in Neurology, Neurophysiology and Neuroimaging  [facility] [address] [place], [place] [address] [phone]

## 2016-09-23 ENCOUNTER — Ambulatory Visit (INDEPENDENT_AMBULATORY_CARE_PROVIDER_SITE_OTHER): Payer: Medicare HMO | Admitting: Family Medicine

## 2016-09-23 ENCOUNTER — Encounter: Payer: Self-pay | Admitting: Family Medicine

## 2016-09-23 VITALS — BP 116/72 | HR 68 | Temp 97.7°F | Ht 63.5 in | Wt 184.0 lb

## 2016-09-23 DIAGNOSIS — G8929 Other chronic pain: Secondary | ICD-10-CM | POA: Diagnosis not present

## 2016-09-23 DIAGNOSIS — M25562 Pain in left knee: Secondary | ICD-10-CM

## 2016-09-23 DIAGNOSIS — M1712 Unilateral primary osteoarthritis, left knee: Secondary | ICD-10-CM | POA: Diagnosis not present

## 2016-09-23 MED ORDER — METHYLPREDNISOLONE ACETATE 40 MG/ML IJ SUSP
40.0000 mg | Freq: Once | INTRAMUSCULAR | Status: AC
  Administered 2016-09-23: 40 mg via INTRAMUSCULAR

## 2016-09-23 NOTE — Assessment & Plan Note (Signed)
Patient is here for gradual worsening osteoarthritic pain in her left knee. She is status post right knee total arthroplasty. She regularly receives intra-articular corticosteroid injections, with her most recent being in January of this year. No red flag symptoms at this time. Patient desires a steroid injection today. - Provided a intra-articular corticosteroid injection into her left knee today. Patient tolerated procedure well. - Return precautions and warning signs discussed with patient and husband. - Follow-up when necessary

## 2016-09-23 NOTE — Progress Notes (Signed)
   HPI  CC: Knee Pain, Left Patient is here for chronic left-sided knee pain. She states that she regularly receives intra-articular corticosteroid injections by her PCP. She states that her pain is similar to its normal pain. She states that she has had no new falls or, trauma, or injury to this area. Her long-standing arthritis has been treated "since I was 54" with corticosteroid injections. Patient is hopeful to receive an additional injection today. Her last knee injection was in January of this year. She denies any fever, chills, swelling, erythema, reduced range of motion, mechanical symptoms, weakness, numbness, or paresthesias.   Of note: Patient has a prior diagnosis of Parkinson's disease.  Review of Systems See HPI for ROS.   CC, SH/smoking status, and VS noted  Objective: BP 116/72   Pulse 68   Temp 97.7 F (36.5 C) (Oral)   Ht 5' 3.5" (1.613 m)   Wt 184 lb (83.5 kg)   SpO2 96%   BMI 32.08 kg/m  Gen: NAD, alert, cooperative. CV: Well-perfused. Resp: Non-labored. Neuro: Sensation intact throughout. Knee, Left: Inspection was negative for erythema, negative for effusion, and positive for obvious bony abnormalities (w/ palpable osteophyte development along the joint line bilaterally). Palpation was negative for obvious Baker's cyst development, negative for asymmetric warmth, positive for joint line tenderness, negative for condyle tenderness, negative for patellar tenderness, positive for patellar crepitus, and negative for tenderness of the pes anserine bursa. Patellar and quadriceps tendons unremarkable. ROM at baseline per patient but slightly limited in flexion and extension. Neurovascularly intact bilaterally.  INJECTION: Left knee Patient was given informed consent, signed copy in the chart. Appropriate time out was taken. Area prepped and draped in usual sterile fashion. 1 cc of methylprednisolone 40 mg/ml plus  4 cc of 1% lidocaine without epinephrine was injected  into the left knee using a(n) inferior anterolateral approach. The patient tolerated the procedure well. There were no complications. Post procedure instructions were given.   Assessment and plan:  Osteoarthritis of multiple joints Patient is here for gradual worsening osteoarthritic pain in her left knee. She is status post right knee total arthroplasty. She regularly receives intra-articular corticosteroid injections, with her most recent being in January of this year. No red flag symptoms at this time. Patient desires a steroid injection today. - Provided a intra-articular corticosteroid injection into her left knee today. Patient tolerated procedure well. - Return precautions and warning signs discussed with patient and husband. - Follow-up when necessary   Meds ordered this encounter  Medications  . methylPREDNISolone acetate (DEPO-MEDROL) injection 40 mg     Elberta Leatherwood, MD,MS,  PGY3 09/23/2016 5:22 PM

## 2016-09-26 ENCOUNTER — Other Ambulatory Visit: Payer: Self-pay

## 2016-09-26 NOTE — Patient Outreach (Signed)
    Unsuccessful attempt made to contact patient via telephone for community care coordination.  Plan: Make another attempt to contact patient in the next 21 days to assess for additional community care coordination needs.

## 2016-09-29 ENCOUNTER — Telehealth: Payer: Self-pay | Admitting: *Deleted

## 2016-09-29 ENCOUNTER — Other Ambulatory Visit: Payer: Self-pay | Admitting: *Deleted

## 2016-09-29 MED ORDER — ACCU-CHEK AVIVA PLUS W/DEVICE KIT: PACK | 0 refills | Status: DC

## 2016-09-29 MED ORDER — OMEPRAZOLE 40 MG PO CPDR: DELAYED_RELEASE_CAPSULE | ORAL | 3 refills | Status: DC

## 2016-09-29 NOTE — Telephone Encounter (Signed)
Received call back from patient inquiring why this RN called her. This RN stated a fax from [facility] was received asking to Rx her a 90 day supply of carb-levo. Advised patient Dr Leta Baptist refilled it x 1 year in Nov 2017 to [facility]. This RN asked if she is getting med from [facility] because if so, this fax will be disregarded. Patient stated she would have to look into her pharmacy papers and call back.

## 2016-09-29 NOTE — Telephone Encounter (Signed)
Attempted to reach patient to discuss Rx request received via fax from [facility]. Her brother answered the phone , stated she was unavailable. This RN stated she will call back.

## 2016-09-29 NOTE — Telephone Encounter (Signed)
Patient called back and spoke with phone staff. She informed phone staff she did not need a refill of Carb-Levo. This RN will disregard fax from [facility].

## 2016-09-29 NOTE — Telephone Encounter (Signed)
Pt returned Rn's call. She does not need refill

## 2016-09-30 ENCOUNTER — Other Ambulatory Visit: Payer: Self-pay | Admitting: Family Medicine

## 2016-09-30 ENCOUNTER — Other Ambulatory Visit: Payer: Self-pay | Admitting: *Deleted

## 2016-09-30 DIAGNOSIS — I10 Essential (primary) hypertension: Secondary | ICD-10-CM

## 2016-09-30 MED ORDER — METFORMIN HCL 850 MG PO TABS: ORAL_TABLET | ORAL | 3 refills | Status: DC

## 2016-09-30 MED ORDER — OLMESARTAN MEDOXOMIL 5 MG PO TABS: 10.0000 mg | ORAL_TABLET | Freq: Every day | ORAL | 3 refills | Status: DC

## 2016-09-30 MED ORDER — ATORVASTATIN CALCIUM 20 MG PO TABS: 20.0000 mg | ORAL_TABLET | Freq: Every day | ORAL | 3 refills | Status: DC

## 2016-09-30 MED ORDER — ASPIRIN 81 MG PO TABS: 81.0000 mg | ORAL_TABLET | Freq: Every day | ORAL | 3 refills | Status: DC

## 2016-09-30 NOTE — Telephone Encounter (Signed)
Received call back from patient. She stated that she doesn't like getting "so many pills at one time". She did not recall requesting [employer] to send 90 day request to Dr Leta Baptist. She stated she is fine getting carb-levo from [facility]. This RN advised her that she should let us know if she changes her mind about the pharmacy. Patient verbalized understanding.

## 2016-09-30 NOTE — Telephone Encounter (Addendum)
Received second fax from [facility] requesting  90 day supply of carb-levo. [facility] and spoke with Sharrie Rothman, Education administrator. She stated they sent out request because the patient requested they send it. Sharrie Rothman stated typically mail order medications save the patient significant money.  Will call patient for clarification. LVM requesting patient call back.

## 2016-09-30 NOTE — Progress Notes (Signed)
Multiple attempts to getthese refilled Now we are faxing them in [name]

## 2016-10-03 ENCOUNTER — Other Ambulatory Visit: Payer: Self-pay

## 2016-10-03 NOTE — Patient Outreach (Signed)
    This RNCM made telephone contact with patient to see if she has any additional care coordination needs. Patient advises this RNCM she has had no falls, has been measuring his blood glucose daily. Patient reports having post meal glucose levels in the 120's.  Patient denies need for further community care coordination needs at this time.

## 2016-10-21 ENCOUNTER — Ambulatory Visit: Payer: Self-pay

## 2016-10-21 ENCOUNTER — Encounter: Payer: Self-pay | Admitting: Family Medicine

## 2016-11-14 ENCOUNTER — Ambulatory Visit (INDEPENDENT_AMBULATORY_CARE_PROVIDER_SITE_OTHER): Payer: Medicare HMO | Admitting: Family Medicine

## 2016-11-14 ENCOUNTER — Encounter: Payer: Self-pay | Admitting: Family Medicine

## 2016-11-14 VITALS — BP 136/70 | HR 74 | Temp 98.5°F | Ht 63.5 in | Wt 193.0 lb

## 2016-11-14 DIAGNOSIS — R0602 Shortness of breath: Secondary | ICD-10-CM | POA: Diagnosis not present

## 2016-11-14 DIAGNOSIS — R0609 Other forms of dyspnea: Secondary | ICD-10-CM | POA: Insufficient documentation

## 2016-11-14 DIAGNOSIS — M7989 Other specified soft tissue disorders: Secondary | ICD-10-CM | POA: Insufficient documentation

## 2016-11-14 DIAGNOSIS — R0600 Dyspnea, unspecified: Secondary | ICD-10-CM | POA: Insufficient documentation

## 2016-11-14 LAB — POCT HEMOGLOBIN: Hemoglobin: 10.8 g/dL — AB (ref 12.2–16.2)

## 2016-11-14 MED ORDER — FUROSEMIDE 40 MG PO TABS: 40.0000 mg | ORAL_TABLET | Freq: Every day | ORAL | 3 refills | Status: DC

## 2016-11-14 NOTE — Progress Notes (Signed)
Subjective:  [name] is a 71 y.o. female who presents to the [facility] today with a chief complaint of leg swelling and SOB   HPI:  LEG SWELLING  Having leg swelling for 7 days. Location: feet, hands and ankles Timing of swelling: came about after road trip New medications: none History of Kidney problems: CKD 3 History of Heart problems: HTN History of Liver problems: none History of cancer: none Medication changes:   Symptoms Chest pain: none Shortness of breath: yes, with exertion (around 6 mo) Immobility: uses walker Black or bloody bowel movements: fecal occult pos for blood Severe snoring or day time sleepiness: no Abdomen swelling: reportedly gained 9 pounds History of blood clots: none Weight Loss: none  ROS see HPI Smoking Status noted   Objective:  Physical Exam: BP 136/70   Pulse 74   Temp 98.5 F (36.9 C) (Oral)   Ht 5' 3.5" (1.613 m)   Wt 193 lb (87.5 kg)   SpO2 97%   BMI 33.65 kg/m   Gen: 70yo F in NAD, resting comfortably HEENT: bilateral conjunctival pallor CV: RRR with no murmurs appreciated Pulm: NWOB, CTAB with faint bibasilar, wheezes, or rhonchi GI: Normal bowel sounds present. Soft, Nontender, Nondistended. MSK: no edema, cyanosis, or clubbing noted Extremities: 1+ pitting edema bilateral feet to mid shin Skin: warm, dry Neuro: grossly normal, moves all extremities Psych: Normal affect and thought content  Results for orders placed or performed in visit on 11/14/16 (from the past 72 hour(s))  POCT hemoglobin     Status: Abnormal   Collection Time: 11/14/16 11:56 AM  Result Value Ref Range   Hemoglobin 10.8 (A) 12.2 - 16.2 g/dL     Assessment/Plan:  Swelling of lower extremity New onset swelling of bilateral LE including feet to mid-shin about 1+ and associated SOB.  No crackles on exam and no history of heart failure or COPD/smoking.  Did report positive fecal occult home test recently. POC hemoglobin today 10.8 and  historically was 14.4 07/2014 and then 12.2 05/2014.  Unclear what her current baseline is.  Additionally has conjunctival pallor.  No obvious blood in stool and no excessive NSAID use. Per patient she had a colonoscopy with polyps in the past, but no note in chart. Appears to be overdue for exam.  - compression stockings, elevate legs and avoid sodium in diet - follow up in one week - TSH, CBC, BMP  Shortness of breath SOB with exertion for the past 6 months. No associated LE swelling until this past week.  Last echo in 2015 showed G1DD, but normal EF.  No smoking history.  Did have road trip last week, but no associated chest pain, tachycardia, cough or hemoptysis. Swelling is bilateral and extremities are non-tender without erythema and not warm.  Hemoglobin low today to 10.8 and conjunctival pallor.  Unclear what baseline her baseline Hgb.  Also with weight gain of 9 pounds over past 2 months. This is also complicated because of her history of parkinson's and persistent n/v associated with medication.  Recently she has had improvement in these symptoms and appetite has improved.  She believes diet may have contributed to weight gain.  Previously on 40mg  lasix for LE edema, but this was discontinued.   - CMP - holding on lasix for now  - follow up in one week - ferritin; if low will prescribe iron supplement  Positive fecal occult blood test: Reported by patient.  - colonoscopy - follow up  CBC, ferritin - iron supplementation

## 2016-11-14 NOTE — Assessment & Plan Note (Addendum)
New onset swelling of bilateral LE including feet to mid-shin about 1+ and associated SOB.  No crackles on exam and no history of heart failure or COPD/smoking.  Did report positive fecal occult home test recently. POC hemoglobin today 10.8 and historically was 14.4 07/2014 and then 12.2 05/2014.  Unclear what her current baseline is.  Additionally has conjunctival pallor.  No obvious blood in stool and no excessive NSAID use. Per patient she had a colonoscopy with polyps in the past, but no note in chart. Appears to be overdue for exam.  - compression stockings, elevate legs and avoid sodium in diet - follow up in one week - TSH, CBC, BMP

## 2016-11-14 NOTE — Patient Instructions (Signed)
[name], you were seen today for shortness of breath and some leg swelling for a week.  Your lungs sound good on exam and I am less worried about this being your heart.  I am concerned about you finding blood in your stool on the home stool test and your hemoglobin is a little on the lower side today on exam.    I am checking some labs today to rule out some other causes of your shortness of breath.   I want you to start taking some iron daily and to elevate your legs and pick up some compression stockings at the store.  If this does not improve your swelling, I would recommend trying some lasix.    Please follow up in one week.  If you develop any worsening shortness of breath or chest pain please call the after hours line.   Very nice seeing you today, Tarrin Menn L. Rosalyn Gess, [place] Resident PGY-2 11/14/2016 12:25 PM

## 2016-11-14 NOTE — Assessment & Plan Note (Addendum)
SOB with exertion for the past 6 months. No associated LE swelling until this past week.  Last echo in 2015 showed G1DD, but normal EF.  No smoking history.  Did have road trip last week, but no associated chest pain, tachycardia, cough or hemoptysis. Swelling is bilateral and extremities are non-tender without erythema and not warm.  Hemoglobin low today to 10.8 and conjunctival pallor.  Unclear what baseline her baseline Hgb.  Also with weight gain of 9 pounds over past 2 months. This is also complicated because of her history of parkinson's and persistent n/v associated with medication.  Recently she has had improvement in these symptoms and appetite has improved.  She believes diet may have contributed to weight gain.  Previously on 40mg  lasix for LE edema, but this was discontinued.   - CMP - holding on lasix for now  - follow up in one week - ferritin; if low will prescribe iron supplement

## 2016-11-15 LAB — CMP14+EGFR
ALT: 9 IU/L (ref 0–32)
AST: 17 IU/L (ref 0–40)
Albumin/Globulin Ratio: 1.5 (ref 1.2–2.2)
Albumin: 4 g/dL (ref 3.5–4.8)
Alkaline Phosphatase: 116 IU/L (ref 39–117)
BUN/Creatinine Ratio: 16 (ref 12–28)
BUN: 16 mg/dL (ref 8–27)
Bilirubin Total: 0.2 mg/dL (ref 0.0–1.2)
CO2: 24 mmol/L (ref 20–29)
Calcium: 9.2 mg/dL (ref 8.7–10.3)
Chloride: 105 mmol/L (ref 96–106)
Creatinine, Ser: 1.02 mg/dL — ABNORMAL HIGH (ref 0.57–1.00)
GFR calc Af Amer: 64 mL/min/{1.73_m2} (ref >59)
GFR calc non Af Amer: 55 mL/min/{1.73_m2} — ABNORMAL LOW (ref >59)
Globulin, Total: 2.7 g/dL (ref 1.5–4.5)
Glucose: 113 mg/dL — ABNORMAL HIGH (ref 65–99)
Potassium: 4.5 mmol/L (ref 3.5–5.2)
Sodium: 145 mmol/L — ABNORMAL HIGH (ref 134–144)
Total Protein: 6.7 g/dL (ref 6.0–8.5)

## 2016-11-15 LAB — TSH: TSH: 2.31 u[IU]/mL (ref 0.450–4.500)

## 2016-11-15 LAB — CBC
Hematocrit: 36.7 % (ref 34.0–46.6)
Hemoglobin: 12.1 g/dL (ref 11.1–15.9)
MCH: 30.6 pg (ref 26.6–33.0)
MCHC: 33 g/dL (ref 31.5–35.7)
MCV: 93 fL (ref 79–97)
Platelets: 305 10*3/uL (ref 150–379)
RBC: 3.95 x10E6/uL (ref 3.77–5.28)
RDW: 13.7 % (ref 12.3–15.4)
WBC: 8.4 10*3/uL (ref 3.4–10.8)

## 2016-11-15 LAB — FERRITIN: Ferritin: 112 ng/mL (ref 15–150)

## 2016-11-17 ENCOUNTER — Encounter: Payer: Self-pay | Admitting: Student

## 2016-11-21 ENCOUNTER — Ambulatory Visit: Payer: Medicare HMO | Admitting: Student

## 2016-11-26 ENCOUNTER — Encounter: Payer: Self-pay | Admitting: Family Medicine

## 2016-11-26 ENCOUNTER — Ambulatory Visit (INDEPENDENT_AMBULATORY_CARE_PROVIDER_SITE_OTHER): Payer: Medicare HMO | Admitting: Family Medicine

## 2016-11-26 ENCOUNTER — Ambulatory Visit (HOSPITAL_COMMUNITY)
Admission: RE | Admit: 2016-11-26 | Discharge: 2016-11-26 | Disposition: A | Discharge status: Home / Self Care | Payer: Medicare HMO | Source: Ambulatory Visit | Attending: Family Medicine | Admitting: Family Medicine

## 2016-11-26 ENCOUNTER — Other Ambulatory Visit: Payer: Self-pay

## 2016-11-26 VITALS — BP 126/82 | HR 117 | Temp 98.1°F | Ht 64.0 in | Wt 189.2 lb

## 2016-11-26 DIAGNOSIS — R9431 Abnormal electrocardiogram [ECG] [EKG]: Secondary | ICD-10-CM | POA: Diagnosis not present

## 2016-11-26 DIAGNOSIS — R000 Tachycardia, unspecified: Secondary | ICD-10-CM | POA: Diagnosis not present

## 2016-11-26 DIAGNOSIS — E119 Type 2 diabetes mellitus without complications: Secondary | ICD-10-CM | POA: Diagnosis not present

## 2016-11-26 DIAGNOSIS — Z9189 Other specified personal risk factors, not elsewhere classified: Secondary | ICD-10-CM | POA: Diagnosis not present

## 2016-11-26 LAB — POCT GLYCOSYLATED HEMOGLOBIN (HGB A1C): Hemoglobin A1C: 5.8

## 2016-11-26 NOTE — Patient Instructions (Addendum)
STOP your metformin. Your A1c is extremely well controlled.  Take your propranolol daily. Take as little of your pain medication as you need. See me back in 2 weeks. I will send you a note about your blood work that we're checking today.

## 2016-11-26 NOTE — Progress Notes (Signed)
poct a1c

## 2016-11-27 ENCOUNTER — Encounter: Payer: Self-pay | Admitting: Family Medicine

## 2016-11-27 LAB — COMPREHENSIVE METABOLIC PANEL
ALT: 9 IU/L (ref 0–32)
AST: 28 IU/L (ref 0–40)
Albumin/Globulin Ratio: 1.7 (ref 1.2–2.2)
Albumin: 4.5 g/dL (ref 3.5–4.8)
Alkaline Phosphatase: 107 IU/L (ref 39–117)
BUN/Creatinine Ratio: 19 (ref 12–28)
BUN: 22 mg/dL (ref 8–27)
Bilirubin Total: 0.4 mg/dL (ref 0.0–1.2)
CO2: 21 mmol/L (ref 20–29)
Calcium: 9.8 mg/dL (ref 8.7–10.3)
Chloride: 102 mmol/L (ref 96–106)
Creatinine, Ser: 1.17 mg/dL — ABNORMAL HIGH (ref 0.57–1.00)
GFR calc Af Amer: 54 mL/min/{1.73_m2} — ABNORMAL LOW (ref >59)
GFR calc non Af Amer: 47 mL/min/{1.73_m2} — ABNORMAL LOW (ref >59)
Globulin, Total: 2.6 g/dL (ref 1.5–4.5)
Glucose: 122 mg/dL — ABNORMAL HIGH (ref 65–99)
Potassium: 4.3 mmol/L (ref 3.5–5.2)
Sodium: 143 mmol/L (ref 134–144)
Total Protein: 7.1 g/dL (ref 6.0–8.5)

## 2016-11-27 LAB — TSH: TSH: 2.26 u[IU]/mL (ref 0.450–4.500)

## 2016-11-27 LAB — CBC
Hematocrit: 41.6 % (ref 34.0–46.6)
Hemoglobin: 13.8 g/dL (ref 11.1–15.9)
MCH: 30.9 pg (ref 26.6–33.0)
MCHC: 33.2 g/dL (ref 31.5–35.7)
MCV: 93 fL (ref 79–97)
Platelets: 290 10*3/uL (ref 150–379)
RBC: 4.47 x10E6/uL (ref 3.77–5.28)
RDW: 14.1 % (ref 12.3–15.4)
WBC: 8.6 10*3/uL (ref 3.4–10.8)

## 2016-11-28 NOTE — Progress Notes (Signed)
    CHIEF COMPLAINT / HPI: Feeling fatigued. Also had some bnausea thsi morning. [place] when she got p but took her medicines on empty stomach and is now having nausea. Did not take her propranolol yesterday or today because she was 'tired". No chect pain but is having occaional irregular beat.  REVIEW OF SYSTEMS:  See HPI  OBJECTIVE:   Vital signs reviewed GENERALl: Well developed, well nourished, in no acute distress. HEENT: PERRLA, EOMI, sclerae are nonicteric NECK: Supple, FROM, without lymphadenopathy.  THYROID: normal without nodularity CAROTID ARTERIES: without bruits LUNGS: clear to auscultation bilaterally. No wheezes or rales. Normal respiratory effort HEART: Regular rate and rhythm, no murmurs. Distal pulses are bilaterally symmetrical, 2+. ABDOMEN: soft with positive bowel sounds. No masses noted MSK: MOE x 4. Normal muscle strength, bulk and tone. SKIN no rash. Normal temperature. NEURO: no focal deficits. Resting parkinsonian tremor. Antalgic broad based gait. Can get on and off table without assistance   ASSESSMENT / PLAN: 1. Fatigue and palpitations. Noncompliance with beta blocker.  EKG Sinus tach.  Discussed medication regimen and compliance. Greater than 50% of our 25 minute office visit was spent in counseling and education regarding these issues. Will check some labs. F/u 2 weeks

## 2016-12-03 ENCOUNTER — Other Ambulatory Visit: Payer: Self-pay

## 2016-12-05 NOTE — Patient Outreach (Signed)
    Acute care home visit after patient called to this RNCM to say [facility] was refusing to send her medication. RNCM made call to [facility] mail order pharmacy This RNCM learned and informed patient and [name] is filling only one medication, Omepazole and medication was filled less than 30 days ago. This RNCM reviewed medications with patient who has all her medications. This RNCM advised patient her medications came from [facility] at [facility]. Patient states she does not want her medications moved to [facility].  This RNCM assisted patient and her husband in reordering their diabetic testing supplies.

## 2016-12-10 ENCOUNTER — Ambulatory Visit: Payer: Self-pay | Admitting: Family Medicine

## 2016-12-17 ENCOUNTER — Ambulatory Visit (INDEPENDENT_AMBULATORY_CARE_PROVIDER_SITE_OTHER): Payer: Medicare HMO | Admitting: Family Medicine

## 2016-12-17 ENCOUNTER — Encounter: Payer: Self-pay | Admitting: Family Medicine

## 2016-12-17 VITALS — BP 152/96 | HR 75 | Temp 98.1°F | Ht 64.0 in | Wt 188.0 lb

## 2016-12-17 DIAGNOSIS — M25562 Pain in left knee: Secondary | ICD-10-CM

## 2016-12-17 DIAGNOSIS — M719 Bursopathy, unspecified: Secondary | ICD-10-CM | POA: Diagnosis not present

## 2016-12-17 DIAGNOSIS — G8929 Other chronic pain: Secondary | ICD-10-CM

## 2016-12-17 DIAGNOSIS — M67919 Unspecified disorder of synovium and tendon, unspecified shoulder: Secondary | ICD-10-CM | POA: Diagnosis not present

## 2016-12-17 DIAGNOSIS — E119 Type 2 diabetes mellitus without complications: Secondary | ICD-10-CM | POA: Diagnosis not present

## 2016-12-17 DIAGNOSIS — E538 Deficiency of other specified B group vitamins: Secondary | ICD-10-CM

## 2016-12-17 LAB — GLUCOSE, POCT (MANUAL RESULT ENTRY): POC Glucose: 119 mg/dl — AB (ref 70–99)

## 2016-12-17 MED ORDER — CYANOCOBALAMIN 1000 MCG/ML IJ SOLN
1000.0000 ug | Freq: Once | INTRAMUSCULAR | Status: AC
  Administered 2016-12-17: 1000 ug via INTRAMUSCULAR

## 2016-12-17 MED ORDER — METHYLPREDNISOLONE ACETATE 80 MG/ML IJ SUSP
80.0000 mg | Freq: Once | INTRAMUSCULAR | Status: AC
  Administered 2016-12-17: 80 mg via INTRAMUSCULAR

## 2016-12-19 NOTE — Progress Notes (Signed)
    CHIEF COMPLAINT / HPI: Left knee pain, left shoulder pain. Wants injection in both. The pain gets better when she has injection for about 4-6 weeks and starts to hurt significantly again.  #2. Parkinson's: She seems to be doing pre-well right now. No recent falls. Continues taking her medication without problem #3. Hypertension: Sometimes she skips her medicine which she did this morning prior to coming to the office. Denies shortness of breath. No chest pain. No lower extremity edema.  REVIEW OF SYSTEMS:  No fever, no joint swelling or unusual arthralgias. She has her baseline arthralgias.  OBJECTIVE:  Vital signs reviewed. GENERAL: Well-developed, well-nourished, no acute distress. CARDIOVASCULAR: Regular rate and rhythm no murmur gallop or rub LUNGS: Clear to auscultation bilaterally, no rales or wheeze. ABDOMEN: Soft positive bowel sounds NEURO: Rigidity consistent with Parkinson's. Resting tremor. MSK: Movement of extremity x 4. KNEE: Left. Full range of motion flexion extension. Tender to palpation medial joint line. Calf is soft. SHOULDER: LEFT. FULL RANGE OF MOTION BUT PAIN WITH ABDUCTION AND FORWARD FLEXION ABOVE 90.  INJECTION: Patient was given informed consent, signed copy in the chart. Appropriate time out was taken. Area prepped and draped in usual sterile fashion. 1 cc of methylprednisolone 40 mg/ml plus  4 cc of 1% lidocaine without epinephrine was injected into the left knee using a(n) anterior medial approach. The patient tolerated the procedure well. There were no complications. Post procedure instructions were given. INJECTION: Patient was given informed consent, signed copy in the chart. Appropriate time out was taken. Area prepped and draped in usual sterile fashion. 1 cc of methylprednisolone 40 mg/ml plus  4 cc of 1% lidocaine without epinephrine was injected into the left subacromial bursa using a(n) posterior approach. The patient tolerated the procedure  well. There were no complications. Post procedure instructions were given.     ASSESSMENT / PLAN: #1. DJD left knee. CSI  today. She does not want to consider knee replacement even though the well-nourished had's doing fairly well. She had that knee done when she had much less in the way of risk factors so not sure total knee replacement would be in her best interest at this time. #2. Subacromial bursitis. We did an injection there today. We discussed some home exercise program. #3. Parkinson's: She seems to be doing pretty well right now. No medication changes. Continue follow-up with her neurologist.  I would like to see her back in a month or so's we can follow-up on her chronic issues.

## 2016-12-25 ENCOUNTER — Other Ambulatory Visit: Payer: Self-pay

## 2016-12-25 NOTE — Patient Outreach (Signed)
[name]) Care Management  12/25/2016  [name] 28-Apr-1945 [id number]   Care Coordination: RNCM received referral for follow up on 12/25/16. RNCM called to arrange date for home visit. She reports her diabetes is controlled adding she has been taken off all medication for diabetes. She states she has not had any falls in the past month. Client without any complaints or issues at this time.  Plan: home visit arranged for next week.  Thea Silversmith, RN, MSN, [place] Coordinator Cell: [phone]

## 2016-12-30 ENCOUNTER — Other Ambulatory Visit: Payer: Self-pay

## 2016-12-30 NOTE — Patient Outreach (Signed)
[facility]) Care Management  12/30/2016  KARL KNARR [birth year] [id number]   Care Coordination: RNCM received call from client requesting to reschedule home visit. Mrs. Summerhill reports she has another appointment on that day.  Plan: RNCM rescheduled appointment for next week.  Thea Silversmith, RN, MSN, [place] Coordinator Cell: [phone]

## 2017-01-01 ENCOUNTER — Ambulatory Visit: Payer: Self-pay

## 2017-01-01 DIAGNOSIS — R69 Illness, unspecified: Secondary | ICD-10-CM | POA: Diagnosis not present

## 2017-01-06 ENCOUNTER — Other Ambulatory Visit: Payer: Self-pay

## 2017-01-06 NOTE — Patient Outreach (Signed)
[facility]) Care Management   01/06/2017  [name] 02-Jul-1945 [id number]  [name] is an 71 y.o. female  Subjective: client reports no falls in the past month.  Objective:  BP (!) 160/100   Pulse (!) 104   Resp 20   Ht 1.676 m (5' 6" )   Wt 179 lb (81.2 kg)   SpO2 96%   BMI 28.89 kg/m   Review of Systems  Respiratory:       Lungs clear.  Cardiovascular:       Heart rate regular    Physical Exam  Encounter Medications:   Outpatient Encounter Prescriptions as of 01/06/2017  Medication Sig Note  . aspirin 81 MG tablet Take 1 tablet (81 mg total) by mouth daily.   Marland Kitchen atorvastatin (LIPITOR) 20 MG tablet Take 1 tablet (20 mg total) by mouth daily.   . bisacodyl (DULCOLAX) 5 MG EC tablet Take 5 mg by mouth daily as needed for moderate constipation.   . carbidopa-levodopa (SINEMET IR) 25-100 MG tablet Take 1-2 tablets by mouth 3 (three) times daily. 08/12/2016: Patient only takes PRN due to it making her feel nauseated.  . fluticasone (FLONASE) 50 MCG/ACT nasal spray SHAKE LIQUID AND USE 2 SPRAYS IN EACH NOSTRIL DAILY   . furosemide (LASIX) 40 MG tablet Take 1 tablet by mouth daily.   Marland Kitchen gabapentin (NEURONTIN) 300 MG capsule Patient reported taking 2 capsules in the morning and 1 capsule in the evening. 01/06/2017: Does not take daily, but takes States takes as needed.   . loperamide (IMODIUM) 2 MG capsule Take 2 mg by mouth as needed for diarrhea or loose stools.   Marland Kitchen olmesartan (BENICAR) 5 MG tablet Take 2 tablets (10 mg total) by mouth daily.   Marland Kitchen omeprazole (PRILOSEC) 40 MG capsule TAKE 1 CAPSULE BY MOUTH EVERY DAY   . propranolol ER (INDERAL LA) 80 MG 24 hr capsule TAKE 1 CAPSULE BY MOUTH DAILY   . temazepam (RESTORIL) 15 MG capsule Take 1 capsule by mouth at bedtime. Per psychiatry   . traMADol (ULTRAM) 50 MG tablet TAKE 1-2 TABLETS BY MOUTH TWICE DAILY AS NEEDED FOR PAIN   . Blood Glucose Monitoring Suppl (ACCU-CHEK AVIVA PLUS) w/Device KIT Use  to check blood sugar daily. ICD-10 code: E11.9   . ONE TOUCH ULTRA TEST test strip USE AS DIRECTED   . ONETOUCH DELICA LANCETS 24M MISC Daily for Glucose Testing   . sertraline (ZOLOFT) 100 MG tablet Take 100 mg by mouth daily.     Facility-Administered Encounter Medications as of 01/06/2017  Medication  . cyanocobalamin ((VITAMIN B-12)) injection 1,000 mcg    Functional Status:   In your present state of health, do you have any difficulty performing the following activities: 01/06/2017 08/20/2016  Hearing? N N  Vision? N Y  Difficulty concentrating or making decisions? N Y  Walking or climbing stairs? Y Y  Dressing or bathing? Y Y  Doing errands, shopping? [place]pie Donning  Preparing Food and eating ? Y Y  Using the Toilet? N N  In the past six months, have you accidently leaked urine? Y Y  Do you have problems with loss of bowel control? Y Y  Managing your Medications? N N  Comment - friend helps with her medication, Shonna Chock  Managing your Finances? N Y  Housekeeping or managing your Housekeeping? N Y  Some recent data might be hidden    Fall/Depression Screening:    Fall Risk  01/06/2017 12/17/2016 11/14/2016  Falls in the past year? Yes Yes Yes  Number falls in past yr: 1 1 1   Comment - - -  Injury with Fall? No No No  Comment - - -  Risk Factor Category  - - -  Risk for fall due to : Impaired balance/gait;History of fall(s) - -  Follow up Falls prevention discussed - -   PHQ 2/9 Scores 01/06/2017 12/17/2016 11/26/2016 11/14/2016 09/23/2016 08/20/2016 08/12/2016  PHQ - 2 Score 0 0 0 0 0 0 0  PHQ- 9 Score - - - - - - -  Exception Documentation - - - - - - -  Not completed - - - - - - -    Assessment: RNCM received referral for follow up on 12/25/16.  71 year old with history of HTN, Parkinson's, UTI, depression, osteoarhtritis  RNCM completed home visit. Present during visit was husband and nursing student. Client without questions or concern.   Medications reviewed. No questions or  concerns noted.  Blood pressure elevated.RNCM discussed the importance of managing blood pressure. Mr. Percell Miller states he will get client an electric blood pressure cuff. RNCM discussed proper way to take blood pressure.  History of falls, client reports she does not have a problem with falling anymore. She reports her husband assist her with bathing. She reports she is more aware of her potential to fall.  Plan: home visit next month.   THN CM Care Plan Problem One     Most Recent Value  Care Plan Problem One  patient has had frequent falls  Role Documenting the Problem One  Care Management [place] for Problem One  Not Active    [facility] CM Care Plan Problem Two     Most Recent Value  Care Plan Problem Two  Patient is suppose to take her blood glucose once a week, has not been monitoring and recording  Role Documenting the Problem Two  Care Management Coordinator  Care Plan for Problem Two  Not Active  THN CM Short Term Goal #1   Patient will monitor and record glucose readings 3 out of 4  weeks   THN CM Short Term Goal #1 Start Date  08/20/16    [facility] CM Care Plan Problem Three     Most Recent Value  Care Plan Problem Three  knowledge deficit regarding HTN  Role Documenting the Problem Three  Care Management Coordinator  Care Plan for Problem Three  Active  THN Long Term Goal   client will verbalize at least three strategies for managing HTN within the next 31-60 days.  THN Long Term Goal Start Date  01/06/17  Interventions for Problem Three Long Term Goal  discussed importance of blood pressure management.      Thea Silversmith, RN, MSN, [place] Coordinator Cell: [phone]

## 2017-01-08 ENCOUNTER — Telehealth: Payer: Self-pay | Admitting: *Deleted

## 2017-01-08 NOTE — Telephone Encounter (Signed)
LMVM for pt to return call to r/s appt.

## 2017-01-12 ENCOUNTER — Ambulatory Visit: Payer: Medicare HMO | Admitting: Diagnostic Neuroimaging

## 2017-01-14 ENCOUNTER — Encounter: Payer: Self-pay | Admitting: Diagnostic Neuroimaging

## 2017-01-14 ENCOUNTER — Ambulatory Visit (INDEPENDENT_AMBULATORY_CARE_PROVIDER_SITE_OTHER): Payer: Medicare HMO | Admitting: Diagnostic Neuroimaging

## 2017-01-14 VITALS — BP 162/92 | HR 101 | Wt 183.0 lb

## 2017-01-14 DIAGNOSIS — R269 Unspecified abnormalities of gait and mobility: Secondary | ICD-10-CM

## 2017-01-14 DIAGNOSIS — G20 Parkinson's disease: Secondary | ICD-10-CM | POA: Diagnosis not present

## 2017-01-14 MED ORDER — CARBIDOPA-LEVODOPA 25-100 MG PO TABS: 1.0000 | ORAL_TABLET | Freq: Three times a day (TID) | ORAL | 4 refills | Status: DC

## 2017-01-14 NOTE — Progress Notes (Signed)
[facility]  PATIENT: [name] DOB: [birth year]  REFERRING CLINICIAN:  HISTORY FROM: patient and husband REASON FOR VISIT: follow up   HISTORICAL  CHIEF COMPLAINT:  Chief Complaint  Patient presents with  . Parkinson's disease    rm 59, husband- [name], "didn't check into PT, thought Dr Leta Baptist was to write a letter so she could continue, but per PT's note 06/13/16 she was to see Dr Nori Riis; pt stated she's had vbabck pain x 6-7 years"   . Follow-up    HISTORY OF PRESENT ILLNESS:   UPDATE (01/14/17, VRP): Since last visit, doing fair. More anger and rage issues, esp towards husband. Some nausea with carb/levo. Now on carb/levio 1.5 tabs TID, and better tremor control. No falls.  UPDATE 09/09/16 (VRP): Since last visit, no more falls. Now off any benzos or sleep aids. Using walker at times, but mainly walking stick. [facility] care mgmt has reached out to help patient. Now on carb/levo 1 / 1 / 0.5. Continues with nausea.   UPDATE 05/05/16 (MM): "She returns today after experiencing increased falls in the last 2 months. She states that some of the falls have been severe and she has fallen on her face. Fortunately she has not suffered any significant injuries. She does state that her left shoulder and neck is slightly tight after one of the falls. She is not using an assistive device. She states that she did purchase a walker last week. Her husband states that she tends to drag her feet when ambulating. Reports that she had physical therapy over a year ago. She is currently taking Sinemet 25-100 milligrams one and a half tablets 3 times a day. She takes her first dose at 8:30, second dose at 3 PM and last dose at 9:30 PM. She states that she can tell a difference if she misses a dose. Denies any changes in her sleep. She states that if she does not chew her food up well she will become choked. Denies getting choked on liquids. Reports that she does have a tremor in the  hands and mouth. She does note that she shuffles her feet when ambulating. She returns today for an evaluation."  UPDATE 03/12/16: Since last visit, continues to have falls; ~ 5 x in last 6 months. Not using cane or walker. Had episode of believing that she went to a party in [place], [place] (which she did not) and then told her family about it. No further delusions or hallucinations. Has been on lower carb/levo (1 tab twice a day) lately due to nausea.  UPDATE 09/03/15: Since last visit, stable. Did well with PT. Doing well with lower dose of carb/levo.   UPDATE 02/13/15: Since last visit, continues to have tremor, tongue and mouth movments (better), sweating, anxiety, inverted sleep schedule.   UPDATE 10/09/14: Since last visit, had increased carb/levo up to 2.5 tabs TID (even though I had recommended going up to 2 tabs TID; she's not sure why she did this). More tongue and mouth movements. More falls. Went to hospital for UTI, AKI, N/V/diarrhea In March 2016.   UPDATE 04/04/14: Since last visit, she reports that she is stable from PD standpoint. Tremor and gait are stable. Tolerating carb/levo 1.5 tabs TID. Hasn't tried going up to 2 tabs TID. Re: swallowing issues, she reports remote esophagus surgery for swallow issues 15 yrs ago. Feels that this is a problem again, and wants to see GI. Re: rash, it turns out that they had  bedbugs in their home; now problem is resolved. Struggling with diarrhea and excess sweating.  UPDATE 12/26/13: Since last visit, was hospitalized (2 weeks ago) for delirium, hallucinations, chest pain, UTI, and medication overuse. Was taking ambien and a friend's xanax to help her sleep (was struggling with anxiety and insomnia; not seen Dr. Casimiro Needle in a while). Now back at home and doing better. Tremor and gait are worsening. Some wearing off (early AM and late evening). Also with 2 weeks are night time itching and rash. Going to see dermatology soon.   UPDATE 01/18/13: Since last  visit, now on carb/levo 1.5 tabs TID. Nausea is improved since she started taking her metformin at different time than carb/levo. Tremor, balance, coordination are stable.  UPDATE 06/01/12: Since last visit, tried carb/levo 1.5 tabs TID x 1 week, then stopped. Felt like it was too much medicine. Did not have increased side effects. Has been struggling with nausea throughout. Tremor is persistent.  UPDATE 01/28/12: Doing well. No wearing off. Tolerating meds. Stopped azilect (per PCP for nausea). Now on fluoxetine for depression.  UPDATE 08/27/10: Doing better on carb/levo.  Taking 1 tab TID (6am, 4pm, 9pm).  Minimal nausea.  Wakes up with more tremor in AM, then reduction in tremor 35mn after dose.  Effect wears off around 4pm.  Daughter reports some intermittent confusion.  PRIOR HPI (06/26/10): 71yo [place] female [place] with concern for possible Parkinson's disease. Ms. EVanderslicenotes she first noticed a resting tremor in her left hand about 3 years ago, and this has gotten progressively worse since then. It has also spread to now involve her mouth and right hand as well, though she notes it is still worst in her left hand. Stress and anxiety can accentuate the tremors, whereas active use can reduce them. She also notes the tremors being worse in the morning. She denies noting anything else that seems to make the tremors better or worse. She admits to what seems like possible bradykinesia, in her words that she has "a hard time getting going sometimes," but denies any freezing. She does note some new difficulty with writing, but denies micrographia. She also admits to feeling like her balance and walking is "a bit off," but she relates this more to the osteoarthritis in her knees and having had surgery on her R knee. She denies orthostatic symptoms, hallucinations or delusions, difficulty standing or sitting, weakness, new visual changes (aside from her macular degeneration), or  feelings of rigidity. She also denies bizarre dreams, nightmares, RLS symptoms, or REM behavior disorder symptoms. She admits to having two uncles who have tremors, etiology unclear, but adds that one uncle had alcoholism (and it was believed this was the cause). She is concerned about what is causing her tremors, and admits that although she initially "put off" being evaluated further, she is anxious to know what might be the cause of her tremors.   REVIEW OF SYSTEMS: Full 14 system review of systems performed and negative except for: behavior issues depression nausea insomnia ringing in ears.   ALLERGIES: Allergies  Allergen Reactions  . Ace Inhibitors Anaphylaxis and Swelling  . Azilect [Rasagiline Mesylate] Other (See Comments)    hypotension  . Lisinopril Swelling    Severe facial angioedema requiring hospitalization 2007 (approx)    HOME MEDICATIONS: Outpatient Medications Prior to Visit  Medication Sig Dispense Refill  . amoxicillin (AMOXIL) 500 MG capsule Take 500 mg by mouth 3 (three) times daily. Takes prophylactic before dentist  appointments.    Marland Kitchen aspirin 81 MG tablet Take 1 tablet (81 mg total) by mouth daily. 90 tablet 3  . atorvastatin (LIPITOR) 20 MG tablet Take 1 tablet (20 mg total) by mouth daily. 90 tablet 3  . bisacodyl (DULCOLAX) 5 MG EC tablet Take 5 mg by mouth daily as needed for moderate constipation.    . Blood Glucose Monitoring Suppl (ACCU-CHEK AVIVA PLUS) w/Device KIT Use to check blood sugar daily. ICD-10 code: E11.9 1 kit 0  . carbidopa-levodopa (SINEMET IR) 25-100 MG tablet Take 1-2 tablets by mouth 3 (three) times daily. 450 tablet 4  . fluticasone (FLONASE) 50 MCG/ACT nasal spray SHAKE LIQUID AND USE 2 SPRAYS IN EACH NOSTRIL DAILY (Patient not taking: Reported on 01/14/2017) 16 g 12  . furosemide (LASIX) 40 MG tablet Take 1 tablet by mouth daily.  3  . gabapentin (NEURONTIN) 300 MG capsule Patient reported taking 2 capsules in the morning and 1 capsule in  the evening.    . loperamide (IMODIUM) 2 MG capsule Take 2 mg by mouth as needed for diarrhea or loose stools.    Marland Kitchen olmesartan (BENICAR) 5 MG tablet Take 2 tablets (10 mg total) by mouth daily. 180 tablet 3  . omeprazole (PRILOSEC) 40 MG capsule TAKE 1 CAPSULE BY MOUTH EVERY DAY 90 capsule 3  . ONE TOUCH ULTRA TEST test strip USE AS DIRECTED 100 each 12  . ONETOUCH DELICA LANCETS 91Y MISC Daily for Glucose Testing 300 each 1  . propranolol ER (INDERAL LA) 80 MG 24 hr capsule TAKE 1 CAPSULE BY MOUTH DAILY 90 capsule 3  . sertraline (ZOLOFT) 100 MG tablet Take 100 mg by mouth daily.     . temazepam (RESTORIL) 15 MG capsule Take 1 capsule by mouth at bedtime. Per psychiatry  1  . traMADol (ULTRAM) 50 MG tablet TAKE 1-2 TABLETS BY MOUTH TWICE DAILY AS NEEDED FOR PAIN 120 tablet 2   Facility-Administered Medications Prior to Visit  Medication Dose Route Frequency Provider Last Rate Last Dose  . cyanocobalamin ((VITAMIN B-12)) injection 1,000 mcg  1,000 mcg Intramuscular Once Mariel Aloe, MD        PAST MEDICAL HISTORY: Past Medical History:  Diagnosis Date  . Allergy   . Anxiety   . Arthritis    "knees, back" (06/29/2014)  . Chronic bronchitis ([place])    "get it q yr"  . Chronic mid back pain   . Cognitive impairment 12/11/2013  . Complication of anesthesia 07/2008   "hard to get me woke up when I had my knee replaced; they said they had to bring me back"  . Esophageal stricture   . GERD (gastroesophageal reflux disease)   . History of gout 1970's  . Hyperlipidemia   . Hypertension   . Movement disorder   . Osteoarthritis   . Parkinson's disease ([place])   . Scoliosis   . Situational depression   . Type II diabetes mellitus ([place])   . UTI (lower urinary tract infection)     PAST SURGICAL HISTORY: Past Surgical History:  Procedure Laterality Date  . ABDOMINAL HYSTERECTOMY    . CHOLECYSTECTOMY OPEN    . COLON SURGERY    . DILATION AND CURETTAGE OF UTERUS    . ESOPHAGOGASTRIC  FUNDOPLASTY     some type "esoph surgery" per pt  . JOINT REPLACEMENT    . TOTAL KNEE ARTHROPLASTY Right 07/2008  . TUBAL LIGATION      FAMILY HISTORY: Family History  Problem Relation Age  of Onset  . Heart disease Mother   . Diabetes Mother   . Heart disease Father   . Heart attack Sister   . Heart attack Brother   . Cerebral palsy Son     SOCIAL HISTORY:  Social History   Social History  . Marital status: Married    Spouse name: [name]  . Number of children: 2  . Years of education: 14   Occupational History  . Retired Not Employed   Social History Main Topics  . Smoking status: Never Smoker  . Smokeless tobacco: Never Used  . Alcohol use No  . Drug use: No  . Sexual activity: Not on file   Other Topics Concern  . Not on file   Social History Narrative   Health Care POA:    Emergency Contact: daughter, [name], [phone] husband, [name] [phone]   End of Life Plan:   Who lives with you: Lives with husband and son, [name]   Any pets: Rabbit, [name]   Diet: Patient has a varied diet but struggles with what to eat with hypertension, diabetes, parkinsons   Exercise: Patient does not have any regular exercise routine.   Seatbelts: Patient reports wearing her seatbelt when in vehicle.   Nancy Fetter Exposure/Protection: Patient reports wearing sun block lotion daily.   Hobbies: Watching game shows, writing poetry, writing in journal, volunteering at day program with son.    Caffeine Use: very little occasional             PHYSICAL EXAM  Vitals:   01/14/17 1019  BP: (!) 162/92  Pulse: (!) 101  Weight: 183 lb (83 kg)    Not recorded      Body mass index is 29.54 kg/m.  GENERAL EXAM: General: Patient is awake, alert and in no acute distress.  Well developed and groomed. Neck: Neck is supple. Cardiovascular: No carotid artery bruits.  Heart is regular rate and rhythm with no murmurs.  Neurologic Exam  Mental Status: Awake, alert. Language is  fluent and comprehension intact. Cranial Nerves: Pupils are equal and reactive to light.  Visual fields are full to confrontation.  Conjugate eye movements are full and symmetric.  Facial sensation and strength are symmetric.  Hearing is intact.  Palate elevated symmetrically and uvula is midline.  Shoulder shrug is symmetric.  Tongue is midline. NO OROLINGUAL DYSKINESIAS. Motor: MILD RESTING TREMOR OF BUE AND MOUTH. MINIMAL POSTURAL TREMOR. BRADYKINESIA AND RIGIDITY IN LUE>RUE, LLE>RLE. Normal bulk. Full strength in the upper and lower extremities.  No pronator drift. Sensory: Intact and symmetric to light touch. Coordination: No ataxia or dysmetria on finger-nose or rapid alternating movement testing. Reflexes: BUE and BLE present and symmetric Gait and Station: Narrow based gait. SMOOTH GAIT, SLIGHT DECR ARM SWING.   DIAGNOSTIC DATA (LABS, IMAGING, TESTING) - I reviewed patient records, labs, notes, testing and imaging myself where available.  Lab Results  Component Value Date   WBC 8.6 11/26/2016   HGB 13.8 11/26/2016   HCT 41.6 11/26/2016   MCV 93 11/26/2016   PLT 290 11/26/2016      Component Value Date/Time   NA 143 11/26/2016 1043   K 4.3 11/26/2016 1043   CL 102 11/26/2016 1043   CO2 21 11/26/2016 1043   GLUCOSE 122 (H) 11/26/2016 1043   GLUCOSE 96 02/27/2016 1029   BUN 22 11/26/2016 1043   CREATININE 1.17 (H) 11/26/2016 1043   CREATININE 1.07 (H) 02/27/2016 1029   CALCIUM 9.8 11/26/2016 1043   PROT 7.1  11/26/2016 1043   ALBUMIN 4.5 11/26/2016 1043   AST 28 11/26/2016 1043   ALT 9 11/26/2016 1043   ALKPHOS 107 11/26/2016 1043   BILITOT 0.4 11/26/2016 1043   GFRNONAA 47 (L) 11/26/2016 1043   GFRNONAA 53 (L) 02/27/2016 1029   GFRAA 54 (L) 11/26/2016 1043   GFRAA 61 02/27/2016 1029   Lab Results  Component Value Date   CHOL 244 (H) 06/07/2014   HDL 38.60 (L) 06/07/2014   LDLCALC 123 (H) 12/11/2013   LDLDIRECT 179 (H) 02/27/2016   TRIG 384.0 (H) 06/07/2014    CHOLHDL 6 06/07/2014   Lab Results  Component Value Date   HGBA1C 5.8 11/26/2016   Lab Results  Component Value Date   VITAMINB12 301 01/05/2008   Lab Results  Component Value Date   TSH 2.260 11/26/2016    07/05/10 MRI BRAIN - minimal scattered chronic small vessel ischemic disease   ASSESSMENT AND PLAN  71 y.o. female with resting tremor of BUE, cogwheel rigidity, bradykinesia, decreased arm swing, anxiety, diff swallowing, memory loss. Some progression of symptoms and increased falls.  Dx:  Parkinson disease ([place])  Abnormality of gait    PLAN:  PARKINSON'S DISEASE (established problem, worsening) - continue carb/levo (25/100) --> try 1.5 tabs three times a day or 1 tab 4-5 times per day - recommend more supervision at home with medications and ADLs - use rollator walker - continue PT evaluations - follow up anger issues with Dr. Casimiro Needle  Meds ordered this encounter  Medications  . carbidopa-levodopa (SINEMET IR) 25-100 MG tablet    Sig: Take 1-2 tablets by mouth 3 (three) times daily.    Dispense:  540 tablet    Refill:  4   Return in about 6 months (around 07/15/2017).    Penni Bombard, MD 36/09/2945, 65:46 AM Certified in Neurology, Neurophysiology and Neuroimaging  [facility] [address], [place], [place] [address] [phone]

## 2017-01-14 NOTE — Telephone Encounter (Signed)
Being seen today 

## 2017-01-20 DIAGNOSIS — F3341 Major depressive disorder, recurrent, in partial remission: Secondary | ICD-10-CM | POA: Diagnosis not present

## 2017-01-26 ENCOUNTER — Other Ambulatory Visit: Payer: Self-pay | Admitting: Family Medicine

## 2017-01-26 NOTE — Telephone Encounter (Signed)
Dear [name] Team Can you please call inher Tramadol as below [facility]! [name]

## 2017-01-27 NOTE — Telephone Encounter (Signed)
Called in to [facility] on [address]. LVM. Ottis Stain, CMA

## 2017-02-02 ENCOUNTER — Ambulatory Visit: Payer: Medicare HMO

## 2017-02-05 ENCOUNTER — Other Ambulatory Visit: Payer: Self-pay

## 2017-02-05 NOTE — Patient Outreach (Signed)
[name]) Care Management   02/05/2017  DAJANA GEHRIG [birth year] [id number]  [name] is an 71 y.o. female  Subjective: client states she has blood pressure monitor.  Objective:  BP 132/78   Pulse 67   Resp 20   SpO2 93%   Review of Systems  Respiratory:       Lungs sounds clear, decreased lower lobes.  Cardiovascular:       S1S2 noted, regular. Ankle/feet 1+edema.    Physical Exam  Encounter Medications:   Outpatient Encounter Prescriptions as of 02/05/2017  Medication Sig Note  . amoxicillin (AMOXIL) 500 MG capsule Take 500 mg by mouth 3 (three) times daily. Takes prophylactic before dentist appointments.   Marland Kitchen aspirin 81 MG tablet Take 1 tablet (81 mg total) by mouth daily.   Marland Kitchen atorvastatin (LIPITOR) 20 MG tablet Take 1 tablet (20 mg total) by mouth daily.   . bisacodyl (DULCOLAX) 5 MG EC tablet Take 5 mg by mouth daily as needed for moderate constipation.   . Blood Glucose Monitoring Suppl (ACCU-CHEK AVIVA PLUS) w/Device KIT Use to check blood sugar daily. ICD-10 code: E11.9   . carbidopa-levodopa (SINEMET IR) 25-100 MG tablet Take 1-2 tablets by mouth 3 (three) times daily.   . fluticasone (FLONASE) 50 MCG/ACT nasal spray SHAKE LIQUID AND USE 2 SPRAYS IN EACH NOSTRIL DAILY (Patient not taking: Reported on 01/14/2017)   . furosemide (LASIX) 40 MG tablet Take 1 tablet by mouth daily.   Marland Kitchen gabapentin (NEURONTIN) 300 MG capsule Patient reported taking 2 capsules in the morning and 1 capsule in the evening. 01/06/2017: Does not take daily, but takes States takes as needed.   . loperamide (IMODIUM) 2 MG capsule Take 2 mg by mouth as needed for diarrhea or loose stools.   Marland Kitchen olmesartan (BENICAR) 5 MG tablet Take 2 tablets (10 mg total) by mouth daily.   Marland Kitchen omeprazole (PRILOSEC) 40 MG capsule TAKE 1 CAPSULE BY MOUTH EVERY DAY   . ONE TOUCH ULTRA TEST test strip USE AS DIRECTED   . ONETOUCH DELICA LANCETS 56L MISC Daily for Glucose Testing   .  propranolol ER (INDERAL LA) 80 MG 24 hr capsule TAKE 1 CAPSULE BY MOUTH DAILY   . sertraline (ZOLOFT) 100 MG tablet Take 100 mg by mouth daily.    . temazepam (RESTORIL) 15 MG capsule Take 1 capsule by mouth at bedtime. Per psychiatry   . traMADol (ULTRAM) 50 MG tablet TAKE 1 TO 2 TABLETS BY MOUTH TWICE DAILY AS NEEDED FOR PAIN    Facility-Administered Encounter Medications as of 02/05/2017  Medication  . cyanocobalamin ((VITAMIN B-12)) injection 1,000 mcg    Functional Status:   In your present state of health, do you have any difficulty performing the following activities: 01/06/2017 08/20/2016  Hearing? N N  Vision? N Y  Difficulty concentrating or making decisions? N Y  Walking or climbing stairs? Y Y  Dressing or bathing? Y Y  Doing errands, shopping? Tempie Donning  Preparing Food and eating ? Y Y  Using the Toilet? N N  In the past six months, have you accidently leaked urine? Y Y  Do you have problems with loss of bowel control? Y Y  Managing your Medications? N N  Comment - friend helps with her medication, Shonna Chock  Managing your Finances? N Y  Housekeeping or managing your Housekeeping? N Y  Some recent data might be hidden    Fall/Depression Screening:    Fall Risk  01/14/2017 01/06/2017 12/17/2016  Falls in the past year? No Yes Yes  Comment states she stumbled but didn't fall - -  Number falls in past yr: - 1 1  Comment - - -  Injury with Fall? - No No  Comment - - -  Risk Factor Category  - - -  Risk for fall due to : - Impaired balance/gait;History of fall(s) -  Follow up - Falls prevention discussed -   PHQ 2/9 Scores 01/06/2017 12/17/2016 11/26/2016 11/14/2016 09/23/2016 08/20/2016 08/12/2016  PHQ - 2 Score 0 0 0 0 0 0 0  PHQ- 9 Score - - - - - - -  Exception Documentation - - - - - - -  Not completed - - - - - - -    Assessment:  RNCM received referral for follow up on 12/25/16.  71 year old with history of HTN, Parkinson's, UTI, depression, osteoarhtritis  RNCM completed  home visit. Present during visit was husband and her daughter.   History of HTN. Client request education regarding hypertension. HTN education completed. Client verbalized understanding and was able to discuss hypertension management strategies.   Discussed history of depression. Client reports she see psychiatrist who has given her a referral for counselor and also her primary care has given her a referral for counselor to seek as needed.   History of falls, client denies any falls since last home visit.  She denies any resource needs or concerns. RNCM discussed case closure. Client is agreeable. RNCM encouraged client to call RNCM as needed. Also encouraged to call the 24 hour nurse advice line as needed.   Plan: close case.  [facility] CM Care Plan Problem One     Most Recent Value  Care Plan Problem One  patient has had frequent falls  Role Documenting the Problem One  Care Management [place] for Problem One  Not Active    [facility] CM Care Plan Problem Two     Most Recent Value  Care Plan Problem Two  Patient is suppose to take her blood glucose once a week, has not been monitoring and recording  Role Documenting the Problem Two  Care Management Coordinator  Care Plan for Problem Two  Not Active  [facility] CM Short Term Goal #1   Patient will monitor and record glucose readings 3 out of 4  weeks   [facility] CM Short Term Goal #1 Start Date  08/20/16    [facility] CM Care Plan Problem Three     Most Recent Value  Care Plan Problem Three  knowledge deficit regarding HTN  Role Documenting the Problem Three  Care Management Coordinator  Care Plan for Problem Three  Active  [facility] Long Term Goal   client will verbalize at least three strategies for managing HTN within the next 31-60 days.  [facility] Long Term Goal Start Date  01/06/17  [facility] Long Term Goal Met Date  02/05/17  [facility] CM Short Term Goal #1   client will review EMMI article and prepare questions by next visit  [facility] CM Short Term Goal #1 Start Date   01/06/17  [facility] CM Short Term Goal #1 Met Date  02/05/17  [facility] CM Short Term Goal #2   client will obtain automatic cuff, take and record readingss by next visit  [facility] CM Short Term Goal #2 Start Date  01/06/17  [facility] CM Short Term Goal #2 Met Date  02/05/17  Interventions for Short Term Goal #2  RNCM reviewed how  to take blood pressure, encouraged feet flat on the floor, not talking or moving during the recording.      Thea Silversmith, RN, MSN, [place] Coordinator Cell: [phone]

## 2017-02-18 ENCOUNTER — Ambulatory Visit: Payer: Medicare HMO | Admitting: Family Medicine

## 2017-02-18 ENCOUNTER — Other Ambulatory Visit: Payer: Self-pay

## 2017-02-18 ENCOUNTER — Encounter: Payer: Self-pay | Admitting: Family Medicine

## 2017-02-18 VITALS — BP 144/86 | HR 79 | Temp 98.3°F | Ht 64.0 in | Wt 186.6 lb

## 2017-02-18 DIAGNOSIS — E119 Type 2 diabetes mellitus without complications: Secondary | ICD-10-CM | POA: Diagnosis not present

## 2017-02-18 DIAGNOSIS — F339 Major depressive disorder, recurrent, unspecified: Secondary | ICD-10-CM

## 2017-02-18 DIAGNOSIS — G20 Parkinson's disease: Secondary | ICD-10-CM | POA: Diagnosis not present

## 2017-02-18 DIAGNOSIS — M25562 Pain in left knee: Secondary | ICD-10-CM | POA: Diagnosis not present

## 2017-02-18 DIAGNOSIS — E538 Deficiency of other specified B group vitamins: Secondary | ICD-10-CM | POA: Diagnosis not present

## 2017-02-18 DIAGNOSIS — M159 Polyosteoarthritis, unspecified: Secondary | ICD-10-CM

## 2017-02-18 DIAGNOSIS — M150 Primary generalized (osteo)arthritis: Secondary | ICD-10-CM | POA: Diagnosis not present

## 2017-02-18 DIAGNOSIS — M8949 Other hypertrophic osteoarthropathy, multiple sites: Secondary | ICD-10-CM

## 2017-02-18 LAB — GLUCOSE, POCT (MANUAL RESULT ENTRY): POC Glucose: 97 mg/dl (ref 70–99)

## 2017-02-18 LAB — POCT GLYCOSYLATED HEMOGLOBIN (HGB A1C): Hemoglobin A1C: 5.7

## 2017-02-18 MED ORDER — CYANOCOBALAMIN 1000 MCG/ML IJ SOLN
1000.0000 ug | Freq: Once | INTRAMUSCULAR | Status: AC
  Administered 2017-02-18: 1000 ug via INTRAMUSCULAR

## 2017-02-18 MED ORDER — METHYLPREDNISOLONE ACETATE 40 MG/ML IJ SUSP
40.0000 mg | Freq: Once | INTRAMUSCULAR | Status: AC
  Administered 2017-02-18: 40 mg via INTRAMUSCULAR

## 2017-02-18 NOTE — Progress Notes (Signed)
Oct

## 2017-02-20 NOTE — Progress Notes (Signed)
    CHIEF COMPLAINT / HPI:  #1. Left knee pain. She wants an injection. Pain is centrally located in her knee, worse with walking or standing. She's had some small amount of swelling intermittently. No locking or giving way. #2. Depression: She has decided to quit going to her psychiatrist and has taken herself off of her SSRI. Says she feels much better since she's been off it. She's been off it by her report about a month. #3. Parkinson's disease: Continues to be followed by neurology. They adjusted her medications recently and she feels like she has much more fluidity of movement. #4. Diabetes mellitus: No episodes of low blood sugar. #5. Social stressors: Continues to intermittently have marital discord.  REVIEW OF SYSTEMS: No unusual weight change. No chest pain. No change in her exercise tolerance. She history of present illness.  PERTINENT  PMH / PSH: I have reviewed the patient's medications, allergies, past medical and surgical history, smoking status and updated in the EMR as appropriate.   OBJECTIVE:  Vital signs reviewed. GENERAL: Well-developed, well-nourished, no acute distress. CARDIOVASCULAR: Regular rate and rhythm no murmur gallop or rub LUNGS: Clear to auscultation bilaterally, no rales or wheeze. ABDOMEN: Soft positive bowel sounds NEURO: Her movement see much more fluid today. She still has resting tremor of hands and oral buccal area. She is able to get on and off exam table with no assistance.. MSK: Movement of extremity x 4. KNEE: Left. Joint line tenderness medially greater than laterally. Full range of motion in flexion extension. Ligamentously intact to varus and valgus stress. Calf is soft. Popliteal space is benign. VASCULAR: Dorsalis pedis pulses 2+ bilaterally symmetrical.  PROCEDURE: INJECTION: Patient was given informed consent, signed copy in the chart. Appropriate time out was taken. Area prepped and draped in usual sterile fashion. Ethyl chloride was   used for local anesthesia. A 21 gauge 1 1/2 inch needle was used.. 1 cc of methylprednisolone 40 mg/ml plus  1 cc of 1% lidocaine without epinephrine was injected into the left knee using a(n) anterior medial approach.   The patient tolerated the procedure well. There were no complications. Post procedure instructions were given.     ASSESSMENT / PLAN: Please see problem oriented charting for details

## 2017-02-20 NOTE — Assessment & Plan Note (Signed)
A1c looks great

## 2017-02-20 NOTE — Assessment & Plan Note (Signed)
She seems to be adamant about stopping her depression medicine and in fact has been off in about a month. I'm not sure what's behind this but she is not open to any further discussion about it at this time. She seems quite happy with her decision on the way she's feeling right now.

## 2017-02-20 NOTE — Assessment & Plan Note (Signed)
Left knee CSI today.

## 2017-02-20 NOTE — Assessment & Plan Note (Signed)
She seems to have much more fluid if movement today. Continues follow-up with neurology.

## 2017-03-16 ENCOUNTER — Encounter: Payer: Self-pay | Admitting: Diagnostic Neuroimaging

## 2017-03-16 DIAGNOSIS — H52223 Regular astigmatism, bilateral: Secondary | ICD-10-CM | POA: Diagnosis not present

## 2017-03-16 DIAGNOSIS — H524 Presbyopia: Secondary | ICD-10-CM | POA: Diagnosis not present

## 2017-03-16 DIAGNOSIS — E119 Type 2 diabetes mellitus without complications: Secondary | ICD-10-CM | POA: Diagnosis not present

## 2017-03-16 DIAGNOSIS — H5212 Myopia, left eye: Secondary | ICD-10-CM | POA: Diagnosis not present

## 2017-03-16 DIAGNOSIS — Z7984 Long term (current) use of oral hypoglycemic drugs: Secondary | ICD-10-CM | POA: Diagnosis not present

## 2017-03-16 DIAGNOSIS — Z961 Presence of intraocular lens: Secondary | ICD-10-CM | POA: Diagnosis not present

## 2017-03-16 DIAGNOSIS — H353131 Nonexudative age-related macular degeneration, bilateral, early dry stage: Secondary | ICD-10-CM | POA: Diagnosis not present

## 2017-03-16 DIAGNOSIS — Z0100 Encounter for examination of eyes and vision without abnormal findings: Secondary | ICD-10-CM | POA: Diagnosis not present

## 2017-03-16 LAB — HM DIABETES EYE EXAM

## 2017-04-20 DIAGNOSIS — F3341 Major depressive disorder, recurrent, in partial remission: Secondary | ICD-10-CM | POA: Diagnosis not present

## 2017-04-29 ENCOUNTER — Ambulatory Visit: Payer: Self-pay | Admitting: Family Medicine

## 2017-05-14 ENCOUNTER — Encounter: Payer: Self-pay | Admitting: Family Medicine

## 2017-07-03 ENCOUNTER — Other Ambulatory Visit: Payer: Self-pay | Admitting: Diagnostic Neuroimaging

## 2017-07-03 ENCOUNTER — Telehealth: Payer: Self-pay | Admitting: Diagnostic Neuroimaging

## 2017-07-03 MED ORDER — CARBIDOPA-LEVODOPA 25-100 MG PO TABS: 1.0000 | ORAL_TABLET | Freq: Three times a day (TID) | ORAL | 0 refills | Status: DC

## 2017-07-03 NOTE — Telephone Encounter (Signed)
[facility] called requesting a 12 day prescription for carbidopa-levodopa (SINEMET IR) 25-100 MG tablet. Stating the pt is out of town and doesn't have medication with her. [facility] in [place]

## 2017-07-03 NOTE — Telephone Encounter (Signed)
Done for 15 days. (90 tabs).

## 2017-07-15 ENCOUNTER — Encounter: Payer: Self-pay | Admitting: Diagnostic Neuroimaging

## 2017-07-15 ENCOUNTER — Telehealth: Payer: Self-pay | Admitting: *Deleted

## 2017-07-15 ENCOUNTER — Ambulatory Visit: Payer: Medicare HMO | Admitting: Diagnostic Neuroimaging

## 2017-07-15 NOTE — Telephone Encounter (Signed)
Husband called today to cancel patient's follow up this afternoon. He stated she is sick.

## 2017-07-22 ENCOUNTER — Ambulatory Visit: Payer: Medicare HMO | Admitting: Family Medicine

## 2017-07-27 ENCOUNTER — Encounter: Payer: Self-pay | Admitting: Diagnostic Neuroimaging

## 2017-07-27 ENCOUNTER — Ambulatory Visit: Payer: Medicare HMO | Admitting: Diagnostic Neuroimaging

## 2017-07-27 VITALS — BP 134/85 | HR 92 | Ht 64.0 in | Wt 173.8 lb

## 2017-07-27 DIAGNOSIS — R197 Diarrhea, unspecified: Secondary | ICD-10-CM | POA: Diagnosis not present

## 2017-07-27 DIAGNOSIS — G249 Dystonia, unspecified: Secondary | ICD-10-CM

## 2017-07-27 DIAGNOSIS — K920 Hematemesis: Secondary | ICD-10-CM

## 2017-07-27 DIAGNOSIS — G20 Parkinson's disease: Secondary | ICD-10-CM | POA: Diagnosis not present

## 2017-07-27 DIAGNOSIS — R269 Unspecified abnormalities of gait and mobility: Secondary | ICD-10-CM | POA: Diagnosis not present

## 2017-07-27 DIAGNOSIS — G20A1 Parkinson's disease without dyskinesia, without mention of fluctuations: Secondary | ICD-10-CM

## 2017-07-27 MED ORDER — CARBIDOPA-LEVODOPA 25-100 MG PO TABS: 1.0000 | ORAL_TABLET | Freq: Every day | ORAL | 12 refills | Status: DC

## 2017-07-27 NOTE — Progress Notes (Signed)
[facility]  PATIENT: [name] DOB: Jun 28, 1945  REFERRING CLINICIAN:  HISTORY FROM: patient and husband REASON FOR VISIT: follow up   HISTORICAL  CHIEF COMPLAINT:  Chief Complaint  Patient presents with  . Parkinson's disease    rm 7, husband- [name], dgtrSharyn Lull, "stopped many meds; carbi/levo causing diarrhea and upset stomach"  . Follow-up    6 month    HISTORY OF PRESENT ILLNESS:   UPDATE (07/27/17, VRP): Since last visit, doing worse with excessive movements, anxiety, falls. Tolerating carb/levo (2 tabs 8am, 2 tabs 6pm, 1 tab at 11pm). Now having more nausea, coffee ground emesis, and occ black stools. Some excessive movements, but not correlated with meds.  UPDATE (01/14/17, VRP): Since last visit, doing fair. More anger and rage issues, esp towards husband. Some nausea with carb/levo. Now on carb/levio 1.5 tabs TID, and better tremor control. No falls.  UPDATE 09/09/16 (VRP): Since last visit, no more falls. Now off any benzos or sleep aids. Using walker at times, but mainly walking stick. [facility] care mgmt has reached out to help patient. Now on carb/levo 1 / 1 / 0.5. Continues with nausea.   UPDATE 05/05/16 (MM): "She returns today after experiencing increased falls in the last 2 months. She states that some of the falls have been severe and she has fallen on her face. Fortunately she has not suffered any significant injuries. She does state that her left shoulder and neck is slightly tight after one of the falls. She is not using an assistive device. She states that she did purchase a walker last week. Her husband states that she tends to drag her feet when ambulating. Reports that she had physical therapy over a year ago. She is currently taking Sinemet 25-100 milligrams one and a half tablets 3 times a day. She takes her first dose at 8:30, second dose at 3 PM and last dose at 9:30 PM. She states that she can tell a difference if she misses a  dose. Denies any changes in her sleep. She states that if she does not chew her food up well she will become choked. Denies getting choked on liquids. Reports that she does have a tremor in the hands and mouth. She does note that she shuffles her feet when ambulating. She returns today for an evaluation."  UPDATE 03/12/16: Since last visit, continues to have falls; ~ 5 x in last 6 months. Not using cane or walker. Had episode of believing that she went to a party in [place], [place] (which she did not) and then told her family about it. No further delusions or hallucinations. Has been on lower carb/levo (1 tab twice a day) lately due to nausea.  UPDATE 09/03/15: Since last visit, stable. Did well with PT. Doing well with lower dose of carb/levo.   UPDATE 02/13/15: Since last visit, continues to have tremor, tongue and mouth movments (better), sweating, anxiety, inverted sleep schedule.   UPDATE 10/09/14: Since last visit, had increased carb/levo up to 2.5 tabs TID (even though I had recommended going up to 2 tabs TID; she's not sure why she did this). More tongue and mouth movements. More falls. Went to hospital for UTI, AKI, N/V/diarrhea In March 2016.   UPDATE 04/04/14: Since last visit, she reports that she is stable from PD standpoint. Tremor and gait are stable. Tolerating carb/levo 1.5 tabs TID. Hasn't tried going up to 2 tabs TID. Re: swallowing issues, she reports remote esophagus surgery for swallow  issues 15 yrs ago. Feels that this is a problem again, and wants to see GI. Re: rash, it turns out that they had bedbugs in their home; now problem is resolved. Struggling with diarrhea and excess sweating.  UPDATE 12/26/13: Since last visit, was hospitalized (2 weeks ago) for delirium, hallucinations, chest pain, UTI, and medication overuse. Was taking ambien and a friend's xanax to help her sleep (was struggling with anxiety and insomnia; not seen Dr. Casimiro Needle in a while). Now back at home and doing  better. Tremor and gait are worsening. Some wearing off (early AM and late evening). Also with 2 weeks are night time itching and rash. Going to see dermatology soon.   UPDATE 01/18/13: Since last visit, now on carb/levo 1.5 tabs TID. Nausea is improved since she started taking her metformin at different time than carb/levo. Tremor, balance, coordination are stable.  UPDATE 06/01/12: Since last visit, tried carb/levo 1.5 tabs TID x 1 week, then stopped. Felt like it was too much medicine. Did not have increased side effects. Has been struggling with nausea throughout. Tremor is persistent.  UPDATE 01/28/12: Doing well. No wearing off. Tolerating meds. Stopped azilect (per PCP for nausea). Now on fluoxetine for depression.  UPDATE 08/27/10: Doing better on carb/levo.  Taking 1 tab TID (6am, 4pm, 9pm).  Minimal nausea.  Wakes up with more tremor in AM, then reduction in tremor 44mn after dose.  Effect wears off around 4pm.  Daughter reports some intermittent confusion.  PRIOR HPI (06/26/10): 72yo Caucasian female [place] with concern for possible Parkinson's disease. Ms. ENavisnotes she first noticed a resting tremor in her left hand about 3 years ago, and this has gotten progressively worse since then. It has also spread to now involve her mouth and right hand as well, though she notes it is still worst in her left hand. Stress and anxiety can accentuate the tremors, whereas active use can reduce them. She also notes the tremors being worse in the morning. She denies noting anything else that seems to make the tremors better or worse. She admits to what seems like possible bradykinesia, in her words that she has "a hard time getting going sometimes," but denies any freezing. She does note some new difficulty with writing, but denies micrographia. She also admits to feeling like her balance and walking is "a bit off," but she relates this more to the osteoarthritis in her knees and having  had surgery on her R knee. She denies orthostatic symptoms, hallucinations or delusions, difficulty standing or sitting, weakness, new visual changes (aside from her macular degeneration), or feelings of rigidity. She also denies bizarre dreams, nightmares, RLS symptoms, or REM behavior disorder symptoms. She admits to having two uncles who have tremors, etiology unclear, but adds that one uncle had alcoholism (and it was believed this was the cause). She is concerned about what is causing her tremors, and admits that although she initially "put off" being evaluated further, she is anxious to know what might be the cause of her tremors.   REVIEW OF SYSTEMS: Full 14 system review of systems performed and negative except for: only as per HPI.    ALLERGIES: Allergies  Allergen Reactions  . Ace Inhibitors Anaphylaxis and Swelling  . Azilect [Rasagiline Mesylate] Other (See Comments)    hypotension  . Lisinopril Swelling    Severe facial angioedema requiring hospitalization 2007 (approx)    HOME MEDICATIONS: Outpatient Medications Prior to Visit  Medication Sig Dispense  Refill  . Blood Glucose Monitoring Suppl (ACCU-CHEK AVIVA PLUS) w/Device KIT Use to check blood sugar daily. ICD-10 code: E11.9 1 kit 0  . carbidopa-levodopa (SINEMET IR) 25-100 MG tablet Take 1-2 tablets by mouth 3 (three) times daily. 90 tablet 0  . gabapentin (NEURONTIN) 300 MG capsule Patient reported taking 2 capsules in the morning and 1 capsule in the evening.    . loperamide (IMODIUM) 2 MG capsule Take 2 mg by mouth as needed for diarrhea or loose stools.    Marland Kitchen omeprazole (PRILOSEC) 40 MG capsule TAKE 1 CAPSULE BY MOUTH EVERY DAY 90 capsule 3  . ONE TOUCH ULTRA TEST test strip USE AS DIRECTED 100 each 12  . ONETOUCH DELICA LANCETS 77O MISC Daily for Glucose Testing 300 each 1  . temazepam (RESTORIL) 15 MG capsule Take 1 capsule by mouth at bedtime. Per psychiatry  1  . traMADol (ULTRAM) 50 MG tablet TAKE 1 TO 2 TABLETS BY  MOUTH TWICE DAILY AS NEEDED FOR PAIN 120 tablet 5  . aspirin 81 MG tablet Take 1 tablet (81 mg total) by mouth daily. (Patient not taking: Reported on 07/27/2017) 90 tablet 3  . atorvastatin (LIPITOR) 20 MG tablet Take 1 tablet (20 mg total) by mouth daily. (Patient not taking: Reported on 07/27/2017) 90 tablet 3  . bisacodyl (DULCOLAX) 5 MG EC tablet Take 5 mg by mouth daily as needed for moderate constipation.    . fluticasone (FLONASE) 50 MCG/ACT nasal spray SHAKE LIQUID AND USE 2 SPRAYS IN EACH NOSTRIL DAILY (Patient not taking: Reported on 01/14/2017) 16 g 12  . furosemide (LASIX) 40 MG tablet Take 1 tablet by mouth daily.  3  . olmesartan (BENICAR) 5 MG tablet Take 2 tablets (10 mg total) by mouth daily. (Patient not taking: Reported on 07/27/2017) 180 tablet 3  . propranolol ER (INDERAL LA) 80 MG 24 hr capsule TAKE 1 CAPSULE BY MOUTH DAILY (Patient not taking: Reported on 07/27/2017) 90 capsule 3  . sertraline (ZOLOFT) 100 MG tablet Take 100 mg by mouth daily.      Facility-Administered Medications Prior to Visit  Medication Dose Route Frequency Provider Last Rate Last Dose  . cyanocobalamin ((VITAMIN B-12)) injection 1,000 mcg  1,000 mcg Intramuscular Once Mariel Aloe, MD        PAST MEDICAL HISTORY: Past Medical History:  Diagnosis Date  . Allergy   . Anxiety   . Arthritis    "knees, back" (06/29/2014)  . Chronic bronchitis ([place])    "get it q yr"  . Chronic mid back pain   . Cognitive impairment 12/11/2013  . Complication of anesthesia 07/2008   "hard to get me woke up when I had my knee replaced; they said they had to bring me back"  . Esophageal stricture   . GERD (gastroesophageal reflux disease)   . History of gout 1970's  . Hyperlipidemia   . Hypertension   . Movement disorder   . Osteoarthritis   . Parkinson's disease ([place])   . Scoliosis   . Situational depression   . Type II diabetes mellitus ([place])   . UTI (lower urinary tract infection)     PAST SURGICAL  HISTORY: Past Surgical History:  Procedure Laterality Date  . ABDOMINAL HYSTERECTOMY    . CHOLECYSTECTOMY OPEN    . COLON SURGERY    . DILATION AND CURETTAGE OF UTERUS    . ESOPHAGOGASTRIC FUNDOPLASTY     some type "esoph surgery" per pt  . JOINT REPLACEMENT    .  TOTAL KNEE ARTHROPLASTY Right 07/2008  . TUBAL LIGATION      FAMILY HISTORY: Family History  Problem Relation Age of Onset  . Heart disease Mother   . Diabetes Mother   . Heart disease Father   . Heart attack Sister   . Heart attack Brother   . Cerebral palsy Son     SOCIAL HISTORY:  Social History   Socioeconomic History  . Marital status: Married    Spouse name: [name]  . Number of children: 2  . Years of education: 6  . Highest education level: Not on file  Occupational History  . Occupation: Retired    Fish farm manager: NOT EMPLOYED  Social Needs  . Financial resource strain: Not on file  . Food insecurity:    Worry: Not on file    Inability: Not on file  . Transportation needs:    Medical: Not on file    Non-medical: Not on file  Tobacco Use  . Smoking status: Never Smoker  . Smokeless tobacco: Never Used  Substance and Sexual Activity  . Alcohol use: No  . Drug use: No  . Sexual activity: Not on file  Lifestyle  . Physical activity:    Days per week: Not on file    Minutes per session: Not on file  . Stress: Not on file  Relationships  . Social connections:    Talks on phone: Not on file    Gets together: Not on file    Attends religious service: Not on file    Active member of club or organization: Not on file    Attends meetings of clubs or organizations: Not on file    Relationship status: Not on file  . Intimate partner violence:    Fear of current or ex partner: Not on file    Emotionally abused: Not on file    Physically abused: Not on file    Forced sexual activity: Not on file  Other Topics Concern  . Not on file  Social History Narrative   Health Care POA:    Emergency  Contact: daughter, [name], [phone] husband, [name] [phone]   End of Life Plan:   Who lives with you: Lives with husband and son, [name]   Any pets: Rabbit, [name]   Diet: Patient has a varied diet but struggles with what to eat with hypertension, diabetes, parkinsons   Exercise: Patient does not have any regular exercise routine.   Seatbelts: Patient reports wearing her seatbelt when in vehicle.   Nancy Fetter Exposure/Protection: Patient reports wearing sun block lotion daily.   Hobbies: Watching game shows, writing poetry, writing in journal, volunteering at day program with son.    Caffeine Use: very little occasional          PHYSICAL EXAM  Vitals:   07/27/17 1455  BP: 134/85  Pulse: 92  Weight: 173 lb 12.8 oz (78.8 kg)  Height: 5' 4"  (1.626 m)    Not recorded      Body mass index is 29.83 kg/m.  GENERAL EXAM: General: Patient is awake, alert and in no acute distress.  Well developed and groomed. Neck: Neck is supple. Cardiovascular: No carotid artery bruits.  Heart is regular rate and rhythm with no murmurs.  Neurologic Exam  Mental Status: Awake, alert. Language is fluent and comprehension intact. Cranial Nerves: Pupils are equal and reactive to light.  Visual fields are full to confrontation.  Conjugate eye movements are full and symmetric.  Facial sensation and strength are symmetric.  Hearing is intact.  Palate elevated symmetrically and uvula is midline.  Shoulder shrug is symmetric.  Tongue is midline. NO OROLINGUAL DYSKINESIAS. Motor: RESTLESS; ORO-LINGUAL DYSKINESIAS; MILD RESTING TREMOR; MILD POSTURAL TREMOR IN BUE; BRADYKINESIA AND RIGIDITY IN LUE > RUE, LLE > RLE. Normal bulk. Full strength in the upper and lower extremities.  No pronator drift. Sensory: Intact and symmetric to light touch. Coordination: No ataxia or dysmetria on finger-nose or rapid alternating movement testing. Reflexes: BUE and BLE present and symmetric Gait and Station: Narrow based  gait. SMOOTH GAIT, SLIGHT DECR ARM SWING.   DIAGNOSTIC DATA (LABS, IMAGING, TESTING) - I reviewed patient records, labs, notes, testing and imaging myself where available.  Lab Results  Component Value Date   WBC 8.6 11/26/2016   HGB 13.8 11/26/2016   HCT 41.6 11/26/2016   MCV 93 11/26/2016   PLT 290 11/26/2016      Component Value Date/Time   NA 143 11/26/2016 1043   K 4.3 11/26/2016 1043   CL 102 11/26/2016 1043   CO2 21 11/26/2016 1043   GLUCOSE 122 (H) 11/26/2016 1043   GLUCOSE 96 02/27/2016 1029   BUN 22 11/26/2016 1043   CREATININE 1.17 (H) 11/26/2016 1043   CREATININE 1.07 (H) 02/27/2016 1029   CALCIUM 9.8 11/26/2016 1043   PROT 7.1 11/26/2016 1043   ALBUMIN 4.5 11/26/2016 1043   AST 28 11/26/2016 1043   ALT 9 11/26/2016 1043   ALKPHOS 107 11/26/2016 1043   BILITOT 0.4 11/26/2016 1043   GFRNONAA 47 (L) 11/26/2016 1043   GFRNONAA 53 (L) 02/27/2016 1029   GFRAA 54 (L) 11/26/2016 1043   GFRAA 61 02/27/2016 1029   Lab Results  Component Value Date   CHOL 244 (H) 06/07/2014   HDL 38.60 (L) 06/07/2014   LDLCALC 123 (H) 12/11/2013   LDLDIRECT 179 (H) 02/27/2016   TRIG 384.0 (H) 06/07/2014   CHOLHDL 6 06/07/2014   Lab Results  Component Value Date   HGBA1C 5.7 02/18/2017   Lab Results  Component Value Date   VITAMINB12 301 01/05/2008   Lab Results  Component Value Date   TSH 2.260 11/26/2016    07/05/10 MRI BRAIN - minimal scattered chronic small vessel ischemic disease   ASSESSMENT AND PLAN  72 y.o. female with resting tremor of BUE, cogwheel rigidity, bradykinesia, decreased arm swing, anxiety, diff swallowing, memory loss. Some progression of symptoms and increased falls.  Dx:  Parkinson disease ([place])  Abnormality of gait  Dyskinesia  Coffee ground emesis  Diarrhea, unspecified type    PLAN:  PARKINSON'S DISEASE (established problem, worsening) - continue carb/levo (25/100) --> 1 tab 4-5 times per day - caution with peak  dyskinesia - recommend more supervision at home with medications and ADLs - use rollator walker at all times  GI EMEMSIS (BLACK) AND DIARRHEA (BLACK) - follow up with Dr. Nori Riis and GI consult - check CBC, CMP today  MOOD DISORDER / LABILITY  follow up anger issues with Dr. Casimiro Needle - caution with excessive alcohol use (no more that 1 drink per day)  Orders Placed This Encounter  Procedures  . CBC with Differential/Platelet  . Comprehensive metabolic panel   Meds ordered this encounter  Medications  . carbidopa-levodopa (SINEMET IR) 25-100 MG tablet    Sig: Take 1 tablet by mouth 5 (five) times daily.    Dispense:  150 tablet    Refill:  12   Return in about 6 months (around 01/26/2018).    Penni Bombard, MD 07/27/2017, 3:38  PM Certified in Neurology, Neurophysiology and [facility] [address] [place], [place] [address] [phone]

## 2017-07-27 NOTE — Patient Instructions (Signed)
PARKINSON'S DISEASE - continue carb/levo (25/100) --> 1 tab 4-5 times per day (for example: 8am, 11am, 2pm, 5pm, 8pm) - recommend more supervision at home with medications and ADLs - use rollator walker at all times  GI VOMITING (BLACK) AND DIARRHEA (BLACK) - follow up with Dr. Nori Riis and GI consult - check CBC, CMP today  MOOD DISORDER / LABILITY  follow up with Dr. Casimiro Needle

## 2017-07-28 ENCOUNTER — Encounter: Payer: Self-pay | Admitting: Student

## 2017-07-28 ENCOUNTER — Ambulatory Visit (INDEPENDENT_AMBULATORY_CARE_PROVIDER_SITE_OTHER): Payer: Medicare HMO | Admitting: Student

## 2017-07-28 ENCOUNTER — Other Ambulatory Visit: Payer: Self-pay

## 2017-07-28 ENCOUNTER — Other Ambulatory Visit: Payer: Self-pay | Admitting: Family Medicine

## 2017-07-28 ENCOUNTER — Telehealth: Payer: Self-pay | Admitting: *Deleted

## 2017-07-28 VITALS — BP 148/76 | HR 76 | Temp 98.4°F | Ht 64.0 in | Wt 172.6 lb

## 2017-07-28 DIAGNOSIS — M1712 Unilateral primary osteoarthritis, left knee: Secondary | ICD-10-CM

## 2017-07-28 DIAGNOSIS — E119 Type 2 diabetes mellitus without complications: Secondary | ICD-10-CM

## 2017-07-28 LAB — CBC WITH DIFFERENTIAL/PLATELET
Basophils Absolute: 0 10*3/uL (ref 0.0–0.2)
Basos: 0 %
EOS (ABSOLUTE): 0.1 10*3/uL (ref 0.0–0.4)
Eos: 2 %
Hematocrit: 38 % (ref 34.0–46.6)
Hemoglobin: 12.9 g/dL (ref 11.1–15.9)
Immature Grans (Abs): 0 10*3/uL (ref 0.0–0.1)
Immature Granulocytes: 0 %
Lymphocytes Absolute: 2 10*3/uL (ref 0.7–3.1)
Lymphs: 28 %
MCH: 31.2 pg (ref 26.6–33.0)
MCHC: 33.9 g/dL (ref 31.5–35.7)
MCV: 92 fL (ref 79–97)
Monocytes Absolute: 0.6 10*3/uL (ref 0.1–0.9)
Monocytes: 8 %
Neutrophils Absolute: 4.5 10*3/uL (ref 1.4–7.0)
Neutrophils: 62 %
Platelets: 307 10*3/uL (ref 150–379)
RBC: 4.14 x10E6/uL (ref 3.77–5.28)
RDW: 13.9 % (ref 12.3–15.4)
WBC: 7.3 10*3/uL (ref 3.4–10.8)

## 2017-07-28 LAB — COMPREHENSIVE METABOLIC PANEL
ALT: 6 IU/L (ref 0–32)
AST: 16 IU/L (ref 0–40)
Albumin/Globulin Ratio: 1.6 (ref 1.2–2.2)
Albumin: 4.1 g/dL (ref 3.5–4.8)
Alkaline Phosphatase: 86 IU/L (ref 39–117)
BUN/Creatinine Ratio: 9 — ABNORMAL LOW (ref 12–28)
BUN: 11 mg/dL (ref 8–27)
Bilirubin Total: 0.3 mg/dL (ref 0.0–1.2)
CO2: 21 mmol/L (ref 20–29)
Calcium: 9.1 mg/dL (ref 8.7–10.3)
Chloride: 106 mmol/L (ref 96–106)
Creatinine, Ser: 1.16 mg/dL — ABNORMAL HIGH (ref 0.57–1.00)
GFR calc Af Amer: 54 mL/min/{1.73_m2} — ABNORMAL LOW (ref >59)
GFR calc non Af Amer: 47 mL/min/{1.73_m2} — ABNORMAL LOW (ref >59)
Globulin, Total: 2.5 g/dL (ref 1.5–4.5)
Glucose: 112 mg/dL — ABNORMAL HIGH (ref 65–99)
Potassium: 4 mmol/L (ref 3.5–5.2)
Sodium: 144 mmol/L (ref 134–144)
Total Protein: 6.6 g/dL (ref 6.0–8.5)

## 2017-07-28 LAB — POCT GLYCOSYLATED HEMOGLOBIN (HGB A1C): Hemoglobin A1C: 5.6

## 2017-07-28 MED ORDER — METHYLPREDNISOLONE ACETATE 40 MG/ML IJ SUSP
40.0000 mg | Freq: Once | INTRAMUSCULAR | Status: AC
  Administered 2017-07-28: 40 mg via INTRAMUSCULAR

## 2017-07-28 NOTE — Progress Notes (Signed)
Subjective:    [name] is a 72 y.o. old female here with left knee pain.  HPI Left knee pain: Patient is here for her steroid injection for her chronic knee pain.  Patient with history of osteoarthritis.  Reports getting steroid injection every 3 months.  Last steroid injection 4 months ago.  Denies history of trauma or injury.  She denies history of fever, swelling or overlying skin changes.  Denies numbness or tingling in her legs.  She is not on blood thinners.  Denies allergy to Betadine.  History of type 2 diabetes.  Well controlled.  Not on any medication.  PMH/Problem List: has Diabetes mellitus without complication ([place]); Hyperlipidemia; Major depressive disorder, recurrent episode ([place]); CARPAL TUNNEL SYNDROME, LEFT; Essential hypertension; Osteoarthritis of multiple joints; ARTHRITIS, BACK; Disorder of bursae and tendons in shoulder region; SCOLIOSIS; Esophageal abnormality; Substance abuse, episodic; S/P TKR (total knee replacement), RIGHT; Parkinson's disease ([place]); Dysphagia, unspecified(787.20); Cognitive impairment; Urticaria, idiopathic; Left knee pain; Chronic pain syndrome; Tinnitus; CKD stage 3 due to type 2 diabetes mellitus ([place]); Frequent falls; Swelling of lower extremity; Shortness of breath; and Osteoarthritis of left knee on their problem list.   has a past medical history of Allergy, Anxiety, Arthritis, Chronic bronchitis ([place]), Chronic mid back pain, Cognitive impairment (11/21/9831), Complication of anesthesia (07/2008), Esophageal stricture, GERD (gastroesophageal reflux disease), History of gout (1970's), Hyperlipidemia, Hypertension, Movement disorder, Osteoarthritis, Parkinson's disease ([place]), Scoliosis, Situational depression, Type II diabetes mellitus ([place]), and UTI (lower urinary tract infection).  FH:  Family History  Problem Relation Age of Onset  . Heart disease Mother   . Diabetes Mother   . Heart disease Father   . Heart attack Sister   . Heart attack  Brother   . Cerebral palsy Son     [facility] Social History   Tobacco Use  . Smoking status: Never Smoker  . Smokeless tobacco: Never Used  Substance Use Topics  . Alcohol use: No  . Drug use: No    Review of Systems Review of systems negative except for pertinent positives and negatives in history of present illness above.     Objective:     Vitals:   07/28/17 0950  BP: (!) 148/76  Pulse: 76  Temp: 98.4 F (36.9 C)  TempSrc: Oral  SpO2: 97%  Weight: 172 lb 9.6 oz (78.3 kg)  Height: 5\' 4"  (1.626 m)   Body mass index is 29.63 kg/m.  Physical Exam  GEN: appears well & comfortable. No apparent distress. CVS:  no edema RESP: no IWOB MSK:  Left knee Normal to inspection with no erythema or effusion or obvious bony abnormalities. No warmth to touch. Has joint line tenderness medially ROM full in flexion and extension and lower leg rotation. Patellar glide with crepitus. Ligaments with solid consistent endpoints including ACL, PCL, LCL, MCL.  Normal sensation in right leg, foot.   Neurovascularly intact with good distal pulses. SKIN: As above   Procedure:  Injection of left knee Consent obtained and verified. Lateral approach Noted no overlying erythema, induration, or other signs of local infection. Patient not on anticoagulants  Skin prepped in a sterile fashion. Topical analgesic spray: Ethyl chloride. Injection completed without difficulty with Solumedrol 40 mg (1cc) and Lidocaine 1% (4cc) Patient tolerated the procedure well. Advised to call if fevers/chills, erythema, induration, drainage, or persistent bleeding.    Assessment and Plan:  1. Osteoarthritis of left knee, unspecified osteoarthritis type: Chronic issue.  Patient has been on steroid injection.  Last steroid injection about  4 months ago.  Exam with tenderness to palpation along joint line over medial aspect.  No overlying skin erythema, effusion or swelling to suggest infectious etiology.  Steroid  injection given today.  2. Diabetes mellitus without complication ([place]): Well-controlled.  A1c 5.6% today.  Not on any medication  Return if symptoms worsen or fail to improve.  Mercy Riding, MD 07/28/17 Pager: [phone]

## 2017-07-28 NOTE — Telephone Encounter (Addendum)
Spoke with the patient's husband [name] (on [place]). He took the message from Dr. Leta Baptist that the pt's labs are stable and to continue current plan. He verbalized understanding and will pass message to patient. RN encouraged a call back if any questions.   ----- Message from Penni Bombard, MD sent at 07/28/2017  1:15 PM EDT ----- Labs stable. Continue current plan. Please call patient. -VRP

## 2017-07-28 NOTE — Patient Instructions (Signed)
It was great seeing you today! We have addressed the following issues today  Left knee pain: We gave you steroid injection today.  Please let us know if you have severe pain, bleeding, swelling, fever or any other symptoms concerning to you.  Diabetes: You A1c is 5.6% today.    If we did any lab work today, and the results require attention, either me or my nurse will get in touch with you. If everything is normal, you will get a letter in mail and a message via . If you don't hear from [place] in two weeks, please give [place] a call. Otherwise, we look forward to seeing you again at your next visit. If you have any questions or concerns before then, please call the clinic at [phone].  Please bring all your medications to every doctors visit  Sign up for My Chart to have easy access to your labs results, and communication with your Primary care physician.    Please check-out at the front desk before leaving the clinic.    Take Care,   Dr. Cyndia Skeeters

## 2017-07-29 ENCOUNTER — Ambulatory Visit (INDEPENDENT_AMBULATORY_CARE_PROVIDER_SITE_OTHER): Payer: Medicare HMO | Admitting: Family Medicine

## 2017-07-29 ENCOUNTER — Other Ambulatory Visit: Payer: Self-pay

## 2017-07-29 ENCOUNTER — Encounter: Payer: Self-pay | Admitting: Family Medicine

## 2017-07-29 VITALS — BP 154/78 | HR 92 | Temp 98.5°F | Ht 64.0 in | Wt 169.8 lb

## 2017-07-29 DIAGNOSIS — I10 Essential (primary) hypertension: Secondary | ICD-10-CM

## 2017-07-29 DIAGNOSIS — K920 Hematemesis: Secondary | ICD-10-CM

## 2017-07-29 DIAGNOSIS — R1310 Dysphagia, unspecified: Secondary | ICD-10-CM

## 2017-07-29 DIAGNOSIS — G20 Parkinson's disease: Secondary | ICD-10-CM | POA: Diagnosis not present

## 2017-07-29 MED ORDER — OLMESARTAN MEDOXOMIL 5 MG PO TABS: 10.0000 mg | ORAL_TABLET | Freq: Every day | ORAL | 3 refills | Status: DC

## 2017-07-29 MED ORDER — PROPRANOLOL HCL ER 80 MG PO CP24: 80.0000 mg | ORAL_CAPSULE | Freq: Every day | ORAL | 3 refills | Status: DC

## 2017-07-29 NOTE — Patient Instructions (Signed)
I have sent a referral in for a stomach doctor. If you have not heard from them in a week please let me know. I have refilled your Inderal (propranolol) and your Benicar (Olmesartan)---restart both of those. Let me see you in about 4-6 weeks  I will also send a referral to [place] to see if they will come evaluate you for a Home Health Nurse help.

## 2017-07-31 ENCOUNTER — Encounter: Payer: Self-pay | Admitting: Family Medicine

## 2017-07-31 NOTE — Progress Notes (Signed)
    CHIEF COMPLAINT / HPI:  1. Parkinsons: has some questions abot PD. Is here with her daughter. Her neurologist changed dosing frequency of meds. Energy level is baseline. Tremor seems to be increased at times 2. Has had 2 or 3 episodes of goffee graund emesis in last few month. Has fairly chronic nausea that she thougt was related tol her PD meds. Her neurologist wants her to getit checkedd out. No change in bowel movements. 3. Food sometimes sticks when she swallows. No problems with thin liquids most of the time altough occasionally she gets choked on those 4. Needs refills on BP meds. Has not taken them today. Reports missing them pretty frequently--no particular reason.   REVIEW OF SYSTEMS: See HPi. Additiojnally, no chest pain, no headaches.  PERTINENT  PMH / PSH: I have reviewed the patient's medications, allergies, past medical and surgical history, smoking status and updated in the EMR as appropriate.   OBJECTIVE:  Vital signs reviewed. GENERAL: Well-developed, well-nourished, no acute distress. CARDIOVASCULAR: Regular rate and rhythm no murmur gallop or rub LUNGS: Clear to auscultation bilaterally, no rales or wheeze. ABDOMEN: Soft positive bowel sounds NEURO: No gross focal neurological deficits.no TM. She has pill rolling tremor of hands and prominent oral facial dyskinesias. MSK: Movement of extremity x 4. Cogwheel rigidity.     ASSESSMENT / PLAN: 1. Will send to GI for nausea, report of hematemesis and dysphagia. Please see problem oriented charting for details

## 2017-07-31 NOTE — Assessment & Plan Note (Signed)
Discussed and answere questions

## 2017-07-31 NOTE — Assessment & Plan Note (Signed)
Discussed absolute need to take meds on regular basis and have refilled them F/u 1 month to recheck

## 2017-08-04 ENCOUNTER — Inpatient Hospital Stay (HOSPITAL_COMMUNITY)
Admission: EM | Admit: 2017-08-04 | Discharge: 2017-08-07 | DRG: 381 | Disposition: A | Discharge status: Home Health | Payer: Medicare HMO | Attending: Family Medicine | Admitting: Family Medicine

## 2017-08-04 ENCOUNTER — Encounter (HOSPITAL_COMMUNITY): Payer: Self-pay | Admitting: *Deleted

## 2017-08-04 ENCOUNTER — Telehealth: Payer: Self-pay

## 2017-08-04 ENCOUNTER — Other Ambulatory Visit: Payer: Self-pay

## 2017-08-04 DIAGNOSIS — K209 Esophagitis, unspecified without bleeding: Secondary | ICD-10-CM

## 2017-08-04 DIAGNOSIS — G20 Parkinson's disease: Secondary | ICD-10-CM | POA: Diagnosis present

## 2017-08-04 DIAGNOSIS — K210 Gastro-esophageal reflux disease with esophagitis: Secondary | ICD-10-CM | POA: Diagnosis not present

## 2017-08-04 DIAGNOSIS — N390 Urinary tract infection, site not specified: Secondary | ICD-10-CM | POA: Diagnosis present

## 2017-08-04 DIAGNOSIS — K920 Hematemesis: Secondary | ICD-10-CM

## 2017-08-04 DIAGNOSIS — M1990 Unspecified osteoarthritis, unspecified site: Secondary | ICD-10-CM | POA: Diagnosis present

## 2017-08-04 DIAGNOSIS — K449 Diaphragmatic hernia without obstruction or gangrene: Secondary | ICD-10-CM | POA: Diagnosis present

## 2017-08-04 DIAGNOSIS — G259 Extrapyramidal and movement disorder, unspecified: Secondary | ICD-10-CM | POA: Diagnosis present

## 2017-08-04 DIAGNOSIS — E1122 Type 2 diabetes mellitus with diabetic chronic kidney disease: Secondary | ICD-10-CM | POA: Diagnosis not present

## 2017-08-04 DIAGNOSIS — M1712 Unilateral primary osteoarthritis, left knee: Secondary | ICD-10-CM | POA: Diagnosis present

## 2017-08-04 DIAGNOSIS — K5790 Diverticulosis of intestine, part unspecified, without perforation or abscess without bleeding: Secondary | ICD-10-CM | POA: Diagnosis present

## 2017-08-04 DIAGNOSIS — M419 Scoliosis, unspecified: Secondary | ICD-10-CM | POA: Diagnosis present

## 2017-08-04 DIAGNOSIS — G3184 Mild cognitive impairment, so stated: Secondary | ICD-10-CM | POA: Diagnosis present

## 2017-08-04 DIAGNOSIS — Z79899 Other long term (current) drug therapy: Secondary | ICD-10-CM

## 2017-08-04 DIAGNOSIS — F419 Anxiety disorder, unspecified: Secondary | ICD-10-CM | POA: Diagnosis present

## 2017-08-04 DIAGNOSIS — D62 Acute posthemorrhagic anemia: Secondary | ICD-10-CM | POA: Diagnosis not present

## 2017-08-04 DIAGNOSIS — I959 Hypotension, unspecified: Secondary | ICD-10-CM | POA: Diagnosis not present

## 2017-08-04 DIAGNOSIS — Z96651 Presence of right artificial knee joint: Secondary | ICD-10-CM | POA: Diagnosis present

## 2017-08-04 DIAGNOSIS — I129 Hypertensive chronic kidney disease with stage 1 through stage 4 chronic kidney disease, or unspecified chronic kidney disease: Secondary | ICD-10-CM | POA: Diagnosis present

## 2017-08-04 DIAGNOSIS — M546 Pain in thoracic spine: Secondary | ICD-10-CM | POA: Diagnosis present

## 2017-08-04 DIAGNOSIS — K921 Melena: Secondary | ICD-10-CM | POA: Diagnosis present

## 2017-08-04 DIAGNOSIS — J449 Chronic obstructive pulmonary disease, unspecified: Secondary | ICD-10-CM | POA: Diagnosis present

## 2017-08-04 DIAGNOSIS — G709 Myoneural disorder, unspecified: Secondary | ICD-10-CM | POA: Diagnosis present

## 2017-08-04 DIAGNOSIS — K2970 Gastritis, unspecified, without bleeding: Secondary | ICD-10-CM | POA: Diagnosis not present

## 2017-08-04 DIAGNOSIS — R000 Tachycardia, unspecified: Secondary | ICD-10-CM | POA: Diagnosis present

## 2017-08-04 DIAGNOSIS — R1011 Right upper quadrant pain: Secondary | ICD-10-CM | POA: Diagnosis present

## 2017-08-04 DIAGNOSIS — F339 Major depressive disorder, recurrent, unspecified: Secondary | ICD-10-CM | POA: Diagnosis not present

## 2017-08-04 DIAGNOSIS — Z9049 Acquired absence of other specified parts of digestive tract: Secondary | ICD-10-CM

## 2017-08-04 DIAGNOSIS — K922 Gastrointestinal hemorrhage, unspecified: Secondary | ICD-10-CM

## 2017-08-04 DIAGNOSIS — K2211 Ulcer of esophagus with bleeding: Secondary | ICD-10-CM | POA: Diagnosis not present

## 2017-08-04 DIAGNOSIS — Z888 Allergy status to other drugs, medicaments and biological substances status: Secondary | ICD-10-CM

## 2017-08-04 DIAGNOSIS — R402414 Glasgow coma scale score 13-15, 24 hours or more after hospital admission: Secondary | ICD-10-CM | POA: Diagnosis not present

## 2017-08-04 DIAGNOSIS — N183 Chronic kidney disease, stage 3 (moderate): Secondary | ICD-10-CM | POA: Diagnosis present

## 2017-08-04 DIAGNOSIS — G894 Chronic pain syndrome: Secondary | ICD-10-CM | POA: Diagnosis present

## 2017-08-04 DIAGNOSIS — R4702 Dysphasia: Secondary | ICD-10-CM | POA: Diagnosis present

## 2017-08-04 HISTORY — DX: Hematemesis: K92.0

## 2017-08-04 HISTORY — DX: Unspecified dementia, unspecified severity, without behavioral disturbance, psychotic disturbance, mood disturbance, and anxiety: F03.90

## 2017-08-04 LAB — COMPREHENSIVE METABOLIC PANEL
ALT: 14 U/L (ref 14–54)
AST: 17 U/L (ref 15–41)
Albumin: 3.6 g/dL (ref 3.5–5.0)
Alkaline Phosphatase: 72 U/L (ref 38–126)
Anion gap: 11 (ref 5–15)
BUN: 40 mg/dL — ABNORMAL HIGH (ref 6–20)
CO2: 26 mmol/L (ref 22–32)
Calcium: 10.2 mg/dL (ref 8.9–10.3)
Chloride: 103 mmol/L (ref 101–111)
Creatinine, Ser: 1.18 mg/dL — ABNORMAL HIGH (ref 0.44–1.00)
GFR calc Af Amer: 52 mL/min — ABNORMAL LOW (ref >60)
GFR calc non Af Amer: 45 mL/min — ABNORMAL LOW (ref >60)
Glucose, Bld: 195 mg/dL — ABNORMAL HIGH (ref 65–99)
Potassium: 3.7 mmol/L (ref 3.5–5.1)
Sodium: 140 mmol/L (ref 135–145)
Total Bilirubin: 1.1 mg/dL (ref 0.3–1.2)
Total Protein: 6.3 g/dL — ABNORMAL LOW (ref 6.5–8.1)

## 2017-08-04 LAB — ABO/RH: ABO/RH(D): O POS

## 2017-08-04 LAB — CBC
HCT: 39.3 % (ref 36.0–46.0)
Hemoglobin: 12.9 g/dL (ref 12.0–15.0)
MCH: 31.1 pg (ref 26.0–34.0)
MCHC: 32.8 g/dL (ref 30.0–36.0)
MCV: 94.7 fL (ref 78.0–100.0)
Platelets: 451 10*3/uL — ABNORMAL HIGH (ref 150–400)
RBC: 4.15 MIL/uL (ref 3.87–5.11)
RDW: 14 % (ref 11.5–15.5)
WBC: 12.8 10*3/uL — ABNORMAL HIGH (ref 4.0–10.5)

## 2017-08-04 LAB — PROTIME-INR
INR: 0.97
Prothrombin Time: 12.8 seconds (ref 11.4–15.2)

## 2017-08-04 LAB — POC OCCULT BLOOD, ED: Fecal Occult Bld: POSITIVE — AB

## 2017-08-04 MED ORDER — CARBIDOPA-LEVODOPA 25-100 MG PO TABS
1.0000 | ORAL_TABLET | Freq: Every day | ORAL | Status: DC
  Administered 2017-08-04 – 2017-08-07 (×14): 1 via ORAL
  Filled 2017-08-04 (×16): qty 1

## 2017-08-04 MED ORDER — MUSCLE RUB 10-15 % EX CREA
TOPICAL_CREAM | CUTANEOUS | Status: DC | PRN
  Administered 2017-08-04 – 2017-08-06 (×2): via TOPICAL
  Administered 2017-08-06: 1 via TOPICAL
  Administered 2017-08-07: 08:00:00 via TOPICAL
  Filled 2017-08-04 (×2): qty 85

## 2017-08-04 MED ORDER — PROPRANOLOL HCL ER 80 MG PO CP24
80.0000 mg | ORAL_CAPSULE | Freq: Every day | ORAL | Status: DC
  Administered 2017-08-04 – 2017-08-07 (×3): 80 mg via ORAL
  Filled 2017-08-04 (×4): qty 1

## 2017-08-04 MED ORDER — SODIUM CHLORIDE 0.9 % IV SOLN
8.0000 mg/h | INTRAVENOUS | Status: DC
  Administered 2017-08-04 – 2017-08-05 (×3): 8 mg/h via INTRAVENOUS
  Filled 2017-08-04 (×6): qty 80

## 2017-08-04 MED ORDER — SODIUM CHLORIDE 0.9 % IV BOLUS
1000.0000 mL | Freq: Once | INTRAVENOUS | Status: AC
  Administered 2017-08-04: 1000 mL via INTRAVENOUS

## 2017-08-04 MED ORDER — SODIUM CHLORIDE 0.9 % IV SOLN
80.0000 mg | Freq: Once | INTRAVENOUS | Status: AC
  Administered 2017-08-04: 80 mg via INTRAVENOUS
  Filled 2017-08-04: qty 80

## 2017-08-04 MED ORDER — ONDANSETRON HCL 4 MG/2ML IJ SOLN
4.0000 mg | Freq: Once | INTRAMUSCULAR | Status: AC
  Administered 2017-08-04: 4 mg via INTRAVENOUS
  Filled 2017-08-04: qty 2

## 2017-08-04 MED ORDER — SERTRALINE HCL 100 MG PO TABS
100.0000 mg | ORAL_TABLET | Freq: Every day | ORAL | Status: DC
  Administered 2017-08-04 – 2017-08-07 (×3): 100 mg via ORAL
  Filled 2017-08-04 (×3): qty 1

## 2017-08-04 NOTE — ED Notes (Signed)
Muscle rub has greatly improved patient's back pain per pt's report.

## 2017-08-04 NOTE — Consult Note (Signed)
Consultation  Referring Provider: Teaching service/Dr Burr Medico Primary Care Physician:  [name], MD Primary Gastroenterologist:  none  Reason for Consultation:  Coffee ground emesis  HPI: [name] is a 72 y.o. female who is being admitted through the emergency room after presenting late last evening with several episodes of hematemesis. Patient and husband state that she had been having some vomiting off and on over the past 3 or 4 days containing black coffee-ground appearing material.  Last evening she had an increase in vomiting and according to her husband brought up some large clots. Patient is also been noticing dark stools over the past few days.  She says about a month ago she also had an episode or 2 of coffee-ground emesis. Patient has been having increasing difficulty with dysphasia over the past few months, primarily to solid foods and feels as if they are hanging up in her esophagus.  Sometimes if she stops eating the food will go on down, at other times she has to regurgitate.  She says that most of the time she will do okay with liquids.  She has been having a lot of difficulty with heartburn and indigestion despite taking omeprazole on a daily basis.  Her husband states her appetite has not been good in general.  Patient denies any abdominal pain. She had been taking a baby aspirin regularly, stopped several weeks ago, no NSAID use and no blood thinners. Patient has history of Parkinson's disease, depression, mild cognitive impairment, chronic kidney disease stage III, osteoarthritis with chronic pain and hypertension. She has had prior endoscopy.  She and her husband cannot remember exactly when she had that done believe it was in [facility] several years ago at which time she required an esophageal dilation for dysphasia. She also has had remote colonoscopy over 10 years ago, perhaps in [place] and was told she had diverticulosis.  There are no  records available in epic.  Labs on admission; CBC of 12.8, hemoglobin 12.9 hematocrit 39.3, MCV of 94 BUN 40, creatinine 1.18, LFTs within normal limits, INR 0.97, she is heme positive  Hgb  was also done on 07/27/2017 hemoglobin 12.9 at that time as well.   Past Medical History:  Diagnosis Date  . Allergy   . Anxiety   . Arthritis    "knees, back" (06/29/2014)  . Chronic bronchitis ([place])    "get it q yr"  . Chronic mid back pain   . Cognitive impairment 12/11/2013  . Complication of anesthesia 07/2008   "hard to get me woke up when I had my knee replaced; they said they had to bring me back"  . Esophageal stricture   . GERD (gastroesophageal reflux disease)   . History of gout 1970's  . Hyperlipidemia   . Hypertension   . Movement disorder   . Osteoarthritis   . Parkinson's disease ([place])   . Scoliosis   . Situational depression   . Type II diabetes mellitus ([place])   . UTI (lower urinary tract infection)     Past Surgical History:  Procedure Laterality Date  . ABDOMINAL HYSTERECTOMY    . CHOLECYSTECTOMY OPEN    . COLON SURGERY    . DILATION AND CURETTAGE OF UTERUS    . ESOPHAGOGASTRIC FUNDOPLASTY     some type "esoph surgery" per pt  . JOINT REPLACEMENT    . TOTAL KNEE ARTHROPLASTY Right 07/2008  . TUBAL LIGATION      Prior to Admission medications  Medication Sig Start Date End Date Taking? Authorizing Provider  carbidopa-levodopa (SINEMET IR) 25-100 MG tablet Take 1 tablet by mouth 5 (five) times daily. 07/27/17  Yes Penumalli, Earlean Polka, MD  gabapentin (NEURONTIN) 300 MG capsule Take 300-600 mg by mouth See admin instructions. Take  2 capsules in the morning and 1 capsule in the evening as needed 07/10/16  Yes Plovsky, Berneta Sages, MD  olmesartan (BENICAR) 5 MG tablet Take 2 tablets (10 mg total) by mouth daily. 07/29/17  Yes [name], MD  omeprazole (PRILOSEC) 40 MG capsule TAKE 1 CAPSULE BY MOUTH EVERY DAY 09/29/16  Yes [name], MD  propranolol ER (INDERAL LA) 80  MG 24 hr capsule Take 1 capsule (80 mg total) by mouth daily. 07/29/17  Yes [name], MD  sertraline (ZOLOFT) 100 MG tablet Take 100 mg by mouth daily. Per psych 12/29/13  Yes [provider]  traMADol (ULTRAM) 50 MG tablet TAKE 1 TO 2 TABLETS BY MOUTH TWICE DAILY AS NEEDED FOR PAIN 07/30/17  Yes [name], MD  Blood Glucose Monitoring Suppl (ACCU-CHEK AVIVA PLUS) w/Device KIT Use to check blood sugar daily. ICD-10 code: E11.9 09/29/16   [name], MD  ONE [facility] ULTRA TEST test strip USE AS DIRECTED 09/04/14   [name], MD  [facility] LANCETS 84Z MISC Daily for Glucose Testing 11/28/14   [name], MD    Current Facility-Administered Medications  Medication Dose Route Frequency Provider Last Rate Last Dose  . carbidopa-levodopa (SINEMET IR) 25-100 MG per tablet immediate release 1 tablet  1 tablet Oral 5 X Daily Everrett Coombe, MD      . pantoprazole (PROTONIX) 80 mg in sodium chloride 0.9 % 250 mL (0.32 mg/mL) infusion  8 mg/hr Intravenous Continuous Antonietta Breach, PA-C 25 mL/hr at 08/04/17 0635 8 mg/hr at 08/04/17 0635  . propranolol ER (INDERAL LA) 24 hr capsule 80 mg  80 mg Oral Daily Everrett Coombe, MD      . sertraline (ZOLOFT) tablet 100 mg  100 mg Oral Daily Everrett Coombe, MD       Current Outpatient Medications  Medication Sig Dispense Refill  . carbidopa-levodopa (SINEMET IR) 25-100 MG tablet Take 1 tablet by mouth 5 (five) times daily. 150 tablet 12  . gabapentin (NEURONTIN) 300 MG capsule Take 300-600 mg by mouth See admin instructions. Take  2 capsules in the morning and 1 capsule in the evening as needed    . olmesartan (BENICAR) 5 MG tablet Take 2 tablets (10 mg total) by mouth daily. 180 tablet 3  . omeprazole (PRILOSEC) 40 MG capsule TAKE 1 CAPSULE BY MOUTH EVERY DAY 90 capsule 3  . propranolol ER (INDERAL LA) 80 MG 24 hr capsule Take 1 capsule (80 mg total) by mouth daily. 90 capsule 3  . sertraline (ZOLOFT) 100 MG tablet Take 100 mg by mouth daily. Per  psych    . traMADol (ULTRAM) 50 MG tablet TAKE 1 TO 2 TABLETS BY MOUTH TWICE DAILY AS NEEDED FOR PAIN 120 tablet 0  . Blood Glucose Monitoring Suppl (ACCU-CHEK AVIVA PLUS) w/Device KIT Use to check blood sugar daily. ICD-10 code: E11.9 1 kit 0  . ONE TOUCH ULTRA TEST test strip USE AS DIRECTED 100 each 12  . ONETOUCH DELICA LANCETS 66A MISC Daily for Glucose Testing 300 each 1    Allergies as of 08/04/2017 - Review Complete 08/04/2017  Allergen Reaction Noted  . Ace inhibitors Anaphylaxis and Swelling 03/11/2012  . Azilect [  rasagiline mesylate] Other (See Comments) 09/26/2010  . Lisinopril Swelling 05/21/2005    Family History  Problem Relation Age of Onset  . Heart disease Mother   . Diabetes Mother   . Heart disease Father   . Heart attack Sister   . Heart attack Brother   . Cerebral palsy Son     Social History   Socioeconomic History  . Marital status: Married    Spouse name: [name]  . Number of children: 2  . Years of education: 71  . Highest education level: Not on file  Occupational History  . Occupation: Retired    Fish farm manager: NOT EMPLOYED  Social Needs  . Financial resource strain: Not on file  . Food insecurity:    Worry: Not on file    Inability: Not on file  . Transportation needs:    Medical: Not on file    Non-medical: Not on file  Tobacco Use  . Smoking status: Never Smoker  . Smokeless tobacco: Never Used  Substance and Sexual Activity  . Alcohol use: No  . Drug use: No  . Sexual activity: Not on file  Lifestyle  . Physical activity:    Days per week: Not on file    Minutes per session: Not on file  . Stress: Not on file  Relationships  . Social connections:    Talks on phone: Not on file    Gets together: Not on file    Attends religious service: Not on file    Active member of club or organization: Not on file    Attends meetings of clubs or organizations: Not on file    Relationship status: Not on file  . Intimate partner violence:     Fear of current or ex partner: Not on file    Emotionally abused: Not on file    Physically abused: Not on file    Forced sexual activity: Not on file  Other Topics Concern  . Not on file  Social History Narrative   Health Care POA:    Emergency Contact: daughter, [name], [phone] husband, [name] [phone]   End of Life Plan:   Who lives with you: Lives with husband and son, [name]   Any pets: Rabbit, [name]   Diet: Patient has a varied diet but struggles with what to eat with hypertension, diabetes, parkinsons   Exercise: Patient does not have any regular exercise routine.   Seatbelts: Patient reports wearing her seatbelt when in vehicle.   Nancy Fetter Exposure/Protection: Patient reports wearing sun block lotion daily.   Hobbies: Watching game shows, writing poetry, writing in journal, volunteering at day program with son.    Caffeine Use: very little occasional          Review of Systems: Pertinent positive and negative review of systems were noted in the above HPI section.  All other review of systems was otherwise negative.  Physical Exam: Vital signs in last 24 hours: Temp:  [97.8 F (36.6 C)] 97.8 F (36.6 C) (04/23 0441) Pulse Rate:  [88-120] 100 (04/23 0830) Resp:  [14-20] 14 (04/23 0830) BP: (102-145)/(73-86) 144/86 (04/23 0830) SpO2:  [90 %-100 %] 98 % (04/23 0830) Weight:  [169 lb (76.7 kg)] 169 lb (76.7 kg) (04/23 0442)   General:   Alert,  Well-developed, well-nourished, older white female pleasant and cooperative in NAD.  Husband at bedside Head:  Normocephalic and atraumatic. Eyes:  Sclera clear, no icterus.   Conjunctiva pink. Ears:  Normal auditory acuity. Nose:  No deformity,  discharge,  or lesions. Mouth:  No deformity or lesions.   Neck:  Supple; no masses or thyromegaly. Lungs:  Clear throughout to auscultation.   No wheezes, crackles, or rhonchi. Heart:  Regular rate and rhythm; no murmurs, clicks, rubs,  or gallops. Abdomen:  Soft,nontender, BS  active,nonpalp mass or hsm.   Rectal:  Deferred, documented heme positive Msk:  Symmetrical without gross deformities. . Pulses:  Normal pulses noted. Extremities:  Without clubbing or edema. Neurologic:  Alert and  oriented x4; non-intention tremor Skin:  Intact without significant lesions or rashes.. Psych:  Alert and cooperative. Normal mood and affect.  Intake/Output from previous day: 04/22 0701 - 04/23 0700 In: 100 [IV Piggyback:100] Out: -  Intake/Output this shift: Total I/O In: 2000 [IV Piggyback:2000] Out: -   Lab Results: Recent Labs    08/04/17 0447  WBC 12.8*  HGB 12.9  HCT 39.3  PLT 451*   BMET Recent Labs    08/04/17 0447  NA 140  K 3.7  CL 103  CO2 26  GLUCOSE 195*  BUN 40*  CREATININE 1.18*  CALCIUM 10.2   LFT Recent Labs    08/04/17 0447  PROT 6.3*  ALBUMIN 3.6  AST 17  ALT 14  ALKPHOS 72  BILITOT 1.1   PT/INR Recent Labs    08/04/17 0602  LABPROT 12.8  INR 0.97   Hepatitis Panel No results for input(s): HEPBSAG, HCVAB, HEPAIGM, HEPBIGM in the last 72 hours.     IMPRESSION:  #76 72 year old white female with 3-day history of intermittent nausea and vomiting with coffee-ground emesis.  Patient had increase in eating last evening at home with passage of some clots in addition to black material. Patient with history of chronic GERD, and by history previous esophageal stricture requiring remote dilation. Patient states she has been having problems with solid food dysphagia over the past several months somewhat progressive. Suspect recurrent esophageal stricture and bleeding secondary to ulcerative esophagitis versus Mallory-Weiss tear versus less likely esophageal neoplasm  She has been hemodynamically stable and hemoglobin stable over the past week.  #2 Parkinson's disease #3 mild cognitive impairment #4 hypertension #5 osteo-arthritis with chronic pain #6 hypertension #7 diverticulosis by history-colon cancer  surveillance-last colonoscopy 10+ years ago  Plan; Clear liquids today, n.p.o. after midnight IV PPI twice daily Elevate head of bed Patient has been scheduled for upper endoscopy with possible esophageal dilation with Dr. Hilarie Fredrickson in a.m. tomorrow.  Procedure was discussed in detail with the patient including indications risks and benefits and she is agreeable to proceed Serial hemoglobins, and transfuse as indicated Patient should have colonoscopy at some point, this can be done as an outpatient.   Sulma Ruffino  08/04/2017, 10:05 AM

## 2017-08-04 NOTE — Telephone Encounter (Signed)
Completed PA info in CoverMyMeds for olmesartan.  Status pending.  Will recheck status in 24 hours. Wallace Cullens, RN

## 2017-08-04 NOTE — ED Notes (Signed)
Pt changed; [place] hooked back up. No needs. Clear tray ordered

## 2017-08-04 NOTE — ED Notes (Signed)
Meal Tray delivered and at bedside.  

## 2017-08-04 NOTE — Progress Notes (Signed)
Patient trasfered from ED to 5W08 via stretcher; alert and oriented x 4; no complaints of pain; IV in RAC running Protonox @25cc /hr; skin intact. Orient patient to room and unit; gave patient care guide; instructed how to use the call bell and  fall risk precautions. Will continue to monitor the patient.

## 2017-08-04 NOTE — H&P (Signed)
[facility] Admission History and Physical Service Pager: [phone]  [name] Medical record number: [id number] Date of birth: October 11, 1945 Age: 72 y.o. Gender: female  Primary Care Provider: [name], MD Consultants: GI Code Status: Full, confirmed on admission  Chief Complaint: Hematemesis  Assessment and Plan: KYRIELLE Young is a 72 y.o. female presenting with hematemesis . PMH is significant for parkinsons disease, HTN, MDD, CKD 3, OA/chronic pain  Hematemesis: Acute on chronic, worsened over past 2-3 days with >5 episodes, nurse at bedside said pt initially endorsed 15 episodes. Patient has remained hemodynamically stable since presentation to ED and reportedly has not vomited since arrival in ED. Mildly tachycardic at 105, though likely due for beta blocker and BP stable at 131/82. Receiving bolus in ED. On home prilosec 40 mg daily. Hemoglobin stable at 12.9, from 12.9 when checked 8d ago. BUN elevated to 40 (11 8 days ago) suggestive of GI bleed and FOBT positive. Patient reports she has not established with GI. Previous office notes indicate she endorsed hx of colonoscopy with polyps in the past but these results do not appear to be available in the chart. - Admit to FPTS attending Dr. Ardelia Mems - monitor on telemetry - zofran PRN nausea - continue PPI 8 mg/hr started in ED - consult to GI this AM for likely inpatient scope - monitor vitals, consider mIVF after bolus completes - continue propalonol 80 mg daily - NPO/sips with meds until eval by GI  HTN -  Controlled on admission with BP 131/82. Olmesartan 10 mg daily as home med. - restart home ARB if pt becomes hypertensive, held for now  MDD - Home med sertroline (zoloft) 100 mg daily. Restarted on admission.  OA/chronic pain syndrome - home medications gabapentin 600 mg qAM, 300 mg qPM, Tramadol 50-100 mg BID PRN pain.  Parkinson's disease - Home medication  carbidopa-levodopa 1 tab 5 times daily.  CKD 3 - Cr 1.18 on admission, appears to be at baseline. - monitor BMP - avoid nephrotoxic agents  FEN/GI: NPO pending GI recs, mIVF Prophylaxis: SCDs  Disposition: admit to FPTS, telemetry  History of Present Illness:  [name] is a 72 y.o. female presenting with melena and coffee ground hematemesis.  Patient was in her usual state of health until 3-4 weeks ago when she notes flulike symptoms and developed coffee-ground emesis and chronic nausea.  She was seen by her PCP on 4/17 and at that time endorsed 2-3 episodes of coffee-ground emesis in the last few months as well as chronic nausea.  One day ago patient endorses several episodes of coffee-ground emesis that progressed to dark red emesis.  She initially reported 15 episodes of emesis to the nurse, however on admission endorses 6 episodes.  She endorses right upper quadrant abdominal pain, as well as melena, no diarrhea.  No fevers.  No palpitations, dizziness, chest pain.   Review Of Systems: Per HPI. Additionally, no dysuria, urinary frequency or urgency. No productive cough. No recent fevers.  ROS  Patient Active Problem List   Diagnosis Date Noted  . Hematemesis 08/04/2017  . Osteoarthritis of left knee 07/28/2017  . Swelling of lower extremity 11/14/2016  . Shortness of breath 11/14/2016  . Frequent falls 05/14/2016  . Tinnitus 02/15/2015  . CKD stage 3 due to type 2 diabetes mellitus ([place]) 02/15/2015  . Chronic pain syndrome 06/15/2014  . Left knee pain 05/17/2014  . Urticaria, idiopathic 12/21/2013  . Cognitive impairment 12/11/2013  .  Dysphagia, unspecified(787.20) 06/02/2013  . Parkinson's disease ([place]) 01/18/2013  . 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 Substance abuse, episodic 09/20/2010  . Esophageal abnormality 08/12/2010  . ARTHRITIS, BACK 03/28/2009  . SCOLIOSIS 03/28/2009  . Disorder of bursae and tendons in shoulder region 01/24/2009  .  CARPAL TUNNEL SYNDROME, LEFT 01/05/2008  . Diabetes mellitus without complication ([place]) 03/47/4259  . Hyperlipidemia 08/05/2006  . Essential hypertension 08/05/2006  . Osteoarthritis of multiple joints 08/04/2006  . Major depressive disorder, recurrent episode ([place]) 06/11/2006    Past Medical History: Past Medical History:  Diagnosis Date  . Allergy   . Anxiety   . Arthritis    "knees, back" (06/29/2014)  . Chronic bronchitis ([place])    "get it q yr"  . Chronic mid back pain   . Cognitive impairment 12/11/2013  . Complication of anesthesia 07/2008   "hard to get me woke up when I had my knee replaced; they said they had to bring me back"  . Esophageal stricture   . GERD (gastroesophageal reflux disease)   . History of gout 1970's  . Hyperlipidemia   . Hypertension   . Movement disorder   . Osteoarthritis   . Parkinson's disease ([place])   . Scoliosis   . Situational depression   . Type II diabetes mellitus ([place])   . UTI (lower urinary tract infection)     Past Surgical History: Past Surgical History:  Procedure Laterality Date  . ABDOMINAL HYSTERECTOMY    . CHOLECYSTECTOMY OPEN    . COLON SURGERY    . DILATION AND CURETTAGE OF UTERUS    . ESOPHAGOGASTRIC FUNDOPLASTY     some type "esoph surgery" per pt  . JOINT REPLACEMENT    . TOTAL KNEE ARTHROPLASTY Right 07/2008  . TUBAL LIGATION      Social History: Social History   Tobacco Use  . Smoking status: Never Smoker  . Smokeless tobacco: Never Used  Substance Use Topics  . Alcohol use: No  . Drug use: No   Additional social history:   Please also refer to relevant sections of EMR.  Family History: Family History  Problem Relation Age of Onset  . Heart disease Mother   . Diabetes Mother   . Heart disease Father   . Heart attack Sister   . Heart attack Brother   . Cerebral palsy Son    (If not completed, MUST add something in)  Allergies and Medications: Allergies  Allergen Reactions  . Ace Inhibitors  Anaphylaxis and Swelling  . Azilect [Rasagiline Mesylate] Other (See Comments)    hypotension  . Lisinopril Swelling    Severe facial angioedema requiring hospitalization 2007 (approx)   No current facility-administered medications on file prior to encounter.    Current Outpatient Medications on File Prior to Encounter  Medication Sig Dispense Refill  . carbidopa-levodopa (SINEMET IR) 25-100 MG tablet Take 1 tablet by mouth 5 (five) times daily. 150 tablet 12  . gabapentin (NEURONTIN) 300 MG capsule Take 300-600 mg by mouth See admin instructions. Take  2 capsules in the morning and 1 capsule in the evening as needed    . olmesartan (BENICAR) 5 MG tablet Take 2 tablets (10 mg total) by mouth daily. 180 tablet 3  . omeprazole (PRILOSEC) 40 MG capsule TAKE 1 CAPSULE BY MOUTH EVERY DAY 90 capsule 3  . propranolol ER (INDERAL LA) 80 MG 24 hr capsule Take 1 capsule (80 mg total) by mouth daily. 90 capsule 3  . sertraline (  ZOLOFT) 100 MG tablet Take 100 mg by mouth daily. Per psych    . traMADol (ULTRAM) 50 MG tablet TAKE 1 TO 2 TABLETS BY MOUTH TWICE DAILY AS NEEDED FOR PAIN 120 tablet 0  . Blood Glucose Monitoring Suppl (ACCU-CHEK AVIVA PLUS) w/Device KIT Use to check blood sugar daily. ICD-10 code: E11.9 1 kit 0  . ONE TOUCH ULTRA TEST test strip USE AS DIRECTED 100 each 12  . ONETOUCH DELICA LANCETS 16X MISC Daily for Glucose Testing 300 each 1    Objective: BP 131/82   Pulse (!) 105   Temp 97.8 F (36.6 C) (Oral)   Resp 16   Ht 5' 4"  (1.626 m)   Wt 169 lb (76.7 kg)   SpO2 99%   BMI 29.01 kg/m  Exam: General: NAD, rests in bed Eyes: PERRL, EOMI, no scleral icterus or conjunctival pallor ENTM: MMM, no dried blood noted in mouth Neck: supple Cardiovascular: RRR, no mr//g Respiratory: CTA bil, no W/R/R Gastrointestinal: soft, mildly tender in epigastric region without rebound or guarding, no HSM, neg murphy sign MSK: moes 4 extremities equally Derm: no rashes or lesions Neuro:  CN II-XII intact, +PD tremor noted in her mouth and hands Psych: AAOx3, affect appropriate  Labs and Imaging: CBC BMET  Recent Labs  Lab 08/04/17 0447  WBC 12.8*  HGB 12.9  HCT 39.3  PLT 451*   Recent Labs  Lab 08/04/17 0447  NA 140  K 3.7  CL 103  CO2 26  BUN 40*  CREATININE 1.18*  GLUCOSE 195*  CALCIUM 10.2     No results found.   Everrett Coombe, MD 08/04/2017, 6:34 AM PGY-2, [facility] pager: [phone], text pages welcome

## 2017-08-04 NOTE — ED Provider Notes (Signed)
[facility] EMERGENCY DEPARTMENT Provider Note   CSN: [id number] Arrival date & time: 08/04/17  3536     History   Chief Complaint Chief Complaint  Patient presents with  . Emesis    HPI [name] is a 72 y.o. female.   72 year old female with a history of hypertension, dyslipidemia, Parkinson's disease, diabetes mellitus, esophageal reflux, esophageal stricture presents to the emergency department for evaluation of hematemesis.  She reports intermittent hematemesis over the past few months.  She was seen by her primary care doctor on 07/29/2017 and recommended to follow-up with gastroenterology.  Is supposed to be on Omeprazole daily.  No use of chronic anticoagulation.  She reports onset of multiple episodes of hematemesis today, greater than 5 total episodes.  They initially began as coffee ground emesis, but have become more consistent with bright red blood and clots.  The patient has had intermittent melena the last of which was earlier in the day yesterday.  She reports some mild right upper quadrant pain.  This is nonradiating.  No associated fevers, urinary symptoms.  She is complaining of sensation of dry mouth.     Past Medical History:  Diagnosis Date  . Allergy   . Anxiety   . Arthritis    "knees, back" (06/29/2014)  . Chronic bronchitis ([place])    "get it q yr"  . Chronic mid back pain   . Cognitive impairment 12/11/2013  . Complication of anesthesia 07/2008   "hard to get me woke up when I had my knee replaced; they said they had to bring me back"  . Esophageal stricture   . GERD (gastroesophageal reflux disease)   . History of gout 1970's  . Hyperlipidemia   . Hypertension   . Movement disorder   . Osteoarthritis   . Parkinson's disease ([place])   . Scoliosis   . Situational depression   . Type II diabetes mellitus ([place])   . UTI (lower urinary tract infection)     Patient Active Problem List   Diagnosis Date Noted  . Osteoarthritis  of left knee 07/28/2017  . Swelling of lower extremity 11/14/2016  . Shortness of breath 11/14/2016  . Frequent falls 05/14/2016  . Tinnitus 02/15/2015  . CKD stage 3 due to type 2 diabetes mellitus ([place]) 02/15/2015  . Chronic pain syndrome 06/15/2014  . Left knee pain 05/17/2014  . Urticaria, idiopathic 12/21/2013  . Cognitive impairment 12/11/2013  . Dysphagia, unspecified(787.20) 06/02/2013  . Parkinson's disease ([place]) 01/18/2013  . 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 Substance abuse, episodic 09/20/2010  . Esophageal abnormality 08/12/2010  . ARTHRITIS, BACK 03/28/2009  . SCOLIOSIS 03/28/2009  . Disorder of bursae and tendons in shoulder region 01/24/2009  . CARPAL TUNNEL SYNDROME, LEFT 01/05/2008  . Diabetes mellitus without complication ([place]) 14/43/1540  . Hyperlipidemia 08/05/2006  . Essential hypertension 08/05/2006  . Osteoarthritis of multiple joints 08/04/2006  . Major depressive disorder, recurrent episode ([place]) 06/11/2006    Past Surgical History:  Procedure Laterality Date  . ABDOMINAL HYSTERECTOMY    . CHOLECYSTECTOMY OPEN    . COLON SURGERY    . DILATION AND CURETTAGE OF UTERUS    . ESOPHAGOGASTRIC FUNDOPLASTY     some type "esoph surgery" per pt  . JOINT REPLACEMENT    . TOTAL KNEE ARTHROPLASTY Right 07/2008  . TUBAL LIGATION       OB History   None      Home Medications    Prior to  Admission medications   Medication Sig Start Date End Date Taking? Authorizing Provider  Blood Glucose Monitoring Suppl (ACCU-CHEK AVIVA PLUS) w/Device KIT Use to check blood sugar daily. ICD-10 code: E11.9 09/29/16   [name], MD  carbidopa-levodopa (SINEMET IR) 25-100 MG tablet Take 1 tablet by mouth 5 (five) times daily. 07/27/17   Penumalli, Earlean Polka, MD  gabapentin (NEURONTIN) 300 MG capsule Patient reported taking 2 capsules in the morning and 1 capsule in the evening. 07/10/16   Plovsky, Berneta Sages, MD  olmesartan (BENICAR) 5 MG tablet Take 2  tablets (10 mg total) by mouth daily. 07/29/17   [name], MD  omeprazole (PRILOSEC) 40 MG capsule TAKE 1 CAPSULE BY MOUTH EVERY DAY 09/29/16   [name], MD  ONE [facility] ULTRA TEST test strip USE AS DIRECTED 09/04/14   [name], MD  [facility] DELICA LANCETS 57W MISC Daily for Glucose Testing 11/28/14   [name], MD  propranolol ER (INDERAL LA) 80 MG 24 hr capsule Take 1 capsule (80 mg total) by mouth daily. 07/29/17   [name], MD  sertraline (ZOLOFT) 100 MG tablet Take 100 mg by mouth daily. Per psych 12/29/13   [provider]  temazepam (RESTORIL) 15 MG capsule Take 1 capsule by mouth at bedtime. Per psychiatry 11/02/16   [provider]  traMADol (ULTRAM) 50 MG tablet TAKE 1 TO 2 TABLETS BY MOUTH TWICE DAILY AS NEEDED FOR PAIN 07/30/17   [name], MD    Family History Family History  Problem Relation Age of Onset  . Heart disease Mother   . Diabetes Mother   . Heart disease Father   . Heart attack Sister   . Heart attack Brother   . Cerebral palsy Son     Social History Social History   Tobacco Use  . Smoking status: Never Smoker  . Smokeless tobacco: Never Used  Substance Use Topics  . Alcohol use: No  . Drug use: No     Allergies   Ace inhibitors; Azilect [rasagiline mesylate]; and Lisinopril   Review of Systems Review of Systems Ten systems reviewed and are negative for acute change, except as noted in the HPI.    Physical Exam Updated Vital Signs BP 112/80   Pulse (!) 109   Temp 97.8 F (36.6 C) (Oral)   Resp 15   Ht _0  (1.626 m)   Wt 76.7 kg (169 lb)   SpO2 98%   BMI 29.01 kg/m   Physical Exam  Constitutional: She is oriented to person, place, and time. She appears well-developed and well-nourished. No distress.  Patient chronically ill appearing, nontoxic.  HENT:  Head: Normocephalic and atraumatic.  Dry mm. Lip smacking.  Eyes: Conjunctivae and EOM are normal. No scleral icterus.  Neck: Normal range of motion.    Cardiovascular: Regular rhythm and intact distal pulses.  Tachycardic   Pulmonary/Chest: Effort normal. No stridor. No respiratory distress. She has no wheezes. She has no rales.  Lungs CTAB  Abdominal: Soft. She exhibits no mass. There is no guarding.  Mild RUQ TTP. Abdomen soft, obese. No peritoneal signs.  Genitourinary:  Genitourinary Comments: Dark brown mixed with black stool. Normal rectal tone. No hematochezia.  Musculoskeletal: Normal range of motion.  Neurological: She is alert and oriented to person, place, and time. She exhibits normal muscle tone. Coordination normal.  Skin: Skin is warm and dry. No rash noted. She is not diaphoretic. No erythema. No pallor.  Psychiatric:  She has a normal mood and affect. Her behavior is normal.  Nursing note and vitals reviewed.    ED Treatments / Results  Labs (all labs ordered are listed, but only abnormal results are displayed) Labs Reviewed  COMPREHENSIVE METABOLIC PANEL - Abnormal; Notable for the following components:      Result Value   Glucose, Bld 195 (*)    BUN 40 (*)    Creatinine, Ser 1.18 (*)    Total Protein 6.3 (*)    GFR calc non Af Amer 45 (*)    GFR calc Af Amer 52 (*)    All other components within normal limits  CBC - Abnormal; Notable for the following components:   WBC 12.8 (*)    Platelets 451 (*)    All other components within normal limits  PROTIME-INR  POC OCCULT BLOOD, ED  TYPE AND SCREEN  ABO/RH    EKG None  Radiology No results found.  Procedures Procedures (including critical care time)  Medications Ordered in ED Medications  sodium chloride 0.9 % bolus 1,000 mL (1,000 mLs Intravenous New Bag/Given 08/04/17 0600)  pantoprazole (PROTONIX) 80 mg in sodium chloride 0.9 % 100 mL IVPB (has no administration in time range)  pantoprazole (PROTONIX) 80 mg in sodium chloride 0.9 % 250 mL (0.32 mg/mL) infusion (has no administration in time range)  sodium chloride 0.9 % bolus 1,000 mL (has no  administration in time range)  ondansetron (ZOFRAN) injection 4 mg (4 mg Intravenous Given 08/04/17 0606)    CRITICAL CARE Performed by: Antonietta Breach   Total critical care time: 45 minutes  Critical care time was exclusive of separately billable procedures and treating other patients.  Critical care was necessary to treat or prevent imminent or life-threatening deterioration.  Critical care was time spent personally by me on the following activities: development of treatment plan with patient and/or surrogate as well as nursing, discussions with consultants, evaluation of patient's response to treatment, examination of patient, obtaining history from patient or surrogate, ordering and performing treatments and interventions, ordering and review of laboratory studies, ordering and review of radiographic studies, pulse oximetry and re-evaluation of patient's condition.   6:24 AM Hemoccult positive.  6:31 AM Case discussed with family medicine resident who will assess in the ED for admission.   Initial Impression / Assessment and Plan / ED Course  I have reviewed the triage vital signs and the nursing notes.  Pertinent labs & imaging results that were available during my care of the patient were reviewed by me and considered in my medical decision making (see chart for details).     72 year old female presents to the emergency department for evaluation of hematemesis.  No active vomiting during my assessment.  Her blood pressure has been stable since arrival.  Initially with normal sinus rhythm, though has developed tachycardia which improved following administration of IV fluids.  Patient also given Protonix bolus.  Protonix drip ordered.  Laboratory workup today significant for leukocytosis and elevated BUN.  In the setting of hematemesis, this is concerning for acute upper GI bleed.  Patient will likely require inpatient endoscopy for localization of bleeding.  She has heme positive  occult stool today.  No hx of chronic anticoagulation.  Patient to be admitted by [facility] Medicine service for continued management.   Final Clinical Impressions(s) / ED Diagnoses   Final diagnoses:  UGI bleed    ED Discharge Orders    None       Antonietta Breach, PA-C  08/04/17 [place], [place], MD 08/04/17 [phone]

## 2017-08-04 NOTE — ED Notes (Signed)
Pt care assumed, obtained verbal report.  Family is at bedside.  Pt denies any complaints.  She is waiting for a bed assignment.  Pt is A&Ox 4.

## 2017-08-04 NOTE — ED Notes (Signed)
Attempted to call report to 5W, receiving nurse is in a pt's room and will return call

## 2017-08-04 NOTE — H&P (View-Only) (Signed)
Consultation  Referring Provider: Teaching service/Dr Burr Medico Primary Care Physician:  [name], MD Primary Gastroenterologist:  none  Reason for Consultation:  Coffee ground emesis  HPI: [name] is a 72 y.o. female who is being admitted through the emergency room after presenting late last evening with several episodes of hematemesis. Patient and husband state that she had been having some vomiting off and on over the past 3 or 4 days containing black coffee-ground appearing material.  Last evening she had an increase in vomiting and according to her husband brought up some large clots. Patient is also been noticing dark stools over the past few days.  She says about a month ago she also had an episode or 2 of coffee-ground emesis. Patient has been having increasing difficulty with dysphasia over the past few months, primarily to solid foods and feels as if they are hanging up in her esophagus.  Sometimes if she stops eating the food will go on down, at other times she has to regurgitate.  She says that most of the time she will do okay with liquids.  She has been having a lot of difficulty with heartburn and indigestion despite taking omeprazole on a daily basis.  Her husband states her appetite has not been good in general.  Patient denies any abdominal pain. She had been taking a baby aspirin regularly, stopped several weeks ago, no NSAID use and no blood thinners. Patient has history of Parkinson's disease, depression, mild cognitive impairment, chronic kidney disease stage III, osteoarthritis with chronic pain and hypertension. She has had prior endoscopy.  She and her husband cannot remember exactly when she had that done believe it was in [facility] several years ago at which time she required an esophageal dilation for dysphasia. She also has had remote colonoscopy over 10 years ago, perhaps in [place] and was told she had diverticulosis.  There are no  records available in epic.  Labs on admission; CBC of 12.8, hemoglobin 12.9 hematocrit 39.3, MCV of 94 BUN 40, creatinine 1.18, LFTs within normal limits, INR 0.97, she is heme positive  Hgb  was also done on 07/27/2017 hemoglobin 12.9 at that time as well.   Past Medical History:  Diagnosis Date  . Allergy   . Anxiety   . Arthritis    "knees, back" (06/29/2014)  . Chronic bronchitis ([place])    "get it q yr"  . Chronic mid back pain   . Cognitive impairment 12/11/2013  . Complication of anesthesia 07/2008   "hard to get me woke up when I had my knee replaced; they said they had to bring me back"  . Esophageal stricture   . GERD (gastroesophageal reflux disease)   . History of gout 1970's  . Hyperlipidemia   . Hypertension   . Movement disorder   . Osteoarthritis   . Parkinson's disease ([place])   . Scoliosis   . Situational depression   . Type II diabetes mellitus ([place])   . UTI (lower urinary tract infection)     Past Surgical History:  Procedure Laterality Date  . ABDOMINAL HYSTERECTOMY    . CHOLECYSTECTOMY OPEN    . COLON SURGERY    . DILATION AND CURETTAGE OF UTERUS    . ESOPHAGOGASTRIC FUNDOPLASTY     some type "esoph surgery" per pt  . JOINT REPLACEMENT    . TOTAL KNEE ARTHROPLASTY Right 07/2008  . TUBAL LIGATION      Prior to Admission medications  Medication Sig Start Date End Date Taking? Authorizing Provider  carbidopa-levodopa (SINEMET IR) 25-100 MG tablet Take 1 tablet by mouth 5 (five) times daily. 07/27/17  Yes Penumalli, Earlean Polka, MD  gabapentin (NEURONTIN) 300 MG capsule Take 300-600 mg by mouth See admin instructions. Take  2 capsules in the morning and 1 capsule in the evening as needed 07/10/16  Yes Plovsky, Berneta Sages, MD  olmesartan (BENICAR) 5 MG tablet Take 2 tablets (10 mg total) by mouth daily. 07/29/17  Yes [name], MD  omeprazole (PRILOSEC) 40 MG capsule TAKE 1 CAPSULE BY MOUTH EVERY DAY 09/29/16  Yes [name], MD  propranolol ER (INDERAL LA) 80  MG 24 hr capsule Take 1 capsule (80 mg total) by mouth daily. 07/29/17  Yes [name], MD  sertraline (ZOLOFT) 100 MG tablet Take 100 mg by mouth daily. Per psych 12/29/13  Yes [provider]  traMADol (ULTRAM) 50 MG tablet TAKE 1 TO 2 TABLETS BY MOUTH TWICE DAILY AS NEEDED FOR PAIN 07/30/17  Yes [name], MD  Blood Glucose Monitoring Suppl (ACCU-CHEK AVIVA PLUS) w/Device KIT Use to check blood sugar daily. ICD-10 code: E11.9 09/29/16   [name], MD  ONE [facility] ULTRA TEST test strip USE AS DIRECTED 09/04/14   [name], MD  [facility] LANCETS 78L MISC Daily for Glucose Testing 11/28/14   [name], MD    Current Facility-Administered Medications  Medication Dose Route Frequency Provider Last Rate Last Dose  . carbidopa-levodopa (SINEMET IR) 25-100 MG per tablet immediate release 1 tablet  1 tablet Oral 5 X Daily Everrett Coombe, MD      . pantoprazole (PROTONIX) 80 mg in sodium chloride 0.9 % 250 mL (0.32 mg/mL) infusion  8 mg/hr Intravenous Continuous Antonietta Breach, PA-C 25 mL/hr at 08/04/17 0635 8 mg/hr at 08/04/17 0635  . propranolol ER (INDERAL LA) 24 hr capsule 80 mg  80 mg Oral Daily Everrett Coombe, MD      . sertraline (ZOLOFT) tablet 100 mg  100 mg Oral Daily Everrett Coombe, MD       Current Outpatient Medications  Medication Sig Dispense Refill  . carbidopa-levodopa (SINEMET IR) 25-100 MG tablet Take 1 tablet by mouth 5 (five) times daily. 150 tablet 12  . gabapentin (NEURONTIN) 300 MG capsule Take 300-600 mg by mouth See admin instructions. Take  2 capsules in the morning and 1 capsule in the evening as needed    . olmesartan (BENICAR) 5 MG tablet Take 2 tablets (10 mg total) by mouth daily. 180 tablet 3  . omeprazole (PRILOSEC) 40 MG capsule TAKE 1 CAPSULE BY MOUTH EVERY DAY 90 capsule 3  . propranolol ER (INDERAL LA) 80 MG 24 hr capsule Take 1 capsule (80 mg total) by mouth daily. 90 capsule 3  . sertraline (ZOLOFT) 100 MG tablet Take 100 mg by mouth daily. Per  psych    . traMADol (ULTRAM) 50 MG tablet TAKE 1 TO 2 TABLETS BY MOUTH TWICE DAILY AS NEEDED FOR PAIN 120 tablet 0  . Blood Glucose Monitoring Suppl (ACCU-CHEK AVIVA PLUS) w/Device KIT Use to check blood sugar daily. ICD-10 code: E11.9 1 kit 0  . ONE TOUCH ULTRA TEST test strip USE AS DIRECTED 100 each 12  . ONETOUCH DELICA LANCETS 38B MISC Daily for Glucose Testing 300 each 1    Allergies as of 08/04/2017 - Review Complete 08/04/2017  Allergen Reaction Noted  . Ace inhibitors Anaphylaxis and Swelling 03/11/2012  . Azilect [  rasagiline mesylate] Other (See Comments) 09/26/2010  . Lisinopril Swelling 05/21/2005    Family History  Problem Relation Age of Onset  . Heart disease Mother   . Diabetes Mother   . Heart disease Father   . Heart attack Sister   . Heart attack Brother   . Cerebral palsy Son     Social History   Socioeconomic History  . Marital status: Married    Spouse name: [name]  . Number of children: 2  . Years of education: 48  . Highest education level: Not on file  Occupational History  . Occupation: Retired    Fish farm manager: NOT EMPLOYED  Social Needs  . Financial resource strain: Not on file  . Food insecurity:    Worry: Not on file    Inability: Not on file  . Transportation needs:    Medical: Not on file    Non-medical: Not on file  Tobacco Use  . Smoking status: Never Smoker  . Smokeless tobacco: Never Used  Substance and Sexual Activity  . Alcohol use: No  . Drug use: No  . Sexual activity: Not on file  Lifestyle  . Physical activity:    Days per week: Not on file    Minutes per session: Not on file  . Stress: Not on file  Relationships  . Social connections:    Talks on phone: Not on file    Gets together: Not on file    Attends religious service: Not on file    Active member of club or organization: Not on file    Attends meetings of clubs or organizations: Not on file    Relationship status: Not on file  . Intimate partner violence:     Fear of current or ex partner: Not on file    Emotionally abused: Not on file    Physically abused: Not on file    Forced sexual activity: Not on file  Other Topics Concern  . Not on file  Social History Narrative   Health Care POA:    Emergency Contact: daughter, [name], [phone] husband, [name] [phone]   End of Life Plan:   Who lives with you: Lives with husband and son, [name]   Any pets: Rabbit, [name]   Diet: Patient has a varied diet but struggles with what to eat with hypertension, diabetes, parkinsons   Exercise: Patient does not have any regular exercise routine.   Seatbelts: Patient reports wearing her seatbelt when in vehicle.   Nancy Fetter Exposure/Protection: Patient reports wearing sun block lotion daily.   Hobbies: Watching game shows, writing poetry, writing in journal, volunteering at day program with son.    Caffeine Use: very little occasional          Review of Systems: Pertinent positive and negative review of systems were noted in the above HPI section.  All other review of systems was otherwise negative.  Physical Exam: Vital signs in last 24 hours: Temp:  [97.8 F (36.6 C)] 97.8 F (36.6 C) (04/23 0441) Pulse Rate:  [88-120] 100 (04/23 0830) Resp:  [14-20] 14 (04/23 0830) BP: (102-145)/(73-86) 144/86 (04/23 0830) SpO2:  [90 %-100 %] 98 % (04/23 0830) Weight:  [169 lb (76.7 kg)] 169 lb (76.7 kg) (04/23 0442)   General:   Alert,  Well-developed, well-nourished, older white female pleasant and cooperative in NAD.  Husband at bedside Head:  Normocephalic and atraumatic. Eyes:  Sclera clear, no icterus.   Conjunctiva pink. Ears:  Normal auditory acuity. Nose:  No deformity,  discharge,  or lesions. Mouth:  No deformity or lesions.   Neck:  Supple; no masses or thyromegaly. Lungs:  Clear throughout to auscultation.   No wheezes, crackles, or rhonchi. Heart:  Regular rate and rhythm; no murmurs, clicks, rubs,  or gallops. Abdomen:  Soft,nontender, BS  active,nonpalp mass or hsm.   Rectal:  Deferred, documented heme positive Msk:  Symmetrical without gross deformities. . Pulses:  Normal pulses noted. Extremities:  Without clubbing or edema. Neurologic:  Alert and  oriented x4; non-intention tremor Skin:  Intact without significant lesions or rashes.. Psych:  Alert and cooperative. Normal mood and affect.  Intake/Output from previous day: 04/22 0701 - 04/23 0700 In: 100 [IV Piggyback:100] Out: -  Intake/Output this shift: Total I/O In: 2000 [IV Piggyback:2000] Out: -   Lab Results: Recent Labs    08/04/17 0447  WBC 12.8*  HGB 12.9  HCT 39.3  PLT 451*   BMET Recent Labs    08/04/17 0447  NA 140  K 3.7  CL 103  CO2 26  GLUCOSE 195*  BUN 40*  CREATININE 1.18*  CALCIUM 10.2   LFT Recent Labs    08/04/17 0447  PROT 6.3*  ALBUMIN 3.6  AST 17  ALT 14  ALKPHOS 72  BILITOT 1.1   PT/INR Recent Labs    08/04/17 0602  LABPROT 12.8  INR 0.97   Hepatitis Panel No results for input(s): HEPBSAG, HCVAB, HEPAIGM, HEPBIGM in the last 72 hours.     IMPRESSION:  #38 72 year old white female with 3-day history of intermittent nausea and vomiting with coffee-ground emesis.  Patient had increase in eating last evening at home with passage of some clots in addition to black material. Patient with history of chronic GERD, and by history previous esophageal stricture requiring remote dilation. Patient states she has been having problems with solid food dysphagia over the past several months somewhat progressive. Suspect recurrent esophageal stricture and bleeding secondary to ulcerative esophagitis versus Mallory-Weiss tear versus less likely esophageal neoplasm  She has been hemodynamically stable and hemoglobin stable over the past week.  #2 Parkinson's disease #3 mild cognitive impairment #4 hypertension #5 osteo-arthritis with chronic pain #6 hypertension #7 diverticulosis by history-colon cancer  surveillance-last colonoscopy 10+ years ago  Plan; Clear liquids today, n.p.o. after midnight IV PPI twice daily Elevate head of bed Patient has been scheduled for upper endoscopy with possible esophageal dilation with Dr. Hilarie Fredrickson in a.m. tomorrow.  Procedure was discussed in detail with the patient including indications risks and benefits and she is agreeable to proceed Serial hemoglobins, and transfuse as indicated Patient should have colonoscopy at some point, this can be done as an outpatient.   Evett Kassa  08/04/2017, 10:05 AM

## 2017-08-04 NOTE — ED Notes (Signed)
Pt given ice water.

## 2017-08-04 NOTE — ED Triage Notes (Signed)
C/o vomiting  Dark emesis 2 days ago , states this am she vomited this am, patient is very anxious. Yelling at family

## 2017-08-04 NOTE — ED Notes (Signed)
Meal Tray ordered at 16:07

## 2017-08-05 ENCOUNTER — Other Ambulatory Visit: Payer: Self-pay

## 2017-08-05 ENCOUNTER — Encounter (HOSPITAL_COMMUNITY)
Admission: EM | Disposition: A | Discharge status: Home Health | Payer: Self-pay | Source: Home / Self Care | Attending: Family Medicine

## 2017-08-05 ENCOUNTER — Inpatient Hospital Stay (HOSPITAL_COMMUNITY): Payer: Medicare HMO | Admitting: Anesthesiology

## 2017-08-05 ENCOUNTER — Encounter (HOSPITAL_COMMUNITY): Payer: Self-pay | Admitting: *Deleted

## 2017-08-05 DIAGNOSIS — K210 Gastro-esophageal reflux disease with esophagitis: Secondary | ICD-10-CM

## 2017-08-05 DIAGNOSIS — K449 Diaphragmatic hernia without obstruction or gangrene: Secondary | ICD-10-CM

## 2017-08-05 DIAGNOSIS — K209 Esophagitis, unspecified without bleeding: Secondary | ICD-10-CM

## 2017-08-05 HISTORY — PX: ESOPHAGOGASTRODUODENOSCOPY (EGD) WITH PROPOFOL: SHX5813

## 2017-08-05 LAB — CBC
HCT: 29.1 % — ABNORMAL LOW (ref 36.0–46.0)
HCT: 28.5 % — ABNORMAL LOW (ref 36.0–46.0)
Hemoglobin: 9.5 g/dL — ABNORMAL LOW (ref 12.0–15.0)
Hemoglobin: 9.3 g/dL — ABNORMAL LOW (ref 12.0–15.0)
MCH: 31.1 pg (ref 26.0–34.0)
MCH: 31 pg (ref 26.0–34.0)
MCHC: 32.6 g/dL (ref 30.0–36.0)
MCHC: 32.6 g/dL (ref 30.0–36.0)
MCV: 95.4 fL (ref 78.0–100.0)
MCV: 95 fL (ref 78.0–100.0)
Platelets: 275 10*3/uL (ref 150–400)
Platelets: 264 10*3/uL (ref 150–400)
RBC: 3.05 MIL/uL — ABNORMAL LOW (ref 3.87–5.11)
RBC: 3 MIL/uL — ABNORMAL LOW (ref 3.87–5.11)
RDW: 14.4 % (ref 11.5–15.5)
RDW: 14.4 % (ref 11.5–15.5)
WBC: 8.5 10*3/uL (ref 4.0–10.5)
WBC: 7.6 10*3/uL (ref 4.0–10.5)

## 2017-08-05 LAB — BASIC METABOLIC PANEL
Anion gap: 8 (ref 5–15)
BUN: 24 mg/dL — ABNORMAL HIGH (ref 6–20)
CO2: 26 mmol/L (ref 22–32)
Calcium: 8.4 mg/dL — ABNORMAL LOW (ref 8.9–10.3)
Chloride: 110 mmol/L (ref 101–111)
Creatinine, Ser: 1.16 mg/dL — ABNORMAL HIGH (ref 0.44–1.00)
GFR calc Af Amer: 53 mL/min — ABNORMAL LOW (ref >60)
GFR calc non Af Amer: 46 mL/min — ABNORMAL LOW (ref >60)
Glucose, Bld: 98 mg/dL (ref 65–99)
Potassium: 3.9 mmol/L (ref 3.5–5.1)
Sodium: 144 mmol/L (ref 135–145)

## 2017-08-05 LAB — TYPE AND SCREEN
ABO/RH(D): O POS
Antibody Screen: NEGATIVE

## 2017-08-05 LAB — PROTIME-INR
INR: 1.04
Prothrombin Time: 13.6 seconds (ref 11.4–15.2)

## 2017-08-05 SURGERY — ESOPHAGOGASTRODUODENOSCOPY (EGD) WITH PROPOFOL
Anesthesia: Monitor Anesthesia Care

## 2017-08-05 MED ORDER — PANTOPRAZOLE SODIUM 40 MG PO TBEC
40.0000 mg | DELAYED_RELEASE_TABLET | Freq: Two times a day (BID) | ORAL | Status: DC
  Administered 2017-08-05 – 2017-08-07 (×4): 40 mg via ORAL
  Filled 2017-08-05 (×4): qty 1

## 2017-08-05 MED ORDER — LACTATED RINGERS IV SOLN
INTRAVENOUS | Status: DC
  Administered 2017-08-05: 11:00:00 via INTRAVENOUS

## 2017-08-05 MED ORDER — PROPOFOL 10 MG/ML IV BOLUS
INTRAVENOUS | Status: DC | PRN
  Administered 2017-08-05: 40 mg via INTRAVENOUS
  Administered 2017-08-05: 70 mg via INTRAVENOUS
  Administered 2017-08-05: 30 mg via INTRAVENOUS

## 2017-08-05 MED ORDER — LIDOCAINE 2% (20 MG/ML) 5 ML SYRINGE
INTRAMUSCULAR | Status: DC | PRN
  Administered 2017-08-05: 30 mg via INTRAVENOUS

## 2017-08-05 MED ORDER — EPHEDRINE SULFATE-NACL 50-0.9 MG/10ML-% IV SOSY
PREFILLED_SYRINGE | INTRAVENOUS | Status: DC | PRN
  Administered 2017-08-05: 10 mg via INTRAVENOUS
  Administered 2017-08-05: 5 mg via INTRAVENOUS

## 2017-08-05 MED ORDER — LACTATED RINGERS IV SOLN
INTRAVENOUS | Status: DC | PRN
  Administered 2017-08-05: 10:00:00 via INTRAVENOUS

## 2017-08-05 MED ORDER — SUCRALFATE 1 GM/10ML PO SUSP: 1.0000 g | Freq: Three times a day (TID) | ORAL | Status: DC

## 2017-08-05 MED ORDER — SUCRALFATE 1 GM/10ML PO SUSP
1.0000 g | Freq: Three times a day (TID) | ORAL | Status: DC
  Administered 2017-08-05 – 2017-08-07 (×9): 1 g via ORAL
  Filled 2017-08-05 (×9): qty 10

## 2017-08-05 SURGICAL SUPPLY — 15 items

## 2017-08-05 NOTE — Anesthesia Procedure Notes (Signed)
Procedure Name: MAC Date/Time: 08/05/2017 11:30 AM Performed by: Orlie Dakin, CRNA Pre-anesthesia Checklist: Patient identified, Emergency Drugs available, Suction available, Patient being monitored and Timeout performed Patient Re-evaluated:Patient Re-evaluated prior to induction Oxygen Delivery Method: Nasal cannula Preoxygenation: Pre-oxygenation with 100% oxygen Induction Type: IV induction

## 2017-08-05 NOTE — Patient Outreach (Signed)
[facility]) Care Management  08/05/2017  NYAJA DUBUQUE Sep 25, 1945 [id number]  Care Coordination:   Referral received from patients primary MD for [facility] consult.  Upon chart review found patient to be admitted to the hospital.  RNCM notified [facility] hospital liaisons of admission and requested patient be followed for discharge disposition.   Plan; Will await notification from hospital on patients discharge status.   Quinn Plowman RN,BSN,CCM [facility] Telephonic  [phone]

## 2017-08-05 NOTE — Progress Notes (Addendum)
Family Medicine Teaching Service Daily Progress Note Intern Pager: [phone]  [name] Medical record number: [id number] Date of birth: 1945/07/17 Age: 72 y.o. Gender: female  Primary Care Provider: [name], MD Consultants: GI Code Status: full  Pt Overview and Major Events to Date:  Admitted 4/23  Assessment and Plan:  [name] is a 72 y.o. female presenting with hematemesis . PMH is significant for parkinsons disease, HTN, MDD, CKD 3, OA/chronic pain  Hematemesis: Patient continues to have bloody emesis and bowel movements, although she says the amount of blood has decreased.  Hgb has decreased from 12.8 to 9.5 on 4/24.  GI plans to perform EGD with possible esophageal dilatation on 4/24. - monitor on telemetry - zofran PRN nausea - continue PPI 8 mg/hr started in ED - appreciate GI recommendations - continue propanolol 80 mg daily - NPO in preparation for EDG  - repeat CBC on 4/24 at 1500  HTN -  Controlled on admission with BP 111/64. Olmesartan 10 mg daily as home med. - continue to home home olmesartan  MDD - Home med sertraline (zoloft) 100 mg daily.  - continue home Zoloft  OA/chronic pain syndrome - home medications gabapentin 600 mg qAM, 300 mg qPM, Tramadol 50-100 mg BID PRN pain. - hold pain medications for now unless patient requests  Parkinson's disease - Home medication carbidopa-levodopa 1 tab 5 times daily.  CKD 3 - Cr 1.18 on admission, appears to be at baseline. Creatinine 1.16, BUN 40>24 on 4/24. - monitor BMP - avoid nephrotoxic agents  FEN/GI: NPO for EGD on 4/24 PPx: SCDs  Disposition: continue on telemetry  Subjective:  Patient says she has felt fine except for nausea, but zofran helps.  Is ready for her EGD today.  Objective: Temp:  [97.6 F (36.4 C)-98.3 F (36.8 C)] 98.3 F (36.8 C) (04/24 0531) Pulse Rate:  [62-108] 62 (04/24 0531) Resp:  [14-23] 16 (04/24 0531) BP: (96-159)/(53-93) 111/64  (04/24 0531) SpO2:  [97 %-100 %] 97 % (04/24 0531) Physical Exam: General: lying comfortably in bed, has resting tremor and oropharyngeal tremor d/t parkinson's Cardiovascular: RRR, no MRG Respiratory: CTAB Abdomen: soft, nontender Extremities: moves all spontaneously  Laboratory: Recent Labs  Lab 08/04/17 0447 08/05/17 0257  WBC 12.8* 8.5  HGB 12.9 9.5*  HCT 39.3 29.1*  PLT 451* 275   Recent Labs  Lab 08/04/17 0447 08/05/17 0257  NA 140 144  K 3.7 3.9  CL 103 110  CO2 26 26  BUN 40* 24*  CREATININE 1.18* 1.16*  CALCIUM 10.2 8.4*  PROT 6.3*  --   BILITOT 1.1  --   ALKPHOS 72  --   ALT 14  --   AST 17  --   GLUCOSE 195* 98     Imaging/Diagnostic Tests: No results found.   Kathrene Alu, MD 08/05/2017, 7:31 AM PGY-1, [place] Intern pager: [phone], text pages welcome

## 2017-08-05 NOTE — Telephone Encounter (Signed)
PA for olmesartan approved through 08/06/18. [facility] notified. Wallace Cullens, RN

## 2017-08-05 NOTE — Interval H&P Note (Signed)
History and Physical Interval Note: For EGD today No further coffeeground emesis. Hgb has declined to 9.5 today. The nature of the procedure, as well as the risks, benefits, and alternatives were carefully and thoroughly reviewed with the patient. Ample time for discussion and questions allowed. The patient understood, was satisfied, and agreed to proceed.   CBC Latest Ref Rng & Units 08/05/2017 08/04/2017 07/27/2017  WBC 4.0 - 10.5 K/uL 8.5 12.8(H) 7.3  Hemoglobin 12.0 - 15.0 g/dL 9.5(L) 12.9 12.9  Hematocrit 36.0 - 46.0 % 29.1(L) 39.3 38.0  Platelets 150 - 400 K/uL 275 451(H) 307   Lab Results  Component Value Date   INR 1.04 08/05/2017   INR 0.97 08/04/2017   INR 0.97 12/10/2013      08/05/2017 11:08 AM  [name]  has presented today for surgery, with the diagnosis of coffee ground emesis  The various methods of treatment have been discussed with the patient and family. After consideration of risks, benefits and other options for treatment, the patient has consented to  Procedure(s): ESOPHAGOGASTRODUODENOSCOPY (EGD) WITH PROPOFOL (N/A) as a surgical intervention .  The patient's history has been reviewed, patient examined, no change in status, stable for surgery.  I have reviewed the patient's chart and labs.  Questions were answered to the patient's satisfaction.     Lajuan Lines Kynzlie Hilleary

## 2017-08-05 NOTE — Anesthesia Preprocedure Evaluation (Addendum)
Anesthesia Evaluation  Patient identified by MRN, date of birth, ID band Patient awake    Reviewed: Allergy & Precautions, NPO status , Patient's Chart, lab work & pertinent test results, reviewed documented beta blocker date and time   Airway Mallampati: III  TM Distance: >3 FB Neck ROM: Full    Dental  (+) Dental Advisory Given   Pulmonary COPD,    breath sounds clear to auscultation       Cardiovascular hypertension, Pt. on medications and Pt. on home beta blockers  Rhythm:Regular Rate:Normal     Neuro/Psych Anxiety Depression Parkinson's disease  Neuromuscular disease negative psych ROS   GI/Hepatic Neg liver ROS, GERD  Controlled and Medicated,Esophageal stricture   Endo/Other  negative endocrine ROSdiabetes, Well Controlled, Type 2  Renal/GU CRFRenal disease  negative genitourinary   Musculoskeletal  (+) Arthritis , Osteoarthritis,  Scoliosis   Abdominal   Peds  Hematology  (+) anemia ,   Anesthesia Other Findings   Reproductive/Obstetrics                            Anesthesia Physical Anesthesia Plan  ASA: III  Anesthesia Plan: MAC   Post-op Pain Management:    Induction: Intravenous  PONV Risk Score and Plan: Propofol infusion and Treatment may vary due to age or medical condition  Airway Management Planned: Nasal Cannula and Natural Airway  Additional Equipment: None  Intra-op Plan:   Post-operative Plan:   Informed Consent: I have reviewed the patients History and Physical, chart, labs and discussed the procedure including the risks, benefits and alternatives for the proposed anesthesia with the patient or authorized representative who has indicated his/her understanding and acceptance.     Plan Discussed with: CRNA and Anesthesiologist  Anesthesia Plan Comments:         Anesthesia Quick Evaluation

## 2017-08-05 NOTE — Transfer of Care (Signed)
Immediate Anesthesia Transfer of Care Note  Patient: [name]  Procedure(s) Performed: ESOPHAGOGASTRODUODENOSCOPY (EGD) WITH PROPOFOL (N/A )  Patient Location: Endoscopy Unit  Anesthesia Type:MAC  Level of Consciousness: awake, oriented and patient cooperative  Airway & Oxygen Therapy: Patient Spontanous Breathing and Patient connected to nasal cannula oxygen  Post-op Assessment: Report given to RN and Post -op Vital signs reviewed and stable  Post vital signs: Reviewed and stable  Last Vitals:  Vitals Value Taken Time  BP 89/46 08/05/2017 11:46 AM  Temp    Pulse 63 08/05/2017 11:47 AM  Resp 20 08/05/2017 11:47 AM  SpO2 100 % 08/05/2017 11:47 AM  Vitals shown include unvalidated device data.  Last Pain:  Vitals:   08/05/17 1033  TempSrc: Oral  PainSc: 0-No pain         Complications: No apparent anesthesia complications

## 2017-08-05 NOTE — Anesthesia Postprocedure Evaluation (Signed)
Anesthesia Post Note  Patient: [name]  Procedure(s) Performed: ESOPHAGOGASTRODUODENOSCOPY (EGD) WITH PROPOFOL (N/A )     Patient location during evaluation: PACU Anesthesia Type: MAC Level of consciousness: awake and alert Pain management: pain level controlled Vital Signs Assessment: post-procedure vital signs reviewed and stable Respiratory status: spontaneous breathing, nonlabored ventilation and respiratory function stable Cardiovascular status: stable and blood pressure returned to baseline Anesthetic complications: no    Last Vitals:  Vitals:   08/05/17 1214 08/05/17 1229  BP: (!) 110/52 92/70  Pulse: 64 61  Resp: 18 18  Temp:  37 C  SpO2: 100% 97%    Last Pain:  Vitals:   08/05/17 1229  TempSrc: Oral  PainSc:                  [name]

## 2017-08-05 NOTE — Op Note (Signed)
[facility] [name] Procedure Date : 08/05/2017 MRN: [id number] Attending MD: Jerene Bears , MD Date of Birth: 20-Mar-1946 CSN: [id number] Age: 72 Admit Type: Inpatient Procedure:                Upper GI endoscopy Indications:              Acute post hemorrhagic anemia, Coffee-ground                            emesis, Melena Providers:                Lajuan Lines. Hilarie Fredrickson, MD, Cleda Daub, RN, Alan Mulder, Technician Referring MD:             [facility] Medicines:                Monitored Anesthesia Care Complications:            No immediate complications. Estimated Blood Loss:     Estimated blood loss: none. Procedure:                Pre-Anesthesia Assessment:                           - Prior to the procedure, a History and Physical                            was performed, and patient medications and                            allergies were reviewed. The patient's tolerance of                            previous anesthesia was also reviewed. The risks                            and benefits of the procedure and the sedation                            options and risks were discussed with the patient.                            All questions were answered, and informed consent                            was obtained. Prior Anticoagulants: The patient has                            taken no previous anticoagulant or antiplatelet                            agents. ASA Grade Assessment: III - A patient with  severe systemic disease. After reviewing the risks                            and benefits, the patient was deemed in                            satisfactory condition to undergo the procedure.                           After obtaining informed consent, the endoscope was                            passed under direct vision. Throughout the                            procedure, the  patient's blood pressure, pulse, and                            oxygen saturations were monitored continuously. The                            EG-[id number] ([id number]) scope was introduced through the                            mouth, and advanced to the second part of duodenum.                            The upper GI endoscopy was accomplished without                            difficulty. The patient tolerated the procedure                            well. Scope In: Scope Out: Findings:      Severe ulcerative esophagitis with no bleeding was found 36 to 40 cm       from the incisors. There is circumferential esophagitis with a large,       1-1.5 cm clean-based ulceration in the distal esophagus.      A 4 cm hiatal hernia was present.      The entire examined stomach was normal.      The examined duodenum was normal. Impression:               - Severe ulcerative reflux esophagitis. Source of                            coffee-ground emesis and recent upper GI bleeding.                           - 4 cm hiatal hernia.                           - Normal stomach.                           -  Normal examined duodenum.                           - No specimens collected. Moderate Sedation:      N/A Recommendation:           - Return patient to hospital ward for ongoing care.                           - Advance diet as tolerated.                           - Continue present medications.                           - Twice daily oral PPI for 12 weeks; liquid                            sucralfate 1 g before meals and at bedtime for 1-2                            weeks.                           - Reflux precautions, remain upper right after                            meals for at least 90 min.                           - Repeat upper endoscopy in 12 weeks to check                            healing. Procedure Code(s):        --- Professional ---                           [phone],  Esophagogastroduodenoscopy, flexible,                            transoral; diagnostic, including collection of                            specimen(s) by brushing or washing, when performed                            (separate procedure) Diagnosis Code(s):        --- Professional ---                           K21.0, Gastro-esophageal reflux disease with                            esophagitis                           K44.9, Diaphragmatic hernia without obstruction or  gangrene                           D62, Acute posthemorrhagic anemia                           K92.0, Hematemesis                           K92.1, Melena (includes Hematochezia) CPT copyright 2017 American Medical Association. All rights reserved. The codes documented in this report are preliminary and upon coder review may  be revised to meet current compliance requirements. Jerene Bears, MD 08/05/2017 12:02:49 PM This report has been signed electronically. Number of Addenda: 0

## 2017-08-06 ENCOUNTER — Encounter: Payer: Self-pay | Admitting: Internal Medicine

## 2017-08-06 ENCOUNTER — Encounter (HOSPITAL_COMMUNITY): Payer: Self-pay | Admitting: General Practice

## 2017-08-06 ENCOUNTER — Other Ambulatory Visit: Payer: Self-pay

## 2017-08-06 LAB — URINALYSIS, COMPLETE (UACMP) WITH MICROSCOPIC
Bilirubin Urine: NEGATIVE
Glucose, UA: NEGATIVE mg/dL
Hgb urine dipstick: NEGATIVE
Ketones, ur: 5 mg/dL — AB
Leukocytes, UA: NEGATIVE
Nitrite: POSITIVE — AB
Protein, ur: NEGATIVE mg/dL
Specific Gravity, Urine: 1.017 (ref 1.005–1.030)
pH: 5 (ref 5.0–8.0)

## 2017-08-06 LAB — CBC
HCT: 28 % — ABNORMAL LOW (ref 36.0–46.0)
HCT: 29.1 % — ABNORMAL LOW (ref 36.0–46.0)
Hemoglobin: 9.1 g/dL — ABNORMAL LOW (ref 12.0–15.0)
Hemoglobin: 9.6 g/dL — ABNORMAL LOW (ref 12.0–15.0)
MCH: 31 pg (ref 26.0–34.0)
MCH: 31.3 pg (ref 26.0–34.0)
MCHC: 32.5 g/dL (ref 30.0–36.0)
MCHC: 33 g/dL (ref 30.0–36.0)
MCV: 95.2 fL (ref 78.0–100.0)
MCV: 94.8 fL (ref 78.0–100.0)
Platelets: 236 K/uL (ref 150–400)
Platelets: 265 K/uL (ref 150–400)
RBC: 2.94 MIL/uL — ABNORMAL LOW (ref 3.87–5.11)
RBC: 3.07 MIL/uL — ABNORMAL LOW (ref 3.87–5.11)
RDW: 14.1 % (ref 11.5–15.5)
RDW: 14.3 % (ref 11.5–15.5)
WBC: 5.9 K/uL (ref 4.0–10.5)
WBC: 7.6 K/uL (ref 4.0–10.5)

## 2017-08-06 LAB — PROTIME-INR
INR: 0.95
Prothrombin Time: 12.6 s (ref 11.4–15.2)

## 2017-08-06 MED ORDER — SODIUM CHLORIDE 0.9 % IV BOLUS
500.0000 mL | Freq: Once | INTRAVENOUS | Status: AC
  Administered 2017-08-06: 500 mL via INTRAVENOUS

## 2017-08-06 MED ORDER — SUCRALFATE 1 GM/10ML PO SUSP: 1.0000 g | Freq: Three times a day (TID) | ORAL | 0 refills | Status: DC

## 2017-08-06 MED ORDER — CEPHALEXIN 500 MG PO CAPS
500.0000 mg | ORAL_CAPSULE | Freq: Two times a day (BID) | ORAL | Status: DC
  Administered 2017-08-06: 500 mg via ORAL
  Filled 2017-08-06 (×2): qty 1

## 2017-08-06 MED ORDER — OMEPRAZOLE 40 MG PO CPDR: DELAYED_RELEASE_CAPSULE | ORAL | 3 refills | Status: DC

## 2017-08-06 NOTE — Consult Note (Addendum)
   [facility] CM Inpatient Consult   08/06/2017  DAIL MEECE October 25, 1945 [id number]     Made aware of hospitalization by [place].   Went to bedside to speak with Mrs. Fuller about [place] Management program. She was active with [place] [facility] in the past as well. Asked that writer contact her daughter [name] at [phone] to discuss [place] Management services.  Mrs. Warbington and daughter agreeable to [place] [facility] but wants  Telephonic RNCM follow up not [facility] RNCM at this time. Agreeable to [place] referral as well. Mrs. Stenberg endorses she "has gotten mixed up with her medications."  Mrs. Asch lives with husband. Primary Care MD is Dr. Nori Riis [facility] Medicine listed as doing transition of care call) Denies having issues with transportation.  Best contact information for Mrs. Spadafore at home [phone] and cell number at [phone]. [name] (daughter) [phone] states she can be called if Mrs. Tussey does not answer phone.  Written consent obtained and [facility] Care Management folder provided.  Daughter reports patient will have aide services thru social services for 8 hrs a week beginning early May.  Will updated [facility] Telephonic RNCM and make referral for  [facility] Pharmacist for medication management and med review.  Inpatient RNCM aware of bedside visit.    Marthenia Rolling, MSN-Ed, RN,BSN [facility] Liaison [phone]

## 2017-08-06 NOTE — Progress Notes (Addendum)
Family Medicine Teaching Service Daily Progress Note Intern Pager: [phone]  [name] Medical record number: [id number] Date of birth: 23-Jan-1946 Age: 72 y.o. Gender: female  Primary Care Provider: [name], MD Consultants: GI Code Status: full  Pt Overview and Major Events to Date:  Admitted 4/23  Assessment and Plan:  [name] is a 72 y.o. female presenting with hematemesis . PMH is significant for parkinsons disease, HTN, MDD, CKD 3, OA/chronic pain  Hematemesis: EGD on 4/24 significant for severe ulcerative reflux esophagitis, which was determined to be likely source of bleeding.  Patient tolerating diet.  Hgb has decreased from 12.8 at admission to 9.1 on 4/25.  - continue protonix 40 mg BID  - appreciate GI recommendations: GI recs: ADAT, PPI BID x 12 weeks, sucralfate 1g liquid AC and QHS x 1-2 weeks, upright after meals for 90 mins, repeat upper endoscopy in 12 weeks to check healing. - continue propanolol 80 mg daily - soft diet, ADAT  HTN -  Controlled on admission with BP 111/64, BP 124/92 on 4/25. Olmesartan 10 mg daily as home med. - continue on home olmesartan  MDD - Home med sertraline (zoloft) 100 mg daily.  - continue home Zoloft  OA/chronic pain syndrome - home medications gabapentin 600 mg qAM, 300 mg qPM, Tramadol 50-100 mg BID PRN pain. - hold pain medications for now unless patient requests  Parkinson's disease - Home medication carbidopa-levodopa 1 tab 5 times daily.  CKD 3 - Cr 1.18 on admission, appears to be at baseline. Creatinine 1.16, BUN 40>24 on 4/24. - avoid nephrotoxic agents  FEN/GI: soft diet PPx: SCDs  Disposition: continue on telemetry  Subjective:  Patient says she feels ready to go home today.  She has no pain or nausea.  Objective: Temp:  [97.7 F (36.5 C)-99.6 F (37.6 C)] 99.6 F (37.6 C) (04/24 2257) Pulse Rate:  [61-122] 70 (04/25 0641) Resp:  [12-21] 18 (04/24 2257) BP:  (89-136)/(39-111) 124/92 (04/25 0641) SpO2:  [92 %-100 %] 98 % (04/24 2257) Weight:  [169 lb (76.7 kg)] 169 lb (76.7 kg) (04/24 1033) Physical Exam: General: lying comfortably in bed, resting tremor d/t parkinson's Cardiovascular: RRR, no MRG Respiratory: CTAB Abdomen: soft, nontender Extremities: moves all spontaneously  Laboratory: Recent Labs  Lab 08/05/17 0257 08/05/17 1434 08/06/17 0448  WBC 8.5 7.6 5.9  HGB 9.5* 9.3* 9.1*  HCT 29.1* 28.5* 28.0*  PLT 275 264 236   Recent Labs  Lab 08/04/17 0447 08/05/17 0257  NA 140 144  K 3.7 3.9  CL 103 110  CO2 26 26  BUN 40* 24*  CREATININE 1.18* 1.16*  CALCIUM 10.2 8.4*  PROT 6.3*  --   BILITOT 1.1  --   ALKPHOS 72  --   ALT 14  --   AST 17  --   GLUCOSE 195* 98     Imaging/Diagnostic Tests: No results found.   Kathrene Alu, MD 08/06/2017, 7:28 AM PGY-1, [place] Intern pager: [phone], text pages welcome

## 2017-08-06 NOTE — Progress Notes (Signed)
PT Cancellation Note  Patient Details Name: [name] MRN: [id number] DOB: 12-26-1945   Cancelled Treatment:    Reason Eval/Treat Not Completed: Medical issues which prohibited therapy(Chart reviewed, RN conslted. RN asks that PT evlauation be held at this time as patient is hypotensive and ill feeling. Will follow acutely and attempt again at later date/time as pt is appropriate. )  1:51 PM, 08/06/17 Etta Grandchild, PT, DPT Physical Therapist - [place] [phone] (Pager)  [phone] (Office)      [name] C 08/06/2017, 1:51 PM

## 2017-08-06 NOTE — Discharge Instructions (Signed)
[name], you were started on one new medication called sucralfate, which you will take three times per day with meals and at night.  This medication will coat your esophagus to protect it from gastric acid.  Please continue this medication for the next 1-2 weeks.  We are also doubling your dose of omeprazole (Prilosec), so you will take one capsule twice per day now.  You will continue with this dose for 12 weeks.  After 12 weeks, you will get an EGD again to examine your esophagus.  The date and time of your appointments are listed in this paperwork.  Please follow up with Dr. Nori Riis at your appointment (the same time as your husband's appointment) next week, and with the GI doctor in June.

## 2017-08-06 NOTE — Progress Notes (Signed)
Patient blood pressue is low in the 80's over 50's. She said she noticed feeling dizzy when she stood up to get into the chair. I notified the MD of her BP and her symptoms. They were going to order a 517ml Bolus of fluids.

## 2017-08-06 NOTE — Care Management Note (Addendum)
Case Management Note  Patient Details  Name: [name] MRN: [id number] Date of Birth: 07/20/1945  Subjective/Objective:    Presented with hematemesis. PMH parkinsons disease, HTN, MDD, CKD 3, OA/chronic pain. From home with husband. Daughter, [name],  states lives within 25min  from parents. [name] states mom recently  was approved by SS( Ms Lovena Le, [phone]) for In home Elder Care, 8 hr/wk PCS. In [place] liaison hasn't initiated care 2/2 to pt being hospitalized.            Prestina Raigoza ([phone]      08/07/2017  @ 1030 Home health PT  referral made with [facility] pending MD's order by NCM. NCM has requested order from MD.  PCP: [name]  Action/Plan: PT eval. pending....NCM following for disposition needs.  Expected Discharge Date:                  Expected Discharge Plan:  Home/Self Care(Resides with husband)  In-House Referral:     Discharge planning Services  CM Consult  Post Acute Care Choice:    Choice offered to:   patient  DME Arranged:    DME Agency:     HH Arranged:   PT [place], select per pt if [facility] services are needed @ d/c.  Status of Service:  In process, will continue to follow  If discussed at Long Length of Stay Meetings, dates discussed:    Additional Comments:  Sharin Mons, RN 08/06/2017, 12:55 PM

## 2017-08-06 NOTE — Progress Notes (Signed)
Spoke with and examined [name] after being told that she developed dizziness and was found to have two blood pressures in the 80s/50s.  She reports that she was standing up from a seated position when she became dizzy.  She had not just used the restroom.  She denies nausea.  She says she is worried that a UTI may be contributing to her dizziness because she has a history of frequent UTIs.  She endorses increased frequency and hesitancy but no dysuria.  On exam, patient was alert, mentating well, and eating.  Concern that patient may have more blood loss leading to hypotension.  Will give 500 cc bolus, order stat CBC and PT/INR, and check a UA due to patient's concerns and reported symptoms.

## 2017-08-06 NOTE — Progress Notes (Signed)
PHARMACY NOTE:  ANTIMICROBIAL RENAL DOSAGE ADJUSTMENT  Current antimicrobial regimen includes a mismatch between antimicrobial dosage and estimated renal function.  As per policy approved by the Pharmacy & Therapeutics and Medical Executive Committees, the antimicrobial dosage will be adjusted accordingly.  Current antimicrobial dosage:  Keflex 250mg  po q6h  Indication: UTI  Renal Function:   Estimated Creatinine Clearance: 43.9 mL/min (A) (by C-G formula based on SCr of 1.16 mg/dL (H)).     Antimicrobial dosage has been changed to:  Keflex 500mg  po q12h  Additional comments:   Thank you for allowing pharmacy to be a part of this patient's care.  Dareen Piano, [facility] 08/06/2017 5:36 PM

## 2017-08-07 ENCOUNTER — Other Ambulatory Visit: Payer: Self-pay

## 2017-08-07 LAB — CBC
HCT: 29.4 % — ABNORMAL LOW (ref 36.0–46.0)
Hemoglobin: 9.7 g/dL — ABNORMAL LOW (ref 12.0–15.0)
MCH: 31.4 pg (ref 26.0–34.0)
MCHC: 33 g/dL (ref 30.0–36.0)
MCV: 95.1 fL (ref 78.0–100.0)
Platelets: 238 10*3/uL (ref 150–400)
RBC: 3.09 MIL/uL — ABNORMAL LOW (ref 3.87–5.11)
RDW: 14.3 % (ref 11.5–15.5)
WBC: 7.2 10*3/uL (ref 4.0–10.5)

## 2017-08-07 MED ORDER — CEPHALEXIN 500 MG PO CAPS: 500.0000 mg | ORAL_CAPSULE | Freq: Two times a day (BID) | ORAL | 0 refills | Status: DC

## 2017-08-07 MED ORDER — CEPHALEXIN 500 MG PO CAPS
500.0000 mg | ORAL_CAPSULE | Freq: Two times a day (BID) | ORAL | Status: DC
  Administered 2017-08-07: 500 mg via ORAL

## 2017-08-07 NOTE — Evaluation (Signed)
Physical Therapy Evaluation Patient Details Name: [name] MRN: [id number] DOB: 05/06/1945 Today's Date: 08/07/2017   History of Present Illness  [name] is a 72yo white female who comes to [facility] after multiple episodes of emesis, non since arrival. Pt noted to have esophogitis during upper GI scope aon 4/24, orthostasis on 4/25 and UTI. PMH: DM2, MDD, esophageal stricture, HLD, PD, HTN, GAD.   Clinical Impression  Pt admitted with above diagnosis. Pt currently with functional limitations due to the deficits listed below (see "PT Problem List"). Upon entry, the patient is received semirecumbent in bed, no family/caregiver present. The pt is awake and agreeable to participate. No acute distress noted at this time. Mild difficulty with simple commands. Tongue and oral motor function similar to typical tardive dyskinesia, unclear of etiology at this time. Orthostatic BP with standing, but not with supine to sitting. ModI bed mobility, supervision level transfers, and MinGuard Assist for AMB in room, limited by BP drop. Pt endorses acute strength loss. History of falls PTA, but only 1 in prior 3 months. Pt will benefit from skilled PT intervention to increase independence and safety with basic mobility in preparation for discharge to the venue listed below.       Follow Up Recommendations Home health PT;Supervision for mobility/OOB    Equipment Recommendations  None recommended by PT    Recommendations for Other Services       Precautions / Restrictions Precautions Precautions: Fall Restrictions Weight Bearing Restrictions: No      Mobility  Bed Mobility Overal bed mobility: Modified Independent                Transfers Overall transfer level: Needs assistance Equipment used: Rolling walker (2 wheeled) Transfers: Sit to/from Stand Sit to Stand: Supervision            Ambulation/Gait Ambulation/Gait assistance: Min guard Ambulation Distance (Feet): 12  Feet Assistive device: Rolling walker (2 wheeled)       General Gait Details: bed to chair only, pt orthostatic, feels week and fearful of falling;   Stairs            Wheelchair Mobility    Modified Rankin (Stroke Patients Only)       Balance Overall balance assessment: History of Falls;Mild deficits observed, not formally tested;Modified Independent                                           Pertinent Vitals/Pain Pain Assessment: 0-10 Pain Score: 6  Pain Location: chronic back pain, worse d/t time in bed Pain Descriptors / Indicators: Aching Pain Intervention(s): Limited activity within patient's tolerance;Monitored during session;Repositioned    Home Living Family/patient expects to be discharged to:: Private residence Living Arrangements: Spouse/significant other Available Help at Discharge: Family;Available 24 hours/day Type of Home: House Home Access: Stairs to enter Entrance Stairs-Rails: Can reach both Entrance Stairs-Number of Steps: 3 Home Layout: One level Home Equipment: Walker - 4 wheels;Shower seat(still sits in bath tub for most bathing) Additional Comments: History significant for infrequent falls, 4WW use mostly in home, does nto tolerate longer distances without sitting rests frequently.     Prior Function Level of Independence: Needs assistance   Gait / Transfers Assistance Needed: household AMB distances with 4WW, transfers modI  ADL's / Homemaking Assistance Needed: husband assists with dressing, bathing, sometimes toiletting; also helps with transportation, groceries, and meals.  Hand Dominance        Extremity/Trunk Assessment                Communication      Cognition Arousal/Alertness: Awake/alert Behavior During Therapy: WFL for tasks assessed/performed;Anxious Overall Cognitive Status: Within Functional Limits for tasks assessed                                        General  Comments      Exercises Other Exercises Other Exercises: Chair squats from EOB, elevated c RW: 2x5, 90sec rest between   Assessment/Plan    PT Assessment Patient needs continued PT services  PT Problem List Decreased strength;Decreased activity tolerance;Decreased mobility       PT Treatment Interventions Gait training;Stair training;DME instruction;Balance training;Functional mobility training;Therapeutic activities;Therapeutic exercise;Patient/family education    PT Goals (Current goals can be found in the Care Plan section)  Acute Rehab PT Goals Patient Stated Goal: return to home, regain strength  PT Goal Formulation: With patient Time For Goal Achievement: 08/21/17 Potential to Achieve Goals: Fair    Frequency Min 3X/week   Barriers to discharge        Co-evaluation               AM-PAC PT "6 Clicks" Daily Activity  Outcome Measure Difficulty turning over in bed (including adjusting bedclothes, sheets and blankets)?: None Difficulty moving from lying on back to sitting on the side of the bed? : None Difficulty sitting down on and standing up from a chair with arms (e.g., wheelchair, bedside commode, etc,.)?: A Little Help needed moving to and from a bed to chair (including a wheelchair)?: A Little Help needed walking in hospital room?: A Little Help needed climbing 3-5 steps with a railing? : A Lot 6 Click Score: 19    End of Session   Activity Tolerance: Patient tolerated treatment well;Other (comment)(pt anxious and hungry, dizzy with sustained standing) Patient left: in chair;with chair alarm set;with call bell/phone within reach;with family/visitor present(breakfast try presented) Nurse Communication: Mobility status PT Visit Diagnosis: Unsteadiness on feet (R26.81);Difficulty in walking, not elsewhere classified (R26.2);Other symptoms and signs involving the nervous system (R29.898)    Time: 4782-9562 PT Time Calculation (min) (ACUTE ONLY): 19  min   Charges:   PT Evaluation $PT Eval High Complexity: 1 High PT Treatments $Therapeutic Activity: 8-22 mins   PT G Codes:        9:40 AM, 14-Aug-2017 Etta Grandchild, PT, DPT Physical Therapist - [place] [phone] (Pager)  [phone] (Office)     Demontray Franta C 08/14/2017, 9:35 AM

## 2017-08-07 NOTE — Discharge Summary (Signed)
[facility] Discharge Summary  [name] Medical record number: [id number] Date of birth: 1946/03/28 Age: 72 y.o. Gender: female Date of Admission: 08/04/2017  Date of Discharge: 08/07/17 Admitting Physician: Leeanne Rio, MD  Primary Care Provider: [name], MD Consultants: GI  Indication for Hospitalization: hematemesis  Discharge Diagnoses/Problem List:  Severe ulcerative reflux esophagitis UTI HTN Major Depressive Disorder OA/Chronic pain syndrome Parkinson's disease CKD Stage 3  Disposition: home  Discharge Condition: stable, improved  Discharge Exam:  General: lying comfortably in bed, resting tremor d/t parkinson's, alert and talkative Cardiovascular: RRR, no MRG Respiratory: CTAB Abdomen: soft, nontender Extremities: moves all spontaneously  Brief Hospital Course:  Ms. Bubb was admitted on 08/04/17 for acute on chronic hematemesis.  FOBT was found to be positive as well.  She was started on a PPI drip and bolus fluids, and GI was consulted.  An EGD was performed on 4/24 and showed severe ulcerative reflux esophagitis, which was determined to be the likely source of her bleeding.  She was transitioned to pantoprazole 80 mg daily and started on sucralfate 1 g AC and QHS, with recommendations to remain upright for 90 mins following each meal.  Patient was tolerating a soft diet well and had no further hematemsis following the procure; however, her hemoglobin was 12.8 at admission but decreased daily until it reached 9.1 on 4/25.  On the afternoon of 4/25, patient felt dizzy and was found to be slightly hypotensive at 85/63, so she was given a 500 cc bolus, and a CBC was rechecked.  Patient also said she had urinary frequency and hesitancy, so a UA was performed and was positive for nitrites and bacteria.  She was started on Keflex 500 mg BID x 7 days.  CBC was 9.6 and patient was normotensive after fluid bolus on  4/25.  Patient's CBC on 4/26 continued to be stable at 9.7, and she was discharged home with PCP and GI follow up.  Issues for Follow Up:  1. Patient's course of Keflex for her UTI will be 500 mg BID through 5/2 am.   2. Please recheck a CBC. 3. GI recommendations: PPI 80 mg for 12 weeks, sucralfate 1 g three times daily with meals and QHS for 1-2 weeks, remain upright for 90 minutes after each meal.  Will follow up with GI (Dr. Hilarie Fredrickson at [facility]) on 10/09/17 then get a repeat EGD on 10/23/17.  Significant Procedures: EGD  Significant Labs and Imaging:  Recent Labs  Lab 08/06/17 0448 08/06/17 1413 08/07/17 0727  WBC 5.9 7.6 7.2  HGB 9.1* 9.6* 9.7*  HCT 28.0* 29.1* 29.4*  PLT 236 265 238   Recent Labs  Lab 08/04/17 0447 08/05/17 0257  NA 140 144  K 3.7 3.9  CL 103 110  CO2 26 26  GLUCOSE 195* 98  BUN 40* 24*  CREATININE 1.18* 1.16*  CALCIUM 10.2 8.4*  ALKPHOS 72  --   AST 17  --   ALT 14  --   ALBUMIN 3.6  --      Results/Tests Pending at Time of Discharge: urine culture  Discharge Medications:  Allergies as of 08/07/2017      Reactions   Ace Inhibitors Anaphylaxis, Swelling   Azilect [rasagiline Mesylate] Other (See Comments)   hypotension   Lisinopril Swelling   Severe facial angioedema requiring hospitalization 2007 (approx)      Medication List    TAKE these medications   ACCU-CHEK AVIVA PLUS w/Device  Kit Use to check blood sugar daily. ICD-10 code: E11.9   carbidopa-levodopa 25-100 MG tablet Commonly known as:  SINEMET IR Take 1 tablet by mouth 5 (five) times daily.   cephALEXin 500 MG capsule Commonly known as:  KEFLEX Take 1 capsule (500 mg total) by mouth 2 (two) times daily.   gabapentin 300 MG capsule Commonly known as:  NEURONTIN Take 300-600 mg by mouth See admin instructions. Take  2 capsules in the morning and 1 capsule in the evening as needed   olmesartan 5 MG tablet Commonly known as:  BENICAR Take 2 tablets (10 mg total) by mouth  daily.   omeprazole 40 MG capsule Commonly known as:  PRILOSEC TAKE 1 CAPSULE BY MOUTH TWICE DAILY What changed:  additional instructions   ONE TOUCH ULTRA TEST test strip Generic drug:  glucose blood USE AS DIRECTED   ONETOUCH DELICA LANCETS 08L Misc Daily for Glucose Testing   propranolol ER 80 MG 24 hr capsule Commonly known as:  INDERAL LA Take 1 capsule (80 mg total) by mouth daily.   sertraline 100 MG tablet Commonly known as:  ZOLOFT Take 100 mg by mouth daily. Per psych   sucralfate 1 GM/10ML suspension Commonly known as:  CARAFATE Take 10 mLs (1 g total) by mouth 4 (four) times daily -  with meals and at bedtime.   traMADol 50 MG tablet Commonly known as:  ULTRAM TAKE 1 TO 2 TABLETS BY MOUTH TWICE DAILY AS NEEDED FOR PAIN       Discharge Instructions: Please refer to Patient Instructions section of EMR for full details.  Patient was counseled important signs and symptoms that should prompt return to medical care, changes in medications, dietary instructions, activity restrictions, and follow up appointments.   Follow-Up Appointments: Follow-up Information    [name], MD. Go on 08/12/2017.   Specialties:  Family Medicine, Sports Medicine Why:  Please go to appointment at 11:50AM. Contact information: [address] [place] [address] [phone]        Jerene Bears, MD. Go on 10/09/2017.   Specialty:  Gastroenterology Why:  Please go to pre-procedure apponintment at 1:00PM.  You will have your repeat EGD on 10/23/17 at 9:00AM.  Please bring a driver.  This person should also be able to stay with you after the procedure. Contact information: [address] [place] [address] [phone]        [facility], [facility]-Home Follow up.   Specialty:  Home Health Services Why:  Home health services arranged Contact information: [place] [address] [phone]           Kathrene Alu,  MD 08/07/2017, 11:45 AM PGY-1, [place]

## 2017-08-07 NOTE — Progress Notes (Addendum)
Family Medicine Teaching Service Daily Progress Note Intern Pager: [phone]  [name] Medical record number: [id number] Date of birth: 1946-04-07 Age: 72 y.o. Gender: female  Primary Care Provider: [name], MD Consultants: GI Code Status: full  Pt Overview and Major Events to Date:  Admitted 4/23 EGD 4/24  Assessment and Plan:  [name] is a 72 y.o. female presenting with hematemesis . PMH is significant for parkinsons disease, HTN, MDD, CKD 3, OA/chronic pain  Hematemesis, improved: EGD on 4/24 significant for severe ulcerative reflux esophagitis, which was determined to be likely source of bleeding.  Patient tolerating diet.  Hgb decreased from 12.8 at admission to 9.1 on 4/25 but increased to 9.7 on 4/26.  - continue protonix 40 mg BID  - appreciate GI recommendations: GI recs: ADAT, PPI BID x 12 weeks, sucralfate 1g liquid AC and QHS x 1-2 weeks, upright after meals for 90 mins, repeat upper endoscopy in 12 weeks to check healing. - continue propanolol 80 mg daily - soft diet, ADAT  UTI - Patient reports urinary frequency and hesitancy and was concerned that she had a UTI; UA positive for nitrites and bacteria.   - Keflex 500 mg BID x 7 days (4/25- )  HTN -  Has chronic hypertension, but was hypotensive on 4/25 with BP of 85/63.  Received 500 cc bolus with normalization of BP.  Last BP 115/74 on 4/26.  Olmesartan 10 mg daily as home med. - continue on home olmesartan  MDD - Home med sertraline (zoloft) 100 mg daily.  - continue home Zoloft  OA/chronic pain syndrome - home medications gabapentin 600 mg qAM, 300 mg qPM, Tramadol 50-100 mg BID PRN pain. - hold pain medications for now unless patient requests  Parkinson's disease - Home medication carbidopa-levodopa 1 tab 5 times daily.  CKD 3 - Cr 1.18 on admission, appears to be at baseline. Creatinine 1.16, BUN 40>24 on 4/24. - avoid nephrotoxic agents - renal dose of  Keflex  FEN/GI: soft diet PPx: SCDs  Disposition: home today  Subjective:  Patient says she feels well and would like to go home today.  Objective: Temp:  [97.9 F (36.6 C)-98.4 F (36.9 C)] 98.4 F (36.9 C) (04/26 0451) Pulse Rate:  [57-68] 57 (04/26 0451) Resp:  [16-20] 18 (04/26 0451) BP: (80-125)/(55-105) 121/105 (04/26 0451) SpO2:  [97 %-98 %] 98 % (04/26 0451) Physical Exam: General: lying comfortably in bed, resting tremor d/t parkinson's, alert and talkative Cardiovascular: RRR, no MRG Respiratory: CTAB Abdomen: soft, nontender Extremities: moves all spontaneously  Laboratory: Recent Labs  Lab 08/05/17 1434 08/06/17 0448 08/06/17 1413  WBC 7.6 5.9 7.6  HGB 9.3* 9.1* 9.6*  HCT 28.5* 28.0* 29.1*  PLT 264 236 265   Recent Labs  Lab 08/04/17 0447 08/05/17 0257  NA 140 144  K 3.7 3.9  CL 103 110  CO2 26 26  BUN 40* 24*  CREATININE 1.18* 1.16*  CALCIUM 10.2 8.4*  PROT 6.3*  --   BILITOT 1.1  --   ALKPHOS 72  --   ALT 14  --   AST 17  --   GLUCOSE 195* 98     Imaging/Diagnostic Tests: No results found.   Kathrene Alu, MD 08/07/2017, 7:15 AM PGY-1, [place] Intern pager: [phone], text pages welcome

## 2017-08-07 NOTE — Care Management Important Message (Signed)
Important Message  Patient Details  Name: [name] MRN: [id number] Date of Birth: [birth year]   Medicare Important Message Given:  Yes    Dj Senteno [address]ine [address] 08/07/2017, 1:48 PM

## 2017-08-07 NOTE — Patient Outreach (Addendum)
[facility]) Care Management  08/07/2017  [name] 1945-06-15 [id number]   Care coordination: Update received from [facility], hospital liaison.  Patient has agreed to telephone follow up with [facility] care management nurse. Still in hospital at this time.  Follow up at discharge.   Transition of care completed by patients primary care provider.   PLAN: RNCM will follow up with patient after discharge to home.   Quinn Plowman RN,BSN,CCM [facility] Telephonic  [phone]

## 2017-08-07 NOTE — Care Management Note (Signed)
Case Management Note  Patient Details  Name: [name] MRN: [id number] Date of Birth: [birth year]  Subjective/Objective:         Presented with hematemesis.  [name] (Spouse) [name] (Daughter)    [phone]     PCP: [name]  Action/Plan: Transition to home with home health services(PT) to follow. [facility] telephonic RN to f/u as well. Family will provide transportation to home.  Expected Discharge Date:   08/07/2017          Expected Discharge Plan:  Home/Self Care(Resides with husband)  In-House Referral:     Discharge planning Services  CM Consult  Post Acute Care Choice:    Choice offered to:  Patient  DME Arranged:   N/A DME Agency:   N/A  HH Arranged:  PT HH Agency:  [facility]  Status of Service:  Completed, signed off  If discussed at [place] of Stay Meetings, dates discussed:    Additional Comments:  Sharin Mons, RN 08/07/2017, 10:35 AM

## 2017-08-07 NOTE — Progress Notes (Signed)
Discharge instruction provided to the patient and her spouse [name]. They both verbalized understanding using teach back. Patient iv removed and catheter is intact. Prescription faxed in to [facility].

## 2017-08-09 LAB — URINE CULTURE: Culture: >=100000 — AB

## 2017-08-10 ENCOUNTER — Telehealth: Payer: Self-pay

## 2017-08-10 ENCOUNTER — Other Ambulatory Visit: Payer: Self-pay | Admitting: Pharmacist

## 2017-08-10 DIAGNOSIS — I129 Hypertensive chronic kidney disease with stage 1 through stage 4 chronic kidney disease, or unspecified chronic kidney disease: Secondary | ICD-10-CM | POA: Diagnosis not present

## 2017-08-10 DIAGNOSIS — M170 Bilateral primary osteoarthritis of knee: Secondary | ICD-10-CM | POA: Diagnosis not present

## 2017-08-10 DIAGNOSIS — R1310 Dysphagia, unspecified: Secondary | ICD-10-CM | POA: Diagnosis not present

## 2017-08-10 DIAGNOSIS — N183 Chronic kidney disease, stage 3 (moderate): Secondary | ICD-10-CM | POA: Diagnosis not present

## 2017-08-10 DIAGNOSIS — G894 Chronic pain syndrome: Secondary | ICD-10-CM | POA: Diagnosis not present

## 2017-08-10 DIAGNOSIS — K210 Gastro-esophageal reflux disease with esophagitis: Secondary | ICD-10-CM | POA: Diagnosis not present

## 2017-08-10 DIAGNOSIS — G20 Parkinson's disease: Secondary | ICD-10-CM | POA: Diagnosis not present

## 2017-08-10 DIAGNOSIS — N390 Urinary tract infection, site not specified: Secondary | ICD-10-CM | POA: Diagnosis not present

## 2017-08-10 DIAGNOSIS — M48061 Spinal stenosis, lumbar region without neurogenic claudication: Secondary | ICD-10-CM | POA: Diagnosis not present

## 2017-08-10 NOTE — Telephone Encounter (Signed)
Tharon Aquas, PT with [facility], left message requesting verbal orders:  PT 2 times a week for 3 weeks and 1 time a week for 2 weeks to work on strengthening, ambulation and endurance.  Also for an OT assessment.  Can leave orders on voicemail. Number is [phone].  Danley Danker, RN [facility] RN)

## 2017-08-10 NOTE — Telephone Encounter (Signed)
Ok to leave voice orders as above [place]! [name]

## 2017-08-10 NOTE — Patient Outreach (Signed)
[facility]) Care Management  08/10/2017  NEWELL FRATER [birth year] [id number]  72 year old female referred to [place] [facility] by [facility] inpatient liaison for transition of care services.  [place] services requested for medication managment.  PMHx includes, but not limited to, major depressive disorder, diabetes mellitus, HTN, HLD, arthritis, scoliosis, Parkinson's disease, and cognitive impairment.  Noted recent hospitalization 08/04/17 - 08/07/17 for severe ulcerative reflux esophagitis, also found to have UTI, discharged on cephalexin, sucralfate, and increased dose of PPI.    Unsuccessful call attempts placed to Ms. Iddings on her home and mobile phone numbers.  I left a HIPAA compliant voicemail on the mobile phone, no option for voicemail on home phone.   Successful call to Ms. Edward's daughter, [name], who is listed on consent form.  Daughter reports patient is confused regarding medications and would benefit from a home visit to review all medications in person.  She is currently getting all medications filled at [facility] and is no longer using a pillbox.  Daughter is interested in compliance packaging however notes that Sinemet is five times daily dosing therefore may not work with compliance packaging time slots.  I will bring an example compliance pack to home visit for family to view.   Plan: Home visit scheduled with Ms. Joy and daughter next Tuesday, May 8th, at 10:30AM.    Ralene Bathe, PharmD, [place] [phone]

## 2017-08-10 NOTE — Telephone Encounter (Signed)
Verbal given per Dr. Nori Riis. Tacy Dura Rumple, April D, CMA

## 2017-08-11 ENCOUNTER — Other Ambulatory Visit: Payer: Self-pay

## 2017-08-11 NOTE — Patient Outreach (Signed)
[facility]) Care Management  08/11/2017  SHAMETRA CUMBERLAND Sep 06, 1945 [id number]  Referral date 08/05/17 Referral source;  [facility] hospital [place]: [facility]  Telephone call to patient for follow up. Contact answering phone states he is patients husband.  Informed spouse would have to receive HIPAA authorization from patient to speak with him.  HIPAA compliant message left with return  Phone number.  Contact states patient would have to call RNCM on tomorrow.   PLAN:  RNCM will attempt 2nd telephone call to patient within 4 business days.  RNCM will send outreach letter to attempt contact.   Quinn Plowman RN,BSN,CCM [facility] Telephonic  [phone]

## 2017-08-12 ENCOUNTER — Ambulatory Visit (INDEPENDENT_AMBULATORY_CARE_PROVIDER_SITE_OTHER): Payer: Medicare HMO | Admitting: Family Medicine

## 2017-08-12 ENCOUNTER — Other Ambulatory Visit: Payer: Self-pay | Admitting: Family Medicine

## 2017-08-12 ENCOUNTER — Other Ambulatory Visit: Payer: Self-pay

## 2017-08-12 ENCOUNTER — Encounter: Payer: Self-pay | Admitting: Family Medicine

## 2017-08-12 VITALS — BP 134/74 | HR 50 | Temp 97.8°F | Ht 64.0 in | Wt 169.6 lb

## 2017-08-12 DIAGNOSIS — K209 Esophagitis, unspecified without bleeding: Secondary | ICD-10-CM

## 2017-08-12 DIAGNOSIS — N3000 Acute cystitis without hematuria: Secondary | ICD-10-CM

## 2017-08-12 DIAGNOSIS — R42 Dizziness and giddiness: Secondary | ICD-10-CM | POA: Diagnosis not present

## 2017-08-12 DIAGNOSIS — I10 Essential (primary) hypertension: Secondary | ICD-10-CM

## 2017-08-12 DIAGNOSIS — G894 Chronic pain syndrome: Secondary | ICD-10-CM | POA: Diagnosis not present

## 2017-08-12 MED ORDER — PROPRANOLOL HCL ER 60 MG PO CP24: 60.0000 mg | ORAL_CAPSULE | Freq: Every day | ORAL | 1 refills | Status: DC

## 2017-08-12 NOTE — Patient Instructions (Signed)
We are changing your Inderal LA from 80 mg to 60 mg

## 2017-08-13 ENCOUNTER — Encounter: Payer: Self-pay | Admitting: Family Medicine

## 2017-08-13 ENCOUNTER — Inpatient Hospital Stay: Payer: Self-pay | Admitting: Family Medicine

## 2017-08-13 LAB — CBC
Hematocrit: 30.7 % — ABNORMAL LOW (ref 34.0–46.6)
Hemoglobin: 9.9 g/dL — ABNORMAL LOW (ref 11.1–15.9)
MCH: 30.4 pg (ref 26.6–33.0)
MCHC: 32.2 g/dL (ref 31.5–35.7)
MCV: 94 fL (ref 79–97)
Platelets: 327 10*3/uL (ref 150–379)
RBC: 3.26 x10E6/uL — ABNORMAL LOW (ref 3.77–5.28)
RDW: 14.7 % (ref 12.3–15.4)
WBC: 8.1 10*3/uL (ref 3.4–10.8)

## 2017-08-13 NOTE — Assessment & Plan Note (Signed)
Her psychiatrist has her on gabapentin.  We will need to keep that in mind if she continues to have dizziness.  We will also need to continue to follow her renal function.

## 2017-08-13 NOTE — Assessment & Plan Note (Signed)
We have used propranolol to help with some of her underlying tremor as well as her blood pressure.  At this point she is somewhat bradycardic and I think is contributing to her dizziness.  Will decrease to 60 mg from 80.  She will let me know how she is doing, we may need to change this even further in the future.

## 2017-08-13 NOTE — Assessment & Plan Note (Signed)
We will recheck hemoglobin today.  She has had no more emesis.  She continues on the medications as per hospital discharge summary.  I will see her back in 1 month.  She will call with any new symptoms or return of symptoms.

## 2017-08-13 NOTE — Progress Notes (Signed)
    CHIEF COMPLAINT / HPI: Follow-up recent hospitalization for upper GI bleed.  The first few days, she felt pretty good.  Yesterday and today she feels a little bit tired.  She also reports being lightheaded a few times when she gets up from a chair.  The lightheaded episode lasts 10 to 20 seconds. 2.  She completed the antibiotic course for her urinary tract infection is having no symptoms.  REVIEW OF SYSTEMS: No fever.  No more coffee-ground emesis.  No emesis at all.  Bowel movements are normal.  Fatigue as per HPI.  No headaches.  No shortness of breath.  No chest pain.  PERTINENT  PMH / PSH: I have reviewed the patient's medications, allergies, past medical and surgical history, smoking status and updated in the EMR as appropriate.   OBJECTIVE:  Vital signs reviewed. GENERAL: Well-developed, well-nourished, no acute distress. CARDIOVASCULAR: Bradycardic, regular rhythm no murmur gallop or rub LUNGS: Clear to auscultation bilaterally, no rales or wheeze. ABDOMEN: Soft positive bowel sounds NEURO: Pill-rolling tremor bilateral hands at rest.  Oral buccal facial dyskinesias. MSK: Movement of extremity x 4. Cogwheel rigidity.  She is walking with a rolling walker.  Her stride length is short. PSYCHIATRIC: Alert and oriented x4.  Affect is pleasant and interactive.  She asks and answers questions appropriately.  Recent and remote memory is normal. Speech content is normal. Mild dysarthria.     ASSESSMENT / PLAN: UTI: Symptoms resolved she is bleeding her antibiotic course.  See reason to test of cure at this point.   Please see problem oriented charting for details

## 2017-08-14 ENCOUNTER — Encounter: Payer: Self-pay | Admitting: Family Medicine

## 2017-08-14 ENCOUNTER — Other Ambulatory Visit: Payer: Self-pay

## 2017-08-14 NOTE — Patient Outreach (Signed)
[facility]) Care Management  08/14/2017  Mallory MONCEAUX 21-Sep-1945 [id number]  Telephone assessment;  Referral date 08/05/17 Referral source;  [facility] hospital [place] Insurance: [facility]  Telephone call to patient regarding follow up assessment. HIPAA verified with patient. Explained reason for call. Patient states she is still having dizziness. Explains that earlier today she was going to meet with physical therapy but she just could not do it due to feeling sick on her stomach.  Patient states she had diarrhea and is still having some dizziness.  She reports she has had problems with diarrhea and vomiting since she was diagnosed with Parkinson's approximately 3 years ago. Patient states she is unsure why she is having the dizziness.  Patient states she is not able to talk anymore because she has company. Patient request call back next week.   PLAN; RNCM will call patient within 4 business days.   Quinn Plowman RN,BSN,CCM [facility] Telephonic  [phone]

## 2017-08-18 ENCOUNTER — Other Ambulatory Visit: Payer: Self-pay

## 2017-08-18 ENCOUNTER — Telehealth: Payer: Self-pay

## 2017-08-18 NOTE — Telephone Encounter (Signed)
Pt's daugher [name] called nurse line, wanted a message forwarded to Dr. Nori Riis about patient. Daughter concerned patient is experiencing increased depression, reports patient is sleeping a lot. Daughter thinks sx are related to patients temazepam rx- given to her by a different MD. Call back number [phone] Wallace Cullens, RN

## 2017-08-18 NOTE — Patient Outreach (Signed)
[facility] [facility]) Care Management  08/18/2017  DENETTE HASS [birth year] [id number]  Telephone assessment;  Referral date 08/05/17 Referral source; [facility] hospital [place]: [facility]   Telephone call to patient regarding assessment follow up. Spoke with daughter, [name] who is listed on consent form.  Explained reason for call.  Discussed and offered [facility] care management services.  Daughter states she would like to have a nurse follow up to make sure they have everything straight.  Daughter states patient is not doing to good today. Daughter states patient has days that she feels better than others. Daughter states its hard to get patient motivated. States patient doesn't like to leave the house. Daughter states patient is being seen by a psychiatrist and is on antidepressant medication.  Daughter states patient was in the hospital recently due to have esophagus ulcers and a hernia. Daughter states patient has to eat her food finely chopped because she gets choked and the food sits in her throat. Daughter reports patient has a follow up scheduled with the gastroenterologist and saw her primary MD on 08/12/17. Daughter states [facility] is providing [facility] therapy services and patient is receiving  A personal care aid for 8 hours  Per week.  Daughter states the pharmacist with [facility] care management will be coming to their home on tomorrow 08/19/17.   Daughter denies patient having any new symptoms. States patient continues with her normal symptoms of nausea at times.  RNCM advised that patient should continue to take her medications as prescribed and follow up with doctors as scheduled. Daughter states [facility] welcome packet was received when patient was in hospital. Consent was signed.  RNCM advised patient to notify MD of any changes in condition prior to scheduled appointment. RNCM provided contact name and number: [phone] or main office number [phone]  and 24 hour nurse advise line [phone].  RNCM verified patient aware of 911 services for urgent/ emergent needs.   PLAN; RNCM will follow up with patient and/ or daughter within 1 week.  RNCM will send patients primary MD involvement letter.   Quinn Plowman RN,BSN,CCM [facility] Telephonic  [phone]

## 2017-08-19 ENCOUNTER — Telehealth: Payer: Self-pay

## 2017-08-19 ENCOUNTER — Other Ambulatory Visit: Payer: Self-pay | Admitting: Pharmacist

## 2017-08-19 DIAGNOSIS — I129 Hypertensive chronic kidney disease with stage 1 through stage 4 chronic kidney disease, or unspecified chronic kidney disease: Secondary | ICD-10-CM | POA: Diagnosis not present

## 2017-08-19 DIAGNOSIS — M48061 Spinal stenosis, lumbar region without neurogenic claudication: Secondary | ICD-10-CM | POA: Diagnosis not present

## 2017-08-19 DIAGNOSIS — G20 Parkinson's disease: Secondary | ICD-10-CM | POA: Diagnosis not present

## 2017-08-19 DIAGNOSIS — K210 Gastro-esophageal reflux disease with esophagitis: Secondary | ICD-10-CM | POA: Diagnosis not present

## 2017-08-19 DIAGNOSIS — N183 Chronic kidney disease, stage 3 (moderate): Secondary | ICD-10-CM | POA: Diagnosis not present

## 2017-08-19 DIAGNOSIS — M170 Bilateral primary osteoarthritis of knee: Secondary | ICD-10-CM | POA: Diagnosis not present

## 2017-08-19 DIAGNOSIS — R1310 Dysphagia, unspecified: Secondary | ICD-10-CM | POA: Diagnosis not present

## 2017-08-19 DIAGNOSIS — N390 Urinary tract infection, site not specified: Secondary | ICD-10-CM | POA: Diagnosis not present

## 2017-08-19 DIAGNOSIS — G894 Chronic pain syndrome: Secondary | ICD-10-CM | POA: Diagnosis not present

## 2017-08-19 NOTE — Patient Outreach (Addendum)
[name]) Care Management  08/19/2017  [name] 06-30-1945 [id number]  Home visit with Ms. [name], daughter [name], and spouse [name] to review medications and address compliance.    Subjective:  Upon arrival, daughter reports that patient has been "in bed for the last 4 days" and has only had a small portion of "chicken broth and water in the last 2 days."  She states that patient recently had tempazepam medication filled (written by Dr. Casimiro Needle, patient's psychiatrist) and daughter is concerned patient's sleepiness may be related to this medication. Patient cancelled office visit with Dr. Casimiro Needle earlier this week due to sleepiness.  Daughter also reports that patient was drinking heavily prior to recent hospitalization, up to 1/2 bottle of [place] nightly but has since stopped.  Patient not currently receiving counseling therapy and does not have a POA per daughter. Daughter reports that an aide is coming to the house every Tuesday / Thursday for 4 hours each day to assist with cleaning and bathing.  Per daughter, patient is not checking her blood sugars regularly but still has glucometer and supplies at home if needed.   Objective:  Hemoglobin A1c = 5.6 (07/28/17) SCr = 1.16 (08/05/17)  Medications Reviewed Today    Reviewed by [name], MD (Physician) on 08/13/17 at 1129  Med List Status: <None>  Medication Order Taking? Sig Documenting Provider Last Dose Status Informant  Blood Glucose Monitoring Suppl (ACCU-CHEK AVIVA PLUS) w/Device KIT [id number]  Use to check blood sugar daily. ICD-10 code: E11.9 [name], MD  Active Self  carbidopa-levodopa (SINEMET IR) 25-100 MG tablet [id number] No Take 1 tablet by mouth 5 (five) times daily. Penni Bombard, MD 08/03/2017 Active Self  cephALEXin (KEFLEX) 500 MG capsule [id number]  Take 1 capsule (500 mg total) by mouth 2 (two) times daily. Everrett Coombe, MD  Active   gabapentin (NEURONTIN) 300 MG capsule  [id number] No Take 300-600 mg by mouth See admin instructions. Take  2 capsules in the morning and 1 capsule in the evening as needed Norma Fredrickson, MD Past Week Unknown time Active Self           Med Note Tamala Julian, JEFFREY W   Tue Aug 04, 2017  6:30 AM)    olmesartan (BENICAR) 5 MG tablet [id number] No Take 2 tablets (10 mg total) by mouth daily. [name], MD 08/02/2017 Active Self  omeprazole (PRILOSEC) 40 MG capsule [id number]  TAKE 1 CAPSULE BY MOUTH TWICE DAILY Winfrey, Alcario Drought, MD  Active   ONE TOUCH ULTRA TEST test strip [id number]  USE AS DIRECTED [name], MD  Active Self  [facility] LANCETS 81K MISC [id number]  Daily for Glucose Testing [name], MD  Active Self  propranolol ER (INDERAL LA) 60 MG 24 hr capsule [id number]  TAKE ONE CAPSULE BY MOUTH DAILY [name], MD  Active   sertraline (ZOLOFT) 100 MG tablet [id number] No Take 100 mg by mouth daily. Per psych [provider] 07/26/2017 Active Self           Med Note Tamala Julian, JEFFREY W   Tue Aug 04, 2017  6:30 AM)    sucralfate (CARAFATE) 1 GM/10ML suspension [id number]  Take 10 mLs (1 g total) by mouth 4 (four) times daily -  with meals and at bedtime. Kathrene Alu, MD  Active   traMADol (ULTRAM) 50 MG tablet [id number] No TAKE 1 TO 2 TABLETS BY MOUTH TWICE DAILY AS NEEDED FOR  PAIN [name], MD Past Week Unknown time Active Self         Assessment:   Drugs sorted by system:  Neurologic/Psychologic: carbidopa-levodopa, gabapentin, sertraline, temazepam  Cardiovascular: olmesartan, propranolol  Gastrointestinal:omeprazole, sucralfate  Pain: tramadol  Infectious Diseases: cephalexin  Duplications in therapy: none Gaps in therapy: no statin currently.  Patient self-discontinued medication.  Consider resuming as clinically appropriate.   Medications to avoid in the elderly:  Temazepam:This drug is identified in the Beers Criteria as a potentially inappropriate medication to be avoided in patients 65  years and older (independent of diagnosis or condition) due to increased risk of impaired cognition, delirium, falls, fractures, and motor vehicle accidents with benzodiazepine use. Patient with history of alcohol abuse and currently overusing temazepam.  Providers made aware and dose reduced.  Monitor closely and adjust further as warranted.   Drug interactions: none Other issues noted:   Allergy to ACEIs (rxn: angioedema) -Cross sensitivity with ARB.  Patient tolerated ARB in the past.  We reviewed signs and symptoms of anaphylaxis.     Cephalexin: Patient did not complete antibiotic course of cephalexin after recent discharge, 4 pills still remaining.  No signs and symptoms of UTI per daughter.    I reviewed signs and symptoms of UTI with family and counseled to not take remaining 2 days as several weeks since discharge and this could contribute to antibiotic resistance.  Medication removed from home with permission.    Olmesartan: No medication in home.  Daughter reports patient has not taken in several months.    Sinemet:  Patient using old bottle of Sinemet with previous instructions of TID rather than 5x daily.    New dose reviewed with family.  Old medication removed with permission.    Temazepam: Not currently on medication list.  Abusing medication per review of medication count.  39 pills of medication missing within 7 days of filling medication at pharmacy.  If taking correctly, only 14 pills should be missing.  65% of prescription used within 1st week.  During visit, patient took remaining bottle back to her room and dumped pills into toilet.     Propranolol: Dose recently reduced by Dr. Nori Riis however patient still taking old dose.    New dose reviewed with family.  Old medication removed with permission.     Several bottles of discontinued and expired medications in patient's possession   I removed these from patient's home with permission and disposed of these at police  department  Care coordination call to [place]: Patient has regularly filled temazepam for several months.  Patient called pharmacy after our visit this morning to request a refill and reported that she had "lost" her medication.    Care coordination call to Dr. Casimiro Needle and Dr. Nori Riis regarding medication issues above.    Patient should still be taking olmesartan per Dr. Verlon Au office.    Office will relay message regarding temazepam to provider. Office will call patient to discuss.   Daughter reports Dr. Casimiro Needle would like patient to reduce dose of temazepam from 45m --> 140mQHS.  New RX will not be able to be filled until June.     Medication adherence:  Very poor compliance with medications as evidenced by pill bottle counts and history from family.  Medications and indications reviewed with family.  I provided a 2 slot pillbox to family to use to improve medication compliance.  Family is not interested in using compliance packaging at this point.  Spouse voiced  understanding and will try to use the pillbox this week to see if helps.  Daughter will pick up olmesartan tomorrow and add to pillbox.  Daughter will try to reschedule office visit with Dr. Casimiro Needle and make appt with Dr. Nori Riis later this week if patient agreeable.   Plan: I will follow-up with patient and family next week regarding medication adherence.   Ralene Bathe, PharmD, [place] [phone]

## 2017-08-19 NOTE — Telephone Encounter (Signed)
I spoke individually with the patient, her daughter, physical therapist who just happened to be there.  The physical therapist was concerned that the patient was very somnolent and she was unable to arouse her enough to get evaluation done.  The patient tells me she was up all night coughing and does not want to have an evaluation done right now.  The daughter tells me she is worried that her mom may be taking more of her temazepam than she should.  Physical therapist also echoes this concerned.  I do not prescribe that medicine so I told him to call the prescribing physician's office and give them that information.  I told [name] to call tomorrow for an appointment if she is not improving.  Difficult situation

## 2017-08-19 NOTE — Telephone Encounter (Signed)
Received call from [facility], pharmacist with [facility], who conducted an in-home evaluation with patient today.  Reports patient not compliant at all with medications, has not started Propranolol from 07/28/17 visit and has not picked up Olmesartan. Patient had many old and expired meds which Colleen discarded.  Of note, Temazepam #60 fill on 08/13/17 and only #21 left. When questioned, patient became angry and dumped them in toilet.   Daughter states mother been lying in bed and sleeping for days, not eating, only drinking broth.   Pharmacist has not seen patient before but felt she was altered. See her note from today.  I attempted to call patient's daughter based on phone note from 08/18/17 but was unable to reach. Will forward to PCP for advice.  Colleen's call back is [phone]  [name], daughter is [phone]  Danley Danker, RN [facility])

## 2017-08-20 ENCOUNTER — Ambulatory Visit (INDEPENDENT_AMBULATORY_CARE_PROVIDER_SITE_OTHER): Payer: Medicare HMO | Admitting: Family Medicine

## 2017-08-20 ENCOUNTER — Encounter: Payer: Self-pay | Admitting: Family Medicine

## 2017-08-20 ENCOUNTER — Other Ambulatory Visit: Payer: Self-pay

## 2017-08-20 VITALS — BP 134/62 | HR 90 | Temp 97.9°F | Ht 64.0 in | Wt 160.6 lb

## 2017-08-20 DIAGNOSIS — F339 Major depressive disorder, recurrent, unspecified: Secondary | ICD-10-CM

## 2017-08-20 DIAGNOSIS — K449 Diaphragmatic hernia without obstruction or gangrene: Secondary | ICD-10-CM

## 2017-08-20 DIAGNOSIS — N390 Urinary tract infection, site not specified: Secondary | ICD-10-CM | POA: Diagnosis not present

## 2017-08-20 DIAGNOSIS — K279 Peptic ulcer, site unspecified, unspecified as acute or chronic, without hemorrhage or perforation: Secondary | ICD-10-CM | POA: Diagnosis not present

## 2017-08-20 DIAGNOSIS — G894 Chronic pain syndrome: Secondary | ICD-10-CM | POA: Diagnosis not present

## 2017-08-20 DIAGNOSIS — K2100 Gastro-esophageal reflux disease with esophagitis, without bleeding: Secondary | ICD-10-CM | POA: Insufficient documentation

## 2017-08-20 DIAGNOSIS — I129 Hypertensive chronic kidney disease with stage 1 through stage 4 chronic kidney disease, or unspecified chronic kidney disease: Secondary | ICD-10-CM | POA: Diagnosis not present

## 2017-08-20 DIAGNOSIS — R300 Dysuria: Secondary | ICD-10-CM | POA: Diagnosis not present

## 2017-08-20 DIAGNOSIS — M48061 Spinal stenosis, lumbar region without neurogenic claudication: Secondary | ICD-10-CM | POA: Diagnosis not present

## 2017-08-20 DIAGNOSIS — N183 Chronic kidney disease, stage 3 (moderate): Secondary | ICD-10-CM | POA: Diagnosis not present

## 2017-08-20 DIAGNOSIS — G20 Parkinson's disease: Secondary | ICD-10-CM | POA: Diagnosis not present

## 2017-08-20 DIAGNOSIS — I10 Essential (primary) hypertension: Secondary | ICD-10-CM

## 2017-08-20 DIAGNOSIS — R1310 Dysphagia, unspecified: Secondary | ICD-10-CM | POA: Diagnosis not present

## 2017-08-20 DIAGNOSIS — M170 Bilateral primary osteoarthritis of knee: Secondary | ICD-10-CM | POA: Diagnosis not present

## 2017-08-20 NOTE — Assessment & Plan Note (Signed)
Hold ACE

## 2017-08-20 NOTE — Progress Notes (Signed)
   Subjective:    Patient ID: [name], female    DOB: 10/21/1945, 72 y.o.   MRN: [id number]  HPI Complicated visit: Hospital follow up for abd discomfort and hematemesis.  EGD showed severe, ulcerative esophagitis.  Went home feeling well.  Now has a return of abd pain and diminished appetite.  Poor PO intake x 1 week.    While in the room with her, she complained of dizziness and wanted to lay down.  "I feel like I am going to pass out."  Felt back to normal after laying down for a while.  During med review, I believe she is not currently taking her benicar.    Review of Systems Denies fever, SOB, vomiting or bleeding.  Does endorse multiple depressive symptoms in the room.  I observed, "You have had a tough few weeks."  She retorted that "I have had a tough life..."  Also endorses over using her sleeping pills. "I just am so tired and need rest."  No HI or SI     Objective:   Physical Exam 9 Lb wt loss is noted. BP is fine Pale (Hgb at DC was 9.9) Lungs clear Cardiac RRR without m or g Abd.  Mild epigastric tenderness. Ext no edema.        Assessment & Plan:

## 2017-08-20 NOTE — Assessment & Plan Note (Signed)
I am not sure how much of her sx are organic and how much psychologic.  My gut tells me to worry about both.

## 2017-08-20 NOTE — Assessment & Plan Note (Signed)
Concerned about poor PO intake and perhaps worsening anemia.  Recheck labs.  Hold ACE.

## 2017-08-20 NOTE — Patient Instructions (Addendum)
I am worried about you.   I want you seen tomorrow. The doctor tomorrow will have my blood test results. Between now and tomorrow, three important things. 1. Drink plenty of fluids. 2. Do not take any sleeping pill - temazepin/restoril 3. Do not take any omlesartin/benicar.

## 2017-08-21 ENCOUNTER — Encounter: Payer: Self-pay | Admitting: Student

## 2017-08-21 ENCOUNTER — Ambulatory Visit (INDEPENDENT_AMBULATORY_CARE_PROVIDER_SITE_OTHER): Payer: Medicare HMO | Admitting: Student

## 2017-08-21 VITALS — BP 120/60 | HR 59 | Temp 98.2°F | Wt 161.8 lb

## 2017-08-21 DIAGNOSIS — R05 Cough: Secondary | ICD-10-CM | POA: Diagnosis not present

## 2017-08-21 DIAGNOSIS — R059 Cough, unspecified: Secondary | ICD-10-CM

## 2017-08-21 DIAGNOSIS — R112 Nausea with vomiting, unspecified: Secondary | ICD-10-CM | POA: Diagnosis not present

## 2017-08-21 DIAGNOSIS — R42 Dizziness and giddiness: Secondary | ICD-10-CM

## 2017-08-21 LAB — CMP14+EGFR
ALT: 5 IU/L (ref 0–32)
AST: 21 IU/L (ref 0–40)
Albumin/Globulin Ratio: 1.5 (ref 1.2–2.2)
Albumin: 4.3 g/dL (ref 3.5–4.8)
Alkaline Phosphatase: 84 IU/L (ref 39–117)
BUN/Creatinine Ratio: 14 (ref 12–28)
BUN: 17 mg/dL (ref 8–27)
Bilirubin Total: 0.3 mg/dL (ref 0.0–1.2)
CO2: 21 mmol/L (ref 20–29)
Calcium: 9.8 mg/dL (ref 8.7–10.3)
Chloride: 103 mmol/L (ref 96–106)
Creatinine, Ser: 1.23 mg/dL — ABNORMAL HIGH (ref 0.57–1.00)
GFR calc Af Amer: 51 mL/min/{1.73_m2} — ABNORMAL LOW (ref >59)
GFR calc non Af Amer: 44 mL/min/{1.73_m2} — ABNORMAL LOW (ref >59)
Globulin, Total: 2.8 g/dL (ref 1.5–4.5)
Glucose: 107 mg/dL — ABNORMAL HIGH (ref 65–99)
Potassium: 3.8 mmol/L (ref 3.5–5.2)
Sodium: 143 mmol/L (ref 134–144)
Total Protein: 7.1 g/dL (ref 6.0–8.5)

## 2017-08-21 LAB — CBC
Hematocrit: 33.8 % — ABNORMAL LOW (ref 34.0–46.6)
Hemoglobin: 11.1 g/dL (ref 11.1–15.9)
MCH: 29.1 pg (ref 26.6–33.0)
MCHC: 32.8 g/dL (ref 31.5–35.7)
MCV: 89 fL (ref 79–97)
Platelets: 360 10*3/uL (ref 150–379)
RBC: 3.82 x10E6/uL (ref 3.77–5.28)
RDW: 14.6 % (ref 12.3–15.4)
WBC: 4.9 10*3/uL (ref 3.4–10.8)

## 2017-08-21 LAB — LIPASE: Lipase: 31 U/L (ref 14–85)

## 2017-08-21 MED ORDER — PROPRANOLOL HCL ER 60 MG PO CP24: 60.0000 mg | ORAL_CAPSULE | ORAL | 1 refills | Status: DC

## 2017-08-21 NOTE — Progress Notes (Signed)
Subjective:    [name] is a 72 y.o. old female here for follow-up on lightheadedness.  HPI Lightheadedness: patient was seen in clinic for hospital follow-up for abdominal discomfort and hematemesis.  At that visit, she felt lightheaded which has improved with lying down.  CBC, CMP and lipase and checked and significant for slightly elevated serum creatinine to 1.23 (baseline about 1.2).  Hemoglobin 11.1 (baseline 9.9).  She was advised to return to clinic today  Today, reports lightheadedness for about a week.  She denies vertigo.  Daughter stated that her energy and appetite is low.  She also reports nausea and emesis.  She is not currently nauseous.  Last episode of emesis was last night.  She says she had 2 episodes of emesis yesterday.  Emesis was nonbloody nonbilious.  She also reports abdominal pain for about 3 weeks.  She points at a bellybutton area.  She describes the pain as sore.  Pain is on and off and not every day.  Denies radiation to her back but she has chronic back pain for years.  She denies dysuria, increased frequency of urination or urgency.  She denies diarrhea or constipation.  Last bowel movement yesterday and normal.  She denies straining.  She denies blood in stool or dark stool.   She denies dyspnea or chest pain.  She reports cough.  Cough is dry.  Denies hemoptysis.  Daughter states that the whole family has some sort of viral illness going on.  She denies fever or chills.  Patient was hospitalized about a month ago for acute on chronic hematemesis.  She had an EGD performed that showed ulcerative reflux esophagitis which was thought to be the source of her bleeding.  She was discharged on PPI and Carafate.  Patient denies recent medication change.  She says she has been off Benicar for over a month.  PMH/Problem List: has Diabetes mellitus without complication ([place]); Hyperlipidemia; Major depressive disorder, recurrent episode ([place]); CARPAL TUNNEL SYNDROME, LEFT;  Essential hypertension; Osteoarthritis of multiple joints; ARTHRITIS, BACK; Disorder of bursae and tendons in shoulder region; SCOLIOSIS; Esophageal abnormality; Substance abuse, episodic; S/P TKR (total knee replacement), RIGHT; Parkinson's disease ([place]); Dysphagia, unspecified(787.20); Cognitive impairment; Urticaria, idiopathic; Left knee pain; Chronic pain syndrome; Non-intractable vomiting with nausea; Tinnitus; CKD stage 3 due to type 2 diabetes mellitus ([place]); Frequent falls; Swelling of lower extremity; Shortness of breath; Osteoarthritis of left knee; Hematemesis; Hematochezia; Acute esophagitis; Hiatal hernia with GERD and esophagitis; and Lightheadedness on their problem list.   has a past medical history of Allergy, Anxiety, Arthritis, Chronic bronchitis ([place]), Chronic mid back pain, Cognitive impairment (6/33/3545), Complication of anesthesia (07/2008), Dementia, Esophageal stricture, GERD (gastroesophageal reflux disease), History of gout (1970's), Hyperlipidemia, Hypertension, Movement disorder, Osteoarthritis, Parkinson's disease ([place]), Scoliosis, Situational depression, Type II diabetes mellitus ([place]), and UTI (lower urinary tract infection).  FH:  Family History  Problem Relation Age of Onset  . Heart disease Mother   . Diabetes Mother   . Heart disease Father   . Heart attack Sister   . Heart attack Brother   . Cerebral palsy Son     [facility] Social History   Tobacco Use  . Smoking status: Never Smoker  . Smokeless tobacco: Never Used  Substance Use Topics  . Alcohol use: No  . Drug use: No    Review of Systems Review of systems negative except for pertinent positives and negatives in history of present illness above.     Objective:     Vitals:  08/21/17 1131  BP: 120/60  Pulse: (!) 59  Temp: 98.2 F (36.8 C)  TempSrc: Oral  SpO2: 95%  Weight: 161 lb 12.8 oz (73.4 kg)   Body mass index is 27.77 kg/m. No data found. Physical Exam  GEN: appears well &  comfortable.  Resting tremor Head: normocephalic and atraumatic  Eyes: conjunctiva without injection. Sclera anicteric. Oropharynx: MMM. No erythema. No exudation or petechiae.  Uvula midline HEM: negative for cervical or periauricular lymphadenopathies CVS: Bradycardic to 54, skipped beat every third beat, nl s1 & s2, no murmurs, no edema RESP: no IWOB, good air movement bilaterally, CTAB GI: BS present & normal, mild tenderness to palpation around her bellybutton.  No guarding or rebound.  No palpable mass. GU: no suprapubic or CVA tenderness SKIN: no apparent skin lesion NEURO: alert and oiented appropriately, no gross deficits   PSYCH: euthymic mood with congruent affect     Assessment and Plan:  1. Lightheadedness: could be iatrogenic.  She is on propranolol ER 60 mg daily for tremor.  She has history of Parkinson disease with resting tremor but no essential tremor on exam.  She is bradycardic to 50's.  Heart sounds skips every 4th beat.  Patient is also on temazepam and tramadol which can contribute.  Parkinson disease and dementia could have some roles to play as well.  Orthostatic vitals are normal but BP on low side of normal. Hemoglobin 11.1, higher than baseline.  CMP not impressive except for mildly elevated creatinine to 1.23.  -We space out propranolol ER 60 mg to every other day. - -Discussed fall prevention -Follow-up in 2 weeks  2. Non-intractable vomiting with nausea, unspecified vomiting type: abdominal exam with mild tenderness around the bellybutton otherwise benign. CMP and lipase within normal except for mild elevated creatinine to 1.23. At this point, I have no clear explanation for her symptoms but there are multiple potential etiologies including medication side effects, her comorbidity, viral or constipation. Daughter states viral illness in the family. She lost about 12 lbs in the last one month although on different scale. Reports remote colonoscopy over 10 years ago  but no results in chart. She has no other constitutional symptoms. Recommended good hydration. May consider imaging or referral to GI  if no improvement when she returns.   3. Cough: likely due to viral illness. Multiple family members with viral illness recently.  Recommended a table spoonful of honey and good hydration. Cardiopulmonary exam within normal limit.   Return in about 2 weeks (around 09/04/2017) for Lightheadedness/bradycardia.  Mercy Riding, MD 08/23/17 Pager: [phone]

## 2017-08-21 NOTE — Patient Instructions (Signed)
It was great seeing you today! We have addressed the following issues today  Lightheadedness: This could be due to your low heart rate.  We recommend taking your propranolol every other day.  Keep yourself hydrated.  Get up from sitting or lying in stepwise manner slowly.  Vomiting and abdominal pain: this could be due to viral infection.  It could also be due to medications.  I suggest good hydration.  Please come back and see [place] if you have worsening of symptoms.  Cough: you may try a tablespoonful of honey before bedtime.   If we did any lab work today, and the results require attention, either me or my nurse will get in touch with you. If everything is normal, you will get a letter in mail and a message via . If you don't hear from [place] in two weeks, please give [place] a call. Otherwise, we look forward to seeing you again at your next visit. If you have any questions or concerns before then, please call the clinic at [phone].  Please bring all your medications to every doctors visit  Sign up for My Chart to have easy access to your labs results, and communication with your Primary care physician.    Please check-out at the front desk before leaving the clinic.    Take Care,   Dr. Cyndia Skeeters

## 2017-08-23 ENCOUNTER — Encounter: Payer: Self-pay | Admitting: Student

## 2017-08-23 DIAGNOSIS — R059 Cough, unspecified: Secondary | ICD-10-CM | POA: Insufficient documentation

## 2017-08-23 DIAGNOSIS — R05 Cough: Secondary | ICD-10-CM | POA: Insufficient documentation

## 2017-08-24 DIAGNOSIS — N390 Urinary tract infection, site not specified: Secondary | ICD-10-CM | POA: Diagnosis not present

## 2017-08-24 DIAGNOSIS — N183 Chronic kidney disease, stage 3 (moderate): Secondary | ICD-10-CM | POA: Diagnosis not present

## 2017-08-24 DIAGNOSIS — I129 Hypertensive chronic kidney disease with stage 1 through stage 4 chronic kidney disease, or unspecified chronic kidney disease: Secondary | ICD-10-CM | POA: Diagnosis not present

## 2017-08-24 DIAGNOSIS — M48061 Spinal stenosis, lumbar region without neurogenic claudication: Secondary | ICD-10-CM | POA: Diagnosis not present

## 2017-08-24 DIAGNOSIS — K210 Gastro-esophageal reflux disease with esophagitis: Secondary | ICD-10-CM | POA: Diagnosis not present

## 2017-08-24 DIAGNOSIS — M170 Bilateral primary osteoarthritis of knee: Secondary | ICD-10-CM | POA: Diagnosis not present

## 2017-08-24 DIAGNOSIS — R1310 Dysphagia, unspecified: Secondary | ICD-10-CM | POA: Diagnosis not present

## 2017-08-24 DIAGNOSIS — G894 Chronic pain syndrome: Secondary | ICD-10-CM | POA: Diagnosis not present

## 2017-08-24 DIAGNOSIS — G20 Parkinson's disease: Secondary | ICD-10-CM | POA: Diagnosis not present

## 2017-08-24 NOTE — Telephone Encounter (Signed)
I do not Rx her temazepam, her psychiatrist does. I have no control over that. I HAVE told her and her daughter that I think that is not a good medicine for her. [name]

## 2017-08-25 ENCOUNTER — Ambulatory Visit: Payer: Self-pay | Admitting: Pharmacist

## 2017-08-25 ENCOUNTER — Other Ambulatory Visit: Payer: Self-pay | Admitting: Pharmacist

## 2017-08-25 NOTE — Patient Outreach (Signed)
[facility]) Care Management  08/25/2017  ADELLA MANOLIS 05-07-1945 [id number]  Per review of [facility], propranolol changed to QOD at last provider visit on 08/21/2017.  Successful call placed to Ms. Edward's daughter, [name].  HIPAA identifiers verified. Daughter reports that patient is doing better than last week and participated in a PT session yesterday as she had more energy.  Patient has stopped taking temazepam.  She is aware of dose change of propranolol and reports she updated pillbottle instructions on label.  Daughter reports that her step-dad, Letitia Libra, is using the pillbox that I left with them.  She denies any further medication questions at this time.  I provided my phone number if she would like to reach out to me in the future.   Successful call placed to Ms. Mccaughey.  HIPAA identifiers verified. Patient reports that she is feeling better and that the pillbox seems to be working fine.  She is aware also of propranolol dose change.  She denies needing any further medication management at this time.   Plan: I will close patient case as family and patient report that they are doing well with pillbox and are taking medications appropriately.  I am happy to assist in the future as needed.    Ralene Bathe, PharmD, [place] [phone]

## 2017-08-26 DIAGNOSIS — M170 Bilateral primary osteoarthritis of knee: Secondary | ICD-10-CM | POA: Diagnosis not present

## 2017-08-26 DIAGNOSIS — G20 Parkinson's disease: Secondary | ICD-10-CM | POA: Diagnosis not present

## 2017-08-26 DIAGNOSIS — K210 Gastro-esophageal reflux disease with esophagitis: Secondary | ICD-10-CM | POA: Diagnosis not present

## 2017-08-26 DIAGNOSIS — I129 Hypertensive chronic kidney disease with stage 1 through stage 4 chronic kidney disease, or unspecified chronic kidney disease: Secondary | ICD-10-CM | POA: Diagnosis not present

## 2017-08-26 DIAGNOSIS — R1310 Dysphagia, unspecified: Secondary | ICD-10-CM | POA: Diagnosis not present

## 2017-08-26 DIAGNOSIS — N183 Chronic kidney disease, stage 3 (moderate): Secondary | ICD-10-CM | POA: Diagnosis not present

## 2017-08-26 DIAGNOSIS — M48061 Spinal stenosis, lumbar region without neurogenic claudication: Secondary | ICD-10-CM | POA: Diagnosis not present

## 2017-08-26 DIAGNOSIS — G894 Chronic pain syndrome: Secondary | ICD-10-CM | POA: Diagnosis not present

## 2017-08-26 DIAGNOSIS — N390 Urinary tract infection, site not specified: Secondary | ICD-10-CM | POA: Diagnosis not present

## 2017-08-27 ENCOUNTER — Ambulatory Visit: Payer: Self-pay

## 2017-09-01 ENCOUNTER — Other Ambulatory Visit: Payer: Self-pay

## 2017-09-01 DIAGNOSIS — M170 Bilateral primary osteoarthritis of knee: Secondary | ICD-10-CM | POA: Diagnosis not present

## 2017-09-01 DIAGNOSIS — N390 Urinary tract infection, site not specified: Secondary | ICD-10-CM | POA: Diagnosis not present

## 2017-09-01 DIAGNOSIS — K210 Gastro-esophageal reflux disease with esophagitis: Secondary | ICD-10-CM | POA: Diagnosis not present

## 2017-09-01 DIAGNOSIS — R1310 Dysphagia, unspecified: Secondary | ICD-10-CM | POA: Diagnosis not present

## 2017-09-01 DIAGNOSIS — G894 Chronic pain syndrome: Secondary | ICD-10-CM | POA: Diagnosis not present

## 2017-09-01 DIAGNOSIS — I129 Hypertensive chronic kidney disease with stage 1 through stage 4 chronic kidney disease, or unspecified chronic kidney disease: Secondary | ICD-10-CM | POA: Diagnosis not present

## 2017-09-01 DIAGNOSIS — G20 Parkinson's disease: Secondary | ICD-10-CM | POA: Diagnosis not present

## 2017-09-01 DIAGNOSIS — M48061 Spinal stenosis, lumbar region without neurogenic claudication: Secondary | ICD-10-CM | POA: Diagnosis not present

## 2017-09-01 DIAGNOSIS — N183 Chronic kidney disease, stage 3 (moderate): Secondary | ICD-10-CM | POA: Diagnosis not present

## 2017-09-01 NOTE — Patient Outreach (Signed)
[place] [facility]) Care Management  09/01/2017  MARIGOLD MOM 12/13/1945 [id number]   Telephone assessment; Referral date 08/05/17 Referral source; [facility] hospital [place] Insurance: [employer] Attempt #1  Telephone call to patient regarding utilization management referral. Unable to reach patient. HIPAA compliant voice message left with call back phone number.   PLAN: RNCM will attempt 2nd telephone call to patient within 4 business days. RNCM will send outreach letter.   Quinn Plowman RN,BSN,CCM [facility] Telephonic  [phone]

## 2017-09-04 ENCOUNTER — Telehealth: Payer: Self-pay | Admitting: Family Medicine

## 2017-09-04 NOTE — Telephone Encounter (Signed)
Upon review of chart saw that a Pharmacist from [facility] came out.  Number given to contact her to husband, he will relay message. [name], Salome Spotted, CMA

## 2017-09-04 NOTE — Telephone Encounter (Signed)
Pt called requesting to speak to a pharmacist. She said our office sent a pharmacist out to her house last week and she would like to speak to that person, but she doesn't know their name or number. Please call pt back.

## 2017-09-08 DIAGNOSIS — R1310 Dysphagia, unspecified: Secondary | ICD-10-CM | POA: Diagnosis not present

## 2017-09-08 DIAGNOSIS — M48061 Spinal stenosis, lumbar region without neurogenic claudication: Secondary | ICD-10-CM | POA: Diagnosis not present

## 2017-09-08 DIAGNOSIS — N183 Chronic kidney disease, stage 3 (moderate): Secondary | ICD-10-CM | POA: Diagnosis not present

## 2017-09-08 DIAGNOSIS — K210 Gastro-esophageal reflux disease with esophagitis: Secondary | ICD-10-CM | POA: Diagnosis not present

## 2017-09-08 DIAGNOSIS — G20 Parkinson's disease: Secondary | ICD-10-CM | POA: Diagnosis not present

## 2017-09-08 DIAGNOSIS — G894 Chronic pain syndrome: Secondary | ICD-10-CM | POA: Diagnosis not present

## 2017-09-08 DIAGNOSIS — I129 Hypertensive chronic kidney disease with stage 1 through stage 4 chronic kidney disease, or unspecified chronic kidney disease: Secondary | ICD-10-CM | POA: Diagnosis not present

## 2017-09-08 DIAGNOSIS — M170 Bilateral primary osteoarthritis of knee: Secondary | ICD-10-CM | POA: Diagnosis not present

## 2017-09-08 DIAGNOSIS — N390 Urinary tract infection, site not specified: Secondary | ICD-10-CM | POA: Diagnosis not present

## 2017-09-09 ENCOUNTER — Other Ambulatory Visit: Payer: Self-pay

## 2017-09-09 NOTE — Patient Outreach (Signed)
[facility]) Care Management  09/09/2017  CYSTAL SHANNAHAN [birth year] [id number]   Initial Telephone assessment; Referral source; [facility] hospital [place] Insurance: [facility]  Telephone call to patient regarding initial telephone assessment. HIPAA verified by patient. Patient states she is doing a little better. Patient reports seeing her gastroenterologist within the past couple of weeks.   States she is not having as much trouble swallowing. Patient states she sees her gastroenterologist again on 10/09/17 for a follow up visit.   Patient states she will have an EGD on 10/23/17.  Patient states the home health physical therapist discharged her this week. Sttates she still has an aid 2 days per week.  Patient reports her she has been checking her blood sugars a couple of times per week. Patient states her doctor does not have her taking any diabetic medication.   Patient reports she is getting tired and requested call back from [facility] another day to complete assessment.   PLAN; RNCM will attempt call to patient within 4 business days.   Quinn Plowman RN,BSN,CCM [facility] Telephonic  [phone]

## 2017-09-10 ENCOUNTER — Ambulatory Visit: Payer: Self-pay

## 2017-09-10 ENCOUNTER — Telehealth: Payer: Self-pay

## 2017-09-10 NOTE — Telephone Encounter (Signed)
Faxed orders received from [place] for [place] and Plan of Care. Orders placed in PCP's box for signature. Please return to nurse clinic when complete.  Danley Danker, RN [facility] RN)

## 2017-09-11 ENCOUNTER — Other Ambulatory Visit: Payer: Self-pay

## 2017-09-11 NOTE — Patient Outreach (Signed)
[place] [facility]) Care Management  09/11/2017   [name] Dec 26, 1945 [id number]    Complete Initial Telephone assessment; Referral source; [facility] hospital [place] Insurance: [facility]  Telephone call to patient to complete initial telephone assessment. HIPAA verified with patient. Patient verbally agreed to complete assessment with RNCM. Patient states she is eating better. Reports she is gaining back some of the  weight she lost.Patient reports she receives Meals on wheels.   Patient states she still does not feel like getting out much and does not like to go out by herself.  Patient reports she has a follow up appointment with the gastroenterologist the end of June 2019.  Unsure of exact date at time of call.  Patient reports her husband checks her blood pressure 1-2 times per week. Patient states they are not recording the blood pressures. RNCM advised patient to start recording blood pressure readings.  Advised that blood pressure readings should be taken to her appointments for her doctor to see. Patient verbalized understanding.  Objective: see assessment  Current Medications:  Current Outpatient Medications  Medication Sig Dispense Refill  . carbidopa-levodopa (SINEMET IR) 25-100 MG tablet Take 1 tablet by mouth 5 (five) times daily. 150 tablet 12  . fluticasone (FLONASE) 50 MCG/ACT nasal spray Place 1 spray into both nostrils daily as needed for allergies or rhinitis.    Marland Kitchen gabapentin (NEURONTIN) 300 MG capsule Take 300-600 mg by mouth See admin instructions. Take  2 capsules in the morning and 1 capsule in the evening as needed    . omeprazole (PRILOSEC) 40 MG capsule TAKE 1 CAPSULE BY MOUTH TWICE DAILY 90 capsule 3  . propranolol ER (INDERAL LA) 60 MG 24 hr capsule Take 1 capsule (60 mg total) by mouth every other day. 45 capsule 1  . sertraline (ZOLOFT) 100 MG tablet Take 100 mg by mouth daily. Per psych    . sucralfate (CARAFATE) 1 GM/10ML  suspension Take 10 mLs (1 g total) by mouth 4 (four) times daily -  with meals and at bedtime. (Patient not taking: Reported on 09/09/2017) 420 mL 0  . temazepam (RESTORIL) 15 MG capsule Take 30 mg by mouth at bedtime as needed for sleep.    . traMADol (ULTRAM) 50 MG tablet TAKE 1 TO 2 TABLETS BY MOUTH TWICE DAILY AS NEEDED FOR PAIN 120 tablet 0   No current facility-administered medications for this visit.     Functional Status:  In your present state of health, do you have any difficulty performing the following activities: 09/09/2017 08/06/2017  Hearing? N Y  Comment - "just related to my age"  Vision? N Y  Comment - "sometimes"  Difficulty concentrating or making decisions? N Y  Comment - "sometimes"  Walking or climbing stairs? Y Y  Dressing or bathing? Y Y  Doing errands, shopping? [place] and eating ? N -  Using the Toilet? Y -  In the past six months, have you accidently leaked urine? Y -  Do you have problems with loss of bowel control? Y -  Managing your Medications? N -  Managing your Finances? Y -  Housekeeping or managing your Housekeeping? Y -  Some recent data might be hidden    Fall/Depression Screening: Fall Risk  09/09/2017 08/20/2017 08/12/2017  Falls in the past year? Yes No Yes  Comment - - -  Number falls in past yr: 2 or more - 1  Comment - - -  Injury with Fall? Yes - (No Data)  Comment - - bump and bruises  Risk Factor Category  High Fall Risk - -  Risk for fall due to : History of fall(s) - -  Follow up Falls prevention discussed - -   PHQ 2/9 Scores 09/09/2017 08/20/2017 08/12/2017 07/29/2017 07/28/2017 01/06/2017 12/17/2016  PHQ - 2 Score 3 0 0 0 0 0 0  PHQ- 9 Score 10 - - - - - -  Exception Documentation - - - - - - -  Not completed - - - - - - -      Plan: RNCM will follow up with patient within the month of June 2019  Quinn Plowman RN,BSN,CCM [facility] Telephonic  [phone]

## 2017-09-12 ENCOUNTER — Other Ambulatory Visit: Payer: Self-pay | Admitting: Family Medicine

## 2017-09-16 NOTE — Telephone Encounter (Signed)
Sign HH Certification and POC faxed to [facility] at [phone]. Danley Danker, RN [facility] RN)

## 2017-09-29 ENCOUNTER — Ambulatory Visit: Payer: Self-pay

## 2017-10-02 ENCOUNTER — Ambulatory Visit: Payer: Self-pay

## 2017-10-09 ENCOUNTER — Ambulatory Visit (AMBULATORY_SURGERY_CENTER): Payer: Self-pay

## 2017-10-09 VITALS — Ht 64.0 in | Wt 174.6 lb

## 2017-10-09 DIAGNOSIS — K209 Esophagitis, unspecified without bleeding: Secondary | ICD-10-CM

## 2017-10-09 NOTE — Progress Notes (Signed)
Denies allergies to eggs or soy products. Denies complication of anesthesia or sedation. Denies use of weight loss medication. Denies use of O2.   Emmi instructions declined.  

## 2017-10-12 ENCOUNTER — Encounter: Payer: Self-pay | Admitting: Internal Medicine

## 2017-10-15 ENCOUNTER — Other Ambulatory Visit: Payer: Self-pay | Admitting: Family Medicine

## 2017-10-19 ENCOUNTER — Other Ambulatory Visit: Payer: Self-pay

## 2017-10-19 NOTE — Patient Outreach (Signed)
[place] [facility]) Care Management  10/19/2017  [name] 1945/05/25 [id number]   Complete InitialTelephone assessment; Referral source; [facility] hospital [place] Insurance: [employer] Attempt #1  Telephone call to patient regarding assessment follow up. Unable to reach patient. HIPAA compliant voice message left with call back phone number.   PLAN: RNCM will attempt 2nd telephone call to patient within 4 business days. RNCM will send outreach letter.   Quinn Plowman RN,BSN,CCM [facility] Telephonic  [phone]

## 2017-10-23 ENCOUNTER — Other Ambulatory Visit: Payer: Self-pay

## 2017-10-23 ENCOUNTER — Encounter: Payer: Self-pay | Admitting: Internal Medicine

## 2017-10-23 ENCOUNTER — Ambulatory Visit (AMBULATORY_SURGERY_CENTER): Payer: Medicare HMO | Admitting: Internal Medicine

## 2017-10-23 VITALS — BP 133/78 | HR 62 | Temp 97.7°F | Resp 14 | Ht 64.0 in | Wt 174.0 lb

## 2017-10-23 DIAGNOSIS — K209 Esophagitis, unspecified without bleeding: Secondary | ICD-10-CM

## 2017-10-23 DIAGNOSIS — I10 Essential (primary) hypertension: Secondary | ICD-10-CM | POA: Diagnosis not present

## 2017-10-23 DIAGNOSIS — E119 Type 2 diabetes mellitus without complications: Secondary | ICD-10-CM | POA: Diagnosis not present

## 2017-10-23 DIAGNOSIS — R1310 Dysphagia, unspecified: Secondary | ICD-10-CM | POA: Diagnosis not present

## 2017-10-23 DIAGNOSIS — K219 Gastro-esophageal reflux disease without esophagitis: Secondary | ICD-10-CM | POA: Diagnosis not present

## 2017-10-23 MED ORDER — SODIUM CHLORIDE 0.9 % IV SOLN: 500.0000 mL | Freq: Once | INTRAVENOUS | Status: DC

## 2017-10-23 NOTE — Op Note (Signed)
[place] [name] Procedure Date: 10/23/2017 10:16 AM MRN: [id number] Endoscopist: Jerene Bears , MD Age: 72 Referring MD:  Date of Birth: 24-Sep-1945 Gender: Female Account #: [id number] Procedure:                Upper GI endoscopy Indications:              Follow-up of acute ulcerative esophagitis found in                            April 2019 as result of hospitalization for                            melena/UGI bleeding; patient reports improvement in                            symptoms, no further melena and denies dysphagia Medicines:                Monitored Anesthesia Care Procedure:                Pre-Anesthesia Assessment:                           - Prior to the procedure, a History and Physical                            was performed, and patient medications and                            allergies were reviewed. The patient's tolerance of                            previous anesthesia was also reviewed. The risks                            and benefits of the procedure and the sedation                            options and risks were discussed with the patient.                            All questions were answered, and informed consent                            was obtained. Prior Anticoagulants: The patient has                            taken no previous anticoagulant or antiplatelet                            agents. ASA Grade Assessment: III - A patient with                            severe systemic disease. After reviewing the risks  and benefits, the patient was deemed in                            satisfactory condition to undergo the procedure.                           After obtaining informed consent, the endoscope was                            passed under direct vision. Throughout the                            procedure, the patient's blood pressure, pulse, and                            oxygen  saturations were monitored continuously. The                            Endoscope was introduced through the mouth, and                            advanced to the second part of duodenum. The upper                            GI endoscopy was accomplished without difficulty.                            The patient tolerated the procedure well. Scope In: Scope Out: Findings:                 LA Grade B (one or more mucosal breaks greater than                            5 mm, not extending between the tops of two mucosal                            folds) esophagitis with no bleeding was found 33 to                            36 cm from the incisors. Biopsies were taken with a                            cold forceps for histology. The previously seen                            ulceration has healed and overall esophagitis is                            significantly improved.                           A 3-4 cm hiatal hernia was present (36-39/40 cm  from the incisors).                           The entire examined stomach was normal.                           The examined duodenum was normal. Complications:            No immediate complications. Estimated Blood Loss:     Estimated blood loss was minimal. Impression:               - Improved, and now mild reflux esophagitis.                            Biopsied.                           - 3 to 4 cm hiatal hernia.                           - Normal stomach.                           - Normal examined duodenum. Recommendation:           - Patient has a contact number available for                            emergencies. The signs and symptoms of potential                            delayed complications were discussed with the                            patient. Return to normal activities tomorrow.                            Written discharge instructions were provided to the                            patient.                            - Resume previous diet.                           - Continue present medications.                           - Continue omeprazole 40 mg twice daily before 1st                            and last meal of the day for acid reflux and                            esophagitis.                           -  Await pathology results. Jerene Bears, MD 10/23/2017 10:41:22 AM This report has been signed electronically.

## 2017-10-23 NOTE — Progress Notes (Signed)
Called to room to assist during endoscopic procedure.  Patient ID and intended procedure confirmed with present staff. Received instructions for my participation in the procedure from the performing physician.  

## 2017-10-23 NOTE — Progress Notes (Signed)
Pt's states no medical or surgical changes since previsit or office visit. 

## 2017-10-23 NOTE — Progress Notes (Signed)
Report given to PACU, vss 

## 2017-10-23 NOTE — Patient Instructions (Signed)
Healing per MD.  YOU HAD AN ENDOSCOPIC PROCEDURE TODAY AT [facility]:   Refer to the procedure report that was given to you for any specific questions about what was found during the examination.  If the procedure report does not answer your questions, please call your gastroenterologist to clarify.  If you requested that your care partner not be given the details of your procedure findings, then the procedure report has been included in a sealed envelope for you to review at your convenience later.  YOU SHOULD EXPECT: Some feelings of bloating in the abdomen. Passage of more gas than usual.  Walking can help get rid of the air that was put into your GI tract during the procedure and reduce the bloating. If you had a lower endoscopy (such as a colonoscopy or flexible sigmoidoscopy) you may notice spotting of blood in your stool or on the toilet paper. If you underwent a bowel prep for your procedure, you may not have a normal bowel movement for a few days.  Please Note:  You might notice some irritation and congestion in your nose or some drainage.  This is from the oxygen used during your procedure.  There is no need for concern and it should clear up in a day or so.  SYMPTOMS TO REPORT IMMEDIATELY    Vomiting of blood or coffee ground material  New chest pain or pain under the shoulder blades  Painful or persistently difficult swallowing  New shortness of breath  Fever of 100F or higher  Black, tarry-looking stools  For urgent or emergent issues, a gastroenterologist can be reached at any hour by calling [phone].   DIET:  We do recommend a small meal at first, but then you may proceed to your regular diet.  Drink plenty of fluids but you should avoid alcoholic beverages for 24 hours.  ACTIVITY:  You should plan to take it easy for the rest of today and you should NOT DRIVE or use heavy machinery until tomorrow (because of the sedation medicines used during the test).     FOLLOW UP: Our staff will call the number listed on your records the next business day following your procedure to check on you and address any questions or concerns that you may have regarding the information given to you following your procedure. If we do not reach you, we will leave a message.  However, if you are feeling well and you are not experiencing any problems, there is no need to return our call.  We will assume that you have returned to your regular daily activities without incident.  If any biopsies were taken you will be contacted by phone or by letter within the next 1-3 weeks.  Please call us at [phone] if you have not heard about the biopsies in 3 weeks.    SIGNATURES/CONFIDENTIALITY: You and/or your care partner have signed paperwork which will be entered into your electronic medical record.  These signatures attest to the fact that that the information above on your After Visit Summary has been reviewed and is understood.  Full responsibility of the confidentiality of this discharge information lies with you and/or your care-partner.

## 2017-10-23 NOTE — Patient Outreach (Signed)
[place] [facility]) Care Management  10/23/2017  [name] [birth year] [id number]  Telephone assessment; Referral date 08/05/17 Referral source; [facility] [place] Insurance: [employer] Attempt #2  Telephone call to patient regarding assessment follow up. Contact answering phone states patient is at the hospital having a test done. HIPAA compliant message left with call back phone number.   PLAN; RNCm will attempt 3rd telephone call within 4 business days.   Quinn Plowman RN,BSN,CCM [facility] Telephonic  [phone]

## 2017-10-26 ENCOUNTER — Telehealth: Payer: Self-pay

## 2017-10-26 ENCOUNTER — Telehealth: Payer: Self-pay | Admitting: *Deleted

## 2017-10-26 NOTE — Telephone Encounter (Signed)
No answer for post procedure call back and no voicemail available to leave message/ Sm

## 2017-10-26 NOTE — Telephone Encounter (Signed)
Called [phone] and the phone rang numerous times.  No voice mail picked up.  Unable to leave a message for follow up call this am.  Will try to call again this afternoon. maw

## 2017-10-28 ENCOUNTER — Telehealth: Payer: Self-pay | Admitting: *Deleted

## 2017-10-28 NOTE — Telephone Encounter (Signed)
-----   Message from Jerene Bears, MD sent at 10/23/2017 10:49 AM EDT ----- cologuard Screening JMP

## 2017-10-28 NOTE — Telephone Encounter (Signed)
Cologuard forms sent to [employer].

## 2017-10-29 ENCOUNTER — Other Ambulatory Visit: Payer: Self-pay

## 2017-10-29 ENCOUNTER — Telehealth: Payer: Self-pay | Admitting: Diagnostic Neuroimaging

## 2017-10-29 NOTE — Telephone Encounter (Signed)
Pt is having changes in her parkinsons but would not say what they were. She is wanting to be seen asap. Jinny Blossom is booked into January. Is there a time next week she could come in?
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
## 2017-10-29 NOTE — Telephone Encounter (Signed)
Parkinson's dz and parkinson's meds can cause hallucinations. We can try nuplazid to help. -VRP

## 2017-10-29 NOTE — Patient Outreach (Signed)
[place] [facility]) Care Management  10/29/2017  [name] December 12, 1945 [id number]  [name] 04-29-1945 [id number]  Telephone assessment; Referral date 08/05/17 Referral source; [facility] hospital [place] Insurance: [employer] Attempt #3  Telephone call to patient regarding assessment follow up. Contact answering phone states patient is at the hospital having a test done. HIPAA compliant message left with call back phone number.   PLAN; RNCM will attempt patient again.  If no return call will proceed with closure.   Quinn Plowman RN,BSN,CCM [facility] Telephonic  [phone]

## 2017-10-29 NOTE — Telephone Encounter (Signed)
Pt returned RN's call °

## 2017-10-29 NOTE — Telephone Encounter (Signed)
LMVM x2 that was returning her call.

## 2017-10-29 NOTE — Telephone Encounter (Signed)
LMVM for pt to return call.   

## 2017-10-30 ENCOUNTER — Encounter: Payer: Self-pay | Admitting: Internal Medicine

## 2017-10-30 NOTE — Telephone Encounter (Signed)
error 

## 2017-10-30 NOTE — Telephone Encounter (Signed)
I called pt. I explained to her that hallucinations can be caused by both PD and PD medications. Dr. Leta Baptist recommends trying pt on nuplazid to assist with this hallucinations. There is a start form that needs to be filled out prior to starting nuplazid. Pt reports that she would like to come in for an office visit to discuss this medication and fill out the start form. Pt says "it's time" for a new medication. An appt was scheduled with pt for 11/02/17 at 1:00pm, check in at 12:30pm, with Jinny Blossom, NP. Pt verbalized understanding of appt date and time.

## 2017-11-02 ENCOUNTER — Ambulatory Visit: Payer: Self-pay | Admitting: Adult Health

## 2017-11-03 ENCOUNTER — Ambulatory Visit: Payer: Self-pay

## 2017-11-03 DIAGNOSIS — F3341 Major depressive disorder, recurrent, in partial remission: Secondary | ICD-10-CM | POA: Diagnosis not present

## 2017-11-04 ENCOUNTER — Other Ambulatory Visit: Payer: Self-pay

## 2017-11-04 ENCOUNTER — Encounter: Payer: Self-pay | Admitting: Adult Health

## 2017-11-04 NOTE — Patient Outreach (Signed)
[place] [facility]) Care Management  11/04/2017  [name] 06-12-1945 [id number]   Telephone assessment; Referral date 08/05/17 Referral source; [facility] hospital [place] Insurance: [employer] Attempt #4  Telephone call to patient regarding assessment follow up. Contact states patient is not available. HIPAA compliant message left with call back phone number.   PLAN; Unable to reach patient and no response from patient after several phone attempts. RNCm will close patient due to being unable to reach.  RNCM will send patient and primary MD closure letter   Quinn Plowman RN,BSN,CCM [facility] Telephonic  [phone]

## 2017-11-12 DIAGNOSIS — F3341 Major depressive disorder, recurrent, in partial remission: Secondary | ICD-10-CM | POA: Diagnosis not present

## 2017-11-13 ENCOUNTER — Other Ambulatory Visit: Payer: Self-pay

## 2017-12-22 DIAGNOSIS — F3341 Major depressive disorder, recurrent, in partial remission: Secondary | ICD-10-CM | POA: Diagnosis not present

## 2018-01-13 ENCOUNTER — Encounter: Payer: Self-pay | Admitting: Family Medicine

## 2018-01-13 ENCOUNTER — Other Ambulatory Visit: Payer: Self-pay

## 2018-01-13 ENCOUNTER — Ambulatory Visit (INDEPENDENT_AMBULATORY_CARE_PROVIDER_SITE_OTHER): Payer: Medicare HMO | Admitting: Family Medicine

## 2018-01-13 VITALS — BP 128/62 | HR 56 | Temp 98.5°F | Ht 64.0 in | Wt 179.0 lb

## 2018-01-13 DIAGNOSIS — M25569 Pain in unspecified knee: Secondary | ICD-10-CM

## 2018-01-13 DIAGNOSIS — M159 Polyosteoarthritis, unspecified: Secondary | ICD-10-CM

## 2018-01-13 DIAGNOSIS — Z23 Encounter for immunization: Secondary | ICD-10-CM

## 2018-01-13 DIAGNOSIS — N183 Chronic kidney disease, stage 3 unspecified: Secondary | ICD-10-CM

## 2018-01-13 DIAGNOSIS — M25512 Pain in left shoulder: Secondary | ICD-10-CM

## 2018-01-13 DIAGNOSIS — E538 Deficiency of other specified B group vitamins: Secondary | ICD-10-CM | POA: Diagnosis not present

## 2018-01-13 DIAGNOSIS — I10 Essential (primary) hypertension: Secondary | ICD-10-CM | POA: Diagnosis not present

## 2018-01-13 DIAGNOSIS — E1122 Type 2 diabetes mellitus with diabetic chronic kidney disease: Secondary | ICD-10-CM

## 2018-01-13 DIAGNOSIS — G20 Parkinson's disease: Secondary | ICD-10-CM

## 2018-01-13 DIAGNOSIS — M150 Primary generalized (osteo)arthritis: Secondary | ICD-10-CM | POA: Diagnosis not present

## 2018-01-13 DIAGNOSIS — M8949 Other hypertrophic osteoarthropathy, multiple sites: Secondary | ICD-10-CM

## 2018-01-13 DIAGNOSIS — G20A1 Parkinson's disease without dyskinesia, without mention of fluctuations: Secondary | ICD-10-CM

## 2018-01-13 MED ORDER — CYANOCOBALAMIN 1000 MCG/ML IJ SOLN
1000.0000 ug | Freq: Once | INTRAMUSCULAR | Status: AC
  Administered 2018-01-13: 1000 ug via INTRAMUSCULAR

## 2018-01-13 MED ORDER — METHYLPREDNISOLONE ACETATE 40 MG/ML IJ SUSP
40.0000 mg | Freq: Once | INTRAMUSCULAR | Status: AC
  Administered 2018-01-13: 40 mg via INTRAMUSCULAR

## 2018-01-13 NOTE — Patient Instructions (Signed)
I will send you a note about your labs. Hope the injection helps your shoulder.

## 2018-01-14 ENCOUNTER — Encounter: Payer: Self-pay | Admitting: Family Medicine

## 2018-01-14 LAB — COMPREHENSIVE METABOLIC PANEL
ALT: 6 IU/L (ref 0–32)
AST: 17 IU/L (ref 0–40)
Albumin/Globulin Ratio: 1.8 (ref 1.2–2.2)
Albumin: 4.5 g/dL (ref 3.5–4.8)
Alkaline Phosphatase: 90 IU/L (ref 39–117)
BUN/Creatinine Ratio: 16 (ref 12–28)
BUN: 18 mg/dL (ref 8–27)
Bilirubin Total: 0.3 mg/dL (ref 0.0–1.2)
CO2: 25 mmol/L (ref 20–29)
Calcium: 9.9 mg/dL (ref 8.7–10.3)
Chloride: 102 mmol/L (ref 96–106)
Creatinine, Ser: 1.1 mg/dL — ABNORMAL HIGH (ref 0.57–1.00)
GFR calc Af Amer: 58 mL/min/{1.73_m2} — ABNORMAL LOW (ref >59)
GFR calc non Af Amer: 50 mL/min/{1.73_m2} — ABNORMAL LOW (ref >59)
Globulin, Total: 2.5 g/dL (ref 1.5–4.5)
Glucose: 102 mg/dL — ABNORMAL HIGH (ref 65–99)
Potassium: 4.6 mmol/L (ref 3.5–5.2)
Sodium: 141 mmol/L (ref 134–144)
Total Protein: 7 g/dL (ref 6.0–8.5)

## 2018-01-14 LAB — CBC
Hematocrit: 36.2 % (ref 34.0–46.6)
Hemoglobin: 11.9 g/dL (ref 11.1–15.9)
MCH: 28.3 pg (ref 26.6–33.0)
MCHC: 32.9 g/dL (ref 31.5–35.7)
MCV: 86 fL (ref 79–97)
Platelets: 333 10*3/uL (ref 150–450)
RBC: 4.21 x10E6/uL (ref 3.77–5.28)
RDW: 16.3 % — ABNORMAL HIGH (ref 12.3–15.4)
WBC: 9.8 10*3/uL (ref 3.4–10.8)

## 2018-01-15 NOTE — Assessment & Plan Note (Signed)
Good control.  No medication changes.Good control.

## 2018-01-15 NOTE — Assessment & Plan Note (Signed)
CSI x2

## 2018-01-15 NOTE — Progress Notes (Signed)
CHIEF COMPLAINT / HPI: #1. Arthralgias1.  Arthralgias right knee pain left shoulder pain.right knee pain left shoulder pain. Aching in nature..  Pain In both areas. Knee pain with walking or standing.  No swelling.  No locking. Worse with walking or standing. No swelling. No locking. No giving way.  Pain Left shoulder pain. worse with any elevation above shoulder height. Really inhibiting her ability to do things like dressing take care of her hair. #2.Wants to get her B12 shot. 3.  Hypertension: #n:Taking her blood pressure medicines regularly without problem. 4.  Feels like her Parkinson's is relatively stable. 5.  she know she is due for blood work to follow her kidney function  REVIEW OF SYSTEMS: Continues to have intermittent problems with mostnContinues to have intermittent problems with muscle rigidity from her P[place]'s but not recently increased in symptoms. Still has some occasional baseline  Occasional baseline dizziness but that is also not increased. No fever. No unusual weight change. Sleep is baseline. No shortness of breath or chest pain.ki  PERTINENT  PMH / PSH: I have reviewed the patient's medications, allergies, past medical and surgical history, smoking status and updated in the EMR as appropriate.   OBJECTIVE: GEN.: Well-developed female no acute distress. LUNGS: Clear to auscultation bilaterally. Normal work of breathing  CV: Regular rate and rhythm extremity: EXTREMITY: Right knee: Right knee: Tender to palpation medial and lateral joint 1. Tender palpation medial and lateral joint line. She has full extension degrees of extension.  Is but pain with the last 20 of extension. Mild crepitus on extension. No effusion.  Shoulder Left shoulder full passive range of motion but active range of motion full passive range of motion but active range of motion in abduction degrees degrees. only to 110, forward flexion to 100.  Internal rotation Internal rotation painless.  External  rotation is painful External rotation 40 . Marland KitchenPSYCHIATRIC: Alert and oriented 4.  His questions. Asks and answers questions appropriately. NEURO: Oro-buccal facial dyskinesias quite prominent today.  Some muscle rigidity upper and lower extremities.. Some muscle rigidity noted in the upper and lower extremities.  PROCEDURE: INJECTION: Patient was given informed consent, signed copy in the chart. Appropriate time out was taken. Area prepped and draped in usual sterile fashion. Ethyl chloride was  used for local anesthesia. A 21 gauge 1 1/2 inch needle was used.. 1 cc of methylprednisolone 40 mg/ml plus  4 cc of 1% lidocaine without epinephrine was injected into the right knee joint  je using a(n) anterior  medial approach.   The patient tolerated the procedure well. There were no complications. Post procedure instructions were given.  PROCEDURE: INJECTION: Patient was given informed consent, signed copy in the chart. Appropriate time out was taken. Area prepped and draped in usual sterile fashion. Ethyl chloride was  used for local anesthesia. A 21 gauge 1 1/2 inch needle was used.. 1 cc of methylprednisolone 40 mg/ml plus   4 cc of 1% lidocaine without epinephrine was injected into the left subacromial bursa of the shoulder joint using posterior approach.  The patient tolerated the procedure well. There were no complications. Post procedure instructions were given.    ASSESSMENT / PLAN:  1.  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 Corticosteroid injection today 2.  Arthritis right knee.#2. Arthritis right knee.  Does not want to consider TKR on the right Does not want to consider TKR on the right even though the one on the left has done well. Corticosteroid injection today. :  3.. Parkinson's: She seems to be actually doing well fairly well. Will follow-up with her neurologist. nFor additional assessment and plan of remain problems please see problem based charting.

## 2018-01-15 NOTE — Assessment & Plan Note (Signed)
Oro-buccal dyskinesias seem somewhat worse as does her rigidity but her function is still pretty good. still pretty good.  One  episode of food sticking better taking small amounts of she is doing better taking small amounts of food

## 2018-01-15 NOTE — Assessment & Plan Note (Signed)
Check labs today.

## 2018-02-01 ENCOUNTER — Encounter: Payer: Self-pay | Admitting: Diagnostic Neuroimaging

## 2018-02-01 ENCOUNTER — Ambulatory Visit: Payer: Medicare HMO | Admitting: Diagnostic Neuroimaging

## 2018-02-01 VITALS — BP 110/82 | HR 84 | Ht 64.0 in | Wt 181.2 lb

## 2018-02-01 DIAGNOSIS — G249 Dystonia, unspecified: Secondary | ICD-10-CM

## 2018-02-01 DIAGNOSIS — G20 Parkinson's disease: Secondary | ICD-10-CM

## 2018-02-01 DIAGNOSIS — R269 Unspecified abnormalities of gait and mobility: Secondary | ICD-10-CM | POA: Diagnosis not present

## 2018-02-01 DIAGNOSIS — G20A1 Parkinson's disease without dyskinesia, without mention of fluctuations: Secondary | ICD-10-CM

## 2018-02-01 MED ORDER — CARBIDOPA-LEVODOPA 25-100 MG PO TABS: 1.0000 | ORAL_TABLET | Freq: Every day | ORAL | 12 refills | Status: DC

## 2018-02-01 NOTE — Progress Notes (Signed)
[facility]  PATIENT: [name] DOB: Aug 26, 1945  REFERRING CLINICIAN:  HISTORY FROM: patient and husband REASON FOR VISIT: follow up   HISTORICAL  CHIEF COMPLAINT:  Chief Complaint  Patient presents with  . Parkinson's disease    rm 109, husband- [name], "my neck gets stiff and hurts but I have arthritis; my little fingers get prickly"  . Follow-up    6 month    HISTORY OF PRESENT ILLNESS:   UPDATE (02/01/18, VRP): Since last visit, doing well. Symptoms are improving. Tolerating carb/levo. Dx'd with severe ulcerative reflux esophagitis. No alleviating or aggravating factors. Tolerating carb/levo 1 tab 5x per day.    UPDATE (07/27/17, VRP): Since last visit, doing worse with excessive movements, anxiety, falls. Tolerating carb/levo (2 tabs 8am, 2 tabs 6pm, 1 tab at 11pm). Now having more nausea, coffee ground emesis, and occ black stools. Some excessive movements, but not correlated with meds.  UPDATE (01/14/17, VRP): Since last visit, doing fair. More anger and rage issues, esp towards husband. Some nausea with carb/levo. Now on carb/levio 1.5 tabs TID, and better tremor control. No falls.  UPDATE 09/09/16 (VRP): Since last visit, no more falls. Now off any benzos or sleep aids. Using walker at times, but mainly walking stick. [facility] care mgmt has reached out to help patient. Now on carb/levo 1 / 1 / 0.5. Continues with nausea.   UPDATE 05/05/16 (MM): "She returns today after experiencing increased falls in the last 2 months. She states that some of the falls have been severe and she has fallen on her face. Fortunately she has not suffered any significant injuries. She does state that her left shoulder and neck is slightly tight after one of the falls. She is not using an assistive device. She states that she did purchase a walker last week. Her husband states that she tends to drag her feet when ambulating. Reports that she had physical therapy over a year  ago. She is currently taking Sinemet 25-100 milligrams one and a half tablets 3 times a day. She takes her first dose at 8:30, second dose at 3 PM and last dose at 9:30 PM. She states that she can tell a difference if she misses a dose. Denies any changes in her sleep. She states that if she does not chew her food up well she will become choked. Denies getting choked on liquids. Reports that she does have a tremor in the hands and mouth. She does note that she shuffles her feet when ambulating. She returns today for an evaluation."  UPDATE 03/12/16: Since last visit, continues to have falls; ~ 5 x in last 6 months. Not using cane or walker. Had episode of believing that she went to a party in [place], [place] (which she did not) and then told her family about it. No further delusions or hallucinations. Has been on lower carb/levo (1 tab twice a day) lately due to nausea.  UPDATE 09/03/15: Since last visit, stable. Did well with PT. Doing well with lower dose of carb/levo.   UPDATE 02/13/15: Since last visit, continues to have tremor, tongue and mouth movments (better), sweating, anxiety, inverted sleep schedule.   UPDATE 10/09/14: Since last visit, had increased carb/levo up to 2.5 tabs TID (even though I had recommended going up to 2 tabs TID; she's not sure why she did this). More tongue and mouth movements. More falls. Went to hospital for UTI, AKI, N/V/diarrhea In March 2016.   UPDATE 04/04/14: Since last  visit, she reports that she is stable from PD standpoint. Tremor and gait are stable. Tolerating carb/levo 1.5 tabs TID. Hasn't tried going up to 2 tabs TID. Re: swallowing issues, she reports remote esophagus surgery for swallow issues 15 yrs ago. Feels that this is a problem again, and wants to see GI. Re: rash, it turns out that they had bedbugs in their home; now problem is resolved. Struggling with diarrhea and excess sweating.  UPDATE 12/26/13: Since last visit, was hospitalized (2 weeks ago) for  delirium, hallucinations, chest pain, UTI, and medication overuse. Was taking ambien and a friend's xanax to help her sleep (was struggling with anxiety and insomnia; not seen Dr. Casimiro Needle in a while). Now back at home and doing better. Tremor and gait are worsening. Some wearing off (early AM and late evening). Also with 2 weeks are night time itching and rash. Going to see dermatology soon.   UPDATE 01/18/13: Since last visit, now on carb/levo 1.5 tabs TID. Nausea is improved since she started taking her metformin at different time than carb/levo. Tremor, balance, coordination are stable.  UPDATE 06/01/12: Since last visit, tried carb/levo 1.5 tabs TID x 1 week, then stopped. Felt like it was too much medicine. Did not have increased side effects. Has been struggling with nausea throughout. Tremor is persistent.  UPDATE 01/28/12: Doing well. No wearing off. Tolerating meds. Stopped azilect (per PCP for nausea). Now on fluoxetine for depression.  UPDATE 08/27/10: Doing better on carb/levo.  Taking 1 tab TID (6am, 4pm, 9pm).  Minimal nausea.  Wakes up with more tremor in AM, then reduction in tremor 73min after dose.  Effect wears off around 4pm.  Daughter reports some intermittent confusion.  PRIOR HPI (06/26/10): 72 yo Caucasian female referred to [place] for tremor with concern for possible Parkinson's disease. Ms. Kurihara notes she first noticed a resting tremor in her left hand about 3 years ago, and this has gotten progressively worse since then. It has also spread to now involve her mouth and right hand as well, though she notes it is still worst in her left hand. Stress and anxiety can accentuate the tremors, whereas active use can reduce them. She also notes the tremors being worse in the morning. She denies noting anything else that seems to make the tremors better or worse. She admits to what seems like possible bradykinesia, in her words that she has "a hard time getting going sometimes," but denies any  freezing. She does note some new difficulty with writing, but denies micrographia. She also admits to feeling like her balance and walking is "a bit off," but she relates this more to the osteoarthritis in her knees and having had surgery on her R knee. She denies orthostatic symptoms, hallucinations or delusions, difficulty standing or sitting, weakness, new visual changes (aside from her macular degeneration), or feelings of rigidity. She also denies bizarre dreams, nightmares, RLS symptoms, or REM behavior disorder symptoms. She admits to having two uncles who have tremors, etiology unclear, but adds that one uncle had alcoholism (and it was believed this was the cause). She is concerned about what is causing her tremors, and admits that although she initially "put off" being evaluated further, she is anxious to know what might be the cause of her tremors.   REVIEW OF SYSTEMS: Full 14 system review of systems performed and negative except for: only as per HPI.    ALLERGIES: Allergies  Allergen Reactions  . Ace Inhibitors Anaphylaxis and Swelling  .  Azilect [Rasagiline Mesylate] Other (See Comments)    hypotension  . Lisinopril Swelling    Severe facial angioedema requiring hospitalization 2007 (approx)    HOME MEDICATIONS: Outpatient Medications Prior to Visit  Medication Sig Dispense Refill  . carbidopa-levodopa (SINEMET IR) 25-100 MG tablet Take 1 tablet by mouth 5 (five) times daily. 150 tablet 12  . fluticasone (FLONASE) 50 MCG/ACT nasal spray Place 1 spray into both nostrils daily as needed for allergies or rhinitis.    Marland Kitchen gabapentin (NEURONTIN) 300 MG capsule Take 300-600 mg by mouth See admin instructions. Take  2 capsules in the morning and 1 capsule in the evening as needed    . omeprazole (PRILOSEC) 40 MG capsule TAKE 1 CAPSULE BY MOUTH TWICE DAILY 90 capsule 3  . propranolol ER (INDERAL LA) 60 MG 24 hr capsule Take 1 capsule (60 mg total) by mouth every other day. 45 capsule 1  .  sertraline (ZOLOFT) 100 MG tablet Take 100 mg by mouth daily. Per psych    . traMADol (ULTRAM) 50 MG tablet Take 1 to 2 tablets twice daily by mouth as needed for chronic knee pain 120 tablet 2  . doxepin (SINEQUAN) 25 MG capsule TK ONE C PO QHS  3  . temazepam (RESTORIL) 15 MG capsule Take 30 mg by mouth at bedtime as needed for sleep.    . fluticasone (FLONASE) 50 MCG/ACT nasal spray SHAKE LIQUID AND USE 2 SPRAYS IN EACH NOSTRIL DAILY (Patient not taking: Reported on 10/23/2017) 16 g 12   Facility-Administered Medications Prior to Visit  Medication Dose Route Frequency Provider Last Rate Last Dose  . 0.9 %  sodium chloride infusion  500 mL Intravenous Once Pyrtle, Lajuan Lines, MD        PAST MEDICAL HISTORY: Past Medical History:  Diagnosis Date  . Allergy   . Anxiety   . Arthritis    "knees, back" (06/29/2014)  . Cataract   . Chronic bronchitis ([place])    "get it q yr"  . Chronic mid back pain   . Cognitive impairment 12/11/2013  . Complication of anesthesia 07/2008   "hard to get me woke up when I had my knee replaced; they said they had to bring me back"  . Dementia ([place])    "I have some; not dx'd by a dr" (08/06/2017)  . Esophageal stricture   . GERD (gastroesophageal reflux disease)   . History of gout 1970's  . Hyperlipidemia   . Hypertension   . Movement disorder   . Neuromuscular disorder ([place])   . Osteoarthritis   . Parkinson's disease ([place])   . Scoliosis   . Situational depression   . Substance abuse ([place])   . Type II diabetes mellitus ([place])   . UTI (lower urinary tract infection)     PAST SURGICAL HISTORY: Past Surgical History:  Procedure Laterality Date  . ABDOMINAL HYSTERECTOMY    . CHOLECYSTECTOMY OPEN    . COLON SURGERY    . DILATION AND CURETTAGE OF UTERUS    . ESOPHAGOGASTRIC FUNDOPLASTY     some type "esoph surgery" per pt  . ESOPHAGOGASTRODUODENOSCOPY (EGD) WITH PROPOFOL N/A 08/05/2017   Procedure: ESOPHAGOGASTRODUODENOSCOPY (EGD) WITH PROPOFOL;  Surgeon:  Jerene Bears, MD;  Location: [facility];  Service: Gastroenterology;  Laterality: N/A;  . JOINT REPLACEMENT    . TOTAL KNEE ARTHROPLASTY Right 07/2008  . TUBAL LIGATION      FAMILY HISTORY: Family History  Problem Relation Age of Onset  . Heart disease Mother   .  Diabetes Mother   . Heart disease Father   . Heart attack Sister   . Heart disease Sister   . Heart attack Brother   . Heart disease Brother   . Cerebral palsy Son   . Colon cancer Neg Hx   . Esophageal cancer Neg Hx   . Liver cancer Neg Hx   . Pancreatic cancer Neg Hx   . Stomach cancer Neg Hx   . Rectal cancer Neg Hx     SOCIAL HISTORY:  Social History   Socioeconomic History  . Marital status: Married    Spouse name: [name]  . Number of children: 2  . Years of education: 66  . Highest education level: Not on file  Occupational History  . Occupation: Retired    Fish farm manager: NOT EMPLOYED  Social Needs  . Financial resource strain: Not on file  . Food insecurity:    Worry: Not on file    Inability: Not on file  . Transportation needs:    Medical: Not on file    Non-medical: Not on file  Tobacco Use  . Smoking status: Never Smoker  . Smokeless tobacco: Never Used  Substance and Sexual Activity  . Alcohol use: Yes    Frequency: Never    Comment: Occasional  . Drug use: No  . Sexual activity: Not on file  Lifestyle  . Physical activity:    Days per week: Not on file    Minutes per session: Not on file  . Stress: Not on file  Relationships  . Social connections:    Talks on phone: Not on file    Gets together: Not on file    Attends religious service: Not on file    Active member of club or organization: Not on file    Attends meetings of clubs or organizations: Not on file    Relationship status: Not on file  . Intimate partner violence:    Fear of current or ex partner: Not on file    Emotionally abused: Not on file    Physically abused: Not on file    Forced sexual activity: Not on file    Other Topics Concern  . Not on file  Social History Narrative   Health Care POA:    Emergency Contact: daughter, [name], [phone] husband, [name] [phone]   End of Life Plan:   Who lives with you: Lives with husband and son, [name]   Any pets: Rabbit, [name]   Diet: Patient has a varied diet but struggles with what to eat with hypertension, diabetes, parkinsons   Exercise: Patient does not have any regular exercise routine.   Seatbelts: Patient reports wearing her seatbelt when in vehicle.   Nancy Fetter Exposure/Protection: Patient reports wearing sun block lotion daily.   Hobbies: Watching game shows, writing poetry, writing in journal, volunteering at day program with son.    Caffeine Use: very little occasional          PHYSICAL EXAM  Vitals:   02/01/18 1549  BP: 110/82  Pulse: 84  Weight: 181 lb 3.2 oz (82.2 kg)  Height: 5\' 4"  (1.626 m)    Not recorded      Body mass index is 31.1 kg/m.  GENERAL EXAM: General: Patient is awake, alert and in no acute distress.  Well developed and groomed. Neck: Neck is supple. Cardiovascular: No carotid artery bruits.  Heart is regular rate and rhythm with no murmurs.  Neurologic Exam  Mental Status: Awake, alert. Language is fluent and  comprehension intact. Cranial Nerves: Pupils are equal and reactive to light.  Visual fields are full to confrontation.  Conjugate eye movements are full and symmetric.  Facial sensation and strength are symmetric.  Hearing is intact.  Palate elevated symmetrically and uvula is midline.  Shoulder shrug is symmetric.  Tongue is midline. NO OROLINGUAL DYSKINESIAS. Motor: MILD ORO-LINGUAL DYSKINESIAS; NO RESTING TREMOR IN BUE; NO POSTURAL TREMOR IN BUE; BRADYKINESIA AND RIGIDITY IN BUE AND BLE; Normal bulk. Full strength in the upper and lower extremities.  No pronator drift. Sensory: Intact and symmetric to light touch. Coordination: No ataxia or dysmetria on finger-nose or rapid alternating movement  testing. Reflexes: BUE and BLE present and symmetric Gait and Station: Narrow based gait. SMOOTH GAIT, USING ROLLATOR   DIAGNOSTIC DATA (LABS, IMAGING, TESTING) - I reviewed patient records, labs, notes, testing and imaging myself where available.  Lab Results  Component Value Date   WBC 9.8 01/13/2018   HGB 11.9 01/13/2018   HCT 36.2 01/13/2018   MCV 86 01/13/2018   PLT 333 01/13/2018      Component Value Date/Time   NA 141 01/13/2018 1137   K 4.6 01/13/2018 1137   CL 102 01/13/2018 1137   CO2 25 01/13/2018 1137   GLUCOSE 102 (H) 01/13/2018 1137   GLUCOSE 98 08/05/2017 0257   BUN 18 01/13/2018 1137   CREATININE 1.10 (H) 01/13/2018 1137   CREATININE 1.07 (H) 02/27/2016 1029   CALCIUM 9.9 01/13/2018 1137   PROT 7.0 01/13/2018 1137   ALBUMIN 4.5 01/13/2018 1137   AST 17 01/13/2018 1137   ALT 6 01/13/2018 1137   ALKPHOS 90 01/13/2018 1137   BILITOT 0.3 01/13/2018 1137   GFRNONAA 50 (L) 01/13/2018 1137   GFRNONAA 53 (L) 02/27/2016 1029   GFRAA 58 (L) 01/13/2018 1137   GFRAA 61 02/27/2016 1029   Lab Results  Component Value Date   CHOL 244 (H) 06/07/2014   HDL 38.60 (L) 06/07/2014   LDLCALC 123 (H) 12/11/2013   LDLDIRECT 179 (H) 02/27/2016   TRIG 384.0 (H) 06/07/2014   CHOLHDL 6 06/07/2014   Lab Results  Component Value Date   HGBA1C 5.6 07/28/2017   Lab Results  Component Value Date   VITAMINB12 301 01/05/2008   Lab Results  Component Value Date   TSH 2.260 11/26/2016    07/05/10 MRI BRAIN - minimal scattered chronic small vessel ischemic disease   ASSESSMENT AND PLAN  72 y.o. female with resting tremor of BUE, cogwheel rigidity, bradykinesia, decreased arm swing, anxiety, diff swallowing, memory loss.    Dx:  Parkinson disease ([place])  Abnormality of gait  Dyskinesia    PLAN:  PARKINSON'S DISEASE (established problem, stable) - continue carb/levo (25/100) --> 1 tab 5 times per day - recommend more supervision at home with medications and  ADLs - use rollator walker at all times  GI EMEMSIS (BLACK) AND DIARRHEA (BLACK) --> improving - follow up with Dr. Nori Riis and GI consult  MOOD DISORDER / LABILITY (stable) - follow up anger issues with Dr. Casimiro Needle - caution with excessive alcohol use (no more that 1 drink per day)  Meds ordered this encounter  Medications  . carbidopa-levodopa (SINEMET IR) 25-100 MG tablet    Sig: Take 1 tablet by mouth 5 (five) times daily.    Dispense:  150 tablet    Refill:  12   Return in about 1 year (around 02/02/2019).    Penni Bombard, MD 94/70/9628, 3:66 PM Certified in Neurology, Neurophysiology and Neuroimaging  [facility] [address] [place], [place] [address] [phone]

## 2018-02-09 ENCOUNTER — Telehealth: Payer: Self-pay | Admitting: *Deleted

## 2018-02-09 NOTE — Telephone Encounter (Signed)
Please have her do a blood sugar check once a day for a coup[le of week--PLEASE do them at different times of the day and write down the time. For example, do one in AM before breakfast, then next day do it at lunch, then next day mid afternoon---etc. It does absolutely no good to just check them all in the AM when you first get up   I would not change any therapy at this time. Please have her schedule with me in a couple of weeks and bring those readings.  If she is feeling bad--she needs to be seen as an ATC patient  THANKS! [name]

## 2018-02-09 NOTE — Telephone Encounter (Signed)
Pt calls and states that her CBG was over 200 today.  She is not having CP,SOB or lightheadedness.  She does say that she has a headache and is feeling anxious because her "sugar is never that high".  She declines appt today as she does not have a ride.  She is willing to have an appt later this week, but wants to me check with Dr. Nori Riis first.    Will forward to MD.  Avanna Sowder, Salome Spotted, [place]

## 2018-02-09 NOTE — Telephone Encounter (Signed)
Contacted pt and gave her the below message and scheduled her an appointment for 02/24/18 with pcp. Told pt that if she felt like she needed to be seen sooner we could put her on ATC.   Katharina Caper, April D, [place]

## 2018-02-24 ENCOUNTER — Ambulatory Visit: Payer: Self-pay | Admitting: Family Medicine

## 2018-04-28 ENCOUNTER — Ambulatory Visit: Payer: Medicare HMO | Admitting: Family Medicine

## 2018-05-19 ENCOUNTER — Ambulatory Visit: Payer: Medicare HMO | Admitting: Family Medicine

## 2018-06-08 ENCOUNTER — Other Ambulatory Visit: Payer: Self-pay | Admitting: Family Medicine

## 2018-06-08 ENCOUNTER — Ambulatory Visit (INDEPENDENT_AMBULATORY_CARE_PROVIDER_SITE_OTHER): Payer: Medicare HMO | Admitting: Family Medicine

## 2018-06-08 ENCOUNTER — Other Ambulatory Visit: Payer: Self-pay

## 2018-06-08 ENCOUNTER — Encounter: Payer: Self-pay | Admitting: Family Medicine

## 2018-06-08 VITALS — BP 115/80 | HR 80 | Ht 64.0 in | Wt 187.4 lb

## 2018-06-08 DIAGNOSIS — R0600 Dyspnea, unspecified: Secondary | ICD-10-CM

## 2018-06-08 DIAGNOSIS — K229 Disease of esophagus, unspecified: Secondary | ICD-10-CM | POA: Diagnosis not present

## 2018-06-08 DIAGNOSIS — G8929 Other chronic pain: Secondary | ICD-10-CM

## 2018-06-08 DIAGNOSIS — R0609 Other forms of dyspnea: Secondary | ICD-10-CM | POA: Diagnosis not present

## 2018-06-08 DIAGNOSIS — I10 Essential (primary) hypertension: Secondary | ICD-10-CM

## 2018-06-08 DIAGNOSIS — E119 Type 2 diabetes mellitus without complications: Secondary | ICD-10-CM

## 2018-06-08 DIAGNOSIS — M25562 Pain in left knee: Secondary | ICD-10-CM

## 2018-06-08 DIAGNOSIS — E538 Deficiency of other specified B group vitamins: Secondary | ICD-10-CM | POA: Diagnosis not present

## 2018-06-08 DIAGNOSIS — R0602 Shortness of breath: Secondary | ICD-10-CM | POA: Diagnosis not present

## 2018-06-08 DIAGNOSIS — R350 Frequency of micturition: Secondary | ICD-10-CM

## 2018-06-08 LAB — POCT URINALYSIS DIP (MANUAL ENTRY)
Bilirubin, UA: NEGATIVE
Blood, UA: NEGATIVE
Glucose, UA: NEGATIVE mg/dL
Ketones, POC UA: NEGATIVE mg/dL
Nitrite, UA: POSITIVE — AB
Protein Ur, POC: NEGATIVE mg/dL
Spec Grav, UA: 1.025 (ref 1.010–1.025)
Urobilinogen, UA: 0.2 E.U./dL
pH, UA: 5.5 (ref 5.0–8.0)

## 2018-06-08 LAB — POCT UA - MICROSCOPIC ONLY

## 2018-06-08 MED ORDER — SUCRALFATE 1 GM/10ML PO SUSP: 1.0000 g | Freq: Three times a day (TID) | ORAL | 5 refills | Status: DC

## 2018-06-08 MED ORDER — CYANOCOBALAMIN 1000 MCG/ML IJ SOLN
1000.0000 ug | Freq: Once | INTRAMUSCULAR | Status: AC
  Administered 2018-06-08: 1000 ug via INTRAMUSCULAR

## 2018-06-08 NOTE — Patient Instructions (Signed)
I have called in the carafate liquid for you to tak up to 4 times a day for your heartburn. If that does not improve it in a few weeks, let me know. I am sending you to a heart doctor to evaluate your shortness of breath with activity. They shold call you within a week--if they have not. Let me know I will send you a note about your labs Let me see you back in about 4-6 weeks

## 2018-06-09 LAB — COMPREHENSIVE METABOLIC PANEL
ALT: 10 IU/L (ref 0–32)
AST: 21 IU/L (ref 0–40)
Albumin/Globulin Ratio: 1.7 (ref 1.2–2.2)
Albumin: 4.3 g/dL (ref 3.7–4.7)
Alkaline Phosphatase: 88 IU/L (ref 39–117)
BUN/Creatinine Ratio: 13 (ref 12–28)
BUN: 18 mg/dL (ref 8–27)
Bilirubin Total: 0.4 mg/dL (ref 0.0–1.2)
CO2: 25 mmol/L (ref 20–29)
Calcium: 9.4 mg/dL (ref 8.7–10.3)
Chloride: 103 mmol/L (ref 96–106)
Creatinine, Ser: 1.38 mg/dL — ABNORMAL HIGH (ref 0.57–1.00)
GFR calc Af Amer: 44 mL/min/{1.73_m2} — ABNORMAL LOW (ref >59)
GFR calc non Af Amer: 38 mL/min/{1.73_m2} — ABNORMAL LOW (ref >59)
Globulin, Total: 2.5 g/dL (ref 1.5–4.5)
Glucose: 130 mg/dL — ABNORMAL HIGH (ref 65–99)
Potassium: 4.2 mmol/L (ref 3.5–5.2)
Sodium: 145 mmol/L — ABNORMAL HIGH (ref 134–144)
Total Protein: 6.8 g/dL (ref 6.0–8.5)

## 2018-06-10 ENCOUNTER — Encounter: Payer: Self-pay | Admitting: Family Medicine

## 2018-06-10 NOTE — Assessment & Plan Note (Signed)
Will check labs including creatinine today.  Continue on propranolol ER which gives her some control of her blood pressure and had also helped her tremor a little bit.

## 2018-06-10 NOTE — Progress Notes (Signed)
    CHIEF COMPLAINT / HPI: #1.  Wants a shot in her knee.  Is really been bothering her aching, 8-9 out of 10 particularly at night.  She does not want to pursue TKR. 2.  Having a lot of dyspnea on exertion that she thinks is a little unusual for her.  Admits she does not do much of anything, sits in front of the TV or lays in bed most of the day.  Recently however she has noticed that even walking 2 or 3 car lengths in her driveway causes her to have increased shortness of breath.  No chest pain with this. 3.  Having increased problems with reflux.  Has been taking omeprazole twice daily but in the last 6 weeks heartburn right through every day. 4.  Questions whether or not she has a urinary tract infection as her urine smells funny.  REVIEW OF SYSTEMS: HPI.  Additional partner review of systems is negative for lower extremity edema, no PND.  No abdominal pain, no unusual weight change, no fever.  No problems swallowing food.  No diarrhea, no constipation.  PERTINENT  PMH / PSH: I have reviewed the patient's medications, allergies, past medical and surgical history, smoking status and updated in the EMR as appropriate. Hx R TKR  OBJECTIVE:  Vital signs reviewed. GENERAL: Well-developed, well-nourished, no acute distress. CARDIOVASCULAR: Regular rate and rhythm no murmur gallop or rub LUNGS: Clear to auscultation bilaterally, no rales or wheeze. ABDOMEN: Soft positive bowel sounds NEURO: No gross focal neurological deficits. MSK: Movement of extremity x 4. Left  Knee ttp medial joint  Line. Lacks 10 degrees full extension. No effusion.    ASSESSMENT / PLAN:  No problem-specific Assessment & Plan notes found for this encounter.

## 2018-06-10 NOTE — Assessment & Plan Note (Signed)
Labs but do not need A1c today.  We will get that at next visit.  She is currently diet controlled.

## 2018-06-10 NOTE — Assessment & Plan Note (Signed)
I suspect her shortness of breath is largely related to deconditioning but it does seem that it is gotten a lot worse recently.  She probably needs a cardiac work-up so I will set her up with cardiology.

## 2018-06-10 NOTE — Assessment & Plan Note (Signed)
A little concerning that she has increased heartburn.  She reported that while she was in the hospital once they gave her some Carafate and that really helped.  I will keep her on the twice daily PPI and add Carafate 2-4 times a day and see her in 1 month.  She probably needs to go back to the GI doctor but does not really want to do that right now says she is too much going on

## 2018-06-10 NOTE — Assessment & Plan Note (Signed)
csi today

## 2018-06-15 ENCOUNTER — Telehealth: Payer: Self-pay

## 2018-06-15 ENCOUNTER — Encounter: Payer: Self-pay | Admitting: Family Medicine

## 2018-06-15 NOTE — Telephone Encounter (Signed)
Pts husband called nurse line requesting status of jury duty summons. [name] stated the form was given to PCP at 2/25 OV. [name] stated it was to be filled out and one copy sent to the court house and one to be mailed back to the patient. Please advise.

## 2018-06-17 ENCOUNTER — Encounter: Payer: Self-pay | Admitting: Family Medicine

## 2018-06-17 NOTE — Telephone Encounter (Signed)
Dear [name] Team I have sent this out on mail incl a copy to her plz let her know THANKS! [name]

## 2018-06-18 NOTE — Telephone Encounter (Signed)
Tried pt home number. No answer no voicemail. Tried [phone] the number husband [name] called from. That voicemail has not been set up.  If pt or husband calls, please give them the information below. Ottis Stain, CMA

## 2018-06-18 NOTE — Telephone Encounter (Signed)
PC. No voicemail. Phone rang 8 times with no answer. If pt calls, please give her the information below.  Ottis Stain, CMA

## 2018-06-21 ENCOUNTER — Other Ambulatory Visit: Payer: Self-pay | Admitting: Family Medicine

## 2018-06-22 MED ORDER — OMEPRAZOLE 40 MG PO CPDR: DELAYED_RELEASE_CAPSULE | ORAL | 3 refills | Status: DC

## 2018-07-02 ENCOUNTER — Ambulatory Visit: Payer: Medicare HMO | Admitting: Cardiology

## 2018-07-08 ENCOUNTER — Telehealth (INDEPENDENT_AMBULATORY_CARE_PROVIDER_SITE_OTHER): Payer: Medicare HMO | Admitting: *Deleted

## 2018-07-08 DIAGNOSIS — N390 Urinary tract infection, site not specified: Secondary | ICD-10-CM

## 2018-07-08 MED ORDER — CEPHALEXIN 500 MG PO CAPS: 500.0000 mg | ORAL_CAPSULE | Freq: Three times a day (TID) | ORAL | 0 refills | Status: DC

## 2018-07-08 NOTE — Assessment & Plan Note (Signed)
By symptoms sounds like a UTI.  Even though she has back pain she does not have fever or nausea and vomiting so feel ok to treat her with oral medications Would prefer culture but given risk of Covid coming to our office will treat without Asked them to call if not better in 2 days or if any worsening They agree

## 2018-07-08 NOTE — Telephone Encounter (Signed)
[facility] Visit  Patient consented to have visit conducted via telephone.  Encounter participants: Patient: [name]  Provider: Lind Covert  Others (if applicable): Husband who helped give history   Chief Complaint: kidney infection  HPI:  For last 2 days having increased frequency and burning and back pain.  Feels like her prior UTIs last about a year ago.  No recent antibiotics.  No nausea and vomiting or fever or rash or resp symptoms   Pertinent PMHx: Parkinsons  Her speech is difficult to understand and she asks that I talk to her husband - this is from her Parkinsons She through him answers questions well - knows her allergies etc   Assessment/Plan:  UTI (urinary tract infection) By symptoms sounds like a UTI.  Even though she has back pain she does not have fever or nausea and vomiting so feel ok to treat her with oral medications Would prefer culture but given risk of Covid coming to our office will treat without Asked them to call if not better in 2 days or if any worsening They agree     Time spent on phone with patient: 10 minutes

## 2018-07-08 NOTE — Telephone Encounter (Signed)
LM on nurse line about "needing medication for her bladder".  Called back, lmovm for callback.  Christen Bame, CMA

## 2018-07-14 ENCOUNTER — Ambulatory Visit: Payer: Self-pay | Admitting: Family Medicine

## 2018-07-14 ENCOUNTER — Other Ambulatory Visit: Payer: Self-pay

## 2018-07-14 ENCOUNTER — Telehealth (INDEPENDENT_AMBULATORY_CARE_PROVIDER_SITE_OTHER): Payer: Medicare HMO | Admitting: Family Medicine

## 2018-07-14 DIAGNOSIS — M545 Low back pain, unspecified: Secondary | ICD-10-CM

## 2018-07-14 DIAGNOSIS — G8929 Other chronic pain: Secondary | ICD-10-CM

## 2018-07-14 NOTE — Progress Notes (Signed)
[place] Telemedicine Visit  Patient consented to have visit conducted via telephone.  Encounter participants: Patient: [name]  Provider: Andrena Mews  Others (if applicable): Husband: [name]  Chief Complaint: Back pain  HPI: Back pain was started on Keflex, thinking it was UTI.  She has one more day to complete her A/B. Pain is located in her lower back; pain is sharp in nature, on and off, about 6/10 in severity. Pain has been ongoing for three weeks. She uses Tramadol prn pain, which helps some. She denies any recent, no trauma or injury to her back. She endorsed many years of back pain, but this is different and getting worse. She denies bowel or bladder incontinence. She denies LL weakness or number.  ROS:As in the body of hx.  Pertinent PMHx: Problem list reviewed.  Exam:  Respiratory: No respiratory issue.  Assessment/Plan: Lumbar spine pain. Acute on chronic. Likely OA. No neurologic symptoms. COntinue Tramadol and add Tylenol as needed. Xray recommended and was ordered. CMA message to coordinate Xray. Return precaution discussed.  Time spent on phone with patient: 11 minutes

## 2018-07-22 DIAGNOSIS — F3341 Major depressive disorder, recurrent, in partial remission: Secondary | ICD-10-CM | POA: Diagnosis not present

## 2018-09-03 ENCOUNTER — Telehealth (INDEPENDENT_AMBULATORY_CARE_PROVIDER_SITE_OTHER): Payer: Medicare HMO | Admitting: Family Medicine

## 2018-09-03 ENCOUNTER — Other Ambulatory Visit: Payer: Self-pay

## 2018-09-03 DIAGNOSIS — R0600 Dyspnea, unspecified: Secondary | ICD-10-CM

## 2018-09-03 DIAGNOSIS — G20 Parkinson's disease: Secondary | ICD-10-CM

## 2018-09-03 DIAGNOSIS — I10 Essential (primary) hypertension: Secondary | ICD-10-CM

## 2018-09-03 DIAGNOSIS — G20A1 Parkinson's disease without dyskinesia, without mention of fluctuations: Secondary | ICD-10-CM

## 2018-09-03 DIAGNOSIS — R0609 Other forms of dyspnea: Secondary | ICD-10-CM

## 2018-09-03 MED ORDER — SUCRALFATE 1 GM/10ML PO SUSP: ORAL | 3 refills | Status: DC

## 2018-09-03 NOTE — Assessment & Plan Note (Signed)
She has discontinued her propranolol which was used for her blood pressure as well as some potential help with her tremor from her Parkinson's.  She has not checked her blood pressure since then.  She declares she feels extremely well without the propranolol and use saying that it was causing diarrhea etc.  I discussed the fact that tramadol is probably not causing her to be more irritable with her husband and probably was not causing her diarrhea.  She probably needs something for blood pressure control.  I would like her to come in for in person visit sometime in the next 3 to 4 weeks.  She also has appointment with the cardiologist next week so perhaps they will address her blood pressure.

## 2018-09-03 NOTE — Assessment & Plan Note (Signed)
She has not seen much increase in her tremor since she stopped the propranolol.  I reminded her that we could use that for her hypertension as well.  He continues to follow-up with a neurologist.

## 2018-09-03 NOTE — Progress Notes (Signed)
[place] Telemedicine Visit  Patient consented to have visit conducted via telephone.  Encounter participants: Patient: [name]  Provider: [name]  Others (if applicable): none  Chief Complaint / HPI: Says she stopped her propranolol because she was tired of taking it and she feels much better since stopping.  States she is having Less diarrhea.  She is less aggravated with her husband.  She has her heart doctor appointment next week.  It was delayed because of the coronavirus. #2.  Also talked about her sleep.  Set her psychiatrist is no longer giving her sleeping medicine.  She again asked me for that.   ROS: No fever No productive cough.  No chest pain.  No unusual weight change No abdominal pain She does continue to have shortness of breath with even small activities such as walking 5-10 steps.  No chest pain.  OBJECTIVE observed via telemedicine device: PSYCH: AxOx4.  No psychomotor retardation or agitation.  Speech is less fluent secondary to Parkinson's disease.  Asks and answers questions appropriately. Mood is congruent. RESPIRATORY: No evidence of labored breathing via phone exam    Pertinent PMHx / PSHx:  I have reviewed the patient's medications, allergies, past medical and surgical history, smoking status and updated in the EMR as appropriate.   Assessment/Plan:  Parkinson's disease She has not seen much increase in her tremor since she stopped the propranolol.  I reminded her that we could use that for her hypertension as well.  He continues to follow-up with a neurologist.  Essential hypertension She has discontinued her propranolol which was used for her blood pressure as well as some potential help with her tremor from her Parkinson's.  She has not checked her blood pressure since then.  She declares she feels extremely well without the propranolol and use saying that it was causing diarrhea etc.  I discussed the fact that  tramadol is probably not causing her to be more irritable with her husband and probably was not causing her diarrhea.  She probably needs something for blood pressure control.  I would like her to come in for in person visit sometime in the next 3 to 4 weeks.  She also has appointment with the cardiologist next week so perhaps they will address her blood pressure.  Dyspnea on exertion I really suspect this is mostly related to deconditioning.  She not having any shortness of breath at rest.  She is not having any chest pains.  We had already scheduled her to see cardiology several months ago and she will keep that appointment.    Time spent on phone with patient: 23 minutes

## 2018-09-03 NOTE — Assessment & Plan Note (Signed)
I really suspect this is mostly related to deconditioning.  She not having any shortness of breath at rest.  She is not having any chest pains.  We had already scheduled her to see cardiology several months ago and she will keep that appointment.

## 2018-09-07 ENCOUNTER — Telehealth (INDEPENDENT_AMBULATORY_CARE_PROVIDER_SITE_OTHER): Payer: Medicare HMO | Admitting: Cardiology

## 2018-09-07 ENCOUNTER — Telehealth: Payer: Self-pay

## 2018-09-07 ENCOUNTER — Encounter: Payer: Self-pay | Admitting: Cardiology

## 2018-09-07 ENCOUNTER — Other Ambulatory Visit: Payer: Self-pay

## 2018-09-07 DIAGNOSIS — I10 Essential (primary) hypertension: Secondary | ICD-10-CM

## 2018-09-07 DIAGNOSIS — R0609 Other forms of dyspnea: Secondary | ICD-10-CM | POA: Diagnosis not present

## 2018-09-07 DIAGNOSIS — R0600 Dyspnea, unspecified: Secondary | ICD-10-CM

## 2018-09-07 DIAGNOSIS — E7800 Pure hypercholesterolemia, unspecified: Secondary | ICD-10-CM | POA: Diagnosis not present

## 2018-09-07 MED ORDER — FUROSEMIDE 20 MG PO TABS: 20.0000 mg | ORAL_TABLET | ORAL | 3 refills | Status: DC | PRN

## 2018-09-07 MED ORDER — METOPROLOL SUCCINATE ER 25 MG PO TB24: 75.0000 mg | ORAL_TABLET | Freq: Every day | ORAL | 3 refills | Status: DC

## 2018-09-07 NOTE — Patient Instructions (Addendum)
Medication Instructions:  Stop: Propanolol  Start: Toprol 75 mg, 3 tablets, by mouth, daily Start: Lasix 20 mg, daily, as need for swelling, by mouth  If you need a refill on your cardiac medications before your next appointment, please call your pharmacy.   Lab work: Theatre manager care doctor and have them fax your last fasting labs to our office, at [phone].   ProBNP and BMET, same day as stress test.   If you have labs (blood work) drawn today and your tests are completely normal, you will receive your results only by: Marland Kitchen MyChart Message (if you have MyChart) OR . A paper copy in the mail If you have any lab test that is abnormal or we need to change your treatment, we will call you to review the results.  Testing/Procedures: Your physician has requested that you have an echocardiogram. Echocardiography is a painless test that uses sound waves to create images of your heart. It provides your doctor with information about the size and shape of your heart and how well your heart's chambers and valves are working. This procedure takes approximately one hour. There are no restrictions for this procedure.  Your physician has requested that you have a lexiscan myoview. For further information please visit [email or web address]. Please follow instruction sheet, as given.  Follow-Up: At [facility], you and your health needs are our priority.  As part of our continuing mission to provide you with exceptional heart care, we have created designated Provider Care Teams.  These Care Teams include your primary Cardiologist (physician) and Advanced Practice Providers (APPs -  Physician Assistants and Nurse Practitioners) who all work together to provide you with the care you need, when you need it. You will need a follow up appointment in 6 months.  Please call our office 2 months in advance to schedule this appointment.  You may see Dr. Radford Pax or one of the following Advanced Practice  Providers on your designated Care Team:   Purvis, PA-C Melina Copa, PA-C . Ermalinda Barrios, PA-C

## 2018-09-07 NOTE — Progress Notes (Signed)
Virtual Visit via Video Note   This visit type was conducted due to national recommendations for restrictions regarding the COVID-19 Pandemic (e.g. social distancing) in an effort to limit this patient's exposure and mitigate transmission in our community.  Due to her co-morbid illnesses, this patient is at least at moderate risk for complications without adequate follow up.  This format is felt to be most appropriate for this patient at this time.  All issues noted in this document were discussed and addressed.  A limited physical exam was performed with this format.  Please refer to the patient's chart for her consent to telehealth for [facility].  Evaluation Performed:  Follow-up visit  This visit type was conducted due to national recommendations for restrictions regarding the COVID-19 Pandemic (e.g. social distancing).  This format is felt to be most appropriate for this patient at this time.  All issues noted in this document were discussed and addressed.  No physical exam was performed (except for noted visual exam findings with Video Visits).  Please refer to the patient's chart (MyChart message for video visits and phone note for telephone visits) for the patient's consent to telehealth for [facility].  Date:  09/07/2018   ID:  [name], DOB 1946/01/28, MRN [id number]  Patient Location:  Home  Provider location:   [place]  PCP:  [name], MD  Cardiologist:   NEW Electrophysiologist:  None   Chief Complaint:  DOE  History of Present Illness:    [name] is a 73 y.o. female who presents via audio/video conferencing for a telehealth visit today in referral by Mallory Mcmurray, MD for evaluation of DOE.   This is a 73yo female with a history of hypertension, diabetes, hyperlipidemia and Parkinson's disease.   She was seen remotely by Dr. Meda Young in the past when she went to the emergency room in August 2015 with chest pain and ruled out for MI.  Apparently  she had been having chest pain for over 15 years but it had gotten worse.  Stress test showed no ischemia or scar.  2D echo was normal except for mild grade 2 diastolic dysfunction and she was started on Lasix but stopped it because it made her feel tired.  She was last seen by Dr. Meda Young in 2016.  She never followed up after that.  She is now here for evaluation of dyspnea on exertion.  She has had DOE in the past and had a normal nuclear stress test in 2016.  Since then she has noticed increased SOB especially in the last 6 months.  She says that she has been much more sedentary recently due to worsening of her Parkinson's dz.  She gets SOB with minimal exertion and occasionally will have some pressure in her chest.  This occurs sporadically and she thinks it is related to anxiety.  It is nonexertional.  She says that she has had CP for 30 years and that has not changed but the SOB has.  She denies any PND, orthopnea, dizziness, palpitations or syncope.  She has LE edema intermittently.    The patient does not have symptoms concerning for COVID-19 infection (fever, chills, cough, or new shortness of breath).    Prior CV studies:   The following studies were reviewed today:  OV notes from 2016  Past Medical History:  Diagnosis Date  . Allergy   . Anxiety   . Arthritis    "knees, back" (06/29/2014)  . ARTHRITIS, BACK  03/28/2009   Qualifier: Diagnosis of  By: Mallory Riis MD, [name]    . CARPAL TUNNEL SYNDROME, LEFT 01/05/2008   Qualifier: Diagnosis of  By: Mallory Riis MD, [name]    . Cataract   . Chronic bronchitis ([place])    "get it q yr"  . Chronic mid back pain   . Cognitive impairment 12/11/2013  . Complication of anesthesia 07/2008   "hard to get me woke up when I had my knee replaced; they said they had to bring me back"  . Dementia ([place])    "I have some; not dx'd by a dr" (08/06/2017)  . Diabetes mellitus type 2, diet-controlled ([place]) 08/05/2006       . Disorder of bursae and tendons in shoulder region  01/24/2009   Qualifier: Diagnosis of  By: Mallory Riis MD, [name]    . Esophageal abnormality 08/12/2010   Barium swallow 12/2011  IMPRESSION: Esophageal dysmotility.  No fixed esophageal narrowing or stricture. However, the barium tablet was transiently delayed at the GE junction.  Postsurgical changes at the GE junction. Gastroesophageal reflux is suspected, but could not be confirmed due to the patient's inability to clear her esophagus in the prone position.    . Esophageal stricture   . GERD (gastroesophageal reflux disease)   . History of gout 1970's  . Hyperlipidemia   . Hypertension   . Major depressive disorder, recurrent episode ([place]) 06/11/2006   Qualifier: Diagnosis of  By: [name]    . Movement disorder   . Neuromuscular disorder ([place])   . Osteoarthritis   . Osteoarthritis of multiple joints 08/04/2006   S/p R TKR Significant OA continues to be a problem in her left knee. Lower back OA Complicated by her parkinsonism    . Parkinson's disease ([place])   . 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 Scoliosis   . SCOLIOSIS 03/28/2009   Qualifier: Diagnosis of  By: Mallory Riis MD, [name]    . Situational depression   . Substance abuse ([place])   . Type II diabetes mellitus ([place])   . UTI (lower urinary tract infection)    Past Surgical History:  Procedure Laterality Date  . ABDOMINAL HYSTERECTOMY    . CHOLECYSTECTOMY OPEN    . COLON SURGERY    . DILATION AND CURETTAGE OF UTERUS    . ESOPHAGOGASTRIC FUNDOPLASTY     some type "esoph surgery" per pt  . ESOPHAGOGASTRODUODENOSCOPY (EGD) WITH PROPOFOL N/A 08/05/2017   Procedure: ESOPHAGOGASTRODUODENOSCOPY (EGD) WITH PROPOFOL;  Surgeon: Jerene Bears, MD;  Location: [place];  Service: Gastroenterology;  Laterality: N/A;  . JOINT REPLACEMENT    . TOTAL KNEE ARTHROPLASTY Right 07/2008  . TUBAL LIGATION       Current Meds  Medication Sig  . gabapentin (NEURONTIN) 300 MG capsule Take 300-600 mg by mouth See admin instructions. Take   2 capsules in the morning and 2 capsules at lunch 3 capsule in the evening as needed  . omeprazole (PRILOSEC) 40 MG capsule TAKE 1 CAPSULE BY MOUTH TWICE DAILY  . propranolol (INDERAL) 60 MG tablet Take 60 mg by mouth every other day.  . sertraline (ZOLOFT) 100 MG tablet Take 100 mg by mouth daily. Per psych  . sucralfate (CARAFATE) 1 GM/10ML suspension TAKE 10 ML BY MOUTH FOUR TIMES DAILY WITH MEALS AND AT BEDTIME  . traMADol (ULTRAM) 50 MG tablet TAKE 1 TO 2 TABLETS BY MOUTH TWICE DAILY AS NEEDED FOR CHRONIC KNEE PAIN     Allergies:   Ace inhibitors; Azilect [  rasagiline mesylate]; and Lisinopril   Social History   Tobacco Use  . Smoking status: Never Smoker  . Smokeless tobacco: Never Used  Substance Use Topics  . Alcohol use: Yes    Frequency: Never    Comment: Occasional  . Drug use: No     Family Hx: The patient's family history includes Cerebral palsy in her son; Diabetes in her mother; Heart attack in her brother and sister; Heart disease in her brother, father, mother, and sister. There is no history of Colon cancer, Esophageal cancer, Liver cancer, Pancreatic cancer, Stomach cancer, or Rectal cancer.  ROS:   Please see the history of present illness.     All other systems reviewed and are negative.   Labs/Other Tests and Data Reviewed:    Recent Labs: 01/13/2018: Hemoglobin 11.9; Platelets 333 06/08/2018: ALT 10; BUN 18; Creatinine, Ser 1.38; Potassium 4.2; Sodium 145   Recent Lipid Panel Lab Results  Component Value Date/Time   CHOL 244 (H) 06/07/2014 09:39 AM   TRIG 384.0 (H) 06/07/2014 09:39 AM   HDL 38.60 (L) 06/07/2014 09:39 AM   CHOLHDL 6 06/07/2014 09:39 AM   LDLCALC 123 (H) 12/11/2013 04:40 AM   LDLDIRECT 179 (H) 02/27/2016 10:29 AM    Wt Readings from Last 3 Encounters:  09/07/18 186 lb (84.4 kg)  06/08/18 187 lb 6.4 oz (85 kg)  02/01/18 181 lb 3.2 oz (82.2 kg)     Objective:    Vital Signs:  BP (!) 168/80   Pulse 63   Ht 5\' 4"  (1.626 m)   Wt  186 lb (84.4 kg)   BMI 31.93 kg/m     ASSESSMENT & PLAN:    1.  DOE - she has had problems with dyspnea on exertion for some time and this was listed as a problem when Dr. Meda Young saw her.  Her echo in the past showed normal LV function with grade 2 diastolic dysfunction.  She had been placed on Lasix in the past but she did not like it because it made her feel tired and she stopped it on her own.  Nuclear stress test in 2016 showed no ischemia.  Her weight is stable and actually the same as 4 years ago when she was seen in 2016.  SOB could be due to diastolic CHF but also could be due to progression of Parkinsons with increased deconditioning.  Also need to consider ischemia.  I have recommended nuclear stress test to rule out ischemia and repeat echo to reassess LVF. I will check a BMET and BNP when she comes in for stress test.   2.  Hypertension -BP is borderline controlled today on exam.  I am going to change her propranolol to Toprol XL.  I will check with pharmacy about the dose conversion.   3.  Hyperlipidemia -I do not have any recent lipids on her.  I will try to get a copy from her PCP.  4.  COVID-19 Education:The signs and symptoms of COVID-19 were discussed with the patient and how to seek care for testing (follow up with PCP or arrange E-visit).  The importance of social distancing was discussed today.  Patient Risk:   After full review of this patient's clinical status, I feel that they are at least moderate risk at this time.  Time:   Today, I have spent 20 minutes directly with the patient on telephone discussing medical problems including DOE, HTN, lipids.  We also reviewed the symptoms of COVID 19 and  the ways to protect against contracting the virus with telehealth technology.  I spent an additional 5 minutes reviewing patient's chart including prior notes and 2D echo from 2016.  Medication Adjustments/Labs and Tests Ordered: Current medicines are reviewed at length with the  patient today.  Concerns regarding medicines are outlined above.  Tests Ordered: No orders of the defined types were placed in this encounter.  Medication Changes: No orders of the defined types were placed in this encounter.   Disposition:  Follow up prn  Signed, Fransico Him, MD  09/07/2018 1:40 PM    [facility]

## 2018-09-07 NOTE — Telephone Encounter (Signed)

## 2018-09-14 ENCOUNTER — Telehealth (HOSPITAL_COMMUNITY): Payer: Self-pay | Admitting: *Deleted

## 2018-09-14 NOTE — Telephone Encounter (Signed)
Patient given detailed instructions per Myocardial Perfusion Study Information Sheet for the test on 09/17/18 at 10:45. Patient notified to arrive 15 minutes early and that it is imperative to arrive on time for appointment to keep from having the test rescheduled.  If you need to cancel or reschedule your appointment, please call the office within 24 hours of your appointment. . Patient verbalized understanding.[name]

## 2018-09-16 ENCOUNTER — Telehealth (HOSPITAL_COMMUNITY): Payer: Self-pay | Admitting: Cardiology

## 2018-09-16 NOTE — Telephone Encounter (Signed)

## 2018-09-17 ENCOUNTER — Ambulatory Visit (HOSPITAL_BASED_OUTPATIENT_CLINIC_OR_DEPARTMENT_OTHER): Payer: Medicare HMO

## 2018-09-17 ENCOUNTER — Ambulatory Visit (HOSPITAL_COMMUNITY): Payer: Medicare HMO | Attending: Cardiovascular Disease

## 2018-09-17 ENCOUNTER — Other Ambulatory Visit: Payer: Self-pay

## 2018-09-17 VITALS — Ht 64.0 in | Wt 186.0 lb

## 2018-09-17 DIAGNOSIS — E119 Type 2 diabetes mellitus without complications: Secondary | ICD-10-CM | POA: Diagnosis not present

## 2018-09-17 DIAGNOSIS — R0609 Other forms of dyspnea: Secondary | ICD-10-CM | POA: Diagnosis not present

## 2018-09-17 DIAGNOSIS — I10 Essential (primary) hypertension: Secondary | ICD-10-CM | POA: Insufficient documentation

## 2018-09-17 DIAGNOSIS — R0600 Dyspnea, unspecified: Secondary | ICD-10-CM

## 2018-09-17 LAB — MYOCARDIAL PERFUSION IMAGING
LV dias vol: 37 mL (ref 46–106)
LV sys vol: 7 mL
Peak HR: 75 {beats}/min
Rest HR: 61 {beats}/min
SDS: 2
SRS: 0
SSS: 2
TID: 0.8

## 2018-09-17 LAB — ECHOCARDIOGRAM COMPLETE
Height: 64 in
Weight: 2976 oz

## 2018-09-17 MED ORDER — TECHNETIUM TC 99M TETROFOSMIN IV KIT
10.1000 | PACK | Freq: Once | INTRAVENOUS | Status: AC | PRN
  Administered 2018-09-17: 10.1 via INTRAVENOUS
  Filled 2018-09-17: qty 11

## 2018-09-17 MED ORDER — REGADENOSON 0.4 MG/5ML IV SOLN
0.4000 mg | Freq: Once | INTRAVENOUS | Status: AC
  Administered 2018-09-17: 0.4 mg via INTRAVENOUS

## 2018-09-17 MED ORDER — TECHNETIUM TC 99M TETROFOSMIN IV KIT
32.6000 | PACK | Freq: Once | INTRAVENOUS | Status: AC | PRN
  Administered 2018-09-17: 32.6 via INTRAVENOUS
  Filled 2018-09-17: qty 33

## 2018-09-20 ENCOUNTER — Telehealth: Payer: Self-pay

## 2018-09-20 NOTE — Telephone Encounter (Signed)
I have done a Maximum Dispensing Limit on the pts Metoprolol 25mg  (take 3 tablets daily) through covermymeds. Key: Arline Asp

## 2018-09-21 NOTE — Telephone Encounter (Signed)
**Note De-Identified  Obfuscation** Message received from covermymeds: [name] Key: [id number] - PA Case ID: [id number]  Outcome  Approvedtoday  PA Case: [id number] Status: Approved Coverage Starts on: 04/14/2018 12:00:00, Coverage Ends on: 04/14/2019   I have notified the pts pharmacy of this approval.

## 2018-09-21 NOTE — Telephone Encounter (Signed)
We have also received an approval letter from [facility] stating that the pts Metoprolol Maximum dispensing limit has been approved until 04/14/2019.

## 2018-09-22 ENCOUNTER — Telehealth: Payer: Self-pay

## 2018-09-22 DIAGNOSIS — I421 Obstructive hypertrophic cardiomyopathy: Secondary | ICD-10-CM

## 2018-09-22 DIAGNOSIS — I272 Pulmonary hypertension, unspecified: Secondary | ICD-10-CM

## 2018-09-22 NOTE — Telephone Encounter (Signed)
Notes recorded by Sueanne Margarita, MD on 09/17/2018 at 2:30 PM EDT 2D echo showed normal LVF with thickening of the heart muscle and mild HOCM. There is midly increased BP in the pulmonary arteries. Please make sure that her labs were done today. Her stress test is pending. Please find out what her BP has been running. Will await BNP prior to further recs. I would like her to have a Cardiac MRI to assess for HOCM

## 2018-09-22 NOTE — Telephone Encounter (Signed)
-----   Message from Sueanne Margarita, MD sent at 09/17/2018  2:32 PM EDT ----- She will needs a repeat 2D echo in 1 year to reassess PA pressures. Need to workup etiology of elevated PASP.  Please get a VQ scan to rule out PE, PFTs with DLCO and sleep study

## 2018-09-22 NOTE — Telephone Encounter (Signed)
Spoke with the patient, she expressed understanding about scheduling all of her procedures.

## 2018-09-27 ENCOUNTER — Other Ambulatory Visit: Payer: Self-pay

## 2018-09-27 ENCOUNTER — Other Ambulatory Visit: Payer: Medicare HMO | Admitting: *Deleted

## 2018-09-27 ENCOUNTER — Telehealth: Payer: Self-pay | Admitting: Cardiology

## 2018-09-27 DIAGNOSIS — R0609 Other forms of dyspnea: Secondary | ICD-10-CM | POA: Diagnosis not present

## 2018-09-27 DIAGNOSIS — R0600 Dyspnea, unspecified: Secondary | ICD-10-CM

## 2018-09-28 LAB — BASIC METABOLIC PANEL
BUN/Creatinine Ratio: 11 — ABNORMAL LOW (ref 12–28)
BUN: 14 mg/dL (ref 8–27)
CO2: 22 mmol/L (ref 20–29)
Calcium: 10.1 mg/dL (ref 8.7–10.3)
Chloride: 103 mmol/L (ref 96–106)
Creatinine, Ser: 1.31 mg/dL — ABNORMAL HIGH (ref 0.57–1.00)
GFR calc Af Amer: 47 mL/min/{1.73_m2} — ABNORMAL LOW (ref >59)
GFR calc non Af Amer: 40 mL/min/{1.73_m2} — ABNORMAL LOW (ref >59)
Glucose: 157 mg/dL — ABNORMAL HIGH (ref 65–99)
Potassium: 3.8 mmol/L (ref 3.5–5.2)
Sodium: 144 mmol/L (ref 134–144)

## 2018-09-28 LAB — PRO B NATRIURETIC PEPTIDE: NT-Pro BNP: 148 pg/mL (ref 0–301)

## 2018-09-29 ENCOUNTER — Telehealth: Payer: Self-pay | Admitting: *Deleted

## 2018-09-29 ENCOUNTER — Encounter: Payer: Self-pay | Admitting: *Deleted

## 2018-09-29 ENCOUNTER — Ambulatory Visit (HOSPITAL_COMMUNITY)
Admission: RE | Admit: 2018-09-29 | Discharge: 2018-09-29 | Disposition: A | Discharge status: Home / Self Care | Payer: Medicare HMO | Source: Ambulatory Visit | Attending: Cardiology | Admitting: Cardiology

## 2018-09-29 ENCOUNTER — Encounter (HOSPITAL_COMMUNITY)
Admission: RE | Admit: 2018-09-29 | Discharge: 2018-09-29 | Disposition: A | Discharge status: Home / Self Care | Payer: Medicare HMO | Source: Ambulatory Visit | Attending: Cardiology | Admitting: Cardiology

## 2018-09-29 ENCOUNTER — Other Ambulatory Visit: Payer: Self-pay | Admitting: Cardiology

## 2018-09-29 ENCOUNTER — Other Ambulatory Visit: Payer: Self-pay

## 2018-09-29 DIAGNOSIS — I272 Pulmonary hypertension, unspecified: Secondary | ICD-10-CM | POA: Insufficient documentation

## 2018-09-29 MED ORDER — TECHNETIUM TO 99M ALBUMIN AGGREGATED
1.8000 | Freq: Once | INTRAVENOUS | Status: AC | PRN
  Administered 2018-09-29: 1.8 via INTRAVENOUS

## 2018-09-29 NOTE — Telephone Encounter (Signed)
Spoke with patient regarding appointment for Cardiac MRI scheduled for 10/25/18 at 11:00 am at [facility] mail information to patient---patient voiced her understanding

## 2018-10-20 ENCOUNTER — Ambulatory Visit: Payer: Medicare HMO | Admitting: Family Medicine

## 2018-10-25 ENCOUNTER — Other Ambulatory Visit: Payer: Self-pay

## 2018-10-25 ENCOUNTER — Ambulatory Visit (HOSPITAL_COMMUNITY)
Admission: RE | Admit: 2018-10-25 | Discharge: 2018-10-25 | Disposition: A | Discharge status: Home / Self Care | Payer: Medicare HMO | Source: Ambulatory Visit | Attending: Cardiology | Admitting: Cardiology

## 2018-10-25 DIAGNOSIS — I421 Obstructive hypertrophic cardiomyopathy: Secondary | ICD-10-CM

## 2018-10-25 MED ORDER — GADOBUTROL 1 MMOL/ML IV SOLN
10.0000 mL | Freq: Once | INTRAVENOUS | Status: AC | PRN
  Administered 2018-10-25: 12:00:00 10 mL via INTRAVENOUS

## 2018-10-29 ENCOUNTER — Telehealth: Payer: Self-pay | Admitting: Cardiology

## 2018-10-29 NOTE — Telephone Encounter (Signed)
Advised the patient, that the results were not back yet and we would call her once they were available.

## 2018-10-29 NOTE — Telephone Encounter (Signed)
Per pt call she would like her results please from Monday's test  MRI

## 2018-11-01 ENCOUNTER — Telehealth: Payer: Self-pay | Admitting: Nurse Practitioner

## 2018-11-01 DIAGNOSIS — I421 Obstructive hypertrophic cardiomyopathy: Secondary | ICD-10-CM

## 2018-11-01 NOTE — Telephone Encounter (Signed)
-----   Message from Sarina Ill, RN sent at 10/29/2018  5:12 PM EDT -----  ----- Message ----- From: Sueanne Margarita, MD Sent: 10/29/2018   4:35 PM EDT To: [name], MD, Sarina Ill, RN  Please let patient know that cardiac MRI showed hyperdynamic LVF with EF 65-70%.  Nondiagnostic for HOCM.  No SAM noted or MR.  If HOCM present it is a low risk phenotype.

## 2018-11-01 NOTE — Telephone Encounter (Signed)
Results reviewed with patient. She denies hx of syncope and states mother died from CHF, father and siblings have had MI in their 43's. Patient agrees to plan to wear monitor. I advised someone from our office will call her with instructions. She thanked me for the call.

## 2018-11-10 ENCOUNTER — Telehealth: Payer: Self-pay | Admitting: Radiology

## 2018-11-10 NOTE — Telephone Encounter (Signed)
Enrolled patient for a 14 day Zio monitor to be mailed. Brief instructions were gone over with patient and she knows to expect the monitor to arrive in 3-4 days

## 2018-11-17 ENCOUNTER — Ambulatory Visit: Payer: Medicare HMO

## 2018-11-23 ENCOUNTER — Other Ambulatory Visit (INDEPENDENT_AMBULATORY_CARE_PROVIDER_SITE_OTHER): Payer: Medicare HMO

## 2018-11-23 DIAGNOSIS — I421 Obstructive hypertrophic cardiomyopathy: Secondary | ICD-10-CM | POA: Diagnosis not present

## 2018-12-01 ENCOUNTER — Encounter: Payer: Self-pay | Admitting: Family Medicine

## 2018-12-01 ENCOUNTER — Ambulatory Visit (INDEPENDENT_AMBULATORY_CARE_PROVIDER_SITE_OTHER): Payer: Medicare HMO | Admitting: Family Medicine

## 2018-12-01 ENCOUNTER — Other Ambulatory Visit: Payer: Self-pay

## 2018-12-01 VITALS — Wt 180.6 lb

## 2018-12-01 DIAGNOSIS — E119 Type 2 diabetes mellitus without complications: Secondary | ICD-10-CM

## 2018-12-01 DIAGNOSIS — E538 Deficiency of other specified B group vitamins: Secondary | ICD-10-CM

## 2018-12-01 DIAGNOSIS — I10 Essential (primary) hypertension: Secondary | ICD-10-CM

## 2018-12-01 DIAGNOSIS — G8929 Other chronic pain: Secondary | ICD-10-CM | POA: Diagnosis not present

## 2018-12-01 DIAGNOSIS — M1712 Unilateral primary osteoarthritis, left knee: Secondary | ICD-10-CM

## 2018-12-01 DIAGNOSIS — G20 Parkinson's disease: Secondary | ICD-10-CM

## 2018-12-01 DIAGNOSIS — M25562 Pain in left knee: Secondary | ICD-10-CM | POA: Diagnosis not present

## 2018-12-01 LAB — POCT GLYCOSYLATED HEMOGLOBIN (HGB A1C): HbA1c, POC (controlled diabetic range): 6 % (ref 0.0–7.0)

## 2018-12-01 MED ORDER — CYANOCOBALAMIN 1000 MCG/ML IJ SOLN
1000.0000 ug | Freq: Once | INTRAMUSCULAR | Status: AC
  Administered 2018-12-01: 1000 ug via INTRAMUSCULAR

## 2018-12-01 NOTE — Progress Notes (Signed)
b12

## 2018-12-02 NOTE — Progress Notes (Signed)
    CHIEF COMPLAINT / HPI: #1.  Left knee pain: Wants to have a corticosteroid shot 2.  Transfer her B12 shot 3.  She stopped taking her metoprolol because she did not think she needed it.  Has had no chest pain.  At one point we stopped her propranolol which we were using for tremor.  She wonders if she needs to restart that as well because she is having a lot of mouth tremor related to her Parkinson's.  REVIEW OF SYSTEMS: No chest pain, no shortness of breath.  No unusual weight change.  No cough.  No lower extremity edema.  Continues to have parkinsonian symptoms  PERTINENT  PMH / PSH: I have reviewed the patient's medications, allergies, past medical and surgical history, smoking status and updated in the EMR as appropriate.   OBJECTIVE:  Vital signs reviewed. GENERAL: Well-developed, well-nourished, no acute distress. CARDIOVASCULAR: Regular rate and rhythm no murmur gallop or rub LUNGS: Clear to auscultation bilaterally, no rales or wheeze. ABDOMEN: Soft positive bowel sounds NEURO: Resting tremor upper extremities and head.  Orofacial dyskinesias specifically of the lips.  Some dysarthria. MSK: Movement of extremity x 4.  Well-healed right knee scar from TKR.  Left knee tender to palpation medial joint line and lateral joint line.  Evidence of chronic synovitis.  Flexion extension full.  No effusion. PROCEDURE: INJECTION: Patient was given informed consent, signed copy in the chart. Appropriate time out was taken. Area prepped and draped in usual sterile fashion. Ethyl chloride was  used for local anesthesia. A 21 gauge 1 1/2 inch needle was used.. 1 cc of methylprednisolone 40 mg/ml plus 4 cc of 1% lidocaine without epinephrine was injected into the left knee using a(n) anterior medial approach.   The patient tolerated the procedure well. There were no complications. Post procedure instructions were given.     ASSESSMENT / PLAN:   Essential hypertension Not sure why she quit  taking her metoprolol but we definitely need to restart.  I would like to see her back in about a month for blood pressure check.  We can discuss whether or not she wants to restart propranolol for her tremor at that time.  First I like to see whether or not we have her blood pressure controlled.   Left knee pain Significant DJD left knee.  She really needs a total knee replacement but does not want to do that at this point.  Corticosteroid injection today.  Follow-up as needed or if she changes her mind about TKR  Parkinson's disease Continues to follow with neurology.  She did have some questions about restarting propranolol.  Will rediscuss after she gets back on her metoprolol for her blood pressure.

## 2018-12-02 NOTE — Assessment & Plan Note (Signed)
Not sure why she quit taking her metoprolol but we definitely need to restart.  I would like to see her back in about a month for blood pressure check.  We can discuss whether or not she wants to restart propranolol for her tremor at that time.  First I like to see whether or not we have her blood pressure controlled.

## 2018-12-02 NOTE — Assessment & Plan Note (Signed)
Significant DJD left knee.  She really needs a total knee replacement but does not want to do that at this point.  Corticosteroid injection today.  Follow-up as needed or if she changes her mind about TKR

## 2018-12-02 NOTE — Assessment & Plan Note (Signed)
Continues to follow with neurology.  She did have some questions about restarting propranolol.  Will rediscuss after she gets back on her metoprolol for her blood pressure.

## 2018-12-03 DIAGNOSIS — I421 Obstructive hypertrophic cardiomyopathy: Secondary | ICD-10-CM | POA: Diagnosis not present

## 2018-12-06 ENCOUNTER — Other Ambulatory Visit: Payer: Self-pay | Admitting: Family Medicine

## 2018-12-06 ENCOUNTER — Other Ambulatory Visit: Payer: Self-pay | Admitting: *Deleted

## 2018-12-06 DIAGNOSIS — R0602 Shortness of breath: Secondary | ICD-10-CM

## 2018-12-08 ENCOUNTER — Other Ambulatory Visit: Payer: Self-pay | Admitting: *Deleted

## 2018-12-08 DIAGNOSIS — R0602 Shortness of breath: Secondary | ICD-10-CM

## 2018-12-14 ENCOUNTER — Institutional Professional Consult (permissible substitution): Payer: Medicare HMO | Admitting: Emergency Medicine

## 2018-12-16 ENCOUNTER — Other Ambulatory Visit: Payer: Self-pay | Admitting: Family Medicine

## 2018-12-28 ENCOUNTER — Ambulatory Visit: Payer: Medicare HMO | Admitting: Family Medicine

## 2019-01-04 ENCOUNTER — Other Ambulatory Visit: Payer: Self-pay

## 2019-01-04 ENCOUNTER — Encounter: Payer: Self-pay | Admitting: Emergency Medicine

## 2019-01-04 ENCOUNTER — Ambulatory Visit: Payer: Medicare HMO | Admitting: Emergency Medicine

## 2019-01-04 VITALS — BP 114/84 | HR 58 | Wt 182.0 lb

## 2019-01-04 DIAGNOSIS — R0683 Snoring: Secondary | ICD-10-CM

## 2019-01-04 DIAGNOSIS — R0609 Other forms of dyspnea: Secondary | ICD-10-CM

## 2019-01-04 DIAGNOSIS — R0600 Dyspnea, unspecified: Secondary | ICD-10-CM

## 2019-01-04 NOTE — Progress Notes (Signed)
Subjective:    Patient ID: [name], female    DOB: [birth year], 73 y.o.   MRN: [id number]  HPI 73 year old never smoker with a history of diabetes, hypertension with mild diastolic dysfunction, GERD with history of esophageal strictures and hiatal hernia, parkinsonism, osteoarthritis, history of atypical chest pressure/chest pain (reassuring nuclear stress test 2016).  She is referred today for evaluation of shortness of breath.  Did not have asthma as a child  She reports that she continues to have mid chest pain - feels different from reflux, which she also has frequently. More like a tightness.  She has dyspnea with almost any exertion, even walking through the house. Able to walk around the house 1-2 times, or to her car in our parking lot, then would have to stop. Has been progressive over the last 2 yrs. Her activity has decreased significantly since COVID isolation.  No wheeze, occasional coughing. She has dysphasia, can get choked, seems to happen several times a week. She describes poor sleep, poor initiation and maintenance. She probably snores.   TTE 09/17/2018 >> estimated RVSP 45 mmHg MRI heart7/13 >> no evidence amyloid, sarcoidosis, etc.  CXR June 2020 reviewed >> no infiltrates. V/q scan 09/2017 >> low prob for for PE   Review of Systems  Constitutional: Negative for fever and unexpected weight change.  HENT: Positive for dental problem. Negative for congestion, ear pain, nosebleeds, postnasal drip, rhinorrhea, sinus pressure, sneezing, sore throat and trouble swallowing.   Eyes: Negative for redness and itching.  Respiratory: Positive for cough and shortness of breath. Negative for chest tightness and wheezing.   Cardiovascular: Negative for palpitations and leg swelling.  Gastrointestinal: Negative for nausea and vomiting.  Genitourinary: Negative for dysuria.  Musculoskeletal: Negative for joint swelling.  Skin: Negative for rash.  Neurological: Negative for  headaches.  Hematological: Does not bruise/bleed easily.  Psychiatric/Behavioral: Negative for dysphoric mood. The patient is not nervous/anxious.     Past Medical History:  Diagnosis Date  . Allergy   . Anxiety   . Arthritis    "knees, back" (06/29/2014)  . ARTHRITIS, BACK 03/28/2009   Qualifier: Diagnosis of  By: Nori Riis MD, [name]    . CARPAL TUNNEL SYNDROME, LEFT 01/05/2008   Qualifier: Diagnosis of  By: Nori Riis MD, [name]    . Cataract   . Chronic bronchitis ([place])    "get it q yr"  . Chronic mid back pain   . Cognitive impairment 12/11/2013  . Complication of anesthesia 07/2008   "hard to get me woke up when I had my knee replaced; they said they had to bring me back"  . Dementia ([place])    "I have some; not dx'd by a dr" (08/06/2017)  . Diabetes mellitus type 2, diet-controlled ([place]) 08/05/2006       . Disorder of bursae and tendons in shoulder region 01/24/2009   Qualifier: Diagnosis of  By: Nori Riis MD, [name]    . Esophageal abnormality 08/12/2010   Barium swallow 12/2011  IMPRESSION: Esophageal dysmotility.  No fixed esophageal narrowing or stricture. However, the barium tablet was transiently delayed at the GE junction.  Postsurgical changes at the GE junction. Gastroesophageal reflux is suspected, but could not be confirmed due to the patient's inability to clear her esophagus in the prone position.    . Esophageal stricture   . GERD (gastroesophageal reflux disease)   . History of gout 1970's  . Hyperlipidemia   . Hypertension   . Major depressive disorder, recurrent episode ([place])  06/11/2006   Qualifier: Diagnosis of  By: Beryle Lathe    . Movement disorder   . Neuromuscular disorder ([place])   . Osteoarthritis of multiple joints 08/04/2006   S/p R TKR Significant OA continues to be a problem in her left knee. Lower back OA Complicated by her parkinsonism    . Parkinson's disease ([place])   . 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 SCOLIOSIS 03/28/2009   Qualifier: Diagnosis  of  By: Nori Riis MD, [name]    . Situational depression   . Substance abuse ([place])   . Type II diabetes mellitus ([place])   . UTI (lower urinary tract infection)      Family History  Problem Relation Age of Onset  . Heart disease Mother   . Diabetes Mother   . Heart disease Father   . Heart attack Sister   . Heart disease Sister   . Heart attack Brother   . Heart disease Brother   . Cerebral palsy Son   . Colon cancer Neg Hx   . Esophageal cancer Neg Hx   . Liver cancer Neg Hx   . Pancreatic cancer Neg Hx   . Stomach cancer Neg Hx   . Rectal cancer Neg Hx      Social History   Socioeconomic History  . Marital status: Married    Spouse name: [name]  . Number of children: 2  . Years of education: 23  . Highest education level: Not on file  Occupational History  . Occupation: Retired    Fish farm manager: NOT EMPLOYED  Social Needs  . Financial resource strain: Not on file  . Food insecurity    Worry: Not on file    Inability: Not on file  . Transportation needs    Medical: Not on file    Non-medical: Not on file  Tobacco Use  . Smoking status: Never Smoker  . Smokeless tobacco: Never Used  Substance and Sexual Activity  . Alcohol use: Yes    Frequency: Never    Comment: Occasional  . Drug use: No  . Sexual activity: Not on file  Lifestyle  . Physical activity    Days per week: Not on file    Minutes per session: Not on file  . Stress: Not on file  Relationships  . Social Herbalist on phone: Not on file    Gets together: Not on file    Attends religious service: Not on file    Active member of club or organization: Not on file    Attends meetings of clubs or organizations: Not on file    Relationship status: Not on file  . Intimate partner violence    Fear of current or ex partner: Not on file    Emotionally abused: Not on file    Physically abused: Not on file    Forced sexual activity: Not on file  Other Topics Concern  . Not on file  Social History  Narrative   Health Care POA:    Emergency Contact: daughter, [name], [phone] husband, [name] [phone]   End of Life Plan:   Who lives with you: Lives with husband and son, [name]   Any pets: Rabbit, [name]   Diet: Patient has a varied diet but struggles with what to eat with hypertension, diabetes, parkinsons   Exercise: Patient does not have any regular exercise routine.   Seatbelts: Patient reports wearing her seatbelt when in vehicle.   Nancy Fetter Exposure/Protection: Patient reports wearing  sun block lotion daily.   Hobbies: Watching game shows, writing poetry, writing in journal, volunteering at day program with son.    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 Ace Inhibitors Anaphylaxis and Swelling  . Azilect [Rasagiline Mesylate] Other (See Comments)    hypotension  . Lisinopril Swelling    Severe facial angioedema requiring hospitalization 2007 (approx)     Outpatient Medications Prior to Visit  Medication Sig Dispense Refill  . fluticasone (FLONASE) 50 MCG/ACT nasal spray SHAKE LIQUID AND USE 2 SPRAYS IN EACH NOSTRIL DAILY 16 g 12  . gabapentin (NEURONTIN) 300 MG capsule Take 300-600 mg by mouth See admin instructions. Take  2 capsules in the morning and 2 capsules at lunch 3 capsule in the evening as needed    . metoprolol succinate (TOPROL-XL) 25 MG 24 hr tablet Take 3 tablets (75 mg total) by mouth daily. 270 tablet 3  . omeprazole (PRILOSEC) 40 MG capsule TAKE 1 CAPSULE BY MOUTH TWICE DAILY 90 capsule 3  . sertraline (ZOLOFT) 100 MG tablet Take 100 mg by mouth daily. Per psych    . sucralfate (CARAFATE) 1 GM/10ML suspension TAKE 10MLS BY MOUTH FOUR TIMES DAILY WITH MEALS AND AT BEDTIME 420 mL 3  . traMADol (ULTRAM) 50 MG tablet TAKE 1 TO 2 TABLETS BY MOUTH TWICE DAILY AS NEEDED FOR CHRONIC KNEE PAIN 120 tablet 3  . furosemide (LASIX) 20 MG tablet  Take 1 tablet (20 mg total) by mouth as needed for edema (Swelling). 45 tablet 3   No facility-administered medications prior to visit.         Objective:   Physical Exam Vitals:   01/04/19 1543  BP: 114/84  Pulse: (!) 58  SpO2: 95%  Weight: 182 lb (82.6 kg)   Gen: Pleasant, obese woman, in no distress,  normal affect  ENT: No lesions,  mouth clear,  oropharynx clear, no postnasal drip, some left ptosis  Neck: No JVD, no stridor  Lungs: No use of accessory muscles, no crackles or wheezing on normal respiration, no wheeze on forced expiration  Cardiovascular: RRR, heart sounds normal, no murmur or gallops, no peripheral edema  Musculoskeletal: No deformities, no cyanosis or clubbing  Neuro: alert, awake, non focal, mild tremor  Skin: Warm, no lesions or rash      Assessment & Plan:  Dyspnea on exertion Longstanding dyspnea on exertion, worse over the last 2 years.  Her evaluation so far has been reassuring for any evidence of coronary disease, shows some pulmonary hypertension by echocardiogram.  VQ scan low probability, chest x-ray clear.  Agree that she needs pulmonary function testing and a sleep study.  These have been discussed with her but neither has been scheduled.  We will arrange.  She needs walking oximetry today to ensure no occult desaturation.  Suspect that her shortness of breath is multifactorial.  She does have some deconditioning since her activity has decreased with the progression of parkinsonism and the recent COVID isolation efforts.  We will perform full pulmonary function testing at your next office visit We will perform walking oximetry on room air today. We will schedule your split-night sleep study to evaluate for sleep apnea Follow with Dr Lamonte Sakai next available with full PFT after your sleep study to review the results together.  Baltazar Apo, MD, PhD 01/04/2019, 4:16 PM  [facility] [phone] or if no answer  [phone]

## 2019-01-04 NOTE — Addendum Note (Signed)
Addended by: Desmond Dike C on: 01/04/2019 04:24 PM   Modules accepted: Orders

## 2019-01-04 NOTE — Assessment & Plan Note (Signed)
Longstanding dyspnea on exertion, worse over the last 2 years.  Her evaluation so far has been reassuring for any evidence of coronary disease, shows some pulmonary hypertension by echocardiogram.  VQ scan low probability, chest x-ray clear.  Agree that she needs pulmonary function testing and a sleep study.  These have been discussed with her but neither has been scheduled.  We will arrange.  She needs walking oximetry today to ensure no occult desaturation.  Suspect that her shortness of breath is multifactorial.  She does have some deconditioning since her activity has decreased with the progression of parkinsonism and the recent COVID isolation efforts.  We will perform full pulmonary function testing at your next office visit We will perform walking oximetry on room air today. We will schedule your split-night sleep study to evaluate for sleep apnea Follow with Dr Lamonte Sakai next available with full PFT after your sleep study to review the results together.

## 2019-01-04 NOTE — Patient Instructions (Addendum)
We will perform full pulmonary function testing at your next office visit We will perform walking oximetry on room air today. We will schedule your split-night sleep study to evaluate for sleep apnea Follow with Dr Lamonte Sakai next available with full PFT after your sleep study to review the results together.

## 2019-01-20 DIAGNOSIS — F3341 Major depressive disorder, recurrent, in partial remission: Secondary | ICD-10-CM | POA: Diagnosis not present

## 2019-02-01 ENCOUNTER — Other Ambulatory Visit: Payer: Self-pay | Admitting: *Deleted

## 2019-02-01 MED ORDER — TRAMADOL HCL 50 MG PO TABS: ORAL_TABLET | ORAL | 3 refills | Status: DC

## 2019-02-02 ENCOUNTER — Other Ambulatory Visit: Payer: Self-pay

## 2019-02-02 ENCOUNTER — Ambulatory Visit (INDEPENDENT_AMBULATORY_CARE_PROVIDER_SITE_OTHER): Payer: Medicare HMO | Admitting: Family Medicine

## 2019-02-02 VITALS — BP 148/86 | HR 106 | Wt 174.4 lb

## 2019-02-02 DIAGNOSIS — Z23 Encounter for immunization: Secondary | ICD-10-CM | POA: Diagnosis not present

## 2019-02-02 DIAGNOSIS — M545 Low back pain, unspecified: Secondary | ICD-10-CM

## 2019-02-02 DIAGNOSIS — G20 Parkinson's disease: Secondary | ICD-10-CM | POA: Diagnosis not present

## 2019-02-02 DIAGNOSIS — N3000 Acute cystitis without hematuria: Secondary | ICD-10-CM | POA: Insufficient documentation

## 2019-02-02 MED ORDER — CEPHALEXIN 500 MG PO CAPS: 500.0000 mg | ORAL_CAPSULE | Freq: Two times a day (BID) | ORAL | 0 refills | Status: DC

## 2019-02-02 NOTE — Assessment & Plan Note (Signed)
Patient's tramadol appears to have been refilled on 10/20, so she was told to go to her pharmacy to pick this up.  He was also encouraged to try topical medication such as EMLA cream, Voltaren gel, IcyHot, and to use a heat pad as long as someone is there to ensure that she is not using it for too long.  Ordered home health PT and encourage patient to be as active as she can tolerate.

## 2019-02-02 NOTE — Progress Notes (Signed)
Subjective:    [name] - 73 y.o. female MRN [id number]  Date of birth: [birth year]  CC:  [name] is here for lower back pain and confusion.  HPI: Back pain This is a chronic problem and is worse in her lower back.  Patient says that she has run out of her tramadol, which may be exacerbating her pain currently.  She also says that she fell out of her bed 2 weeks ago which has exacerbated her back pain.  Patient and her daughter report that she is not sleeping well, but lies down for most of the day.  Tramadol seems to be somewhat helpful.  She has trouble walking with her Rollator, so she does not use it much.  She denies urinary or fecal incontinence and saddle anesthesia.  Worsening memory Patient and her daughter say that she has had some short-term memory problems recently and will call family members and then promptly forget that she has done so.  She continues to take the carbidopa levodopa although it is not on her medication list currently.  She plans to see her neurologist on 10/26.  She continues to have difficulties with anger, but she does not want to increase the dose of her sertraline because she does not want people to "call me crazy."  She is saddened by her poor quality of life due to her Parkinson's.  Urinary symptoms Patient endorses urinary frequency and urgency.  She denies dysuria but says that she has never had dysuria with UTIs.  Her daughter is concerned that her confusion and back pain could be due to a UTI.  Patient says that she has been hospitalized before with a UTI.  Her symptoms now feel like symptoms of prior UTIs.  Health Maintenance:  Health Maintenance Due  Topic Date Due  . Hepatitis C Screening  06-27-1945  . FOOT EXAM  03/30/1955  . URINE MICROALBUMIN  08/05/2012  . TETANUS/TDAP  08/12/2013  . COLONOSCOPY  11/01/2015  . MAMMOGRAM  04/11/2016  . OPHTHALMOLOGY EXAM  03/16/2018  . INFLUENZA VACCINE  11/13/2018    -  reports that  she has never smoked. She has never used smokeless tobacco. - Review of Systems: Per HPI. - Past Medical History: Patient Active Problem List   Diagnosis Date Noted  . Acute cystitis without hematuria 02/02/2019  . Hiatal hernia with GERD and esophagitis 08/20/2017  . Acute esophagitis   . Hematemesis 08/04/2017  . Hematochezia 08/04/2017  . Osteoarthritis of left knee 07/28/2017  . Dyspnea on exertion 11/14/2016  . Frequent falls 05/14/2016  . Tinnitus 02/15/2015  . CKD stage 3 due to type 2 diabetes mellitus ([place]) 02/15/2015  . Chronic pain syndrome 06/15/2014  . Left knee pain 05/17/2014  . Urticaria, idiopathic 12/21/2013  . Cognitive impairment 12/11/2013  . Dysphagia, unspecified(787.20) 06/02/2013  . Parkinson's disease ([place]) 01/18/2013  . 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 Substance abuse, episodic 09/20/2010  . Esophageal abnormality 08/12/2010  . ARTHRITIS, BACK 03/28/2009  . SCOLIOSIS 03/28/2009  . Disorder of bursae and tendons in shoulder region 01/24/2009  . CARPAL TUNNEL SYNDROME, LEFT 01/05/2008  . Diabetes mellitus type 2, diet-controlled ([place]) 08/05/2006  . Hyperlipidemia 08/05/2006  . Essential hypertension 08/05/2006  . Osteoarthritis of multiple joints 08/04/2006  . Major depressive disorder, recurrent episode ([place]) 06/11/2006   - Medications: reviewed and updated   Objective:   Physical Exam BP (!) 148/86   Pulse (!) 106   Wt 174  lb 6.4 oz (79.1 kg)   SpO2 96%   BMI 29.94 kg/m  Gen: NAD, alert, cooperative with exam, well-appearing HEENT: NCAT, PERRL, clear conjunctiva, oropharynx clear, supple neck CV: slightly tachycardic, regular rhythm Resp: CTABL, no wheezes, non-labored Musculoskeletal: No point tenderness to patient's spine, although tenderness to palpation is present in the bilateral lumbar paraspinal muscles.  Patient would not allow me to examine her further.       Assessment & Plan:   Parkinson's disease  Patient's parkinsonism is likely causing her worsening memory as well as her mood disorder.  Encouraged patient to follow-up with her neurologist on Monday.  I will also order a home health physical therapist to work with the patient regarding balance and modifications for increased safety at home.  This may also help with her back pain.  ARTHRITIS, BACK Patient's tramadol appears to have been refilled on 10/20, so she was told to go to her pharmacy to pick this up.  He was also encouraged to try topical medication such as EMLA cream, Voltaren gel, IcyHot, and to use a heat pad as long as someone is there to ensure that she is not using it for too long.  Ordered home health PT and encourage patient to be as active as she can tolerate.  Acute cystitis without hematuria Unfortunately, patient was unable to provide an adequate urine sample, but given her history of hospitalization due to UTI and her symptoms similar to these previous UTIs, will empirically treat with Keflex 500 mg twice daily for 5 days.  Patient was given return precautions, including dysuria, fever, and increasing urinary frequency and urgency.    Maia Breslow, M.D. 02/02/2019, 12:10 PM PGY-3, [place]

## 2019-02-02 NOTE — Patient Instructions (Signed)
It was nice meeting you today Ms. Haile!  For your possible UTI, I am sending in Keflex, which you should take once in the morning and once in the evening for 5 days.  I am also ordering home physical therapy to help you be more active and get stronger.  For your back pain, your tramadol has already been called in.  Please also try Voltaren gel, EMLA cream, icy hot, and a heating pad as long as someone make sure that your heating pad is not on your back for too long.  Please make sure to go to your neurologist's appointment on Monday.  If you have any questions or concerns, please feel free to call the clinic.   Be well,  Dr. Shan Levans

## 2019-02-02 NOTE — Assessment & Plan Note (Signed)
Unfortunately, patient was unable to provide an adequate urine sample, but given her history of hospitalization due to UTI and her symptoms similar to these previous UTIs, will empirically treat with Keflex 500 mg twice daily for 5 days.  Patient was given return precautions, including dysuria, fever, and increasing urinary frequency and urgency.

## 2019-02-02 NOTE — Assessment & Plan Note (Signed)
Patient's parkinsonism is likely causing her worsening memory as well as her mood disorder.  Encouraged patient to follow-up with her neurologist on Monday.  I will also order a home health physical therapist to work with the patient regarding balance and modifications for increased safety at home.  This may also help with her back pain.

## 2019-02-04 DIAGNOSIS — N3000 Acute cystitis without hematuria: Secondary | ICD-10-CM | POA: Diagnosis not present

## 2019-02-04 DIAGNOSIS — G20 Parkinson's disease: Secondary | ICD-10-CM | POA: Diagnosis not present

## 2019-02-04 DIAGNOSIS — G894 Chronic pain syndrome: Secondary | ICD-10-CM | POA: Diagnosis not present

## 2019-02-04 DIAGNOSIS — I129 Hypertensive chronic kidney disease with stage 1 through stage 4 chronic kidney disease, or unspecified chronic kidney disease: Secondary | ICD-10-CM | POA: Diagnosis not present

## 2019-02-04 DIAGNOSIS — M19042 Primary osteoarthritis, left hand: Secondary | ICD-10-CM | POA: Diagnosis not present

## 2019-02-04 DIAGNOSIS — M19041 Primary osteoarthritis, right hand: Secondary | ICD-10-CM | POA: Diagnosis not present

## 2019-02-04 DIAGNOSIS — M1710 Unilateral primary osteoarthritis, unspecified knee: Secondary | ICD-10-CM | POA: Diagnosis not present

## 2019-02-04 DIAGNOSIS — M47819 Spondylosis without myelopathy or radiculopathy, site unspecified: Secondary | ICD-10-CM | POA: Diagnosis not present

## 2019-02-04 DIAGNOSIS — M545 Low back pain: Secondary | ICD-10-CM | POA: Diagnosis not present

## 2019-02-07 ENCOUNTER — Ambulatory Visit (INDEPENDENT_AMBULATORY_CARE_PROVIDER_SITE_OTHER): Payer: Medicare HMO | Admitting: Diagnostic Neuroimaging

## 2019-02-07 ENCOUNTER — Encounter: Payer: Self-pay | Admitting: Diagnostic Neuroimaging

## 2019-02-07 ENCOUNTER — Other Ambulatory Visit: Payer: Self-pay

## 2019-02-07 VITALS — BP 154/67 | HR 60 | Temp 96.9°F | Ht 64.0 in | Wt 183.2 lb

## 2019-02-07 DIAGNOSIS — G20 Parkinson's disease: Secondary | ICD-10-CM | POA: Diagnosis not present

## 2019-02-07 MED ORDER — CARBIDOPA-LEVODOPA 25-100 MG PO TABS: 1.0000 | ORAL_TABLET | Freq: Every day | ORAL | 4 refills | Status: DC

## 2019-02-07 NOTE — Progress Notes (Signed)
[facility]  PATIENT: [name] DOB: 01-11-1946  REFERRING CLINICIAN:  HISTORY FROM: patient and husband REASON FOR VISIT: follow up   HISTORICAL  CHIEF COMPLAINT:  Chief Complaint  Patient presents with   Parkinson's disease    rm 6, 1 yr FU, dgtr- [name] "no problems until delusions started last week, lasted 3 days, I got panicky"    HISTORY OF PRESENT ILLNESS:   UPDATE (02/07/19, VRP): Since last visit, doing poorly. More confusion, anxiety, memory lapses. Symptoms are progressive. Severity is moderate. No alleviating or aggravating factors. Tolerating meds.    UPDATE (02/01/18, VRP): Since last visit, doing well. Symptoms are improving. Tolerating carb/levo. Dx'd with severe ulcerative reflux esophagitis. No alleviating or aggravating factors. Tolerating carb/levo 1 tab 5x per day.    UPDATE (07/27/17, VRP): Since last visit, doing worse with excessive movements, anxiety, falls. Tolerating carb/levo (2 tabs 8am, 2 tabs 6pm, 1 tab at 11pm). Now having more nausea, coffee ground emesis, and occ black stools. Some excessive movements, but not correlated with meds.  UPDATE (01/14/17, VRP): Since last visit, doing fair. More anger and rage issues, esp towards husband. Some nausea with carb/levo. Now on carb/levio 1.5 tabs TID, and better tremor control. No falls.  UPDATE 09/09/16 (VRP): Since last visit, no more falls. Now off any benzos or sleep aids. Using walker at times, but mainly walking stick. [facility] care mgmt has reached out to help patient. Now on carb/levo 1 / 1 / 0.5. Continues with nausea.   UPDATE 05/05/16 (MM): "She returns today after experiencing increased falls in the last 2 months. She states that some of the falls have been severe and she has fallen on her face. Fortunately she has not suffered any significant injuries. She does state that her left shoulder and neck is slightly tight after one of the falls. She is not using an assistive  device. She states that she did purchase a walker last week. Her husband states that she tends to drag her feet when ambulating. Reports that she had physical therapy over a year ago. She is currently taking Sinemet 25-100 milligrams one and a half tablets 3 times a day. She takes her first dose at 8:30, second dose at 3 PM and last dose at 9:30 PM. She states that she can tell a difference if she misses a dose. Denies any changes in her sleep. She states that if she does not chew her food up well she will become choked. Denies getting choked on liquids. Reports that she does have a tremor in the hands and mouth. She does note that she shuffles her feet when ambulating. She returns today for an evaluation."  UPDATE 03/12/16: Since last visit, continues to have falls; ~ 5 x in last 6 months. Not using cane or walker. Had episode of believing that she went to a party in [place], [name] (which she did not) and then told her family about it. No further delusions or hallucinations. Has been on lower carb/levo (1 tab twice a day) lately due to nausea.  UPDATE 09/03/15: Since last visit, stable. Did well with PT. Doing well with lower dose of carb/levo.   UPDATE 02/13/15: Since last visit, continues to have tremor, tongue and mouth movments (better), sweating, anxiety, inverted sleep schedule.   UPDATE 10/09/14: Since last visit, had increased carb/levo up to 2.5 tabs TID (even though I had recommended going up to 2 tabs TID; she's not sure why she did this). More  tongue and mouth movements. More falls. Went to hospital for UTI, AKI, N/V/diarrhea In March 2016.   UPDATE 04/04/14: Since last visit, she reports that she is stable from PD standpoint. Tremor and gait are stable. Tolerating carb/levo 1.5 tabs TID. Hasn't tried going up to 2 tabs TID. Re: swallowing issues, she reports remote esophagus surgery for swallow issues 15 yrs ago. Feels that this is a problem again, and wants to see GI. Re: rash, it turns out  that they had bedbugs in their home; now problem is resolved. Struggling with diarrhea and excess sweating.  UPDATE 12/26/13: Since last visit, was hospitalized (2 weeks ago) for delirium, hallucinations, chest pain, UTI, and medication overuse. Was taking ambien and a friend's xanax to help her sleep (was struggling with anxiety and insomnia; not seen Dr. Casimiro Needle in a while). Now back at home and doing better. Tremor and gait are worsening. Some wearing off (early AM and late evening). Also with 2 weeks are night time itching and rash. Going to see dermatology soon.   UPDATE 01/18/13: Since last visit, now on carb/levo 1.5 tabs TID. Nausea is improved since she started taking her metformin at different time than carb/levo. Tremor, balance, coordination are stable.  UPDATE 06/01/12: Since last visit, tried carb/levo 1.5 tabs TID x 1 week, then stopped. Felt like it was too much medicine. Did not have increased side effects. Has been struggling with nausea throughout. Tremor is persistent.  UPDATE 01/28/12: Doing well. No wearing off. Tolerating meds. Stopped azilect (per PCP for nausea). Now on fluoxetine for depression.  UPDATE 08/27/10: Doing better on carb/levo.  Taking 1 tab TID (6am, 4pm, 9pm).  Minimal nausea.  Wakes up with more tremor in AM, then reduction in tremor 58min after dose.  Effect wears off around 4pm.  Daughter reports some intermittent confusion.  PRIOR HPI (06/26/10): 73 yo Caucasian female referred to [place] for tremor with concern for possible Parkinson's disease. [name] notes she first noticed a resting tremor in her left hand about 3 years ago, and this has gotten progressively worse since then. It has also spread to now involve her mouth and right hand as well, though she notes it is still worst in her left hand. Stress and anxiety can accentuate the tremors, whereas active use can reduce them. She also notes the tremors being worse in the morning. She denies noting anything else  that seems to make the tremors better or worse. She admits to what seems like possible bradykinesia, in her words that she has "a hard time getting going sometimes," but denies any freezing. She does note some new difficulty with writing, but denies micrographia. She also admits to feeling like her balance and walking is "a bit off," but she relates this more to the osteoarthritis in her knees and having had surgery on her R knee. She denies orthostatic symptoms, hallucinations or delusions, difficulty standing or sitting, weakness, new visual changes (aside from her macular degeneration), or feelings of rigidity. She also denies bizarre dreams, nightmares, RLS symptoms, or REM behavior disorder symptoms. She admits to having two uncles who have tremors, etiology unclear, but adds that one uncle had alcoholism (and it was believed this was the cause). She is concerned about what is causing her tremors, and admits that although she initially "put off" being evaluated further, she is anxious to know what might be the cause of her tremors.   REVIEW OF SYSTEMS: Full 14 system review of systems performed and negative except  for: as per HPI.   ALLERGIES: Allergies  Allergen Reactions   Ace Inhibitors Anaphylaxis and Swelling   Azilect [Rasagiline Mesylate] Other (See Comments)    hypotension   Lisinopril Swelling    Severe facial angioedema requiring hospitalization 2007 (approx)    HOME MEDICATIONS: Outpatient Medications Prior to Visit  Medication Sig Dispense Refill   cephALEXin (KEFLEX) 500 MG capsule Take 1 capsule (500 mg total) by mouth 2 (two) times daily. 10 capsule 0   fluticasone (FLONASE) 50 MCG/ACT nasal spray SHAKE LIQUID AND USE 2 SPRAYS IN EACH NOSTRIL DAILY 16 g 12   gabapentin (NEURONTIN) 300 MG capsule Take 300-600 mg by mouth See admin instructions. Take  2 capsules in the morning and 2 capsules at lunch 3 capsule in the evening as needed     metoprolol succinate  (TOPROL-XL) 25 MG 24 hr tablet Take 3 tablets (75 mg total) by mouth daily. 270 tablet 3   omeprazole (PRILOSEC) 40 MG capsule TAKE 1 CAPSULE BY MOUTH TWICE DAILY 90 capsule 3   sertraline (ZOLOFT) 100 MG tablet Take 100 mg by mouth daily. Per psych     sucralfate (CARAFATE) 1 GM/10ML suspension TAKE 10MLS BY MOUTH FOUR TIMES DAILY WITH MEALS AND AT BEDTIME 420 mL 3   traMADol (ULTRAM) 50 MG tablet TAKE 1 TO 2 TABLETS BY MOUTH TWICE DAILY AS NEEDED FOR CHRONIC KNEE PAIN 120 tablet 3   furosemide (LASIX) 20 MG tablet Take 1 tablet (20 mg total) by mouth as needed for edema (Swelling). 45 tablet 3   No facility-administered medications prior to visit.     PAST MEDICAL HISTORY: Past Medical History:  Diagnosis Date   Allergy    Anxiety    Arthritis    "knees, back" (06/29/2014)   ARTHRITIS, BACK 03/28/2009   Qualifier: Diagnosis of  By: Nori Riis MD, Ludwig Lean SYNDROME, LEFT 01/05/2008   Qualifier: Diagnosis of  By: Nori Riis MD, [name]     Cataract    Chronic bronchitis [facility])    "get it q yr"   Chronic mid back pain    Cognitive impairment [id number]   Complication of anesthesia 07/2008   "hard to get me woke up when I had my knee replaced; they said they had to bring me back"   Dementia [facility])    "I have some; not dx'd by a dr" (08/06/2017)   Diabetes mellitus type 2, diet-controlled ([place]) 08/05/2006        Disorder of bursae and tendons in shoulder region 01/24/2009   Qualifier: Diagnosis of  By: Nori Riis MD, Sara     Esophageal abnormality 08/12/2010   Barium swallow 12/2011  IMPRESSION: Esophageal dysmotility.  No fixed esophageal narrowing or stricture. However, the barium tablet was transiently delayed at the GE junction.  Postsurgical changes at the GE junction. Gastroesophageal reflux is suspected, but could not be confirmed due to the patient's inability to clear her esophagus in the prone position.     Esophageal stricture    GERD (gastroesophageal reflux  disease)    History of gout 1970's   Hyperlipidemia    Hypertension    Major depressive disorder, recurrent episode ([place]) 06/11/2006   Qualifier: Diagnosis of  By: Beryle Lathe     Movement disorder    Neuromuscular disorder ([place])    Osteoarthritis of multiple joints 08/04/2006   S/p R TKR Significant OA continues to be a problem in her left knee. Lower back OA Complicated by her parkinsonism  Parkinson's disease ([place])    S/P TKR (total knee replacement), RIGHT 06/09/2012   SCOLIOSIS 03/28/2009   Qualifier: Diagnosis of  By: Nori Riis MD, [name]     Situational depression    Substance abuse ([place])    Type II diabetes mellitus ([place])    UTI (lower urinary tract infection)     PAST SURGICAL HISTORY: Past Surgical History:  Procedure Laterality Date   ABDOMINAL HYSTERECTOMY     CHOLECYSTECTOMY OPEN     COLON SURGERY     DILATION AND CURETTAGE OF UTERUS     ESOPHAGOGASTRIC FUNDOPLASTY     some type "esoph surgery" per pt   ESOPHAGOGASTRODUODENOSCOPY (EGD) WITH PROPOFOL N/A 08/05/2017   Procedure: ESOPHAGOGASTRODUODENOSCOPY (EGD) WITH PROPOFOL;  Surgeon: Jerene Bears, MD;  Location: [place];  Service: Gastroenterology;  Laterality: N/A;   JOINT REPLACEMENT     TOTAL KNEE ARTHROPLASTY Right 07/2008   TUBAL LIGATION      FAMILY HISTORY: Family History  Problem Relation Age of Onset   Heart disease Mother    Diabetes Mother    Heart disease Father    Heart attack Sister    Heart disease Sister    Heart attack Brother    Heart disease Brother    Cerebral palsy Son    Colon cancer Neg Hx    Esophageal cancer Neg Hx    Liver cancer Neg Hx    Pancreatic cancer Neg Hx    Stomach cancer Neg Hx    Rectal cancer Neg Hx     SOCIAL HISTORY:  Social History   Socioeconomic History   Marital status: Married    Spouse name: [name]   Number of children: 2   Years of education: 14   Highest education level: Not on file  Occupational  History   Occupation: Retired    Fish farm manager: NOT EMPLOYED  Social Designer, fashion/clothing strain: Not on file   Food insecurity    Worry: Not on file    Inability: Not on Lexicographer needs    Medical: Not on file    Non-medical: Not on file  Tobacco Use   Smoking status: Never Smoker   Smokeless tobacco: Never Used  Substance and Sexual Activity   Alcohol use: Yes    Frequency: Never    Comment: Occasional   Drug use: No   Sexual activity: Not on file  Lifestyle   Physical activity    Days per week: Not on file    Minutes per session: Not on file   Stress: Not on file  Relationships   Social connections    Talks on phone: Not on file    Gets together: Not on file    Attends religious service: Not on file    Active member of club or organization: Not on file    Attends meetings of clubs or organizations: Not on file    Relationship status: Not on file   Intimate partner violence    Fear of current or ex partner: Not on file    Emotionally abused: Not on file    Physically abused: Not on file    Forced sexual activity: Not on file  Other Topics Concern   Not on file  Social History Narrative   Health Care POA:    Emergency Contact: daughter, [name], [phone] husband, [name], [place]:   Who lives with you: Lives with husband 02/07/19   Any pets: Rabbit, Cloyde Reams  Diet: Patient has a varied diet but struggles with what to eat with hypertension, diabetes, parkinsons   Exercise: Patient does not have any regular exercise routine.   Seatbelts: Patient reports wearing her seatbelt when in vehicle.   Nancy Fetter Exposure/Protection: Patient reports wearing sun block lotion daily.   Hobbies: Watching game shows, writing poetry, writing in journal, volunteering at day program with son.    Caffeine Use: very little occasional      02/07/19 caregiver 2 hrs 2-3 x weekly    PHYSICAL EXAM  Vitals:   02/07/19 1432  BP: (!) 154/67   Pulse: 60  Temp: (!) 96.9 F (36.1 C)  Weight: 183 lb 3.2 oz (83.1 kg)  Height: 5\' 4"  (1.626 m)    Not recorded      Body mass index is 31.45 kg/m.  GENERAL EXAM: General: Patient is awake, alert and in no acute distress.  Well developed and groomed. Neck: Neck is supple. Cardiovascular: No carotid artery bruits.  Heart is regular rate and rhythm with no murmurs.  Neurologic Exam  Mental Status: Awake, alert. Language is fluent and comprehension intact. Cranial Nerves: Pupils are equal and reactive to light.  Visual fields are full to confrontation.  Conjugate eye movements are full and symmetric.  Facial sensation and strength are symmetric.  Hearing is intact.  Palate elevated symmetrically and uvula is midline.  Shoulder shrug is symmetric.  Tongue is midline. NO OROLINGUAL DYSKINESIAS. Motor: MILD ORO-LINGUAL DYSKINESIAS; MILD RESTING TREMOR IN BUE; NO POSTURAL TREMOR IN BUE; MODERATE BRADYKINESIA AND RIGIDITY IN BUE AND BLE; diffuse 4/5 strength in the upper and lower extremities. Sensory: Intact and symmetric to light touch. Coordination: No ataxia or dysmetria on finger-nose or rapid alternating movement testing. Reflexes: BUE and BLE present and symmetric Gait and Station: Narrow based gait. SMOOTH GAIT, USING ROLLATOR   DIAGNOSTIC DATA (LABS, IMAGING, TESTING) - I reviewed patient records, labs, notes, testing and imaging myself where available.  Lab Results  Component Value Date   WBC 9.8 01/13/2018   HGB 11.9 01/13/2018   HCT 36.2 01/13/2018   MCV 86 01/13/2018   PLT 333 01/13/2018      Component Value Date/Time   NA 144 09/27/2018 1317   K 3.8 09/27/2018 1317   CL 103 09/27/2018 1317   CO2 22 09/27/2018 1317   GLUCOSE 157 (H) 09/27/2018 1317   GLUCOSE 98 08/05/2017 0257   BUN 14 09/27/2018 1317   CREATININE 1.31 (H) 09/27/2018 1317   CREATININE 1.07 (H) 02/27/2016 1029   CALCIUM 10.1 09/27/2018 1317   PROT 6.8 06/08/2018 1238   ALBUMIN 4.3 06/08/2018  1238   AST 21 06/08/2018 1238   ALT 10 06/08/2018 1238   ALKPHOS 88 06/08/2018 1238   BILITOT 0.4 06/08/2018 1238   GFRNONAA 40 (L) 09/27/2018 1317   GFRNONAA 53 (L) 02/27/2016 1029   GFRAA 47 (L) 09/27/2018 1317   GFRAA 61 02/27/2016 1029   Lab Results  Component Value Date   CHOL 244 (H) 06/07/2014   HDL 38.60 (L) 06/07/2014   LDLCALC 123 (H) 12/11/2013   LDLDIRECT 179 (H) 02/27/2016   TRIG 384.0 (H) 06/07/2014   CHOLHDL 6 06/07/2014   Lab Results  Component Value Date   HGBA1C 6.0 12/01/2018   Lab Results  Component Value Date   VITAMINB12 301 01/05/2008   Lab Results  Component Value Date   TSH 2.260 11/26/2016    07/05/10 MRI BRAIN - minimal scattered chronic small vessel ischemic disease  ASSESSMENT AND PLAN  73 y.o. female with resting tremor of BUE, cogwheel rigidity, bradykinesia, decreased arm swing, anxiety, diff swallowing, memory loss; consistent with idiopathic parkinson's disease.    Dx:  Parkinson disease ([place])    PLAN:  PARKINSON'S DISEASE (established problem, worsening) - continue carb/levo (25/100) --> 1 tab 5 times per day - increase supervision at home with medications and ADLs - use rollator walker at all times  CONFUSION / MEMORY LAPSE / MOOD DISORDER (worsening) - follow up with Dr. Casimiro Needle; continue sertraline - caution with excessive alcohol use (no more that 1 drink per day)  Meds ordered this encounter  Medications   carbidopa-levodopa (SINEMET IR) 25-100 MG tablet    Sig: Take 1 tablet by mouth 5 (five) times daily.    Dispense:  450 tablet    Refill:  4   Return in about 1 year (around 02/07/2020).    Penni Bombard, MD [id number], [id number] PM Certified in Neurology, Neurophysiology and Neuroimaging  [facility] [address], [place], [place] [address] [phone]

## 2019-02-08 DIAGNOSIS — M1710 Unilateral primary osteoarthritis, unspecified knee: Secondary | ICD-10-CM | POA: Diagnosis not present

## 2019-02-08 DIAGNOSIS — M19041 Primary osteoarthritis, right hand: Secondary | ICD-10-CM | POA: Diagnosis not present

## 2019-02-08 DIAGNOSIS — M545 Low back pain: Secondary | ICD-10-CM | POA: Diagnosis not present

## 2019-02-08 DIAGNOSIS — G20 Parkinson's disease: Secondary | ICD-10-CM | POA: Diagnosis not present

## 2019-02-08 DIAGNOSIS — G894 Chronic pain syndrome: Secondary | ICD-10-CM | POA: Diagnosis not present

## 2019-02-08 DIAGNOSIS — I129 Hypertensive chronic kidney disease with stage 1 through stage 4 chronic kidney disease, or unspecified chronic kidney disease: Secondary | ICD-10-CM | POA: Diagnosis not present

## 2019-02-08 DIAGNOSIS — M47819 Spondylosis without myelopathy or radiculopathy, site unspecified: Secondary | ICD-10-CM | POA: Diagnosis not present

## 2019-02-08 DIAGNOSIS — N3000 Acute cystitis without hematuria: Secondary | ICD-10-CM | POA: Diagnosis not present

## 2019-02-08 DIAGNOSIS — M19042 Primary osteoarthritis, left hand: Secondary | ICD-10-CM | POA: Diagnosis not present

## 2019-02-09 DIAGNOSIS — G20 Parkinson's disease: Secondary | ICD-10-CM | POA: Diagnosis not present

## 2019-02-09 DIAGNOSIS — N3000 Acute cystitis without hematuria: Secondary | ICD-10-CM | POA: Diagnosis not present

## 2019-02-09 DIAGNOSIS — M545 Low back pain: Secondary | ICD-10-CM | POA: Diagnosis not present

## 2019-02-09 DIAGNOSIS — G894 Chronic pain syndrome: Secondary | ICD-10-CM | POA: Diagnosis not present

## 2019-02-09 DIAGNOSIS — M19041 Primary osteoarthritis, right hand: Secondary | ICD-10-CM | POA: Diagnosis not present

## 2019-02-09 DIAGNOSIS — M1710 Unilateral primary osteoarthritis, unspecified knee: Secondary | ICD-10-CM | POA: Diagnosis not present

## 2019-02-09 DIAGNOSIS — M19042 Primary osteoarthritis, left hand: Secondary | ICD-10-CM | POA: Diagnosis not present

## 2019-02-09 DIAGNOSIS — I129 Hypertensive chronic kidney disease with stage 1 through stage 4 chronic kidney disease, or unspecified chronic kidney disease: Secondary | ICD-10-CM | POA: Diagnosis not present

## 2019-02-09 DIAGNOSIS — M47819 Spondylosis without myelopathy or radiculopathy, site unspecified: Secondary | ICD-10-CM | POA: Diagnosis not present

## 2019-02-11 DIAGNOSIS — M47819 Spondylosis without myelopathy or radiculopathy, site unspecified: Secondary | ICD-10-CM | POA: Diagnosis not present

## 2019-02-11 DIAGNOSIS — M545 Low back pain: Secondary | ICD-10-CM | POA: Diagnosis not present

## 2019-02-11 DIAGNOSIS — I129 Hypertensive chronic kidney disease with stage 1 through stage 4 chronic kidney disease, or unspecified chronic kidney disease: Secondary | ICD-10-CM | POA: Diagnosis not present

## 2019-02-11 DIAGNOSIS — N3000 Acute cystitis without hematuria: Secondary | ICD-10-CM | POA: Diagnosis not present

## 2019-02-11 DIAGNOSIS — G20 Parkinson's disease: Secondary | ICD-10-CM | POA: Diagnosis not present

## 2019-02-11 DIAGNOSIS — M19042 Primary osteoarthritis, left hand: Secondary | ICD-10-CM | POA: Diagnosis not present

## 2019-02-11 DIAGNOSIS — M19041 Primary osteoarthritis, right hand: Secondary | ICD-10-CM | POA: Diagnosis not present

## 2019-02-11 DIAGNOSIS — M1710 Unilateral primary osteoarthritis, unspecified knee: Secondary | ICD-10-CM | POA: Diagnosis not present

## 2019-02-11 DIAGNOSIS — G894 Chronic pain syndrome: Secondary | ICD-10-CM | POA: Diagnosis not present

## 2019-02-17 DIAGNOSIS — N3000 Acute cystitis without hematuria: Secondary | ICD-10-CM | POA: Diagnosis not present

## 2019-02-17 DIAGNOSIS — G894 Chronic pain syndrome: Secondary | ICD-10-CM | POA: Diagnosis not present

## 2019-02-17 DIAGNOSIS — M19042 Primary osteoarthritis, left hand: Secondary | ICD-10-CM | POA: Diagnosis not present

## 2019-02-17 DIAGNOSIS — I129 Hypertensive chronic kidney disease with stage 1 through stage 4 chronic kidney disease, or unspecified chronic kidney disease: Secondary | ICD-10-CM | POA: Diagnosis not present

## 2019-02-17 DIAGNOSIS — M545 Low back pain: Secondary | ICD-10-CM | POA: Diagnosis not present

## 2019-02-17 DIAGNOSIS — M1710 Unilateral primary osteoarthritis, unspecified knee: Secondary | ICD-10-CM | POA: Diagnosis not present

## 2019-02-17 DIAGNOSIS — M19041 Primary osteoarthritis, right hand: Secondary | ICD-10-CM | POA: Diagnosis not present

## 2019-02-17 DIAGNOSIS — M47819 Spondylosis without myelopathy or radiculopathy, site unspecified: Secondary | ICD-10-CM | POA: Diagnosis not present

## 2019-02-17 DIAGNOSIS — G20 Parkinson's disease: Secondary | ICD-10-CM | POA: Diagnosis not present

## 2019-02-18 DIAGNOSIS — M19041 Primary osteoarthritis, right hand: Secondary | ICD-10-CM | POA: Diagnosis not present

## 2019-02-18 DIAGNOSIS — M19042 Primary osteoarthritis, left hand: Secondary | ICD-10-CM | POA: Diagnosis not present

## 2019-02-18 DIAGNOSIS — M545 Low back pain: Secondary | ICD-10-CM | POA: Diagnosis not present

## 2019-02-18 DIAGNOSIS — N3000 Acute cystitis without hematuria: Secondary | ICD-10-CM | POA: Diagnosis not present

## 2019-02-18 DIAGNOSIS — G894 Chronic pain syndrome: Secondary | ICD-10-CM | POA: Diagnosis not present

## 2019-02-18 DIAGNOSIS — M47819 Spondylosis without myelopathy or radiculopathy, site unspecified: Secondary | ICD-10-CM | POA: Diagnosis not present

## 2019-02-18 DIAGNOSIS — I129 Hypertensive chronic kidney disease with stage 1 through stage 4 chronic kidney disease, or unspecified chronic kidney disease: Secondary | ICD-10-CM | POA: Diagnosis not present

## 2019-02-18 DIAGNOSIS — G20 Parkinson's disease: Secondary | ICD-10-CM | POA: Diagnosis not present

## 2019-02-18 DIAGNOSIS — M1710 Unilateral primary osteoarthritis, unspecified knee: Secondary | ICD-10-CM | POA: Diagnosis not present

## 2019-02-22 DIAGNOSIS — N3000 Acute cystitis without hematuria: Secondary | ICD-10-CM | POA: Diagnosis not present

## 2019-02-22 DIAGNOSIS — M545 Low back pain: Secondary | ICD-10-CM | POA: Diagnosis not present

## 2019-02-22 DIAGNOSIS — I129 Hypertensive chronic kidney disease with stage 1 through stage 4 chronic kidney disease, or unspecified chronic kidney disease: Secondary | ICD-10-CM | POA: Diagnosis not present

## 2019-02-22 DIAGNOSIS — M19042 Primary osteoarthritis, left hand: Secondary | ICD-10-CM | POA: Diagnosis not present

## 2019-02-22 DIAGNOSIS — G894 Chronic pain syndrome: Secondary | ICD-10-CM | POA: Diagnosis not present

## 2019-02-22 DIAGNOSIS — M19041 Primary osteoarthritis, right hand: Secondary | ICD-10-CM | POA: Diagnosis not present

## 2019-02-22 DIAGNOSIS — G20 Parkinson's disease: Secondary | ICD-10-CM | POA: Diagnosis not present

## 2019-02-22 DIAGNOSIS — M1710 Unilateral primary osteoarthritis, unspecified knee: Secondary | ICD-10-CM | POA: Diagnosis not present

## 2019-02-22 DIAGNOSIS — M47819 Spondylosis without myelopathy or radiculopathy, site unspecified: Secondary | ICD-10-CM | POA: Diagnosis not present

## 2019-03-01 DIAGNOSIS — M545 Low back pain: Secondary | ICD-10-CM | POA: Diagnosis not present

## 2019-03-01 DIAGNOSIS — G20 Parkinson's disease: Secondary | ICD-10-CM | POA: Diagnosis not present

## 2019-03-01 DIAGNOSIS — N3000 Acute cystitis without hematuria: Secondary | ICD-10-CM | POA: Diagnosis not present

## 2019-03-01 DIAGNOSIS — I129 Hypertensive chronic kidney disease with stage 1 through stage 4 chronic kidney disease, or unspecified chronic kidney disease: Secondary | ICD-10-CM | POA: Diagnosis not present

## 2019-03-01 DIAGNOSIS — M19041 Primary osteoarthritis, right hand: Secondary | ICD-10-CM | POA: Diagnosis not present

## 2019-03-01 DIAGNOSIS — M1710 Unilateral primary osteoarthritis, unspecified knee: Secondary | ICD-10-CM | POA: Diagnosis not present

## 2019-03-01 DIAGNOSIS — M19042 Primary osteoarthritis, left hand: Secondary | ICD-10-CM | POA: Diagnosis not present

## 2019-03-01 DIAGNOSIS — M47819 Spondylosis without myelopathy or radiculopathy, site unspecified: Secondary | ICD-10-CM | POA: Diagnosis not present

## 2019-03-01 DIAGNOSIS — G894 Chronic pain syndrome: Secondary | ICD-10-CM | POA: Diagnosis not present

## 2019-03-23 ENCOUNTER — Ambulatory Visit: Payer: Medicare HMO | Admitting: Family Medicine

## 2019-04-04 DIAGNOSIS — F3341 Major depressive disorder, recurrent, in partial remission: Secondary | ICD-10-CM | POA: Diagnosis not present

## 2019-04-20 ENCOUNTER — Other Ambulatory Visit: Payer: Self-pay

## 2019-04-20 ENCOUNTER — Encounter: Payer: Self-pay | Admitting: Family Medicine

## 2019-04-20 ENCOUNTER — Ambulatory Visit (INDEPENDENT_AMBULATORY_CARE_PROVIDER_SITE_OTHER): Payer: Medicare HMO | Admitting: Family Medicine

## 2019-04-20 VITALS — BP 172/94 | HR 97 | Wt 177.6 lb

## 2019-04-20 DIAGNOSIS — E119 Type 2 diabetes mellitus without complications: Secondary | ICD-10-CM

## 2019-04-20 DIAGNOSIS — M1712 Unilateral primary osteoarthritis, left knee: Secondary | ICD-10-CM | POA: Diagnosis not present

## 2019-04-20 DIAGNOSIS — Z1211 Encounter for screening for malignant neoplasm of colon: Secondary | ICD-10-CM

## 2019-04-20 DIAGNOSIS — G20 Parkinson's disease: Secondary | ICD-10-CM

## 2019-04-20 DIAGNOSIS — Z1231 Encounter for screening mammogram for malignant neoplasm of breast: Secondary | ICD-10-CM | POA: Diagnosis not present

## 2019-04-20 LAB — POCT GLYCOSYLATED HEMOGLOBIN (HGB A1C): HbA1c, POC (controlled diabetic range): 5.7 % (ref 0.0–7.0)

## 2019-04-20 NOTE — Patient Instructions (Signed)
Great to see you! I will send you a note about your labs.

## 2019-04-21 LAB — COMPREHENSIVE METABOLIC PANEL
ALT: 6 IU/L (ref 0–32)
AST: 20 IU/L (ref 0–40)
Albumin/Globulin Ratio: 1.9 (ref 1.2–2.2)
Albumin: 4.7 g/dL (ref 3.7–4.7)
Alkaline Phosphatase: 109 IU/L (ref 39–117)
BUN/Creatinine Ratio: 16 (ref 12–28)
BUN: 21 mg/dL (ref 8–27)
Bilirubin Total: 0.4 mg/dL (ref 0.0–1.2)
CO2: 24 mmol/L (ref 20–29)
Calcium: 10 mg/dL (ref 8.7–10.3)
Chloride: 105 mmol/L (ref 96–106)
Creatinine, Ser: 1.35 mg/dL — ABNORMAL HIGH (ref 0.57–1.00)
GFR calc Af Amer: 45 mL/min/{1.73_m2} — ABNORMAL LOW (ref >59)
GFR calc non Af Amer: 39 mL/min/{1.73_m2} — ABNORMAL LOW (ref >59)
Globulin, Total: 2.5 g/dL (ref 1.5–4.5)
Glucose: 103 mg/dL — ABNORMAL HIGH (ref 65–99)
Potassium: 3.9 mmol/L (ref 3.5–5.2)
Sodium: 143 mmol/L (ref 134–144)
Total Protein: 7.2 g/dL (ref 6.0–8.5)

## 2019-04-21 LAB — LDL CHOLESTEROL, DIRECT: LDL Direct: 174 mg/dL — ABNORMAL HIGH (ref 0–99)

## 2019-04-22 ENCOUNTER — Encounter: Payer: Self-pay | Admitting: Family Medicine

## 2019-04-22 NOTE — Assessment & Plan Note (Signed)
Doing actually pretty well right now.  No need for injection therapy today.

## 2019-04-22 NOTE — Assessment & Plan Note (Signed)
Check labs today.

## 2019-04-22 NOTE — Assessment & Plan Note (Signed)
Having significantly more issues with tremor.  Also having some hallucinations and this is bothering her quite a bit.  She follows up with neurology.

## 2019-04-22 NOTE — Progress Notes (Signed)
    CHIEF COMPLAINT / HPI: Follow-up diabetes.  Follow-up chronic knee pain.  Wants me to get her scheduled for mammogram and colonoscopy as she is ready to do those things now  PERTINENT  PMH / PSH: I have reviewed the patient's medications, allergies, past medical and surgical history, smoking status and updated in the EMR as appropriate.    ASSESSMENT / PLAN:   Parkinson's disease Having significantly more issues with tremor.  Also having some hallucinations and this is bothering her quite a bit.  She follows up with neurology.  Diabetes mellitus type 2, diet-controlled ([place]) Check labs today  Osteoarthritis of left knee Doing actually pretty well right now.  No need for injection therapy today.    Referred her for colonoscopy and mammogram.  Orders placed.  Follow-up 3 months, sooner if problems.

## 2019-04-28 ENCOUNTER — Encounter: Payer: Self-pay | Admitting: Internal Medicine

## 2019-05-10 ENCOUNTER — Telehealth: Payer: Self-pay | Admitting: *Deleted

## 2019-05-10 NOTE — Telephone Encounter (Signed)
She saw PCP after neurology and referral came from PCP indicating that PCP feels colonoscopy should be considered Let's have her come to see an APP to discuss appropriateness of [place] colonoscopy Thanks Margrett Rud, APP visit to discuss recent referral for colonoscopy

## 2019-05-10 NOTE — Telephone Encounter (Signed)
Dr. Hilarie Fredrickson,  Would you review this pt's chart please?  She is going to be coming in for a screening colonoscopy.  She does have a history of Parkinson's and according to the neurology note from 02-07-19 she is doing poorly and is having more confusion and memory issues.  Is she ok for a direct to [place]?  Thanks, [employer]

## 2019-05-10 NOTE — Telephone Encounter (Signed)
Spoke with pt and scheduled her to see Dr. Hilarie Fredrickson 05/18/19@2 :30pm. Colon appt not cancelled yet, will wait to cancel once pt sees MD to find out if needs to be cancelled or not.

## 2019-05-17 ENCOUNTER — Encounter: Payer: Self-pay | Admitting: *Deleted

## 2019-05-18 ENCOUNTER — Ambulatory Visit: Payer: Medicare HMO | Admitting: Internal Medicine

## 2019-05-23 DIAGNOSIS — F3341 Major depressive disorder, recurrent, in partial remission: Secondary | ICD-10-CM | POA: Diagnosis not present

## 2019-05-25 NOTE — Telephone Encounter (Signed)
Pt needs an appt with a PA to see if she is appropriate for procedure in the [facility]. She was scheduled to see Dr. Hilarie Fredrickson 2/3 but was not seen. Please schedule her to be seen by a PA.

## 2019-05-25 NOTE — Telephone Encounter (Signed)
Pt called to ask whether colonoscopy needs to be cancelled.  She is not booked for a previsit.

## 2019-05-27 ENCOUNTER — Ambulatory Visit: Payer: Medicare HMO

## 2019-05-31 ENCOUNTER — Encounter: Payer: Self-pay | Admitting: Physician Assistant

## 2019-05-31 ENCOUNTER — Other Ambulatory Visit: Payer: Self-pay

## 2019-05-31 ENCOUNTER — Ambulatory Visit: Payer: Medicare HMO | Admitting: Physician Assistant

## 2019-05-31 ENCOUNTER — Encounter: Payer: Medicare HMO | Admitting: Internal Medicine

## 2019-05-31 VITALS — BP 130/70 | HR 60 | Temp 96.7°F | Ht 64.0 in | Wt 174.0 lb

## 2019-05-31 DIAGNOSIS — R194 Change in bowel habit: Secondary | ICD-10-CM

## 2019-05-31 DIAGNOSIS — R197 Diarrhea, unspecified: Secondary | ICD-10-CM | POA: Diagnosis not present

## 2019-05-31 MED ORDER — NA SULFATE-K SULFATE-MG SULF 17.5-3.13-1.6 GM/177ML PO SOLN: ORAL | 0 refills | Status: DC

## 2019-05-31 NOTE — Progress Notes (Signed)
Subjective:    Patient ID: [name], female    DOB: [birth year], 74 y.o.   MRN: [id number]  HPI [name] is a pleasant 74 year old white female, established with Dr. Hilarie Fredrickson, who is referred today to discuss screening colonoscopy.  She has history of hypertension, chronic GERD, prior acute ulcerative esophagitis diagnosed by EGD July 2019, chronic kidney disease stage III, adult onset diabetes mellitus, episodic substance abuse, depression, mild cognitive impairment and Parkinson's disease. She had recently been seen by neurology who felt that she had had some decline with increasing episodes of confusion and memory lapses.  She has not had any changes in her meds recently.  She is maintained on Sinemet, Neurontin and Zoloft. Patient believes that she had prior colonoscopy per Dr. Collene Mares 5 or 6 years ago.  She knows she had diverticulosis but does not recall whether she had polyps.  She is fairly insistent that she is due for follow-up colonoscopy, and says she had received notification from her insurance company that she was due for follow-up colonoscopy. Patient herself is concerned because she has had ongoing issues with intermittent diarrhea and difficulty controlling her bowels over the past few years.  She does have episodes of incontinence.  She says she usually has 2 or 3 days of diarrhea, then takes a couple of Imodium and will not have a bowel movement for 2 to 3 days.  She feels constipated passes some hard stool and then goes right back to diarrhea.  She is not having any ongoing abdominal pain or cramping.  She has not noticed any melena or hematochezia.  She has had her initial COVID-19 vaccination and is due for second dose 06/20/2019  Review of Systems.Pertinent positive and negative review of systems were noted in the above HPI section.  All other review of systems was otherwise negative.  Outpatient Encounter Medications as of 05/31/2019  Medication Sig  . carbidopa-levodopa  (SINEMET IR) 25-100 MG tablet Take 1 tablet by mouth 5 (five) times daily.  . cephALEXin (KEFLEX) 500 MG capsule Take 1 capsule (500 mg total) by mouth 2 (two) times daily.  . fluticasone (FLONASE) 50 MCG/ACT nasal spray SHAKE LIQUID AND USE 2 SPRAYS IN EACH NOSTRIL DAILY  . gabapentin (NEURONTIN) 300 MG capsule Take 300-600 mg by mouth See admin instructions. Take  2 capsules in the morning and 2 capsules at lunch 3 capsule in the evening as needed  . metoprolol succinate (TOPROL-XL) 25 MG 24 hr tablet Take 3 tablets (75 mg total) by mouth daily.  Marland Kitchen omeprazole (PRILOSEC) 40 MG capsule TAKE 1 CAPSULE BY MOUTH TWICE DAILY  . sertraline (ZOLOFT) 100 MG tablet Take 100 mg by mouth daily. Per psych  . sucralfate (CARAFATE) 1 GM/10ML suspension TAKE 10MLS BY MOUTH FOUR TIMES DAILY WITH MEALS AND AT BEDTIME  . traMADol (ULTRAM) 50 MG tablet TAKE 1 TO 2 TABLETS BY MOUTH TWICE DAILY AS NEEDED FOR CHRONIC KNEE PAIN  . furosemide (LASIX) 20 MG tablet Take 1 tablet (20 mg total) by mouth as needed for edema (Swelling).  . Na Sulfate-K Sulfate-Mg Sulf 17.5-3.13-1.6 GM/177ML SOLN Suprep-Use as directed   No facility-administered encounter medications on file as of 05/31/2019.   Allergies  Allergen Reactions  . Ace Inhibitors Anaphylaxis and Swelling  . Azilect [Rasagiline Mesylate] Other (See Comments)    hypotension  . Lisinopril Swelling    Severe facial angioedema requiring hospitalization 2007 (approx)   Patient Active Problem List   Diagnosis Date Noted  . Acute  cystitis without hematuria 02/02/2019  . Hiatal hernia with GERD and esophagitis 08/20/2017  . Acute esophagitis   . Hematemesis 08/04/2017  . Hematochezia 08/04/2017  . Osteoarthritis of left knee 07/28/2017  . Dyspnea on exertion 11/14/2016  . Frequent falls 05/14/2016  . Tinnitus 02/15/2015  . CKD stage 3 due to type 2 diabetes mellitus ([place]) 02/15/2015  . Chronic pain syndrome 06/15/2014  . Left knee pain 05/17/2014  .  Urticaria, idiopathic 12/21/2013  . Cognitive impairment 12/11/2013  . Dysphagia, unspecified(787.20) 06/02/2013  . Parkinson's disease ([place]) 01/18/2013  . 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 Substance abuse, episodic 09/20/2010  . Esophageal abnormality 08/12/2010  . ARTHRITIS, BACK 03/28/2009  . SCOLIOSIS 03/28/2009  . Disorder of bursae and tendons in shoulder region 01/24/2009  . CARPAL TUNNEL SYNDROME, LEFT 01/05/2008  . Diabetes mellitus type 2, diet-controlled ([place]) 08/05/2006  . Hyperlipidemia 08/05/2006  . Essential hypertension 08/05/2006  . Osteoarthritis of multiple joints 08/04/2006  . Major depressive disorder, recurrent episode ([place]) 06/11/2006   Social History   Socioeconomic History  . Marital status: Married    Spouse name: [name]  . Number of children: 2  . Years of education: 84  . Highest education level: Not on file  Occupational History  . Occupation: Retired    Fish farm manager: NOT EMPLOYED  Tobacco Use  . Smoking status: Never Smoker  . Smokeless tobacco: Never Used  Substance and Sexual Activity  . Alcohol use: Yes    Comment: Occasional  . Drug use: No  . Sexual activity: Not on file  Other Topics Concern  . Not on file  Social History Narrative   Health Care POA:    Emergency Contact: daughter, [name], [phone] husband, [name] [place]:   Who lives with you: Lives with husband 02/07/19   Any pets: Rabbit, [name]   Diet: Patient has a varied diet but struggles with what to eat with hypertension, diabetes, parkinsons   Exercise: Patient does not have any regular exercise routine.   Seatbelts: Patient reports wearing her seatbelt when in vehicle.   [name]: Patient reports wearing sun block lotion daily.   Hobbies: Watching game shows, writing poetry, writing in journal, volunteering at day program with son.    Caffeine Use: very little occasional      02/07/19 caregiver 2 hrs 2-3 x  weekly   Social Determinants of Health   Financial Resource Strain:   . Difficulty of Paying Living Expenses: Not on file  Food Insecurity:   . Worried About Charity fundraiser in the Last Year: Not on file  . Ran Out of Food in the Last Year: Not on file  Transportation Needs:   . Lack of Transportation (Medical): Not on file  . Lack of Transportation (Non-Medical): Not on file  Physical Activity:   . Days of Exercise per Week: Not on file  . Minutes of Exercise per Session: Not on file  Stress:   . Feeling of Stress : Not on file  Social Connections:   . Frequency of Communication with Friends and Family: Not on file  . Frequency of Social Gatherings with Friends and Family: Not on file  . Attends Religious Services: Not on file  . Active Member of Clubs or Organizations: Not on file  . Attends Archivist Meetings: Not on file  . Marital Status: Not on file  Intimate Partner Violence:   . Fear of Current or Ex-Partner: Not  on file  . Emotionally Abused: Not on file  . Physically Abused: Not on file  . Sexually Abused: Not on file    Ms. Lapine's family history includes Cerebral palsy in her son; Diabetes in her mother; Heart attack in her brother and sister; Heart disease in her brother, father, mother, and sister.      Objective:    Vitals:   05/31/19 1444  BP: 130/70  Pulse: 60  Temp: (!) 96.7 F (35.9 C)    Physical Exam Well-developed well-nourished elderly female in no acute distress.  Somewhat chronically ill-appearing, ambulates with a rolling walker.  Accompanied by her husband  Weight 174, BMI 29.8  HEENT; nontraumatic normocephalic, EOMI, PE RR LA, sclera anicteric. Oropharynx; not examined Neck; supple, no JVD Cardiovascular; regular rate and rhythm with S1-S2, no murmur rub or gallop Pulmonary; Clear bilaterally Abdomen; soft, nontender, nondistended, no palpable mass or hepatosplenomegaly, bowel sounds are active Rectal; not done  today Skin; benign exam, no jaundice rash or appreciable lesions Extremities; no clubbing cyanosis or edema skin warm and dry Neuro/Psych; alert and oriented x4, grossly nonfocal mood and affect appropriate, minimal tremor, she is somewhat unsteady ambulating but was able to get up on the exam table without assistance, though complaining of dizziness       Assessment & Plan:   #48 74 year old white female with Parkinson's disease, with mild cognitive impairment and some recent increase in confusion and memory lapses as per neurology notes. #2 frequent diarrhea and intermittent episodes of fecal incontinence.  She currently manages diarrhea with Imodium, which then causes constipation.-This may be medication induced/question Zoloft, or secondary to Parkinson's #3 colon cancer screening-date of previous colonoscopy and findings not available at visit today.  #4 adult onset diabetes mellitus 5.  Chronic kidney disease stage III 6.  GERD-stable 7.  History of depression  Plan; patient is signed a release and have requested records from Dr. Juanita Craver regarding previous colonoscopy and path We will go ahead and schedule for colonoscopy with Dr. Hilarie Fredrickson, as she is symptomatic with diarrhea.  Procedure was discussed in detail with the patient including indications risks and benefits and she is agreeable to proceed. Have asked her to try 1 Imodium p.o. every morning, or QOD on a regular basis. Further recommendations pending records and colonoscopy findings.  Masyn Fullam Genia Harold PA-C 05/31/2019   Cc: [name], MD

## 2019-05-31 NOTE — Patient Instructions (Signed)
If you are age 75 or older, your body mass index should be between 23-30. Your Body mass index is 29.87 kg/m. If this is out of the aforementioned range listed, please consider follow up with your Primary Care Provider.  If you are age 19 or younger, your body mass index should be between 19-25. Your Body mass index is 29.87 kg/m. If this is out of the aformentioned range listed, please consider follow up with your Primary Care Provider.   You have been scheduled for a colonoscopy. Please follow written instructions given to you at your visit today.  Please pick up your prep supplies at the pharmacy within the next 1-3 days. If you use inhalers (even only as needed), please bring them with you on the day of your procedure. Your physician has requested that you go to www.startemmi.com and enter the access code given to you at your visit today. This web site gives a general overview about your procedure. However, you should still follow specific instructions given to you by our office regarding your preparation for the procedure.  We have sent the following medications to your pharmacy for you to pick up at your convenience: Suprep  Take one Imodium every other day.  We are requesting records from Dr. Collene Mares.  Thank you for choosing me and [facility].   Amy Esterwood, PA-C

## 2019-06-01 NOTE — Progress Notes (Signed)
Addendum: Reviewed and agree with assessment and management plan. Lea Walbert M, MD  

## 2019-06-02 ENCOUNTER — Encounter: Payer: Medicare HMO | Admitting: Internal Medicine

## 2019-06-07 ENCOUNTER — Telehealth: Payer: Self-pay

## 2019-06-07 NOTE — Telephone Encounter (Signed)
Patient calls nurse line stating her parkinson disease medication makes her nauseous. Patient is requesting nausea medication. Please advise. Patient uses [facility] on [address].

## 2019-06-08 ENCOUNTER — Other Ambulatory Visit: Payer: Self-pay

## 2019-06-08 NOTE — Telephone Encounter (Signed)
Error

## 2019-06-10 MED ORDER — ONDANSETRON HCL 4 MG PO TABS: 4.0000 mg | ORAL_TABLET | Freq: Three times a day (TID) | ORAL | 0 refills | Status: DC | PRN

## 2019-06-10 NOTE — Addendum Note (Signed)
Addended byDickie La on: 06/10/2019 02:59 PM   Modules accepted: Orders

## 2019-07-04 ENCOUNTER — Other Ambulatory Visit: Payer: Self-pay

## 2019-07-04 ENCOUNTER — Ambulatory Visit
Admission: RE | Admit: 2019-07-04 | Discharge: 2019-07-04 | Disposition: A | Discharge status: Home / Self Care | Payer: Medicare HMO | Source: Ambulatory Visit | Attending: Family Medicine | Admitting: Family Medicine

## 2019-07-04 DIAGNOSIS — Z1231 Encounter for screening mammogram for malignant neoplasm of breast: Secondary | ICD-10-CM | POA: Diagnosis not present

## 2019-07-06 ENCOUNTER — Encounter: Payer: Self-pay | Admitting: Internal Medicine

## 2019-07-08 ENCOUNTER — Ambulatory Visit (AMBULATORY_SURGERY_CENTER): Payer: Medicare HMO | Admitting: Internal Medicine

## 2019-07-08 ENCOUNTER — Other Ambulatory Visit: Payer: Self-pay

## 2019-07-08 ENCOUNTER — Encounter: Payer: Self-pay | Admitting: Internal Medicine

## 2019-07-08 VITALS — BP 142/59 | HR 76 | Temp 97.7°F | Resp 18

## 2019-07-08 DIAGNOSIS — D122 Benign neoplasm of ascending colon: Secondary | ICD-10-CM | POA: Diagnosis not present

## 2019-07-08 DIAGNOSIS — R197 Diarrhea, unspecified: Secondary | ICD-10-CM | POA: Diagnosis not present

## 2019-07-08 DIAGNOSIS — I10 Essential (primary) hypertension: Secondary | ICD-10-CM | POA: Diagnosis not present

## 2019-07-08 DIAGNOSIS — K5730 Diverticulosis of large intestine without perforation or abscess without bleeding: Secondary | ICD-10-CM

## 2019-07-08 DIAGNOSIS — Z1211 Encounter for screening for malignant neoplasm of colon: Secondary | ICD-10-CM

## 2019-07-08 DIAGNOSIS — D123 Benign neoplasm of transverse colon: Secondary | ICD-10-CM | POA: Diagnosis not present

## 2019-07-08 DIAGNOSIS — D125 Benign neoplasm of sigmoid colon: Secondary | ICD-10-CM | POA: Diagnosis not present

## 2019-07-08 DIAGNOSIS — G20 Parkinson's disease: Secondary | ICD-10-CM | POA: Diagnosis not present

## 2019-07-08 DIAGNOSIS — D124 Benign neoplasm of descending colon: Secondary | ICD-10-CM

## 2019-07-08 DIAGNOSIS — E119 Type 2 diabetes mellitus without complications: Secondary | ICD-10-CM | POA: Diagnosis not present

## 2019-07-08 DIAGNOSIS — R194 Change in bowel habit: Secondary | ICD-10-CM | POA: Diagnosis not present

## 2019-07-08 DIAGNOSIS — F0390 Unspecified dementia without behavioral disturbance: Secondary | ICD-10-CM | POA: Diagnosis not present

## 2019-07-08 HISTORY — PX: COLONOSCOPY: SHX174

## 2019-07-08 MED ORDER — NYSTATIN-TRIAMCINOLONE 100000-0.1 UNIT/GM-% EX OINT: 1.0000 "application " | TOPICAL_OINTMENT | Freq: Three times a day (TID) | CUTANEOUS | 1 refills | Status: DC

## 2019-07-08 MED ORDER — SODIUM CHLORIDE 0.9 % IV SOLN: 500.0000 mL | Freq: Once | INTRAVENOUS | Status: DC

## 2019-07-08 NOTE — Patient Instructions (Signed)
Please read handouts provided. Continue present medications. Await pathology results. Benefiber 1 heaping tablespoon daily to help with bowel movements. Nystatin ointment apply three times daily to rash on bottom.      YOU HAD AN ENDOSCOPIC PROCEDURE TODAY AT [facility]:   Refer to the procedure report that was given to you for any specific questions about what was found during the examination.  If the procedure report does not answer your questions, please call your gastroenterologist to clarify.  If you requested that your care partner not be given the details of your procedure findings, then the procedure report has been included in a sealed envelope for you to review at your convenience later.  YOU SHOULD EXPECT: Some feelings of bloating in the abdomen. Passage of more gas than usual.  Walking can help get rid of the air that was put into your GI tract during the procedure and reduce the bloating. If you had a lower endoscopy (such as a colonoscopy or flexible sigmoidoscopy) you may notice spotting of blood in your stool or on the toilet paper. If you underwent a bowel prep for your procedure, you may not have a normal bowel movement for a few days.  Please Note:  You might notice some irritation and congestion in your nose or some drainage.  This is from the oxygen used during your procedure.  There is no need for concern and it should clear up in a day or so.  SYMPTOMS TO REPORT IMMEDIATELY:   Following lower endoscopy (colonoscopy or flexible sigmoidoscopy):  Excessive amounts of blood in the stool  Significant tenderness or worsening of abdominal pains  Swelling of the abdomen that is new, acute  Fever of 100F or higher    For urgent or emergent issues, a gastroenterologist can be reached at any hour by calling [phone]. Do not use MyChart messaging for urgent concerns.    DIET:  We do recommend a small meal at first, but then you may proceed to your  regular diet.  Drink plenty of fluids but you should avoid alcoholic beverages for 24 hours.  ACTIVITY:  You should plan to take it easy for the rest of today and you should NOT DRIVE or use heavy machinery until tomorrow (because of the sedation medicines used during the test).    FOLLOW UP: Our staff will call the number listed on your records 48-72 hours following your procedure to check on you and address any questions or concerns that you may have regarding the information given to you following your procedure. If we do not reach you, we will leave a message.  We will attempt to reach you two times.  During this call, we will ask if you have developed any symptoms of COVID 19. If you develop any symptoms (ie: fever, flu-like symptoms, shortness of breath, cough etc.) before then, please call [phone].  If you test positive for Covid 19 in the 2 weeks post procedure, please call and report this information to [place].    If any biopsies were taken you will be contacted by phone or by letter within the next 1-3 weeks.  Please call us at [phone] if you have not heard about the biopsies in 3 weeks.    SIGNATURES/CONFIDENTIALITY: You and/or your care partner have signed paperwork which will be entered into your electronic medical record.  These signatures attest to the fact that that the information above on your After Visit Summary has been reviewed and  is understood.  Full responsibility of the confidentiality of this discharge information lies with you and/or your care-partner.

## 2019-07-08 NOTE — Op Note (Signed)
[facility] [name] Procedure Date: 07/08/2019 3:04 PM MRN: [id number] Endoscopist: Jerene Bears , MD Age: 74 Referring MD:  Date of Birth: 1945/10/14 Gender: Female Account #: [id number] Procedure:                Colonoscopy Indications:              Screening for colorectal malignant neoplasm,                            incidental diarrhea and alternating bowel habits Medicines:                Monitored Anesthesia Care Procedure:                Pre-Anesthesia Assessment:                           - Prior to the procedure, a History and Physical                            was performed, and patient medications and                            allergies were reviewed. The patient's tolerance of                            previous anesthesia was also reviewed. The risks                            and benefits of the procedure and the sedation                            options and risks were discussed with the patient.                            All questions were answered, and informed consent                            was obtained. Prior Anticoagulants: The patient has                            taken no previous anticoagulant or antiplatelet                            agents. ASA Grade Assessment: III - A patient with                            severe systemic disease. After reviewing the risks                            and benefits, the patient was deemed in                            satisfactory condition to undergo the procedure.  After obtaining informed consent, the colonoscope                            was passed under direct vision. Throughout the                            procedure, the patient's blood pressure, pulse, and                            oxygen saturations were monitored continuously. The                            Colonoscope was introduced through the anus and                            advanced to  the cecum, identified by appendiceal                            orifice and ileocecal valve. The colonoscopy was                            performed without difficulty. The patient tolerated                            the procedure well. The quality of the bowel                            preparation was good. The ileocecal valve,                            appendiceal orifice, and rectum were photographed. Scope In: 3:49:47 PM Scope Out: 4:12:54 PM Scope Withdrawal Time: 0 hours 15 minutes 20 seconds  Total Procedure Duration: 0 hours 23 minutes 7 seconds  Findings:                 The perianal exam findings include a perianal                            fungal rash.                           The digital rectal exam was normal.                           Two sessile polyps were found in the ascending                            colon. The polyps were 4 to 7 mm in size. These                            polyps were removed with a cold snare. Resection                            and retrieval were complete.  Three sessile polyps were found in the transverse                            colon. The polyps were 3 to 5 mm in size. These                            polyps were removed with a cold snare. Resection                            and retrieval were complete.                           A 5 mm polyp was found in the descending colon. The                            polyp was sessile. The polyp was removed with a                            cold snare. Resection and retrieval were complete.                           A 4 mm polyp was found in the sigmoid colon. The                            polyp was sessile. The polyp was removed with a                            cold snare. Resection and retrieval were complete.                           Many small and large-mouthed diverticula were found                            in the recto-sigmoid colon, sigmoid colon and                             descending colon. There was narrowing of the colon                            in association with the diverticular opening.                           Biopsies for histology were taken with a cold                            forceps from the right colon and left colon for                            evaluation of microscopic colitis.                           Retroflexion in the rectum was not performed due to  anatomy. Complications:            No immediate complications. Estimated Blood Loss:     Estimated blood loss was minimal. Impression:               - Perianal fungal rash found on perianal exam.                           - Two 4 to 7 mm polyps in the ascending colon,                            removed with a cold snare. Resected and retrieved.                           - Three 3 to 5 mm polyps in the transverse colon,                            removed with a cold snare. Resected and retrieved.                           - One 5 mm polyp in the descending colon, removed                            with a cold snare. Resected and retrieved.                           - One 4 mm polyp in the sigmoid colon, removed with                            a cold snare. Resected and retrieved.                           - Severe diverticulosis in the recto-sigmoid colon,                            in the sigmoid colon and in the descending colon.                            The severe sigmoid diverticulosis is likely the                            cause of the alternating bowel habits.                           - Biopsies were taken with a cold forceps from the                            right colon and left colon for evaluation of                            microscopic colitis. Recommendation:           - Patient has a contact number available for  emergencies. The signs and symptoms of potential                            delayed  complications were discussed with the                            patient. Return to normal activities tomorrow.                            Written discharge instructions were provided to the                            patient.                           - Resume previous diet.                           - Continue present medications.                           - Await pathology results.                           - Would add Benefiber 1 heaping tablespoon daily to                            help bowel movements and to avoid diarrhea and                            constipation.                           - No recommendation at this time regarding repeat                            colonoscopy due to age. Jerene Bears, MD 07/08/2019 4:34:03 PM This report has been signed electronically.

## 2019-07-08 NOTE — Progress Notes (Signed)
Report to PACU, RN, vss, BBS= Clear.  

## 2019-07-08 NOTE — Progress Notes (Deleted)
Pt's states no medical or surgical changes since previsit or office visit.  Vitals DT Temp LC

## 2019-07-08 NOTE — Progress Notes (Signed)
Called to room to assist during endoscopic procedure.  Patient ID and intended procedure confirmed with present staff. Received instructions for my participation in the procedure from the performing physician.  

## 2019-07-08 NOTE — Progress Notes (Signed)
Pt's states no medical or surgical changes since previsit or office visit. Pt pulse was 187 on the monitor and then 96 on pulse oximeter, apical pulse is 102, informed Josh Monday CRNA pt had not been taking her metoprolol, pt states "the doctor never told me why I needed it", pt instructed to call the doctor to discuss but to start taking it due to her rapid heart rate, pt states understanding.   Vitals per DT Temp per LC

## 2019-07-12 ENCOUNTER — Telehealth: Payer: Self-pay

## 2019-07-12 NOTE — Telephone Encounter (Signed)
  Follow up Call-  Call back number 07/08/2019 10/23/2017  Post procedure Call Back phone  # [phone]  Permission to leave phone message Yes Yes  Some recent data might be hidden     Patient questions:  Do you have a fever, pain , or abdominal swelling? No. Pain Score  0 *  Have you tolerated food without any problems? Yes.    Have you been able to return to your normal activities? Yes.    Do you have any questions about your discharge instructions: Diet   No. Medications  No. Follow up visit  No.  Do you have questions or concerns about your Care? No.  Actions: * If pain score is 4 or above: No action needed, pain <4. 1. Have you developed a fever since your procedure? no  2.   Have you had an respiratory symptoms (SOB or cough) since your procedure? no  3.   Have you tested positive for COVID 19 since your procedure no  4.   Have you had any family members/close contacts diagnosed with the COVID 19 since your procedure?  no   If yes to any of these questions please route to Joylene John, RN and Erenest Rasher, RN

## 2019-07-14 ENCOUNTER — Ambulatory Visit (INDEPENDENT_AMBULATORY_CARE_PROVIDER_SITE_OTHER): Payer: Medicare HMO | Admitting: Family Medicine

## 2019-07-14 ENCOUNTER — Other Ambulatory Visit: Payer: Self-pay

## 2019-07-14 ENCOUNTER — Encounter: Payer: Self-pay | Admitting: Family Medicine

## 2019-07-14 VITALS — BP 128/82 | HR 50 | Ht 64.0 in | Wt 174.0 lb

## 2019-07-14 DIAGNOSIS — A310 Pulmonary mycobacterial infection: Secondary | ICD-10-CM | POA: Diagnosis not present

## 2019-07-14 DIAGNOSIS — G8929 Other chronic pain: Secondary | ICD-10-CM | POA: Diagnosis not present

## 2019-07-14 DIAGNOSIS — M7061 Trochanteric bursitis, right hip: Secondary | ICD-10-CM | POA: Insufficient documentation

## 2019-07-14 DIAGNOSIS — E538 Deficiency of other specified B group vitamins: Secondary | ICD-10-CM

## 2019-07-14 DIAGNOSIS — E119 Type 2 diabetes mellitus without complications: Secondary | ICD-10-CM

## 2019-07-14 DIAGNOSIS — M25562 Pain in left knee: Secondary | ICD-10-CM | POA: Diagnosis not present

## 2019-07-14 DIAGNOSIS — E139 Other specified diabetes mellitus without complications: Secondary | ICD-10-CM | POA: Diagnosis not present

## 2019-07-14 HISTORY — DX: Trochanteric bursitis, right hip: M70.61

## 2019-07-14 LAB — POCT GLYCOSYLATED HEMOGLOBIN (HGB A1C): HbA1c, POC (controlled diabetic range): 5.7 % (ref 0.0–7.0)

## 2019-07-14 MED ORDER — METHYLPREDNISOLONE ACETATE 40 MG/ML IJ SUSP
40.0000 mg | Freq: Once | INTRAMUSCULAR | Status: AC
  Administered 2019-07-14: 40 mg via INTRAMUSCULAR

## 2019-07-14 MED ORDER — CYANOCOBALAMIN 1000 MCG/ML IJ SOLN
1000.0000 ug | Freq: Once | INTRAMUSCULAR | Status: AC
  Administered 2019-07-14: 1000 ug via INTRAMUSCULAR

## 2019-07-14 NOTE — Assessment & Plan Note (Signed)
b12 drawn and chronic injection given

## 2019-07-14 NOTE — Assessment & Plan Note (Signed)
PROCEDURE: INJECTION: Patient was given informed consent, signed copy in the chart. Appropriate time out was taken. Area prepped and draped in usual sterile fashion. Ethyl chloride was  used for local anesthesia. A 21 gauge 1 1/2 inch needle was used.. 1 cc of methylprednisolone 40 mg/ml plus 4 cc of 1% lidocaine without epinephrine was injected into the left knee using a(n) anterior medial approach.   The patient tolerated the procedure well. There were no complications. Post procedure instructions were given.

## 2019-07-14 NOTE — Assessment & Plan Note (Signed)
PROCEDURE: INJECTION: Patient was given informed consent, signed copy in the chart. Appropriate time out was taken. Area prepped and draped in usual sterile fashion. Ethyl chloride was  used for local anesthesia. A 21 gauge 3 spinal inch needle was used.. 1 cc of methylprednisolone 40 mg/ml plus 4 cc of 1% lidocaine without epinephrine was injected into the right trochanteric bursa using a(n) perpendicular approach.   The patient tolerated the procedure well. There were no complications. Post procedure instructions were given.

## 2019-07-14 NOTE — Progress Notes (Signed)
    SUBJECTIVE:   CHIEF COMPLAINT / HPI: Left knee and right hip pain  Patient is a long-term clinic patient with known arthritis in the left knee and trochanteric recurrent tightness in the right hip.  She does also have complication of Parkinson's but has had no falls lately.  She denies any symptoms of septic arthritis or infection.  She has had no fevers and is otherwise at baseline  PERTINENT  PMH / PSH:   OBJECTIVE:   BP 128/82   Pulse (!) 50   Ht 5\' 4"  (1.626 m)   Wt 174 lb (78.9 kg)   SpO2 98%   BMI 29.87 kg/m   General: Pleasant, alert, evident Parkinson's MSK: Right hip with likely arthritis given physical exam and inability to bear weight solely on right leg which she says is chronic.  Does have very significant point tenderness over the trochanteric bursa, no skin changes.  Left knee exam shows significant crepitus on flexion extension, no obvious bony deformation and no effusion or warmth consistent with infection.  ASSESSMENT/PLAN:   Diabetes mellitus type 2, diet-controlled (HCC) a1c 5.7  Greater trochanteric bursitis of right hip PROCEDURE: INJECTION: Patient was given informed consent, signed copy in the chart. Appropriate time out was taken. Area prepped and draped in usual sterile fashion. Ethyl chloride was  used for local anesthesia. A 21 gauge 3 spinal inch needle was used.. 1 cc of methylprednisolone 40 mg/ml plus 4 cc of 1% lidocaine without epinephrine was injected into the right trochanteric bursa using a(n) perpendicular approach.   The patient tolerated the procedure well. There were no complications. Post procedure instructions were given.  B12 deficiency b12 drawn and chronic injection given  Left knee pain PROCEDURE: INJECTION: Patient was given informed consent, signed copy in the chart. Appropriate time out was taken. Area prepped and draped in usual sterile fashion. Ethyl chloride was  used for local anesthesia. A 21 gauge 1 1/2 inch needle was  used.. 1 cc of methylprednisolone 40 mg/ml plus 4 cc of 1% lidocaine without epinephrine was injected into the left knee using a(n) anterior medial approach.   The patient tolerated the procedure well. There were no complications. Post procedure instructions were given.     Sherene Sires, [place]

## 2019-07-14 NOTE — Assessment & Plan Note (Signed)
a1c 5.7

## 2019-07-15 LAB — VITAMIN B12: Vitamin B-12: 416 pg/mL (ref 232–1245)

## 2019-07-20 ENCOUNTER — Encounter: Payer: Self-pay | Admitting: Internal Medicine

## 2019-08-02 ENCOUNTER — Ambulatory Visit: Payer: Medicare HMO | Admitting: Cardiology

## 2019-08-02 ENCOUNTER — Other Ambulatory Visit: Payer: Self-pay

## 2019-08-03 MED ORDER — TRAMADOL HCL 50 MG PO TABS: ORAL_TABLET | ORAL | 3 refills | Status: DC

## 2019-08-09 ENCOUNTER — Ambulatory Visit: Payer: Medicare HMO | Admitting: Cardiology

## 2019-08-23 ENCOUNTER — Telehealth: Payer: Self-pay | Admitting: Diagnostic Neuroimaging

## 2019-08-23 ENCOUNTER — Other Ambulatory Visit: Payer: Self-pay | Admitting: *Deleted

## 2019-08-23 NOTE — Telephone Encounter (Signed)
Pt is asking to be called by RN, pt has been shaking terribly and she is asking for a call.

## 2019-08-23 NOTE — Telephone Encounter (Signed)
Called patient and advised her that Dr Leta Baptist stated she needs to be seen in person for a thorough evaluation. He will see her or she may call her PCP.  She stated she'll call her PCP, then the call was ended.

## 2019-08-23 NOTE — Telephone Encounter (Addendum)
Spoke with patient who states she is shaking constantly all over her left side, not sleeping well, has had nausea, throwing up medicines x 3 days. She doesn't think she is ill, but its "her nerves, medicines and parkinson's". Her PCP is only in office on Wed, and patient doesn't think she needs to call PCP.  We discussed her coming in, but she's not sure she can even get dressed and get here. We discussed VV, but she doesn't know how to use my chart, stated her daughter does. But she can't guarantee her daughter can help her. She stated she can't wait until next week to have something done.  I advised will discuss with Dr Leta Baptist and call her back.  Patient verbalized understanding, appreciation.

## 2019-08-23 NOTE — Telephone Encounter (Signed)
Needs in person evaluation. -VRP

## 2019-08-24 MED ORDER — OMEPRAZOLE 40 MG PO CPDR: DELAYED_RELEASE_CAPSULE | ORAL | 3 refills | Status: DC

## 2019-08-28 NOTE — Progress Notes (Deleted)
Cardiology Office Note:    Date:  08/28/2019   ID:  [name], DOB 1945/05/08, MRN [id number]  PCP:  [name], MD  Cardiologist:  Fransico Him, MD    Referring MD: [name], MD   No chief complaint on file.   History of Present Illness:    [name] is a 74 y.o. female with a hx of hypertension, diabetes, hyperlipidemia and Parkinson's disease.   She was seen remotely by Dr. Meda Coffee in the past when she went to the emergency room in August 2015 with chest pain and ruled out for MI.  Apparently she had been having chest pain for over 15 years but it had gotten worse.  Stress test showed no ischemia or scar.  2D echo was normal except for mild grade 2 diastolic dysfunction and she was started on Lasix but stopped it because it made her feel tired.  She was last seen by Dr. Meda Coffee in 2016.  She never followed up after that.  I saw her for the first time a year ago for DOE.  She had been much more sedentary due to worsening of her Parkinson's dz.  She would have SOB with minimal exertion and occasionally  some pressure in her chest.  This occured sporadically and thought it was  related to anxiety.  At that time she said that she had had CP for 30 years and that had not changed but the SOB had gotten worse.  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  She has never had syncope and no fm hx of SCD.  2 week Ziopatch showed PACs.    She is here today for followup and is doing well.  She denies any chest pain or pressure, SOB, DOE, PND, orthopnea, LE edema, dizziness, palpitations or syncope. She is compliant with her meds and is tolerating meds with no SE.    Past Medical History:  Diagnosis Date  . Allergy   . Anxiety   . Arthritis    "knees, back" (06/29/2014)  . ARTHRITIS, BACK 03/28/2009   Qualifier: Diagnosis of  By: Nori Riis MD, [name]     . CARPAL TUNNEL SYNDROME, LEFT 01/05/2008   Qualifier: Diagnosis of  By: Nori Riis MD, [name]    . Cataract   . Chronic bronchitis ([place])    "get it q yr"  . Chronic mid back pain   . Cognitive impairment 12/11/2013  . Complication of anesthesia 07/2008   "hard to get me woke up when I had my knee replaced; they said they had to bring me back"  . Dementia ([place])    "I have some; not dx'd by a dr" (08/06/2017)  . Diabetes mellitus type 2, diet-controlled ([place]) 08/05/2006       . Disorder of bursae and tendons in shoulder region 01/24/2009   Qualifier: Diagnosis of  By: Nori Riis MD, [name]    . Esophageal abnormality 08/12/2010   Barium swallow 12/2011  IMPRESSION: Esophageal dysmotility.  No fixed esophageal narrowing or stricture. However, the barium tablet was transiently delayed at the GE junction.  Postsurgical changes at the GE junction. Gastroesophageal reflux is suspected, but could not be confirmed due to the patient's inability to clear her esophagus in the prone position.    . Esophageal stricture   . GERD (gastroesophageal reflux disease)   . Hiatal hernia   .  History of gout 1970's  . Hyperlipidemia   . Hypertension   . Major depressive disorder, recurrent episode ([place]) 06/11/2006   Qualifier: Diagnosis of  By: Beryle Lathe    . Movement disorder   . Neuromuscular disorder ([place])   . Osteoarthritis of multiple joints 08/04/2006   S/p R TKR Significant OA continues to be a problem in her left knee. Lower back OA Complicated by her parkinsonism    . Parkinson's disease ([place])   . 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 SCOLIOSIS 03/28/2009   Qualifier: Diagnosis of  By: Nori Riis MD, [name]    . Situational depression   . Substance abuse ([place])   . Type II diabetes mellitus ([place])   . Ulcerative esophagitis   . UTI (lower urinary tract infection)     Past Surgical History:  Procedure Laterality Date  . ABDOMINAL HYSTERECTOMY    . CHOLECYSTECTOMY OPEN    . COLON SURGERY    .  COLONOSCOPY  07/08/2019  . DILATION AND CURETTAGE OF UTERUS    . ESOPHAGOGASTRIC FUNDOPLASTY     some type "esoph surgery" per pt  . ESOPHAGOGASTRODUODENOSCOPY (EGD) WITH PROPOFOL N/A 08/05/2017   Procedure: ESOPHAGOGASTRODUODENOSCOPY (EGD) WITH PROPOFOL;  Surgeon: Jerene Bears, MD;  Location: [facility];  Service: Gastroenterology;  Laterality: N/A;  . JOINT REPLACEMENT    . TOTAL KNEE ARTHROPLASTY Right 07/2008  . TUBAL LIGATION      Current Medications: No outpatient medications have been marked as taking for the 08/29/19 encounter (Appointment) with Sueanne Margarita, MD.     Allergies:   Ace inhibitors, Azilect [rasagiline mesylate], and Lisinopril   Social History   Socioeconomic History  . Marital status: Married    Spouse name: [name]  . Number of children: 2  . Years of education: 25  . Highest education level: Not on file  Occupational History  . Occupation: Retired    Fish farm manager: NOT EMPLOYED  Tobacco Use  . Smoking status: Never Smoker  . Smokeless tobacco: Never Used  Substance and Sexual Activity  . Alcohol use: Yes    Comment: Occasional  . Drug use: No  . Sexual activity: Not on file  Other Topics Concern  . Not on file  Social History Narrative   Health Care POA:    Emergency Contact: daughter, [name], [phone] husband, [name] [place]:   Who lives with you: Lives with husband 02/07/19   Any pets: Rabbit, [name]   Diet: Patient has a varied diet but struggles with what to eat with hypertension, diabetes, parkinsons   Exercise: Patient does not have any regular exercise routine.   Seatbelts: Patient reports wearing her seatbelt when in vehicle.   Nancy Fetter Exposure/Protection: Patient reports wearing sun block lotion daily.   Hobbies: Watching game shows, writing poetry, writing in journal, volunteering at day program with son.    Caffeine Use: very little occasional      02/07/19 caregiver 2 hrs 2-3 x weekly   Social  Determinants of Health   Financial Resource Strain:   . Difficulty of Paying Living Expenses:   Food Insecurity:   . Worried About Charity fundraiser in the Last Year:   . Arboriculturist in the Last Year:   Transportation Needs:   . Film/video editor (Medical):   Marland Kitchen Lack of Transportation (Non-Medical):   Physical Activity:   . Days of Exercise per Week:   . Minutes of Exercise per  Session:   Stress:   . Feeling of Stress :   Social Connections:   . Frequency of Communication with Friends and Family:   . Frequency of Social Gatherings with Friends and Family:   . Attends Religious Services:   . Active Member of Clubs or Organizations:   . Attends Archivist Meetings:   Marland Kitchen Marital Status:      Family History: The patient's family history includes Cerebral palsy in her son; Diabetes in her mother; Heart attack in her brother and sister; Heart disease in her brother, father, mother, and sister. There is no history of Colon cancer, Esophageal cancer, Liver cancer, Pancreatic cancer, Stomach cancer, or Rectal cancer.  ROS:   Please see the history of present illness.    ROS  All other systems reviewed and negative.   EKGs/Labs/Other Studies Reviewed:    The following studies were reviewed today: 2D echo, nuclear stress test, cardiac MRI, labs  EKG:  EKG is  ordered today.  The ekg ordered today demonstrates ***  Recent Labs: 09/27/2018: NT-Pro BNP 148 04/20/2019: ALT 6; BUN 21; Creatinine, Ser 1.35; Potassium 3.9; Sodium 143   Recent Lipid Panel    Component Value Date/Time   CHOL 244 (H) 06/07/2014 0939   TRIG 384.0 (H) 06/07/2014 0939   HDL 38.60 (L) 06/07/2014 0939   CHOLHDL 6 06/07/2014 0939   VLDL 76.8 (H) 06/07/2014 0939   LDLCALC 123 (H) 12/11/2013 0440   LDLDIRECT 174 (H) 04/20/2019 1229   LDLDIRECT 179 (H) 02/27/2016 1029    Physical Exam:    VS:  There were no vitals taken for this visit.    Wt Readings from Last 3 Encounters:  07/14/19  174 lb (78.9 kg)  05/31/19 174 lb (78.9 kg)  04/20/19 177 lb 9.6 oz (80.6 kg)     GEN:  Well nourished, well developed in no acute distress HEENT: Normal NECK: No JVD; No carotid bruits LYMPHATICS: No lymphadenopathy CARDIAC: RRR, no murmurs, rubs, gallops RESPIRATORY:  Clear to auscultation without rales, wheezing or rhonchi  ABDOMEN: Soft, non-tender, non-distended MUSCULOSKELETAL:  No edema; No deformity  SKIN: Warm and dry NEUROLOGIC:  Alert and oriented x 3 PSYCHIATRIC:  Normal affect   ASSESSMENT:    1. Dyspnea on exertion   2. Essential hypertension   3. Pure hypercholesterolemia    PLAN:    In order of problems listed above:  1.  DOE  - she has had problems with dyspnea on exertion for some time and this was listed as a problem when Dr. Meda Coffee saw her.   -She had been placed on Lasix in the past but she did not like it because it made her feel tired and she stopped it on her own.   -nuclear stress test 2016 and 2020 showed no ischemia  -it was felt that DOE was due to progression of Parkinsons with increased deconditioning.   -2D echo showed normal LVF with HOCM and confirmed with cardiac MRI with LVOT obstruction but no MR or SAM -BNp was normal -SOB is unchanged and appears stable  2.  Hypertension -BP controlled -continue Toprol XL 75mg  daily  3.  Hyperlipidemia  -followed by PCP -diet controlled   Medication Adjustments/Labs and Tests Ordered: Current medicines are reviewed at length with the patient today.  Concerns regarding medicines are outlined above.  No orders of the defined types were placed in this encounter.  No orders of the defined types were placed in this encounter.   Signed,  Fransico Him, MD  08/28/2019 10:19 PM    [place]

## 2019-08-29 ENCOUNTER — Ambulatory Visit: Payer: Medicare HMO | Admitting: Cardiology

## 2019-09-07 ENCOUNTER — Ambulatory Visit: Payer: Medicare HMO | Admitting: Cardiology

## 2019-09-16 ENCOUNTER — Telehealth: Payer: Self-pay | Admitting: *Deleted

## 2019-09-16 NOTE — Telephone Encounter (Signed)
Received fax requesting an alternative for SUCRALFATE. Fax states that pts plan does not cover this medication. They are requesting to change the medication and give them the strength, directions, quantity and refills.  Placed fax in your box for reference if you need it.Tyqwan Pink Zimmerman Rumple, CMA

## 2019-09-19 ENCOUNTER — Other Ambulatory Visit: Payer: Self-pay

## 2019-09-19 NOTE — Telephone Encounter (Signed)
[facility] requesting a change in medication. Sucralfate is not covered by insurance. Ottis Stain, CMA

## 2019-09-20 MED ORDER — SUCRALFATE 1 GM/10ML PO SUSP: ORAL | 3 refills | Status: DC

## 2019-09-20 NOTE — Telephone Encounter (Signed)
Dear [name] Team Can you call the pharmacy and ask them for an equivalent drug? If they can tell me an EQUIVALENT drug that is on their formulary I will rx it, otherwise she needs to decide if she is willing to pay for this. Please note this drug is NOT a PPI or H2 blocker. THANKS! [name]

## 2019-09-21 ENCOUNTER — Telehealth: Payer: Self-pay

## 2019-09-21 NOTE — Telephone Encounter (Signed)
Contacted pharmacy and they said that the tablets are covered and the cost would be much cheaper.April Zimmerman Rumple, CMA

## 2019-09-21 NOTE — Telephone Encounter (Signed)
Patient calls nurse line requesting abx for UTI. Patient reports strong odor, increased frequency and back pain. Patient reports symptoms have been going on for over a week.   Scheduled patient with Dr. Grandville Silos tomorrow morning.   To PCP and Dr. Denice Paradise, RN

## 2019-09-22 ENCOUNTER — Other Ambulatory Visit: Payer: Self-pay

## 2019-09-22 ENCOUNTER — Encounter: Payer: Self-pay | Admitting: Family Medicine

## 2019-09-22 ENCOUNTER — Ambulatory Visit (INDEPENDENT_AMBULATORY_CARE_PROVIDER_SITE_OTHER): Payer: Medicare HMO | Admitting: Family Medicine

## 2019-09-22 VITALS — BP 128/72 | HR 72 | Ht 64.0 in | Wt 177.0 lb

## 2019-09-22 DIAGNOSIS — R350 Frequency of micturition: Secondary | ICD-10-CM | POA: Insufficient documentation

## 2019-09-22 DIAGNOSIS — R1011 Right upper quadrant pain: Secondary | ICD-10-CM

## 2019-09-22 DIAGNOSIS — G8929 Other chronic pain: Secondary | ICD-10-CM | POA: Diagnosis not present

## 2019-09-22 HISTORY — DX: Other chronic pain: G89.29

## 2019-09-22 LAB — POCT URINALYSIS DIP (CLINITEK)
Bilirubin, UA: NEGATIVE
Blood, UA: NEGATIVE
Glucose, UA: NEGATIVE mg/dL
Ketones, POC UA: NEGATIVE mg/dL
Leukocytes, UA: NEGATIVE
Nitrite, UA: NEGATIVE
POC PROTEIN,UA: NEGATIVE
Spec Grav, UA: >=1.03 — AB (ref 1.010–1.025)
Urobilinogen, UA: 0.2 E.U./dL
pH, UA: 5.5 (ref 5.0–8.0)

## 2019-09-22 MED ORDER — CEPHALEXIN 500 MG PO CAPS: 500.0000 mg | ORAL_CAPSULE | Freq: Two times a day (BID) | ORAL | 0 refills | Status: AC

## 2019-09-22 NOTE — Assessment & Plan Note (Signed)
Chronic abdominal pain.  Right upper quadrant.  Interestingly status post cholecystectomy.  Described as a burning pain.  Has history of hiatal hernia and peptic ulcer disease.  Given this, plan to refer to GI for further evaluation including possible endoscopy.

## 2019-09-22 NOTE — Patient Instructions (Signed)
We are culturing your urine.  We are prescribing antibiotic.  If the culture is negative, you will need to stop the antibiotic.  For your abdominal pain we are referring you to gastroenterology.

## 2019-09-22 NOTE — Assessment & Plan Note (Signed)
Symptoms consistent with UTI.  Recent UTI in February.  Although UA is negative, will prescribe Keflex and culture urine.  If urine culture negative, will discontinue antibiotic.

## 2019-09-22 NOTE — Progress Notes (Signed)
    SUBJECTIVE:   CHIEF COMPLAINT / HPI:   Urinary Frequency Symptoms: Urine Odor, urinary hesitency, and urinary frequency,  ROS: Endorses chills,  Duration 1 week Feels like prior UTI  Has Chronic N/Vomiting daily since being on Parkinson medicine, not changed  RUQ abdominal pain 6 month of abdominal pain,  Location RUQ,  Duration 5 days at a time, intermittant "spells" 5/10 pain,  no trigger, not associated with eating,  describes it as a burning,   H/o of hiatal hernia H/o of peptic ulcer S/p colechystectomy "many years ago"   Depression Elevated PHQ9.  Neg for SI. On Zoloft Follow with outpatient psychiatry  PERTINENT  PMH / PSH: PD  OBJECTIVE:   BP 128/72   Pulse 72   Ht 5\' 4"  (1.626 m)   Wt 177 lb (80.3 kg)   SpO2 95%   BMI 30.38 kg/m   General: no apparent distress,  Abdomen: Right upper quadrant tender to palpation, without rebound or guarding, remainder of abdomen benign, soft CVS: RRR, no MRG Skin: Warm and dry Neuro: general tremulousness throughout the body at rest due to Parkinson's, moves all limbs spontaneously  Urine dipstick shows negative for all components.     ASSESSMENT/PLAN:   Urinary frequency Symptoms consistent with UTI.  Recent UTI in February.  Although UA is negative, will prescribe Keflex and culture urine.  If urine culture negative, will discontinue antibiotic.  Abdominal pain, chronic, right upper quadrant Chronic abdominal pain.  Right upper quadrant.  Interestingly status post cholecystectomy.  Described as a burning pain.  Has history of hiatal hernia and peptic ulcer disease.  Given this, plan to refer to GI for further evaluation including possible endoscopy.     Bonnita Hollow, MD [place]

## 2019-09-22 NOTE — Progress Notes (Signed)
Patient scores elevated PHQ-2 of 4. Paper PHQ-9 given. Provider made aware.   Talbot Grumbling, RN

## 2019-09-26 ENCOUNTER — Other Ambulatory Visit: Payer: Self-pay | Admitting: *Deleted

## 2019-09-26 MED ORDER — METOPROLOL SUCCINATE ER 25 MG PO TB24: 75.0000 mg | ORAL_TABLET | Freq: Every day | ORAL | 0 refills | Status: DC

## 2019-09-27 ENCOUNTER — Other Ambulatory Visit: Payer: Self-pay

## 2019-09-27 ENCOUNTER — Encounter: Payer: Self-pay | Admitting: Cardiology

## 2019-09-27 ENCOUNTER — Ambulatory Visit (HOSPITAL_COMMUNITY): Payer: Medicare HMO | Attending: Cardiology

## 2019-09-27 DIAGNOSIS — I272 Pulmonary hypertension, unspecified: Secondary | ICD-10-CM | POA: Insufficient documentation

## 2019-09-28 ENCOUNTER — Telehealth: Payer: Self-pay | Admitting: *Deleted

## 2019-09-28 ENCOUNTER — Telehealth: Payer: Self-pay

## 2019-09-28 DIAGNOSIS — I272 Pulmonary hypertension, unspecified: Secondary | ICD-10-CM

## 2019-09-28 NOTE — Telephone Encounter (Signed)
Staff message sent to Gae Bon ok to schedule HST. [employer] doesn' require a PA.

## 2019-09-28 NOTE — Telephone Encounter (Signed)
The patient has been notified of the result and verbalized understanding.  All questions (if any) were answered. Antonieta Iba, RN 09/28/2019 3:24 PM  Orders placed.

## 2019-09-28 NOTE — Telephone Encounter (Signed)
-----   Message from Antonieta Iba, RN sent at 09/28/2019  3:25 PM EDT ----- Home sleep study has been ordered.  Thanks!

## 2019-09-28 NOTE — Telephone Encounter (Signed)
-----   Message from Sueanne Margarita, MD sent at 09/27/2019  6:45 PM EDT ----- Echo showed normal heart function with increased stiffness of heart muscle related to age, moderately elevated BP in arteries of the lungs, moderately enlarged LA, mildly leaky MV.  No significant change from prior echo.  Repeat echo in 1 year for pulmonary HTN. Please set up for home sleep study and PFTs with [facility] due to pulmonary HTN.

## 2019-09-30 ENCOUNTER — Ambulatory Visit (INDEPENDENT_AMBULATORY_CARE_PROVIDER_SITE_OTHER): Payer: Medicare HMO | Admitting: Emergency Medicine

## 2019-09-30 ENCOUNTER — Other Ambulatory Visit: Payer: Self-pay

## 2019-09-30 DIAGNOSIS — I272 Pulmonary hypertension, unspecified: Secondary | ICD-10-CM | POA: Diagnosis not present

## 2019-09-30 LAB — PULMONARY FUNCTION TEST
DL/VA % pred: 117 %
DL/VA: 4.82 ml/min/mmHg/L
DLCO cor % pred: 84 %
DLCO cor: 16.16 ml/min/mmHg
DLCO unc % pred: 84 %
DLCO unc: 16.16 ml/min/mmHg
FEF 25-75 Post: 1.22 L/sec
FEF 25-75 Pre: 1.65 L/sec
FEF2575-%Change-Post: -25 %
FEF2575-%Pred-Post: 71 %
FEF2575-%Pred-Pre: 96 %
FEV1-%Change-Post: -4 %
FEV1-%Pred-Post: 86 %
FEV1-%Pred-Pre: 91 %
FEV1-Post: 1.86 L
FEV1-Pre: 1.95 L
FEV1FVC-%Change-Post: 4 %
FEV1FVC-%Pred-Pre: 100 %
FEV6-%Change-Post: -8 %
FEV6-%Pred-Post: 86 %
FEV6-%Pred-Pre: 94 %
FEV6-Post: 2.35 L
FEV6-Pre: 2.57 L
FEV6FVC-%Change-Post: 0 %
FEV6FVC-%Pred-Post: 104 %
FEV6FVC-%Pred-Pre: 104 %
FVC-%Change-Post: -8 %
FVC-%Pred-Post: 82 %
FVC-%Pred-Pre: 90 %
FVC-Post: 2.36 L
FVC-Pre: 2.58 L
Post FEV1/FVC ratio: 79 %
Post FEV6/FVC ratio: 100 %
Pre FEV1/FVC ratio: 76 %
Pre FEV6/FVC Ratio: 100 %

## 2019-09-30 NOTE — Progress Notes (Signed)
[place] pre/post?[facility]  Performed. PLETH attempted but patient has excessive amount of tremor.

## 2019-10-04 NOTE — Telephone Encounter (Signed)
Patient is aware and agreeable to Home Sleep Study through [facility]. Patient is scheduled for 12/23/19 at 10 am to pick up home sleep kit and meet with Respiratory therapist at [facility]. Patient is aware that if this appointment date and time does not work for them they should contact Artis Delay directly at [phone]. Patient is aware that a sleep packet will be sent from [facility] in week. Patient is agreeable to treatment and thankful for call.

## 2019-10-12 NOTE — Telephone Encounter (Signed)
Close this encounter

## 2019-10-28 ENCOUNTER — Ambulatory Visit: Payer: Medicare HMO | Admitting: Nurse Practitioner

## 2019-11-10 ENCOUNTER — Ambulatory Visit: Payer: Medicare HMO | Admitting: Cardiology

## 2019-11-23 ENCOUNTER — Ambulatory Visit: Payer: Medicare HMO | Admitting: Nurse Practitioner

## 2019-12-15 NOTE — Progress Notes (Addendum)
Date:  12/16/2019   ID:  [name], DOB 1946/03/25, MRN [id number]   PCP:  [name], MD  Cardiologist:   NEW Electrophysiologist:  None   Chief Complaint:  DOE  History of Present Illness:    [name] is a 74 y.o. female with a history of hypertension, diabetes, hyperlipidemia and Parkinson's disease.   She was seen remotely by Dr. Meda Coffee in the past when she went to the emergency room in August 2015 with chest pain and ruled out for MI.  Apparently she had been having chest pain for over 15 years but it had gotten worse.  Stress test showed no ischemia or scar.  2D echo was normal except for mild grade 2 diastolic dysfunction and she was started on Lasix but stopped it because it made her feel tired.  She was last seen by Dr. Meda Coffee in 2016.  She never followed up after that.  She was last seen by me in 2020 and at that time she was complaining of  increased SOB.  She had been more sedentary due to worsening of her Parkinson's dz.  She would get SOB with minimal exertion and occasionally would have some pressure in her chest but thought it was related to anxiety. Nuclear stress 09/2018 test showed no ischemia and echo showed normal LVF with mild HOCM and mild pulmonary HTN.  Cardiac MRI showed hyperdynamic LVF with basal anteroseptum measuring 76mm with possible LVOT obstruction but no SAM and possible small area of LGE in epicardium of basal anteroseptum.  She is here today for followup and is doing well.  She has chronic chest pain and SOB that she has had for years with normal stress test in the past and this has not change in severity or frequency.  She denies any  PND, orthopnea,  dizziness, palpitations or syncope. She occasionally has some mild LE edema.  She is compliant with her meds and is tolerating meds with no SE.    Prior CV studies:   The following studies were reviewed today:  Leane Call 09/2018 Study Highlights    The left ventricular  ejection fraction is hyperdynamic (>65%).  Nuclear stress EF: 80%.  There was no ST segment deviation noted during stress.  No T wave inversion was noted during stress.  The study is normal.  This is a low risk study.   Low risk stress nuclear study with normal perfusion and normal left ventricular regional and global systolic function.  2D echo 09/2019 IMPRESSIONS   1. EF 60-65%, Grade 2 DD, increased LAP, mild to moderately elevated RVSP  (49 mmHG). No change from prior.  2. Left ventricular ejection fraction, by estimation, is 60 to 65%. The  left ventricle has normal function. The left ventricle has no regional  wall motion abnormalities. Left ventricular diastolic parameters are  consistent with Grade II diastolic  dysfunction (pseudonormalization). Elevated left atrial pressure.  3. Right ventricular systolic function is normal. The right ventricular  size is normal. There is mildly elevated pulmonary artery systolic  pressure. The estimated right ventricular systolic pressure is 80.1 mmHg.  4. Left atrial size was mild to moderately dilated.  5. The mitral valve is grossly normal. Mild mitral valve regurgitation.  No evidence of mitral stenosis.  6. The aortic valve is grossly normal. Aortic valve regurgitation is not  visualized. No aortic stenosis is present.  7. The inferior vena cava is normal in size with greater than 50%  respiratory variability,  suggesting right atrial pressure of 3 mmHg.   Cardiac MRI 10/2018 IMPRESSION: Image quality significant impacted by motion artifact. Suboptimal quality study for diagnosis and risk stratification of hypertrophic cardiomyopathy.  1. Hyperdynamic LV function with visually estimated EF 65-70%, with asymmetric increased wall thickness in the basal anteroseptum measuring 15 mm in maximal thickness. Flow dephasing suggests left ventricular outflow tract obstruction. No systolic anterior motion of the mitral valve.  Mitral regurgitation was not well assessed due to motion artifact.  2. Possible small area of epicardial delayed myocardial enhancement in the basal anteroseptum, as well as the inferior RV insertion point.  3. No evidence of cardiac amyloidosis based on normal T1 myocardial nulling kinetics.  4. Moderately dilated main pulmonary artery measured in a transverse plane, 36 mm.    Past Medical History:  Diagnosis Date  . Allergy   . Anxiety   . Arthritis    "knees, back" (06/29/2014)  . ARTHRITIS, BACK 03/28/2009   Qualifier: Diagnosis of  By: Nori Riis MD, [name]    . CARPAL TUNNEL SYNDROME, LEFT 01/05/2008   Qualifier: Diagnosis of  By: Nori Riis MD, [name]    . Cataract   . Chronic bronchitis ([place])    "get it q yr"  . Chronic mid back pain   . Cognitive impairment 12/11/2013  . Complication of anesthesia 07/2008   "hard to get me woke up when I had my knee replaced; they said they had to bring me back"  . Dementia ([place])    "I have some; not dx'd by a dr" (08/06/2017)  . Diabetes mellitus type 2, diet-controlled ([place]) 08/05/2006       . Disorder of bursae and tendons in shoulder region 01/24/2009   Qualifier: Diagnosis of  By: Nori Riis MD, [name]    . Esophageal abnormality 08/12/2010   Barium swallow 12/2011  IMPRESSION: Esophageal dysmotility.  No fixed esophageal narrowing or stricture. However, the barium tablet was transiently delayed at the GE junction.  Postsurgical changes at the GE junction. Gastroesophageal reflux is suspected, but could not be confirmed due to the patient's inability to clear her esophagus in the prone position.    . Esophageal stricture   . GERD (gastroesophageal reflux disease)   . Hiatal hernia   . History of gout 1970's  . Hyperlipidemia   . Hypertension   . Major depressive disorder, recurrent episode ([place]) 06/11/2006   Qualifier: Diagnosis of  By: Beryle Lathe    . Movement disorder   . Neuromuscular disorder ([place])   . Osteoarthritis of multiple joints  08/04/2006   S/p R TKR Significant OA continues to be a problem in her left knee. Lower back OA Complicated by her parkinsonism    . Parkinson's disease ([place])   . Pulmonary HTN ([place])    moderate with PASP 83mmHg on echo 09/2019  . 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 SCOLIOSIS 03/28/2009   Qualifier: Diagnosis of  By: Nori Riis MD, [name]    . Situational depression   . Substance abuse ([place])   . Type II diabetes mellitus ([place])   . Ulcerative esophagitis   . UTI (lower urinary tract infection)    Past Surgical History:  Procedure Laterality Date  . ABDOMINAL HYSTERECTOMY    . CHOLECYSTECTOMY OPEN    . COLON SURGERY    . COLONOSCOPY  07/08/2019  . DILATION AND CURETTAGE OF UTERUS    . ESOPHAGOGASTRIC FUNDOPLASTY     some type "esoph surgery" per pt  . ESOPHAGOGASTRODUODENOSCOPY (EGD) WITH  PROPOFOL N/A 08/05/2017   Procedure: ESOPHAGOGASTRODUODENOSCOPY (EGD) WITH PROPOFOL;  Surgeon: Jerene Bears, MD;  Location: [facility] ENDOSCOPY;  Service: Gastroenterology;  Laterality: N/A;  . JOINT REPLACEMENT    . TOTAL KNEE ARTHROPLASTY Right 07/2008  . TUBAL LIGATION       Current Meds  Medication Sig  . carbidopa-levodopa (SINEMET IR) 25-100 MG tablet Take 1 tablet by mouth 5 (five) times daily.  . furosemide (LASIX) 20 MG tablet Take 1 tablet (20 mg total) by mouth as needed for edema (Swelling).  . gabapentin (NEURONTIN) 300 MG capsule Take 300-600 mg by mouth See admin instructions. Take  2 capsules in the morning and 2 capsules at lunch 3 capsule in the evening as needed  . metoprolol succinate (TOPROL-XL) 25 MG 24 hr tablet Take 3 tablets (75 mg total) by mouth daily.  Marland Kitchen nystatin-triamcinolone ointment (MYCOLOG) Apply 1 application topically 3 (three) times daily.  Marland Kitchen omeprazole (PRILOSEC) 40 MG capsule TAKE 1 CAPSULE BY MOUTH TWICE DAILY  . sertraline (ZOLOFT) 100 MG tablet Take 100 mg by mouth daily. Per psych  . traMADol (ULTRAM) 50 MG tablet TAKE 1 TO 2 TABLETS BY MOUTH TWICE DAILY  AS NEEDED FOR CHRONIC KNEE PAIN     Allergies:   Ace inhibitors, Azilect [rasagiline mesylate], and Lisinopril   Social History   Tobacco Use  . Smoking status: Never Smoker  . Smokeless tobacco: Never Used  Vaping Use  . Vaping Use: Never used  Substance Use Topics  . Alcohol use: Yes    Comment: Occasional  . Drug use: No     Family Hx: The patient's family history includes Cerebral palsy in her son; Diabetes in her mother; Heart attack in her brother and sister; Heart disease in her brother, father, mother, and sister. There is no history of Colon cancer, Esophageal cancer, Liver cancer, Pancreatic cancer, Stomach cancer, or Rectal cancer.  ROS:   Please see the history of present illness.     All other systems reviewed and are negative.   Labs/Other Tests and Data Reviewed:    Recent Labs: 04/20/2019: ALT 6; BUN 21; Creatinine, Ser 1.35; Potassium 3.9; Sodium 143   Recent Lipid Panel Lab Results  Component Value Date/Time   CHOL 244 (H) 06/07/2014 09:39 AM   TRIG 384.0 (H) 06/07/2014 09:39 AM   HDL 38.60 (L) 06/07/2014 09:39 AM   CHOLHDL 6 06/07/2014 09:39 AM   LDLCALC 123 (H) 12/11/2013 04:40 AM   LDLDIRECT 174 (H) 04/20/2019 12:29 PM   LDLDIRECT 179 (H) 02/27/2016 10:29 AM    Wt Readings from Last 3 Encounters:  12/16/19 179 lb 9.6 oz (81.5 kg)  09/22/19 177 lb (80.3 kg)  07/14/19 174 lb (78.9 kg)     Objective:    Vital Signs:  BP 126/80   Pulse 62   Ht 5\' 4"  (1.626 m)   Wt 179 lb 9.6 oz (81.5 kg)   SpO2 96%   BMI 30.83 kg/m    GEN: Well nourished, well developed in no acute distress HEENT: Normal NECK: No JVD; No carotid bruits LYMPHATICS: No lymphadenopathy CARDIAC:RRR, no murmurs, rubs, gallops RESPIRATORY:  Clear to auscultation without rales, wheezing or rhonchi  ABDOMEN: Soft, non-tender, non-distended MUSCULOSKELETAL:  No edema; No deformity  SKIN: Warm and dry NEUROLOGIC:  Alert and oriented x 3 PSYCHIATRIC:  Normal affect   EKG was  performed in office today and demonstrates NSR with no ST changes and wandering baseline artifact from her Parkinson's tremor  ASSESSMENT &  PLAN:    1.  DOE/PAH - she has had problems with dyspnea on exertion for years and this was listed as a problem when Dr. Meda Coffee saw her.   -Her echo in the past showed normal LV function with grade 2 diastolic dysfunction.  -she was placed on Lasix in the past but she did not like it because it made her feel tired and she stopped it on her own.   -Nuclear stress test in 2016 and 2020 showed no ischemia.   -2D echo 09/2019 showed normal LVF and G2DD with moderate pulmonary HTN with PASP 13mmHg and no change from prior echo -PFTs were good and she has a home sleep study pending in a few days for workup of PHTN -repeat echo 1 year to [facility] -SOB felt to be due to a combination of diastolic CHF as well as progression of Parkinsons with increased deconditioning.  BNP was normal a year ago  2.  Hypertension  -BP is controlled on exam today -continue Toprol XL 75mg  daily  3.  Hyperlipidemia  -followup by her PCP  4.  Pulmonary HTN -moderate by echo 09/2019 -repeat in 1 year  5.  Possible HOCM -Cardiac MRI was suboptimal for diagnosis but patient has moderate ASSH with LVOT outflow gradient but no SAM - ? LGE basal anteroseptum -no hx of syncope and no fm hx of SCD -no ventricular arrhythmias a year ago on monitor -continue BB  6.  Nonsustained atrial tachycardia -noted on event monitor a year ago -denies any palpitations -continue BB    Medication Adjustments/Labs and Tests Ordered: Current medicines are reviewed at length with the patient today.  Concerns regarding medicines are outlined above.  Tests Ordered: Orders Placed This Encounter  Procedures  . EKG 12-Lead   Medication Changes: No orders of the defined types were placed in this encounter.   Disposition:  Follow up prn  Signed, Fransico Him, MD  12/16/2019 2:46 PM    [place]

## 2019-12-16 ENCOUNTER — Other Ambulatory Visit: Payer: Self-pay

## 2019-12-16 ENCOUNTER — Encounter: Payer: Self-pay | Admitting: Cardiology

## 2019-12-16 ENCOUNTER — Ambulatory Visit: Payer: Medicare HMO | Admitting: Cardiology

## 2019-12-16 VITALS — BP 126/80 | HR 62 | Ht 64.0 in | Wt 179.6 lb

## 2019-12-16 DIAGNOSIS — R0600 Dyspnea, unspecified: Secondary | ICD-10-CM

## 2019-12-16 DIAGNOSIS — R0609 Other forms of dyspnea: Secondary | ICD-10-CM

## 2019-12-16 DIAGNOSIS — I471 Supraventricular tachycardia: Secondary | ICD-10-CM

## 2019-12-16 DIAGNOSIS — I4719 Other supraventricular tachycardia: Secondary | ICD-10-CM

## 2019-12-16 DIAGNOSIS — I10 Essential (primary) hypertension: Secondary | ICD-10-CM

## 2019-12-16 DIAGNOSIS — I272 Pulmonary hypertension, unspecified: Secondary | ICD-10-CM | POA: Diagnosis not present

## 2019-12-16 DIAGNOSIS — I421 Obstructive hypertrophic cardiomyopathy: Secondary | ICD-10-CM

## 2019-12-16 DIAGNOSIS — E7800 Pure hypercholesterolemia, unspecified: Secondary | ICD-10-CM

## 2019-12-16 NOTE — Patient Instructions (Addendum)

## 2019-12-23 ENCOUNTER — Ambulatory Visit (HOSPITAL_BASED_OUTPATIENT_CLINIC_OR_DEPARTMENT_OTHER): Payer: Medicare HMO | Attending: Cardiology | Admitting: Cardiology

## 2020-01-04 ENCOUNTER — Ambulatory Visit: Payer: Medicare HMO | Admitting: Family Medicine

## 2020-01-09 ENCOUNTER — Telehealth: Payer: Self-pay

## 2020-01-09 ENCOUNTER — Other Ambulatory Visit: Payer: Self-pay

## 2020-01-09 MED ORDER — METOPROLOL SUCCINATE ER 50 MG PO TB24: 75.0000 mg | ORAL_TABLET | Freq: Every day | ORAL | 0 refills | Status: DC

## 2020-01-09 NOTE — Telephone Encounter (Signed)
The pt is advised that her Metoprolol mg and instructions have changed due to ins coverage and that now her tablet is 50 mg with instructions to take 1 and 1/2 tablets (75 mg) daily and that when she finishes the bottle that she currently has that her next Metoprolol refill will be 50 mg (take 1 and 1/2 tablets daily) and NOT 25mg  (Take 3 tablet daily).  She verbalized understanding and thanked me for calling her to let her know.

## 2020-01-09 NOTE — Telephone Encounter (Signed)
[name] is returning [name]'s call. Please advise.

## 2020-01-09 NOTE — Telephone Encounter (Signed)
We received a Metoprolol 25 mg PA request with instructions to take 3 tablets (75 mg) daily for # 270. I changed RX to Metoprolol 50 mg with instruction to take 1 and 1/2 tablets daily # 135 and then called [facility] and was advised that a PA is no longer needed and that it will cost the pt $3.70.  I called the pt to make her aware that she will only take 1 and 1/2 tablets of her Metoprolol 50 mg and not 3 but a man answered (she has no DPR on file with [place]) and said that the pt is sleeping. I left a message with him asking the pt to call Jeani Hawking back at Dr Landis Gandy office at [facility] at [phone]. He states that he will give the pt my message.

## 2020-01-17 ENCOUNTER — Ambulatory Visit (INDEPENDENT_AMBULATORY_CARE_PROVIDER_SITE_OTHER): Payer: Medicare Other

## 2020-01-17 ENCOUNTER — Other Ambulatory Visit: Payer: Self-pay

## 2020-01-17 DIAGNOSIS — Z23 Encounter for immunization: Secondary | ICD-10-CM | POA: Diagnosis not present

## 2020-01-17 NOTE — Progress Notes (Signed)
   Covid-19 Vaccination Clinic  Name:  Lalania Haseman    MRN: [id number] DOB: 10-03-1945  01/17/2020  Ms. Scheerer was observed post Covid-19 immunization for 15 minutes without incident. She was provided with Vaccine Information Sheet and instruction to access the V-Safe system.   Ms. Billick was instructed to call 911 with any severe reactions post vaccine: Marland Kitchen Difficulty breathing  . Swelling of face and throat  . A fast heartbeat  . A bad rash all over body  . Dizziness and weakness   Covid Booster administered RD without complication.

## 2020-02-07 ENCOUNTER — Ambulatory Visit: Payer: Medicare HMO

## 2020-02-14 ENCOUNTER — Other Ambulatory Visit: Payer: Self-pay

## 2020-02-14 ENCOUNTER — Ambulatory Visit (INDEPENDENT_AMBULATORY_CARE_PROVIDER_SITE_OTHER): Payer: Medicare HMO

## 2020-02-14 DIAGNOSIS — Z23 Encounter for immunization: Secondary | ICD-10-CM | POA: Diagnosis not present

## 2020-02-14 MED ORDER — TRAMADOL HCL 50 MG PO TABS: ORAL_TABLET | ORAL | 3 refills | Status: DC

## 2020-02-14 NOTE — Progress Notes (Signed)
Patient presents in flu clinic for flu vaccine.  Vaccine administered LD without complication.   See admin for details.

## 2020-03-20 ENCOUNTER — Telehealth: Payer: Self-pay | Admitting: *Deleted

## 2020-03-20 MED ORDER — FLUTICASONE PROPIONATE 50 MCG/ACT NA SUSP: 2.0000 | Freq: Every day | NASAL | 12 refills | Status: DC

## 2020-03-20 NOTE — Telephone Encounter (Signed)
Received fax from pharmacy ([facility]/[address])  requesting a refill on fluticasone but did not see on pts current med list. Tangelia Sanson Katharina Caper, CMA

## 2020-04-26 ENCOUNTER — Telehealth: Payer: Self-pay

## 2020-04-26 NOTE — Telephone Encounter (Signed)
Received VM from patient's husband requesting returned phone call from nurse. Attempted to call patient this AM, however, husband answers and patient is still sleeping. Husband will have patient return call later this morning/afternoon.   Talbot Grumbling, RN

## 2020-05-10 ENCOUNTER — Telehealth: Payer: Self-pay

## 2020-05-10 NOTE — Telephone Encounter (Signed)
Patient calls nurse line regarding intermittent nausea for approx. 1 week. Reports that she has been taking pepto bismol, with minimal relief. States that she started vomiting yesterday. States that she is currently able to tolerate liquids. Patient is requesting rx for zofran. Per patient, this has worked well for her in the past.   Will forward to PCP. Encouraged to push fluids as tolerated and provided with strict ED precautions.   Talbot Grumbling, RN

## 2020-05-11 MED ORDER — ONDANSETRON HCL 4 MG PO TABS: 4.0000 mg | ORAL_TABLET | Freq: Three times a day (TID) | ORAL | 0 refills | Status: DC | PRN

## 2020-05-11 NOTE — Telephone Encounter (Signed)
Ok will send in. Apprecaite you giving her precautions for appt or ED. [name]

## 2020-05-15 NOTE — Progress Notes (Signed)
SUBJECTIVE:   CHIEF COMPLAINT / HPI:   Dysphagia: Patient 75 year old female who presents today to discuss dysphagia symptoms.  Per chart review the patient did have a normal swallow study in the past several years ago.  In 2015 the patient had an adequate oropharyngeal swallow function, normal timing, strength, airway protection, however her missing dentition was noted to make it difficult for the patient fully masticate tough tissues.  There was the expectation that solid boluses are likely not transmitted well through the esophagus especially if they were not thoroughly masticated and recommended the patient cut up foods finely to avoid any potential adverse effects. She states she previously followed with GI before as well and had esophageal dilation. She's noticing difficulty swallowing with all foods. Feels like foods gets stuck in her throat. She also feels like water "gets stuck" in her throat as well.   Chronic knee pain Patient also request a knee injection due to chronic osteoarthritis of her left knee.  Patient has had several steroid injections into her left knee to buy her time for a total knee replacement which she needs per the documentation in her chart but does not want to do at this point.  Most recent knee injection was 07/14/2019. She states the last knee injection did well for several months.  PERTINENT  PMH / PSH: History of Parkinson's disease  OBJECTIVE:   BP (!) 149/85   Pulse (!) 55   Ht 5' 2.5" (1.588 m)   Wt 174 lb 6.4 oz (79.1 kg)   SpO2 97%   BMI 31.39 kg/m    General: NAD, pleasant, able to participate in exam Respiratory: No respiratory distress MSK: Knee, Left: Inspection was negative for erythema, ecchymosis, and effusion. No obvious bony abnormalities or signs of osteophyte development. Palpation yielded no asymmetric warmth; No joint line tenderness Neuro: alert, no obvious focal deficits Psych: Normal affect and mood  Procedure note-left knee  injection: Written and verbal consent was obtained after discussing the risks and benefits of the procedure with the patient. A timeout was performed and the correct site and side was identified and confirmed.  The left knee was cleaned in sterile fashion with betadine and alcohol pad. Ethyl chloride was used for local anesthesia. 1 cc of methylprednisolone 40 mg/ml plus 4 cc of 1% lidocaine without epinephrine was injected using a anterior medial approach using a 5 cc syringe and 25 gauge 1 and 1/2 in needle. No complications were encountered. Minimal blood loss. A band aid was applied.    ASSESSMENT/PLAN:   Dysphagia Assessment: 75 year old female with a history of Parkinson's disease presenting with worsening dysphagia.  She has previously been evaluated with swallow study several years ago which was normal but recommended soft foods due to poor dentition.  Per patient she did have a history of esophageal dilation in the past but notes over the past months that she has had worsening dysphagia with both solids and liquids. She states that she feels like they get stuck in her throat.. Plan: -We will refer for repeat swallow study as patient's swallow function may have worsened since last evaluation. -In the meantime recommend cutting foods into small pieces and avoiding foods which require significant mastication.  Recommend soft diet until further evaluated. -After discussing with Dr. Owens Shark will also refer for gastroenterology as patient states she has had a history of a esophageal dilation  Left knee pain Assessment/plan: 75 year old female with chronic osteoarthritis of the left knee.  She has been  recommended in the past for total knee replacement but has been trying to buy time for this to happen by having somewhat regular cortisone injections.  Most recent injection about a year ago.  No red flag symptoms.  Knee injection performed today as above.  See procedure note above.   Lurline Del,  [place]    This note was prepared using Dragon voice recognition software and may include unintentional dictation errors due to the inherent limitations of voice recognition software.

## 2020-05-15 NOTE — Patient Instructions (Signed)
It was great to see you! Thank you for allowing me to participate in your care!  Our plans for today:  -I have sent in referral for a swallow study to evaluate for your swallowing ability.  They should contact you in the next 1-2 weeks.  If they do not please let me know. -Today we also performed your knee injection.  You may experience a little bit of worsening pain over the next 24 hours, this is normal.  If you develop any significant pain, swelling, redness of the joint please let us know immediately.  Take care and seek immediate care sooner if you develop any concerns.   Dr. Lurline Del, [place]

## 2020-05-17 ENCOUNTER — Other Ambulatory Visit: Payer: Self-pay

## 2020-05-17 ENCOUNTER — Encounter: Payer: Self-pay | Admitting: Family Medicine

## 2020-05-17 ENCOUNTER — Ambulatory Visit: Payer: Medicare PPO | Admitting: Family Medicine

## 2020-05-17 VITALS — BP 149/85 | HR 55 | Ht 62.5 in | Wt 174.4 lb

## 2020-05-17 DIAGNOSIS — G8929 Other chronic pain: Secondary | ICD-10-CM | POA: Diagnosis not present

## 2020-05-17 DIAGNOSIS — F3341 Major depressive disorder, recurrent, in partial remission: Secondary | ICD-10-CM | POA: Diagnosis not present

## 2020-05-17 DIAGNOSIS — M25562 Pain in left knee: Secondary | ICD-10-CM | POA: Diagnosis not present

## 2020-05-17 DIAGNOSIS — R1310 Dysphagia, unspecified: Secondary | ICD-10-CM

## 2020-05-17 MED ORDER — METHYLPREDNISOLONE ACETATE 40 MG/ML IJ SUSP
40.0000 mg | Freq: Once | INTRAMUSCULAR | Status: AC
  Administered 2020-05-17: 40 mg via INTRAMUSCULAR

## 2020-05-17 NOTE — Assessment & Plan Note (Signed)
Assessment/plan: 75 year old female with chronic osteoarthritis of the left knee.  She has been recommended in the past for total knee replacement but has been trying to buy time for this to happen by having somewhat regular cortisone injections.  Most recent injection about a year ago.  No red flag symptoms.  Knee injection performed today as above.  See procedure note above.

## 2020-05-17 NOTE — Assessment & Plan Note (Signed)
Assessment: 76 year old female with a history of Parkinson's disease presenting with worsening dysphagia.  She has previously been evaluated with swallow study several years ago which was normal but recommended soft foods due to poor dentition.  Per patient she did have a history of esophageal dilation in the past but notes over the past months that she has had worsening dysphagia with both solids and liquids. She states that she feels like they get stuck in her throat.. Plan: -We will refer for repeat swallow study as patient's swallow function may have worsened since last evaluation. -In the meantime recommend cutting foods into small pieces and avoiding foods which require significant mastication.  Recommend soft diet until further evaluated. -After discussing with Dr. Owens Shark will also refer for gastroenterology as patient states she has had a history of a esophageal dilation

## 2020-06-13 ENCOUNTER — Ambulatory Visit: Payer: Medicare PPO | Admitting: Physician Assistant

## 2020-06-20 ENCOUNTER — Ambulatory Visit: Payer: Medicare PPO | Admitting: Gastroenterology

## 2020-06-21 ENCOUNTER — Telehealth: Payer: Self-pay

## 2020-06-21 NOTE — Telephone Encounter (Signed)
Patient calls nurse line with concerns for SOB and chest pain. Patient reports symptoms started last night, and last night was "bad." Patient reports she is scared and wants to be seen by a doctor. Patient was having a hard time speaking in full sentences, however she reports this is normal for her. Patient is home with her husband. Advised due to time and symptoms to be evaluated by ED and to call 911 if appropriate for her. Patient agrees with plan.

## 2020-06-22 ENCOUNTER — Telehealth: Payer: Self-pay | Admitting: Diagnostic Neuroimaging

## 2020-06-22 NOTE — Telephone Encounter (Signed)
Pt's husband, Paris Hohn (Pt do not have a DPR) called, can you tell me when she was diagnosed with Parkinson's. I Deneise Lever) informed Mr Deerman can not release medical information because there is not a Environmental consultant for her. Mr. Brandstetter stated, I can wake her up for you to speak with her. Spoke with the patient. Informed her I am not in the clinical part of the office. Will send nurse a message to call you on Monday.

## 2020-06-25 NOTE — Telephone Encounter (Signed)
Called patient who asked when she was diagnosed with Parkinson's. I advised it appears from notes it was in 2012, but exact date not available. Medical records may be able to get a copy of those notes. She stated she would call back and if her husband called, she stated I can talk to him.

## 2020-06-25 NOTE — Telephone Encounter (Signed)
Called patient, husband answered phone, stated she  is asleep, requested I call back after 3 pm. DPR is on file but not for all [facility].  She was last seen 01/2019, no FU scheduled, was to FU here in a year.  Will call back.

## 2020-07-09 ENCOUNTER — Telehealth: Payer: Self-pay

## 2020-07-09 NOTE — Telephone Encounter (Signed)
Patient calls nurse line regarding vomiting and dizziness that has been going on for approx 3 days. Patient also reports that she has been having difficulty walking due to Parkinson's. Recommended that patient be evaluated due to possible dehydration. Patient reports that she cannot come into the office and she is not taking an ambulance and going to the hospital or UC. Attempted to give additional recommendations, however, patient became upset, and said she would treat at home. Phone was then disconnected.   Please advise   Talbot Grumbling, RN

## 2020-07-10 ENCOUNTER — Observation Stay (HOSPITAL_COMMUNITY)
Admission: AD | Admit: 2020-07-10 | Discharge: 2020-07-13 | Disposition: A | Discharge status: Home / Self Care | Payer: Medicare PPO | Source: Ambulatory Visit | Attending: Family Medicine | Admitting: Family Medicine

## 2020-07-10 ENCOUNTER — Other Ambulatory Visit: Payer: Self-pay

## 2020-07-10 ENCOUNTER — Ambulatory Visit: Payer: Medicare PPO | Admitting: Family Medicine

## 2020-07-10 ENCOUNTER — Encounter: Payer: Self-pay | Admitting: Family Medicine

## 2020-07-10 ENCOUNTER — Observation Stay (HOSPITAL_COMMUNITY): Payer: Medicare PPO

## 2020-07-10 ENCOUNTER — Ambulatory Visit (HOSPITAL_COMMUNITY)
Admission: AD | Admit: 2020-07-10 | Discharge: 2020-07-10 | Disposition: A | Discharge status: Home / Self Care | Payer: Medicare PPO | Source: Ambulatory Visit | Admitting: Family Medicine

## 2020-07-10 VITALS — BP 128/82 | HR 112 | Wt 167.0 lb

## 2020-07-10 DIAGNOSIS — G20 Parkinson's disease: Secondary | ICD-10-CM | POA: Diagnosis not present

## 2020-07-10 DIAGNOSIS — M8949 Other hypertrophic osteoarthropathy, multiple sites: Secondary | ICD-10-CM

## 2020-07-10 DIAGNOSIS — K2100 Gastro-esophageal reflux disease with esophagitis, without bleeding: Secondary | ICD-10-CM | POA: Diagnosis not present

## 2020-07-10 DIAGNOSIS — R300 Dysuria: Secondary | ICD-10-CM

## 2020-07-10 DIAGNOSIS — K3189 Other diseases of stomach and duodenum: Secondary | ICD-10-CM | POA: Diagnosis not present

## 2020-07-10 DIAGNOSIS — Z8639 Personal history of other endocrine, nutritional and metabolic disease: Secondary | ICD-10-CM | POA: Diagnosis not present

## 2020-07-10 DIAGNOSIS — K222 Esophageal obstruction: Secondary | ICD-10-CM | POA: Insufficient documentation

## 2020-07-10 DIAGNOSIS — Z1159 Encounter for screening for other viral diseases: Secondary | ICD-10-CM

## 2020-07-10 DIAGNOSIS — R2681 Unsteadiness on feet: Secondary | ICD-10-CM | POA: Insufficient documentation

## 2020-07-10 DIAGNOSIS — K229 Disease of esophagus, unspecified: Secondary | ICD-10-CM

## 2020-07-10 DIAGNOSIS — E7800 Pure hypercholesterolemia, unspecified: Secondary | ICD-10-CM | POA: Diagnosis not present

## 2020-07-10 DIAGNOSIS — I5033 Acute on chronic diastolic (congestive) heart failure: Secondary | ICD-10-CM | POA: Insufficient documentation

## 2020-07-10 DIAGNOSIS — E119 Type 2 diabetes mellitus without complications: Secondary | ICD-10-CM

## 2020-07-10 DIAGNOSIS — M159 Polyosteoarthritis, unspecified: Secondary | ICD-10-CM

## 2020-07-10 DIAGNOSIS — R2689 Other abnormalities of gait and mobility: Secondary | ICD-10-CM | POA: Insufficient documentation

## 2020-07-10 DIAGNOSIS — E876 Hypokalemia: Secondary | ICD-10-CM | POA: Diagnosis not present

## 2020-07-10 DIAGNOSIS — K449 Diaphragmatic hernia without obstruction or gangrene: Secondary | ICD-10-CM | POA: Insufficient documentation

## 2020-07-10 DIAGNOSIS — Z79899 Other long term (current) drug therapy: Secondary | ICD-10-CM | POA: Diagnosis not present

## 2020-07-10 DIAGNOSIS — E1122 Type 2 diabetes mellitus with diabetic chronic kidney disease: Secondary | ICD-10-CM | POA: Insufficient documentation

## 2020-07-10 DIAGNOSIS — K921 Melena: Secondary | ICD-10-CM

## 2020-07-10 DIAGNOSIS — I4891 Unspecified atrial fibrillation: Secondary | ICD-10-CM | POA: Diagnosis not present

## 2020-07-10 DIAGNOSIS — N183 Chronic kidney disease, stage 3 unspecified: Secondary | ICD-10-CM | POA: Diagnosis not present

## 2020-07-10 DIAGNOSIS — I130 Hypertensive heart and chronic kidney disease with heart failure and stage 1 through stage 4 chronic kidney disease, or unspecified chronic kidney disease: Secondary | ICD-10-CM | POA: Insufficient documentation

## 2020-07-10 DIAGNOSIS — Z20822 Contact with and (suspected) exposure to covid-19: Secondary | ICD-10-CM | POA: Diagnosis not present

## 2020-07-10 DIAGNOSIS — I499 Cardiac arrhythmia, unspecified: Secondary | ICD-10-CM | POA: Diagnosis not present

## 2020-07-10 DIAGNOSIS — R519 Headache, unspecified: Secondary | ICD-10-CM | POA: Diagnosis not present

## 2020-07-10 DIAGNOSIS — F419 Anxiety disorder, unspecified: Secondary | ICD-10-CM | POA: Insufficient documentation

## 2020-07-10 DIAGNOSIS — R1310 Dysphagia, unspecified: Principal | ICD-10-CM | POA: Insufficient documentation

## 2020-07-10 DIAGNOSIS — R112 Nausea with vomiting, unspecified: Secondary | ICD-10-CM

## 2020-07-10 DIAGNOSIS — F0280 Dementia in other diseases classified elsewhere without behavioral disturbance: Secondary | ICD-10-CM | POA: Diagnosis present

## 2020-07-10 DIAGNOSIS — R296 Repeated falls: Secondary | ICD-10-CM

## 2020-07-10 DIAGNOSIS — I272 Pulmonary hypertension, unspecified: Secondary | ICD-10-CM | POA: Diagnosis present

## 2020-07-10 DIAGNOSIS — R079 Chest pain, unspecified: Secondary | ICD-10-CM | POA: Diagnosis not present

## 2020-07-10 DIAGNOSIS — Z9181 History of falling: Secondary | ICD-10-CM | POA: Insufficient documentation

## 2020-07-10 DIAGNOSIS — T1490XA Injury, unspecified, initial encounter: Secondary | ICD-10-CM

## 2020-07-10 DIAGNOSIS — N1832 Chronic kidney disease, stage 3b: Secondary | ICD-10-CM | POA: Diagnosis not present

## 2020-07-10 DIAGNOSIS — R42 Dizziness and giddiness: Secondary | ICD-10-CM | POA: Diagnosis not present

## 2020-07-10 DIAGNOSIS — I509 Heart failure, unspecified: Secondary | ICD-10-CM | POA: Diagnosis not present

## 2020-07-10 DIAGNOSIS — I10 Essential (primary) hypertension: Secondary | ICD-10-CM | POA: Diagnosis present

## 2020-07-10 HISTORY — DX: Esophagitis, unspecified without bleeding: K20.90

## 2020-07-10 LAB — COMPREHENSIVE METABOLIC PANEL
ALT: 7 U/L (ref 0–44)
AST: 28 U/L (ref 15–41)
Albumin: 3.6 g/dL (ref 3.5–5.0)
Alkaline Phosphatase: 60 U/L (ref 38–126)
Anion gap: 7 (ref 5–15)
BUN: 22 mg/dL (ref 8–23)
CO2: 27 mmol/L (ref 22–32)
Calcium: 8.6 mg/dL — ABNORMAL LOW (ref 8.9–10.3)
Chloride: 105 mmol/L (ref 98–111)
Creatinine, Ser: 1.62 mg/dL — ABNORMAL HIGH (ref 0.44–1.00)
GFR, Estimated: 33 mL/min — ABNORMAL LOW (ref >60)
Glucose, Bld: 112 mg/dL — ABNORMAL HIGH (ref 70–99)
Potassium: 3.4 mmol/L — ABNORMAL LOW (ref 3.5–5.1)
Sodium: 139 mmol/L (ref 135–145)
Total Bilirubin: 0.6 mg/dL (ref 0.3–1.2)
Total Protein: 6 g/dL — ABNORMAL LOW (ref 6.5–8.1)

## 2020-07-10 LAB — CBC WITH DIFFERENTIAL/PLATELET
Abs Immature Granulocytes: 0.03 10*3/uL (ref 0.00–0.07)
Basophils Absolute: 0.1 10*3/uL (ref 0.0–0.1)
Basophils Relative: 1 %
Eosinophils Absolute: 0.1 10*3/uL (ref 0.0–0.5)
Eosinophils Relative: 1 %
HCT: 38.7 % (ref 36.0–46.0)
Hemoglobin: 13.2 g/dL (ref 12.0–15.0)
Immature Granulocytes: 0 %
Lymphocytes Relative: 35 %
Lymphs Abs: 2.8 10*3/uL (ref 0.7–4.0)
MCH: 32.2 pg (ref 26.0–34.0)
MCHC: 34.1 g/dL (ref 30.0–36.0)
MCV: 94.4 fL (ref 80.0–100.0)
Monocytes Absolute: 0.7 10*3/uL (ref 0.1–1.0)
Monocytes Relative: 8 %
Neutro Abs: 4.4 10*3/uL (ref 1.7–7.7)
Neutrophils Relative %: 55 %
Platelets: 237 10*3/uL (ref 150–400)
RBC: 4.1 MIL/uL (ref 3.87–5.11)
RDW: 13.6 % (ref 11.5–15.5)
WBC: 8 10*3/uL (ref 4.0–10.5)
nRBC: 0 % (ref 0.0–0.2)

## 2020-07-10 LAB — POCT UA - MICROALBUMIN
Creatinine, POC: 300 mg/dL
Microalbumin Ur, POC: 150 mg/L

## 2020-07-10 LAB — TROPONIN I (HIGH SENSITIVITY)
Troponin I (High Sensitivity): 135 ng/L (ref <18)
Troponin I (High Sensitivity): 87 ng/L — ABNORMAL HIGH (ref <18)

## 2020-07-10 LAB — POCT UA - MICROSCOPIC ONLY

## 2020-07-10 LAB — POCT URINALYSIS DIP (MANUAL ENTRY)
Glucose, UA: NEGATIVE mg/dL
Leukocytes, UA: NEGATIVE
Nitrite, UA: NEGATIVE
Protein Ur, POC: 30 mg/dL — AB
Spec Grav, UA: >=1.03 — AB (ref 1.010–1.025)
Urobilinogen, UA: 1 E.U./dL
pH, UA: 5.5 (ref 5.0–8.0)

## 2020-07-10 LAB — MAGNESIUM: Magnesium: 1.9 mg/dL (ref 1.7–2.4)

## 2020-07-10 LAB — BRAIN NATRIURETIC PEPTIDE: B Natriuretic Peptide: 507.7 pg/mL — ABNORMAL HIGH (ref 0.0–100.0)

## 2020-07-10 LAB — TSH: TSH: 0.795 u[IU]/mL (ref 0.350–4.500)

## 2020-07-10 MED ORDER — GABAPENTIN 300 MG PO CAPS: 300.0000 mg | ORAL_CAPSULE | ORAL | Status: DC

## 2020-07-10 MED ORDER — ACETAMINOPHEN 325 MG PO TABS
650.0000 mg | ORAL_TABLET | ORAL | Status: DC | PRN
  Administered 2020-07-12: 650 mg via ORAL
  Filled 2020-07-10: qty 2

## 2020-07-10 MED ORDER — METOPROLOL TARTRATE 25 MG PO TABS
25.0000 mg | ORAL_TABLET | Freq: Four times a day (QID) | ORAL | Status: DC
  Administered 2020-07-10 – 2020-07-11 (×5): 25 mg via ORAL
  Filled 2020-07-10 (×5): qty 1

## 2020-07-10 MED ORDER — ENOXAPARIN SODIUM 40 MG/0.4ML ~~LOC~~ SOLN: 40.0000 mg | SUBCUTANEOUS | Status: DC

## 2020-07-10 MED ORDER — METOPROLOL SUCCINATE ER 50 MG PO TB24: 75.0000 mg | ORAL_TABLET | Freq: Every day | ORAL | Status: DC

## 2020-07-10 MED ORDER — SERTRALINE HCL 100 MG PO TABS
100.0000 mg | ORAL_TABLET | Freq: Every day | ORAL | Status: DC
  Administered 2020-07-11 – 2020-07-13 (×3): 100 mg via ORAL
  Filled 2020-07-10 (×4): qty 1

## 2020-07-10 MED ORDER — DILTIAZEM HCL-DEXTROSE 125-5 MG/125ML-% IV SOLN (PREMIX)
5.0000 mg/h | INTRAVENOUS | Status: DC
  Administered 2020-07-10: 5 mg/h via INTRAVENOUS
  Filled 2020-07-10: qty 125

## 2020-07-10 MED ORDER — FLUTICASONE PROPIONATE 50 MCG/ACT NA SUSP
2.0000 | Freq: Every day | NASAL | Status: DC
  Filled 2020-07-10: qty 16

## 2020-07-10 MED ORDER — LACTATED RINGERS IV SOLN: INTRAVENOUS | Status: AC

## 2020-07-10 MED ORDER — CARBIDOPA-LEVODOPA 25-100 MG PO TABS
1.0000 | ORAL_TABLET | Freq: Every day | ORAL | Status: DC
  Administered 2020-07-10 – 2020-07-13 (×11): 1 via ORAL
  Filled 2020-07-10 (×16): qty 1

## 2020-07-10 MED ORDER — DILTIAZEM LOAD VIA INFUSION
20.0000 mg | Freq: Once | INTRAVENOUS | Status: AC
  Administered 2020-07-10: 20 mg via INTRAVENOUS
  Filled 2020-07-10: qty 20

## 2020-07-10 MED ORDER — ONDANSETRON HCL 4 MG PO TABS: 4.0000 mg | ORAL_TABLET | Freq: Three times a day (TID) | ORAL | 0 refills | Status: DC | PRN

## 2020-07-10 MED ORDER — GABAPENTIN 300 MG PO CAPS
600.0000 mg | ORAL_CAPSULE | Freq: Two times a day (BID) | ORAL | Status: DC
  Filled 2020-07-10: qty 2

## 2020-07-10 MED ORDER — PANTOPRAZOLE SODIUM 40 MG PO TBEC
40.0000 mg | DELAYED_RELEASE_TABLET | Freq: Every day | ORAL | Status: DC
  Administered 2020-07-11 – 2020-07-12 (×2): 40 mg via ORAL
  Filled 2020-07-10 (×3): qty 1

## 2020-07-10 MED ORDER — POTASSIUM CHLORIDE CRYS ER 20 MEQ PO TBCR
40.0000 meq | EXTENDED_RELEASE_TABLET | Freq: Once | ORAL | Status: AC
  Administered 2020-07-10: 40 meq via ORAL
  Filled 2020-07-10: qty 2

## 2020-07-10 MED ORDER — PANTOPRAZOLE SODIUM 40 MG PO TBEC: 40.0000 mg | DELAYED_RELEASE_TABLET | Freq: Every day | ORAL | Status: DC

## 2020-07-10 MED ORDER — GABAPENTIN 300 MG PO CAPS
900.0000 mg | ORAL_CAPSULE | Freq: Every day | ORAL | Status: DC
  Administered 2020-07-10: 900 mg via ORAL
  Filled 2020-07-10: qty 3

## 2020-07-10 NOTE — Progress Notes (Signed)
SUBJECTIVE:   CHIEF COMPLAINT / HPI:   Fall, nausea, vomiting - worried about kidney infection, worried about liver, "worried about everything", esophagus can't swallow food all the time - nausea and vomiting since started carbidopa-levodopa 8 years ago  - couple weeks of increased n/v - lots of diarrhea, some black stools x 1 week s/p pepto-bismol and other anti-diarrheal med - no hemoptysis - decreased urination, "not much at all", clear urine when she does - back aches so bad can hardly stand, s/p fall yesterday - intermittent hot spells since starting carbo-levo 7-8 years ago - fell yesterday, feet got caught up in each other, no syncope or LOC - did not hit head or wrist - last fall couple weeks ago, hit left shoulder - these two falls are first in a couple years, had been doing really well with physical therapy  Zofran not working very well Asks for something to "rest me", ie make her sleepy  PERTINENT  PMH / PSH: Parkinson's disease, HTN, pulm HTN, T2DM, CKD stage 3  OBJECTIVE:   BP 128/82   Pulse (!) 112   Wt 167 lb (75.8 kg)   SpO2 96%   BMI 30.06 kg/m    PHQ-9:  Depression screen [facility] 2/9 07/10/2020 09/22/2019 09/22/2019  Decreased Interest 3 2 2   Down, Depressed, Hopeless 3 2 2   PHQ - 2 Score 6 4 4   Altered sleeping 3 3 -  Tired, decreased energy 3 0 -  Change in appetite 2 3 -  Feeling bad or failure about yourself  0 0 -  Trouble concentrating 0 2 -  Moving slowly or fidgety/restless 0 3 -  Suicidal thoughts 0 0 -  PHQ-9 Score 14 15 -  Difficult doing work/chores - Very difficult -  Some recent data might be hidden     GAD-7: No flowsheet data found.   Physical Exam General: Awake, alert, interactive, anxious HEENT: left sided ptosis Cardiovascular: Irregularly irregular rhythm, normal rate, S1 and S2 present, no murmurs auscultated Respiratory: Lung fields clear to auscultation bilaterally Extremities: No bilateral lower extremity edema,  palpable pedal and pretibial pulses bilaterally Neuro: Pill rolling tremor in both hands Psych: Anxious, slowed speech, intermittent difficulty with word finding and enunciation    ASSESSMENT/PLAN:   New onset atrial fibrillation ([place]) Appreciated on physical exam, confirmed with EKG. Radial pulse difficult to palpate due to pill rolling tremor from known Parkinson's disease. No previously documented A. Fib in chart. Sent to hospital for direct admission.   Parkinson's disease ([place]) Slowed speech, intermittent difficulty with word finding and enunciation. Pill rolling tremor of bilateral hands at rest. Appears acutely worsened today per Dr. Nori Riis, who is PCP. Sent to hospital for direct admission.    Frequent falls Two falls in the last two weeks. Reports mechanical in nature, feet "got caught up with each other". Denies syncope, dizziness, LOC. Does not believe she hit her head. Last falls two years ago per patient. Sent to hospital for direct admission for new onset A. Fib as above.  - PT/OT eval and treat while inpatient - likely will DC to rehab for strength building after hospital stay   CKD stage 3 due to type 2 diabetes mellitus ([place]) Acute-on-chronic injury.  AKI evident on CMP with Cr 1.62, up from baseline of 1.15-1.3. UA negative for infection but demonstrates concentrated urine with SG >1.030, trace intact blood, 30 protein, and trace ketones. Micro urine eval shows many bacteria, 5-10 epithelial cells, and 0-3 hyaline  casts. UA microalbumin also elevated at 150, Cr 200, and albumin/creatinine ratio of 30-300.  - Direct admit to inpatient team      Ezequiel Essex, MD [place]

## 2020-07-10 NOTE — Progress Notes (Signed)
CALL PAGER [phone] for any questions or notifications regarding this patient  FMTS Attending Note: Mallory Mcmurray MD  I saw [name] in the [facility] clinic with Dr. Jeani Hawking this afternoon. She had several complaints, but the one that seemed most significant was several weeks of a "new" type of chest pain. She said it was squeezing or pressure like, came and went not related to activity. 3-7/10 and was frequently associated with SOB. No radiation, no lightheadedness,. She has also had some increase in her chronic nausea. It is unclear if that is related temporally to the chest pains.  In clinic we had difficulty getting an EKG due to technical issues. When it was finally obtained, it appears consistent with her clinical exam that revealed an irregularly irregular rhythm with elevated rate. Per  The EKG, she is in Atrial fibrillation with RVR.  I discussed options with her and we felt it best to admit her to telemetry, get repeat EKG, cardiac workup, potentially cardiology consult.  At time of admission to the hospital floor, she was not having any active chest pain, her BP was stable at 128/82, and she was tachycardic at about 110-114.  I have discussed with our inpatient team. Full H and P to follow.

## 2020-07-10 NOTE — Consult Note (Signed)
Cardiology Consult    Patient ID: [name] MRN: [id number], DOB/AGE: 1946/01/01   Admit date: 07/10/2020 Date of Consult: 07/10/2020  Primary Physician: [name], MD Primary Cardiologist: Fransico Him, MD Requesting Provider: [name], MD  Patient Profile    [name] is a 75 y.o. 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 She is being seen today for the evaluation of a new diagnosis of atrial fibrillation.  History of Present Illness    Ms. [name] tells me she has been having worsening issues with chronic nausea, vomiting, and diarrhea for which she sees GI. She denies any melena, hematochezia, hematemesis. She has been more unsteady on her feet and fell recently at home due to gait instability, no syncope or dizziness. No significant injuries. She feels like her Parkinson has been getting worse, hasn't actually seen neurology since 2020. She has chronic back pain as well and mobility has been poor. She saw her PCP earlier today due to the fall where she was found to have an irregular heart rhythm with ECG that showed atrial fibrillation with a rate of 114. A decision was made to admit her for this.   With regards to the AF, this is a new diagnosis for her. Last documented sinus rhythm was 12/2019. She reports feeling winded with certain activities like getting out of the bathtub. She also has intermittent episodes of chest discomfort that last just a few minutes, not related to exertion, nausea/vomiting, or eating - this has been going on since her 74s off and on. She has occasional palpitations, also ongoing for at least a few years. None of these symptoms are new and not  particularly worse recently. Occasional mild ankle swelling, none present, no orthopnea or PND. She sleeps very poorly. She was evaluated for the dyspnea and chest discomfort by Dr. Radford Pax in 2020. A ziopatch only showed sinus rhythm with occasional PACs and brief EATs. Lexiscan MPI was negative for ischemia with normal resting perfusion. She had an echo that was notable for mild-moderately elevated RVSP and basal septal hypertrophy with a non-obstructive LVOT peak gradient of 25 mmHg with valsalva. Further work-up with VQ was negative for PE, PFTs normal, and CMR showed small LV with moderate basal septal hypertrophy with possible LVOT obstruction based on flow dephasing, but no SAM.    Past Medical History   Past Medical History:  Diagnosis Date  . Allergy   . Anxiety   . Arthritis    "knees, back" (06/29/2014)  . ARTHRITIS, BACK 03/28/2009   Qualifier: Diagnosis of  By: Nori Riis MD, [name]    . CARPAL TUNNEL SYNDROME, LEFT 01/05/2008   Qualifier: Diagnosis of  By: Nori Riis MD, [name]    . Cataract   . Chronic bronchitis ([place])    "get it q yr"  . Chronic mid back pain   . Cognitive impairment 12/11/2013  . Complication of anesthesia 07/2008   "hard to get me woke up when I had my knee replaced; they said they had to bring me back"  . Dementia ([place])    "I have some; not dx'd by a dr" (08/06/2017)  . Diabetes mellitus type 2, diet-controlled ([place]) 08/05/2006       . Disorder of bursae and tendons  in shoulder region 01/24/2009   Qualifier: Diagnosis of  By: Nori Riis MD, [name]    . Esophageal abnormality 08/12/2010   Barium swallow 12/2011  IMPRESSION: Esophageal dysmotility.  No fixed esophageal narrowing or stricture. However, the barium tablet was transiently delayed at the GE junction.  Postsurgical changes at the GE junction. Gastroesophageal reflux is suspected, but could not be confirmed due to the patient's inability to clear her esophagus in the prone position.    . Esophageal stricture   . GERD  (gastroesophageal reflux disease)   . Hiatal hernia   . History of gout 1970's  . Hyperlipidemia   . Hypertension   . Major depressive disorder, recurrent episode ([place]) 06/11/2006   Qualifier: Diagnosis of  By: Beryle Lathe    . Movement disorder   . Neuromuscular disorder ([place])   . Osteoarthritis of multiple joints 08/04/2006   S/p R TKR Significant OA continues to be a problem in her left knee. Lower back OA Complicated by her parkinsonism    . Parkinson's disease ([place])   . Pulmonary HTN ([place])    moderate with PASP 50mmHg on echo 09/2019  . 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 SCOLIOSIS 03/28/2009   Qualifier: Diagnosis of  By: Nori Riis MD, [name]    . Situational depression   . Substance abuse ([place])   . Type II diabetes mellitus ([place])   . Ulcerative esophagitis   . UTI (lower urinary tract infection)     Past Surgical History:  Procedure Laterality Date  . ABDOMINAL HYSTERECTOMY    . CHOLECYSTECTOMY OPEN    . COLON SURGERY    . COLONOSCOPY  07/08/2019  . DILATION AND CURETTAGE OF UTERUS    . ESOPHAGOGASTRIC FUNDOPLASTY     some type "esoph surgery" per pt  . ESOPHAGOGASTRODUODENOSCOPY (EGD) WITH PROPOFOL N/A 08/05/2017   Procedure: ESOPHAGOGASTRODUODENOSCOPY (EGD) WITH PROPOFOL;  Surgeon: Jerene Bears, MD;  Location: [place];  Service: Gastroenterology;  Laterality: N/A;  . JOINT REPLACEMENT    . TOTAL KNEE ARTHROPLASTY Right 07/2008  . TUBAL LIGATION       Allergies  Allergen Reactions  . Ace Inhibitors Anaphylaxis and Swelling  . Azilect [Rasagiline Mesylate] Other (See Comments)    hypotension  . Lisinopril Swelling    Severe facial angioedema requiring hospitalization 2007 (approx)   Inpatient Medications    . carbidopa-levodopa  1 tablet Oral 5 X Daily  . fluticasone  2 spray Each Nare Daily  . gabapentin  300-600 mg Oral See admin instructions  . metoprolol tartrate  25 mg Oral Q6H  . [START ON 07/11/2020] pantoprazole  40 mg Oral Daily  .  potassium chloride  40 mEq Oral Once  . sertraline  100 mg Oral Daily    Family History    Family History  Problem Relation Age of Onset  . Heart disease Mother   . Diabetes Mother   . Heart disease Father   . Heart attack Sister   . Heart disease Sister   . Heart attack Brother   . Heart disease Brother   . Cerebral palsy Son   . Colon cancer Neg Hx   . Esophageal cancer Neg Hx   . Liver cancer Neg Hx   . Pancreatic cancer Neg Hx   . Stomach cancer Neg Hx   . Rectal cancer Neg Hx    She indicated that her mother is deceased. She indicated that her father is deceased. She indicated that her sister is alive.  She indicated that her brother is alive. She indicated that the status of her son is unknown. She indicated that the status of her neg hx is unknown.   Social History    Social History   Socioeconomic History  . Marital status: Married    Spouse name: [name]  . Number of children: 2  . Years of education: 34  . Highest education level: Not on file  Occupational History  . Occupation: Retired    Fish farm manager: NOT EMPLOYED  Tobacco Use  . Smoking status: Never Smoker  . Smokeless tobacco: Never Used  Vaping Use  . Vaping Use: Never used  Substance and Sexual Activity  . Alcohol use: Yes    Comment: Occasional  . Drug use: No  . Sexual activity: Not on file  Other Topics Concern  . Not on file  Social History Narrative   Health Care POA:    Emergency Contact: daughter, [name], [phone] husband, [name] [place]:   Who lives with you: Lives with husband 02/07/19   Any pets: Rabbit, [name]   Diet: Patient has a varied diet but struggles with what to eat with hypertension, diabetes, parkinsons   Exercise: Patient does not have any regular exercise routine.   Seatbelts: Patient reports wearing her seatbelt when in vehicle.   Nancy Fetter Exposure/Protection: Patient reports wearing sun block lotion daily.   Hobbies: Watching game shows, writing  poetry, writing in journal, volunteering at day program with son.    Caffeine Use: very little occasional      02/07/19 caregiver 2 hrs 2-3 x weekly   Social Determinants of Health   Financial Resource Strain: Not on file  Food Insecurity: Not on file  Transportation Needs: Not on file  Physical Activity: Not on file  Stress: Not on file  Social Connections: Not on file  Intimate Partner Violence: Not on file     Review of Systems    A comprehensive review of systems was performed with pertinent positives and negatives noted in the HPI.  Physical Exam    Blood pressure (!) 102/56, pulse (!) 106, temperature 98.7 F (37.1 C), temperature source Oral, resp. rate 16, SpO2 95 %.    No intake or output data in the 24 hours ending 07/10/20 2132 Wt Readings from Last 3 Encounters:  07/10/20 75.8 kg  05/17/20 79.1 kg  12/16/19 81.5 kg    CONSTITUTIONAL: alert, conversant, chronically ill appearing, no acute distress HEENT: Tardive dyskinesia of jaw and tongue. Dry mucous membranes. Conjunctiva normal, EOM grossly intact, pupils equal.  CARDIOVASCULAR: Irregular rhythm. Muffled heart sounds. No appreciable gallop, murmur, or rub. JVP is normal. Radial pulses intact.  PULMONARY/CHEST WALL: no deformities, diminished breath sounds bilaterally, poor chest expansion, work of breathing becomes slightly more labored with repeated deep inspiration. ABDOMINAL: soft, non-tender, non-distended EXTREMITIES: no edema, +musclular atrophy, warm and well-perfused, no clubbing or cyanosis.Marland Kitchen SKIN: Dry and intact without apparent rashes or wounds. NEUROLOGIC: alert, tardive dyskinesia   Labs    Recent Labs    07/10/20 1828  TROPONINIHS 135*      Component Value Date/Time   BNP 507.7 (H) 07/10/2020 1821      Component Value Date/Time   PROBNP 148 09/27/2018 1317   Lab Results  Component Value Date   WBC 8.0 07/10/2020   HGB 13.2 07/10/2020   HCT 38.7 07/10/2020   MCV 94.4 07/10/2020    PLT 237 07/10/2020    Recent Labs  Lab 07/10/20 1828  NA 139  K 3.4*  CL 105  CO2 27  BUN 22  CREATININE 1.62*  CALCIUM 8.6*  PROT 6.0*  BILITOT 0.6  ALKPHOS 60  ALT 7  AST 28  GLUCOSE 112*   Lab Results  Component Value Date   CHOL 244 (H) 06/07/2014   HDL 38.60 (L) 06/07/2014   LDLCALC 123 (H) 12/11/2013   TRIG 384.0 (H) 06/07/2014     Radiology Studies    CT HEAD WO CONTRAST  Result Date: 07/11/2020 CLINICAL DATA:  Un witnessed fall with headaches and dizziness EXAM: CT HEAD WITHOUT CONTRAST TECHNIQUE: Contiguous axial images were obtained from the base of the skull through the vertex without intravenous contrast. COMPARISON:  12/06/2013 FINDINGS: Brain: Mild atrophic changes and chronic white matter ischemic changes are noted. The white matter changes have progressed in the interval from the prior exam. No findings to suggest acute hemorrhage, acute infarction or space-occupying mass lesion are seen. Vascular: No hyperdense vessel or unexpected calcification. Skull: Normal. Negative for fracture or focal lesion. Sinuses/Orbits: No acute finding. Other: None. IMPRESSION: Chronic changes without acute abnormality. Electronically Signed   By: Inez Catalina M.D.   On: 07/11/2020 00:03    ECG & Cardiac Imaging    ECG shows atrial fibrillation with ventricular rate of 114 and nonspecific ST-T wave changes - personally reviewed.   Prior echocardiograms, ECGs, Ziopatch monitor, stress test, and cardiac MRI personally reviewed and summarized in the HPI.  Assessment & Plan    Atrial fibrillation: She is currently reasonably rate controlled with her home dose of metoprolol. The onset of her AF is very unclear as her symptoms are all not well correlated and more long-standing. Last documented sinus rhythm was 12/2019, though HR was 55 in clinic 2 months ago, so unlikely she was in AF at that time. She is not very active and her multitude of symptoms all seem much more chronic  without any acute worsening recently that could be attributed to AF. She appears euvolemic. Given her extensive history of dysphagia and esophageal pathology, her TEE risk is certainly elevated with last EGD 3 years ago. Given her stable hgb and no symptoms of upper GIB, TEE/DCCV would be reasonable if barium swallow were normal without repeat EGD. It is difficult to say if any of her dyspnea is related without trial of restoring sinus rhythm. If she were to have recurrence without clear change in symptoms, I think rate control would be a reasonable option going forward. CHADS2-VASc is 6, high stroke risk and documented resolution of the esophageal ulceration.  - Start Eliquis 5mg  BID - Metoprolol 25mg  every 6 hours for continued rate control. (already given diltiazem IV bolus) - Possible TEE/DCCV tomorrow. Keep NPO after midnight. She would need barium swallow study prior to TEE.  Chronic chest pain: The chest pain seems non-cardiac and is very chronic without recent change in symptoms, previously evaluated without evidence of ischemia. Troponin is likely due to atrial fibrillation in setting of CKD. No indication to trend further.   HFpEF, PH, exertional dyspnea: The exertional dyspnea is long standing and she has had a quite extensive work-up previously, detailed in the HPI. Probably multifactorial in the setting of HFpEF, worsening Parkinson disease, deconditioning, and possibly the AF as well. She currently is euvolemic.   Signed, Marykay Lex, MD 07/10/2020, 9:32 PM  For questions or updates, please contact   Please consult [email or web address] for contact info under Cardiology/STEMI.

## 2020-07-10 NOTE — H&P (Addendum)
[facility] Admission History and Physical Service Pager: [phone]  [name] Medical record number: [id number] Date of birth: 1946/01/17 Age: 75 y.o. Gender: female  Primary Care Provider: [name], MD Consultants: Cardiology Code Status: Full Preferred Emergency Contact:  Contact Information    Name Relation Home Work [place] Spouse [phone]  [phone]   [facility] Daughter [phone]      Chief Complaint: Chest pain   Assessment and Plan: [name] is a 75 y.o. female presenting with chest pain, found to be in new onset Atrial fibrillation with RVR. PMH is significant for hypertension, pulmonary hypertension, DM2, hyperlipidemia, CKD stage III, Parkinson's disease  New onset A. Fib w/ RVR  Chest pain  Hx of G2DD Patient presents with ~2 weeks of worsening of her chronic chest pain on exertion and at rest with associated shortness of breath and palpitations.  Patient seen in clinic today with abnormal EKG showing irregularly irregular rhythm with elevated heart rate concerning for new onset A. fib with RVR.  Patient previously seen by Dr. Radford Pax (cardiology), last seen on 12/16/2019.  Prior cardiac studies include a Lexiscan on 09/2018 without signs of ischemia, Cardiac MRI suboptimal but with concern for possible HOCM, echo on 09/2019 normal LVEF and G2 DD with moderate pulmonary HTN. Labs collected upon arrival as direct admit and notable for Troponin 135, BNP 507.7, CR 1.62, mag 1.9, K 3.4, CBC WNL, TSH 0.795.  EKG abnormality of some ST abnormalities and T wave abnormality/inversions in anterolateral leads. HR ranging 100-130bpm during exam. Current labs and EKG findings are likely secondary to demand ischemia in the setting of Afib with RVR. No ST elevations at this time, do not feel that patient had an MI that may have precipitated the Afib; likely a demand  ischemia present. Labs not concerning for hyper/hypothyroidism. ChadsVAsc score of 6, will need anticoagulation. Patient already predisposed to Afib with her known structural heart disease, HTN, HLD, and age, however do suspect recent V/D with subsequent dehydration (evident by dry MM and urine SG >1.030) is contributing to current presentation. Doubt underlying precipitating infection, however as she has had some urinary changes, will assess for UTI as discussed below. - Admit to [place], attending Dr. Nori Riis - Cardiology consulted, appreciate recommendations (may transition to metoprolol q6 instead of dilt drip)  - Trend troponin - Repeat EKG in am  - Continuous cardiac telemetry - Echocardiogram - Follow-up head CT prior to treatment dose of lovenox for anticoagulation - Diltiazem drip - Continue home metoprolol - LR 75 ml/hr overnight   Recent fall with head injury, no LOC Patient reported fall on 3/28 while getting out of the bathtub with backwards fall onto buttocks (no remaining discomfort) and reported that she hit her head.  Patient has had increased dizziness and some mild increased confusion since fall.  Physical exam without hematoma or tenderness to palpation, no focal neuro deficits. While low concern for intracranial hemorrhage, given that patient is a poor historian and had an unwitnessed fall (with HA/Dizzy, that may be from A fib) with anticipation to start full dose anticoagulation for A. Fib as above, will obtain CT head to rule this out prior to. - CT head without contrast - Hold anticoagulation until CT completed -PT/OT -Encourage patient to use assistive device more regularly  Hypertension Blood pressure on admission 113/88. Home med: Metoprolol 75mg  daily. - Continue home metoprolol  - Continue to monitor BP  Hypokalemia Potassium  3.4. Goal of >4. - Replete with 64mEq - BMP in the AM  Dehydration 2/2 Vomiting  AKI on CKD stage IIIB Patient reports recent vomiting  with decreased oral intake and decreased urinary frequency.  Vomiting multifactorial, in the setting of nausea due to chronic necessary medications and self-induced to relief worsening Parkinson's related dysphagia.  Labs on admission significant for CR of 1.62, GFR 33. Baseline Cr appears to be around 1.2-1.3.  - IVF LR @75mL /h - Monitor creatinine on BMP daily  Abnormal UA Patient is reported intermittent decreased urinary frequency, denies dysuria.  Patient also has Parkinson's disease, which likely complicates presentation.  UA on admission significant for specific gravity >1.030, many bacteria, nitrite negative, leukocyte negative.  At this time, believe patient is likely more dehydrated with her current symptoms rather than having a UTI, but will collect urine culture and follow to ensure patient does not need further treatment. - Follow-up urine culture  Parkinson's disease  Parkinson's related dysphagia Increased nausea and vomiting secondary to Parkinson's medications. Worsening dysphagia with patient reporting difficulty swallowing and sometimes having to Resting tremors at baseline. Home med: Carbidopa-levodopa.  - Continue home Carbidopa-Levodopa five times daily  -Consult SLP for swallow evaluation  Type 2 diabetes Last HbA1c on 07/14/2019 was 5.7. Patient is currently diet controlled. - F/u HbA1c - Monitor BMP for glucose, consider adding on SSI if persistent hyperglycemia  Anxiety  Home med: Zoloft 100mg  daily  - Continue home zoloft  Dark Stools:  Per clinic note only, patient did not mention or endorse during our conversation. Do note she has been taking Pepto-bismol which is the likely etiology of color change. Reassuringly her hemoglobin is normal at 13.2 and above baseline of 11 (may have some mild hemoconcentration). Colonoscopy in 2021 with pre-cancerous polyps, recommended recall in 2024 pending clinical status at that age. Low concern for GI bleed at this time. Benefit  of full anti-coagulation for new onset A. Fib (after CT) as above outweighs possible risk of this currently.  --Monitor stool  --Follow CBC  --Follow up with GI outpatient   FEN/GI: LR @ 64mL/h; heart healthy diet, NPO at MN Prophylaxis: Will start full dose lovenox after CT scan if negative  Disposition: Cardiac telemetry  History of Present Illness:  [name] is a 75 y.o. female presenting with increasing chest pain and shortness of breath, directly admitted from [facility] clinic due to new onset A. fib with RVR.  Patient reported that on 3/28, she had a fall while getting out of the bathtub where she landed on her buttocks and hit the back of her head.  She denies any LOC, but fall was unwitnessed and has been found her on the floor trying to get up.  Patient is reporting increased dizziness since this fall, and then she woke up with a headache this morning-denies any focal weakness/numbness or vision changes.  Patient was seen in the clinic today and reported about 2 weeks of nausea and vomiting with diarrhea and black stools (also takes Pepto-Bismol around the same time). No bright red blood per rectum or hematochezia. She endorses a chronic history of alternating diarrhea and constipation since parkinson's diagnosis.   Patient has an extensive history of shortness of breath and intermittent chest pain over the last several decades, she does report that there has been an increase/change in the pain in the last 2 weeks/months.  She reports that this pain happens during exertion but also occurs at rest, it is associated with shortness  of breath as well as palpitations.  She reports these episodes last about 2 minutes and happen at least once a day-she reports chest pain is resolved during our examination.   Patient reports that she has had decreased urination over the last week or so.  She states that she has been having increasing episodes of food containing emesis and that "sometimes I  try to force myself to vomit" because she feels like she has something stuck in her throat, which is a common sensation for her.  She previously has had "throat surgery" and did well, but has noticed in the last 3 years that she has had some difficulty with swallowing and frequently has to vomit back up her food.  Worse in the last few weeks. She reports no associated abdominal pain.  Does eat less food, tries to maintain hydration but has not been doing the best lately.  Also reports intermittent diarrhea and constipation stating that "my bowels have not worked since I got Parkinson's". No fever or associated fatigue, no known sick contacts. Lives with husband.   Patient reports that at baseline, she intermittently has some confusion and her husband helps her around the house.  She has a walker which she uses in the public or outside, but does not use a device in her home.  In the clinic, EKG was obtained showing irregularly irregular rhythm and patient was direct admitted to the hospital for new onset atrial fibrillation with RVR.   Review Of Systems: Per HPI with the following additions:   Review of Systems  Constitutional: Negative for fever.  HENT: Positive for trouble swallowing. Negative for congestion.   Eyes: Negative for visual disturbance.  Respiratory: Positive for shortness of breath. Negative for cough.   Cardiovascular: Positive for chest pain and palpitations.       No current chest pain  Gastrointestinal: Positive for constipation, diarrhea, nausea and vomiting. Negative for abdominal pain and blood in stool.  Genitourinary: Positive for decreased urine volume. Negative for difficulty urinating, dysuria and urgency.  Musculoskeletal: Positive for back pain.  Neurological: Positive for dizziness, tremors and headaches. Negative for syncope.  Psychiatric/Behavioral: Positive for confusion.     Patient Active Problem List   Diagnosis Date Noted  . Chest pain 07/10/2020  .  Atrial fibrillation with RVR ([place]) 07/10/2020  . Pulmonary HTN ([place])   . Urinary frequency 09/22/2019  . Abdominal pain, chronic, right upper quadrant 09/22/2019  . Greater trochanteric bursitis of right hip 07/14/2019  . Acute cystitis without hematuria 02/02/2019  . Hiatal hernia with GERD and esophagitis 08/20/2017  . Acute esophagitis   . Hematemesis 08/04/2017  . Hematochezia 08/04/2017  . Osteoarthritis of left knee 07/28/2017  . Dyspnea on exertion 11/14/2016  . Frequent falls 05/14/2016  . Tinnitus 02/15/2015  . CKD stage 3 due to type 2 diabetes mellitus ([place]) 02/15/2015  . Chronic pain syndrome 06/15/2014  . Left knee pain 05/17/2014  . Urticaria, idiopathic 12/21/2013  . Cognitive impairment 12/11/2013  . B12 deficiency 10/17/2013  . Dysphagia 06/02/2013  . Parkinson's disease ([place]) 01/18/2013  . 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 Substance abuse, episodic 09/20/2010  . Esophageal abnormality 08/12/2010  . ARTHRITIS, BACK 03/28/2009  . SCOLIOSIS 03/28/2009  . Disorder of bursae and tendons in shoulder region 01/24/2009  . CARPAL TUNNEL SYNDROME, LEFT 01/05/2008  . Diabetes mellitus type 2, diet-controlled ([place]) 08/05/2006  . Hyperlipidemia 08/05/2006  . Essential hypertension 08/05/2006  . Osteoarthritis of multiple joints 08/04/2006  .  Major depressive disorder, recurrent episode ([place]) 06/11/2006    Past Medical History: Past Medical History:  Diagnosis Date  . Allergy   . Anxiety   . Arthritis    "knees, back" (06/29/2014)  . ARTHRITIS, BACK 03/28/2009   Qualifier: Diagnosis of  By: Nori Riis MD, [name]    . CARPAL TUNNEL SYNDROME, LEFT 01/05/2008   Qualifier: Diagnosis of  By: Nori Riis MD, [name]    . Cataract   . Chronic bronchitis ([place])    "get it q yr"  . Chronic mid back pain   . Cognitive impairment 12/11/2013  . Complication of anesthesia 07/2008   "hard to get me woke up when I had my knee replaced; they said they had to bring me back"  .  Dementia ([place])    "I have some; not dx'd by a dr" (08/06/2017)  . Diabetes mellitus type 2, diet-controlled ([place]) 08/05/2006       . Disorder of bursae and tendons in shoulder region 01/24/2009   Qualifier: Diagnosis of  By: Nori Riis MD, [name]    . Esophageal abnormality 08/12/2010   Barium swallow 12/2011  IMPRESSION: Esophageal dysmotility.  No fixed esophageal narrowing or stricture. However, the barium tablet was transiently delayed at the GE junction.  Postsurgical changes at the GE junction. Gastroesophageal reflux is suspected, but could not be confirmed due to the patient's inability to clear her esophagus in the prone position.    . Esophageal stricture   . GERD (gastroesophageal reflux disease)   . Hiatal hernia   . History of gout 1970's  . Hyperlipidemia   . Hypertension   . Major depressive disorder, recurrent episode ([place]) 06/11/2006   Qualifier: Diagnosis of  By: Beryle Lathe    . Movement disorder   . Neuromuscular disorder ([place])   . Osteoarthritis of multiple joints 08/04/2006   S/p R TKR Significant OA continues to be a problem in her left knee. Lower back OA Complicated by her parkinsonism    . Parkinson's disease ([place])   . Pulmonary HTN ([place])    moderate with PASP 66mmHg on echo 09/2019  . 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 SCOLIOSIS 03/28/2009   Qualifier: Diagnosis of  By: Nori Riis MD, [name]    . Situational depression   . Substance abuse ([place])   . Type II diabetes mellitus ([place])   . Ulcerative esophagitis   . UTI (lower urinary tract infection)     Past Surgical History: Past Surgical History:  Procedure Laterality Date  . ABDOMINAL HYSTERECTOMY    . CHOLECYSTECTOMY OPEN    . COLON SURGERY    . COLONOSCOPY  07/08/2019  . DILATION AND CURETTAGE OF UTERUS    . ESOPHAGOGASTRIC FUNDOPLASTY     some type "esoph surgery" per pt  . ESOPHAGOGASTRODUODENOSCOPY (EGD) WITH PROPOFOL N/A 08/05/2017   Procedure: ESOPHAGOGASTRODUODENOSCOPY (EGD) WITH PROPOFOL;   Surgeon: Jerene Bears, MD;  Location: [facility];  Service: Gastroenterology;  Laterality: N/A;  . JOINT REPLACEMENT    . TOTAL KNEE ARTHROPLASTY Right 07/2008  . TUBAL LIGATION      Social History: Social History   Tobacco Use  . Smoking status: Never Smoker  . Smokeless tobacco: Never Used  Vaping Use  . Vaping Use: Never used  Substance Use Topics  . Alcohol use: Yes    Comment: Occasional  . Drug use: No   Additional social history: Lives with husband. Occasional alcohol use.  Please also refer to relevant sections of EMR.  Family  History: Family History  Problem Relation Age of Onset  . Heart disease Mother   . Diabetes Mother   . Heart disease Father   . Heart attack Sister   . Heart disease Sister   . Heart attack Brother   . Heart disease Brother   . Cerebral palsy Son   . Colon cancer Neg Hx   . Esophageal cancer Neg Hx   . Liver cancer Neg Hx   . Pancreatic cancer Neg Hx   . Stomach cancer Neg Hx   . Rectal cancer Neg Hx     Allergies and Medications: Allergies  Allergen Reactions  . Ace Inhibitors Anaphylaxis and Swelling  . Azilect [Rasagiline Mesylate] Other (See Comments)    hypotension  . Lisinopril Swelling    Severe facial angioedema requiring hospitalization 2007 (approx)   No current facility-administered medications on file prior to encounter.   Current Outpatient Medications on File Prior to Encounter  Medication Sig Dispense Refill  . carbidopa-levodopa (SINEMET IR) 25-100 MG tablet Take 1 tablet by mouth 5 (five) times daily. 450 tablet 4  . gabapentin (NEURONTIN) 300 MG capsule Take 300-600 mg by mouth See admin instructions. Take  2 capsules in the morning and 2 capsules at lunch 3 capsule in the evening as needed for pain    . loperamide (IMODIUM A-D) 2 MG tablet Take 2 mg by mouth 4 (four) times daily as needed for diarrhea or loose stools.    . metoprolol succinate (TOPROL-XL) 50 MG 24 hr tablet Take 1.5 tablets (75 mg total) by  mouth daily. 135 tablet 0  . omeprazole (PRILOSEC) 40 MG capsule TAKE 1 CAPSULE BY MOUTH TWICE DAILY (Patient taking differently: Take 40 mg by mouth in the morning and at bedtime.) 90 capsule 3  . sertraline (ZOLOFT) 100 MG tablet Take 200 mg by mouth daily. Per psych    . traMADol (ULTRAM) 50 MG tablet TAKE 1 TO 2 TABLETS BY MOUTH TWICE DAILY AS NEEDED FOR CHRONIC KNEE PAIN (Patient taking differently: Take 50-100 mg by mouth 2 (two) times daily as needed (chronic knee pain).) 120 tablet 3  . nystatin-triamcinolone ointment (MYCOLOG) Apply 1 application topically 3 (three) times daily. 30 g 1  . ondansetron (ZOFRAN) 4 MG tablet Take 1 tablet (4 mg total) by mouth every 8 (eight) hours as needed for nausea or vomiting. 21 tablet 0    Objective: BP (!) 102/56 (BP Location: Left Arm)   Pulse (!) 106   Temp 98.7 F (37.1 C) (Oral)   Resp 16   SpO2 95%  Exam: General -- oriented x3, pleasant and cooperative. HEENT -- Head is normocephalic, no occipital hematoma seen on exam. PERRLA. EOMI. Ears, nose and throat were benign. Neck -- supple; no bruits. Integument -- intact. No rash, erythema, or ecchymoses. No bruising on occiput noted, skin and mucous membranes appear somewhat dry. Chest -- good expansion. Lungs clear to auscultation, comfortable on room air, no increased WOB Cardiac -- tachycardic, irregularly irregular rhythm, no murmur appreciated Abdomen -- soft, non-tender with exception of mild tenderness to the epigastrium without rebounding or guarding. No masses palpable. Bowel sounds present. CNS --Alert and oriented. Cranial nerves II through XII grossly intact. No nystagmus. Sensation to light touch intact throughout. Resting bilaterally hands/feet tremor, more pronounced on the left (however alternated during exam). Central tongue protrusion with tremor. Extremeties - no tenderness or effusions noted. 5/5 bilateral strength in BLE and BUE. Dorsalis pedis pulses present and  symmetrical. No LE edema  bilaterally.    Labs and Imaging: CBC BMET  Recent Labs  Lab 07/10/20 1828  WBC 8.0  HGB 13.2  HCT 38.7  PLT 237   Recent Labs  Lab 07/10/20 1828  NA 139  K 3.4*  CL 105  CO2 27  BUN 22  CREATININE 1.62*  GLUCOSE 112*  CALCIUM 8.6*     EKG: Irregularly irregular rhythm, tachycardic at 106, T wave inversions in anterolateral leads,  ST abnormalities in anterolateral leads as well.   Rise Patience, DO 07/10/2020, 10:00 PM PGY-1, [facility] Intern pager: [phone], text pages welcome  FPTS Upper-Level Resident Addendum   I have independently interviewed and examined the patient. I have discussed the above with the original author and agree with their documentation. My edits for correction/addition/clarification are added. Please see also any attending notes.    Patriciaann Clan, DO  Family Medicine PGY-3

## 2020-07-10 NOTE — Patient Instructions (Signed)
It was wonderful to meet you today. Thank you for allowing me to be a part of your care. Below is a short summary of what we discussed at your visit today:  Nausea and vomiting - STOP taking your lasix (furosemide) - I have sent a script for zofran to your pharmacy. Use as needed for nausea and vomiting per the directions on the label - We have drawn some blood work on your to make sure everything is normal. I will send you a letter or MyChart message for normal results, I will call you for abnormal results.  - Follow up in 1-2 weeks with myself or Dr. Nori Riis  Urine complaints Today we got a urine sample to see if you have an infection. I will call you with the results.   Heart rhythm Today we obtained an EKG to look at your heart rhythm and ensure that it is normal.   Screening tests Since we were already getting blood work, I added on some normal screening labs including lipid panel (choelsterol), A1c (average blood sugar measurement), and Hepatitis C (recommended for every adult once in their lifetime).   Please bring all of your medications to every appointment!  If you have any questions or concerns, please do not hesitate to contact us via phone or MyChart message.   Ezequiel Essex, MD

## 2020-07-10 NOTE — Telephone Encounter (Signed)
Patient returns call to nurse line requesting to schedule appointment. Patient states that while nausea/vomiting has improved, she had a fall yesterday.  Patient denies LOC or head injury. Reports generalized soreness. Patient continues to have intermittent dizziness. Encouraged patient to change positions slowly and continue drinking plenty of fluids.   Patient denies fever, cough, congestion. Patient has documented history of nausea/vomiting, that she treats with Zofran.  Scheduled patient with Dr. Jeani Hawking this afternoon. Strict ED precautions given.   Talbot Grumbling, RN

## 2020-07-11 ENCOUNTER — Observation Stay (HOSPITAL_COMMUNITY): Payer: Medicare PPO

## 2020-07-11 ENCOUNTER — Encounter (HOSPITAL_COMMUNITY): Payer: Self-pay | Admitting: Family Medicine

## 2020-07-11 DIAGNOSIS — R079 Chest pain, unspecified: Secondary | ICD-10-CM

## 2020-07-11 DIAGNOSIS — R1310 Dysphagia, unspecified: Secondary | ICD-10-CM | POA: Diagnosis not present

## 2020-07-11 DIAGNOSIS — I4891 Unspecified atrial fibrillation: Secondary | ICD-10-CM | POA: Diagnosis not present

## 2020-07-11 DIAGNOSIS — G20 Parkinson's disease: Secondary | ICD-10-CM

## 2020-07-11 DIAGNOSIS — R2681 Unsteadiness on feet: Secondary | ICD-10-CM | POA: Diagnosis not present

## 2020-07-11 DIAGNOSIS — I272 Pulmonary hypertension, unspecified: Secondary | ICD-10-CM

## 2020-07-11 DIAGNOSIS — R0789 Other chest pain: Secondary | ICD-10-CM | POA: Diagnosis not present

## 2020-07-11 DIAGNOSIS — Z79899 Other long term (current) drug therapy: Secondary | ICD-10-CM | POA: Diagnosis not present

## 2020-07-11 DIAGNOSIS — K449 Diaphragmatic hernia without obstruction or gangrene: Secondary | ICD-10-CM | POA: Diagnosis not present

## 2020-07-11 DIAGNOSIS — I10 Essential (primary) hypertension: Secondary | ICD-10-CM

## 2020-07-11 DIAGNOSIS — K219 Gastro-esophageal reflux disease without esophagitis: Secondary | ICD-10-CM | POA: Diagnosis not present

## 2020-07-11 DIAGNOSIS — K222 Esophageal obstruction: Secondary | ICD-10-CM | POA: Diagnosis not present

## 2020-07-11 DIAGNOSIS — K2100 Gastro-esophageal reflux disease with esophagitis, without bleeding: Secondary | ICD-10-CM | POA: Diagnosis not present

## 2020-07-11 DIAGNOSIS — K224 Dyskinesia of esophagus: Secondary | ICD-10-CM | POA: Diagnosis not present

## 2020-07-11 DIAGNOSIS — E119 Type 2 diabetes mellitus without complications: Secondary | ICD-10-CM

## 2020-07-11 DIAGNOSIS — I5033 Acute on chronic diastolic (congestive) heart failure: Secondary | ICD-10-CM | POA: Diagnosis not present

## 2020-07-11 DIAGNOSIS — R2689 Other abnormalities of gait and mobility: Secondary | ICD-10-CM | POA: Diagnosis not present

## 2020-07-11 DIAGNOSIS — Z20822 Contact with and (suspected) exposure to covid-19: Secondary | ICD-10-CM | POA: Diagnosis not present

## 2020-07-11 DIAGNOSIS — K3189 Other diseases of stomach and duodenum: Secondary | ICD-10-CM | POA: Diagnosis not present

## 2020-07-11 LAB — CBC
HCT: 35.5 % — ABNORMAL LOW (ref 36.0–46.0)
Hemoglobin: 12.2 g/dL (ref 12.0–15.0)
MCH: 32.5 pg (ref 26.0–34.0)
MCHC: 34.4 g/dL (ref 30.0–36.0)
MCV: 94.7 fL (ref 80.0–100.0)
Platelets: 212 10*3/uL (ref 150–400)
RBC: 3.75 MIL/uL — ABNORMAL LOW (ref 3.87–5.11)
RDW: 13.5 % (ref 11.5–15.5)
WBC: 6.4 10*3/uL (ref 4.0–10.5)
nRBC: 0 % (ref 0.0–0.2)

## 2020-07-11 LAB — BASIC METABOLIC PANEL
Anion gap: 7 (ref 5–15)
BUN: 19 mg/dL (ref 8–23)
CO2: 28 mmol/L (ref 22–32)
Calcium: 8.7 mg/dL — ABNORMAL LOW (ref 8.9–10.3)
Chloride: 105 mmol/L (ref 98–111)
Creatinine, Ser: 1.45 mg/dL — ABNORMAL HIGH (ref 0.44–1.00)
GFR, Estimated: 38 mL/min — ABNORMAL LOW (ref >60)
Glucose, Bld: 98 mg/dL (ref 70–99)
Potassium: 3.3 mmol/L — ABNORMAL LOW (ref 3.5–5.1)
Sodium: 140 mmol/L (ref 135–145)

## 2020-07-11 LAB — RESPIRATORY PANEL BY PCR

## 2020-07-11 LAB — HEPARIN LEVEL (UNFRACTIONATED): Heparin Unfractionated: 0.56 IU/mL (ref 0.30–0.70)

## 2020-07-11 MED ORDER — POTASSIUM CHLORIDE CRYS ER 20 MEQ PO TBCR: 40.0000 meq | EXTENDED_RELEASE_TABLET | Freq: Two times a day (BID) | ORAL | Status: DC

## 2020-07-11 MED ORDER — ENOXAPARIN SODIUM 80 MG/0.8ML ~~LOC~~ SOLN: 1.0000 mg/kg | Freq: Two times a day (BID) | SUBCUTANEOUS | Status: DC

## 2020-07-11 MED ORDER — GABAPENTIN 400 MG PO CAPS
400.0000 mg | ORAL_CAPSULE | Freq: Every day | ORAL | Status: DC
  Administered 2020-07-11 – 2020-07-12 (×2): 400 mg via ORAL
  Filled 2020-07-11 (×2): qty 1

## 2020-07-11 MED ORDER — SODIUM CHLORIDE 0.9 % IV SOLN: INTRAVENOUS | Status: DC

## 2020-07-11 MED ORDER — APIXABAN 5 MG PO TABS: 5.0000 mg | ORAL_TABLET | Freq: Two times a day (BID) | ORAL | Status: DC

## 2020-07-11 MED ORDER — ENOXAPARIN SODIUM 80 MG/0.8ML ~~LOC~~ SOLN
1.0000 mg/kg | Freq: Two times a day (BID) | SUBCUTANEOUS | Status: DC
  Administered 2020-07-11: 75 mg via SUBCUTANEOUS
  Filled 2020-07-11: qty 0.8

## 2020-07-11 MED ORDER — POTASSIUM CHLORIDE CRYS ER 20 MEQ PO TBCR: 40.0000 meq | EXTENDED_RELEASE_TABLET | Freq: Once | ORAL | Status: DC

## 2020-07-11 MED ORDER — MAGNESIUM SULFATE IN D5W 1-5 GM/100ML-% IV SOLN
1.0000 g | Freq: Once | INTRAVENOUS | Status: AC
  Administered 2020-07-11: 1 g via INTRAVENOUS
  Filled 2020-07-11: qty 100

## 2020-07-11 MED ORDER — POTASSIUM CHLORIDE 20 MEQ PO PACK
40.0000 meq | PACK | Freq: Two times a day (BID) | ORAL | Status: AC
  Administered 2020-07-11 (×2): 40 meq via ORAL
  Filled 2020-07-11 (×2): qty 2

## 2020-07-11 MED ORDER — POTASSIUM CHLORIDE CRYS ER 20 MEQ PO TBCR
30.0000 meq | EXTENDED_RELEASE_TABLET | Freq: Two times a day (BID) | ORAL | Status: DC
  Filled 2020-07-11: qty 1

## 2020-07-11 MED ORDER — GABAPENTIN 100 MG PO CAPS
200.0000 mg | ORAL_CAPSULE | Freq: Two times a day (BID) | ORAL | Status: DC
  Administered 2020-07-11 – 2020-07-13 (×5): 200 mg via ORAL
  Filled 2020-07-11 (×5): qty 2

## 2020-07-11 MED ORDER — HEPARIN (PORCINE) 25000 UT/250ML-% IV SOLN
1100.0000 [IU]/h | INTRAVENOUS | Status: AC
  Administered 2020-07-11: 1100 [IU]/h via INTRAVENOUS
  Filled 2020-07-11: qty 250

## 2020-07-11 NOTE — Care Management Obs Status (Signed)
[facility] NOTIFICATION   Patient Details  Name: Deshanda Molitor MRN: [id number] Date of Birth: 1946-03-07   Medicare Observation Status Notification Given:  Yes    Bethena Roys, RN 07/11/2020, 5:21 PM

## 2020-07-11 NOTE — Progress Notes (Signed)
SLP Cancellation Note  Patient Details Name: Avalynn Bowe MRN: [id number] DOB: 02-Jan-1946   Cancelled treatment:       Reason Eval/Treat Not Completed: Other (comment) Order received for swallowing evaluation including barium swallow (esophagram), but this is a study completed in radiology without SLP. Per review of chart, the concern primarily seems to be for potential esophageal issues given her hx. SLP reached out to MD for clarification, who says that they are not wanting an oropharyngeal evaluation at this time. SLP will therefore sign off at this time. Please reorder if any oropharyngeal swallowing evaluation is wanted.     Osie Bond., M.A. [facility] Pager [phone] Office [phone]  07/11/2020, 9:23 AM

## 2020-07-11 NOTE — Assessment & Plan Note (Signed)
Appreciated on physical exam, confirmed with EKG. Radial pulse difficult to palpate due to pill rolling tremor from known Parkinson's disease. No previously documented A. Fib in chart. Sent to hospital for direct admission.

## 2020-07-11 NOTE — Progress Notes (Signed)
Family Medicine Teaching Service Daily Progress Note Intern Pager: [phone]  [name] Medical record number: [id number] Date of birth: [birth year] Age: 75 y.o. Gender: female  Primary Care Provider: [name], MD Consultants: Cardiology  Code Status: Full   Pt Overview and Major Events to Date:  3/29 Admitted   Assessment and Plan: [name] is a 75 y.o. female presenting with chest pain, found to be in new onset Atrial fibrillation with RVR. PMH is significant for hypertension, HFpEF, pulmonary hypertension, DM2, hyperlipidemia, CKD stage III, Parkinson's disease  New onset A. Fib w/ RVR  Chest pain  Hx of HFpEF Prior cardiac studies include a Lexiscan on 09/2018 without signs of ischemia, Cardiac MRI suboptimal but with concern for possible HOCM, echo on 09/2019 normal LVEF and G2 DD with moderate pulmonary HTN. ChadsVAsc score of 6, will need anticoagulation. Patient predisposed to Afib with her known structural heart disease, HTN, HLD, and age.  Received Dilt IV yesterday and then transitioned to Metoporolol 25mg  BID. This morning denies CP, SOB, palpitations. EKG this morning showed a fib with RVR, possible anterolateral ischemia with rate of 107bpm.  - Cardiology consulted, appreciate recommendations (may transition to metoprolol q6 instead of dilt drip)   - would like barium swallow due to history of dysphagia and esophageal pathology   - TEE if barium swallow normal   - Change from Lovenox to Eliquis 5mg  BID   - Metoprolol 25mg  q6h for rate control  - Troponins flat- stop trending  - Continuous cardiac telemetry - LR 75 ml/hr overnight   Recent fall with head injury, no LOC Patient reported fall on 3/28 while getting out of the bathtub with backwards fall onto buttocks (no remaining discomfort) and reported that she hit her head.  - CT head- wnl  -PT/OT -Encourage patient to use assistive device more regularly  Hypertension, stable   Blood pressure on admission 113/88, currently 102/69. Home med: Metoprolol 75mg  daily. - Continue metoprolol 25mg  q 6h  - Continue to monitor BP  Hypokalemia Potassium 3.3.  - Replete with 31mEq - BMP in the AM  Dehydration 2/2 Vomiting  AKI on CKD stage IIIB Patient reports recent vomiting with decreased oral intake and decreased urinary frequency.  CR of 1.62>1.45, GFR 33>38. Baseline Cr appears to be around 1.2-1.3. UA also showing dehydration and patient endorses decreased urinary frequency likely due to dehydration.  - IVF LR @75mL /h - Monitor creatinine on BMP daily  Parkinson's disease  Parkinson's related dysphagia On exam patient with significant resting tremors. Home med: Carbidopa-levodopa.  - Continue home Carbidopa-Levodopa five times daily  -F/u barium swallow   Type 2 diabetes Last HbA1c on 07/14/2019 was 5.7. Patient is currently diet controlled. Glucose this am 98 - F/u HbA1c - Monitor BMP for glucose, consider adding on SSI if persistent hyperglycemia  Anxiety  Home med: Zoloft 100mg  daily  - Continue home zoloft   FEN/GI: LR @ 56mL/h; heart healthy diet, NPO at MN Prophylaxis: Will start full dose lovenox after CT scan if negative  Disposition: Cardiac-tele   Subjective:  Patient resting comfortably in bed. Denies CP, SOB, palpitations. States she is feeling better today, just anxious about her procedure. Said she talked to her family and feels better. Denied any complaints   Objective: Temp:  [98 F (36.7 C)-98.7 F (37.1 C)] 98 F (36.7 C) (03/30 0403) Pulse Rate:  [80-112] 80 (03/30 0403) Resp:  [16-20] 20 (03/30 0403) BP: (91-128)/(56-95) 102/69 (03/30 0403) SpO2:  [  95 %-97 %] 96 % (03/30 0403) Weight:  [74.7 kg-75.8 kg] 74.7 kg (03/30 0403) Physical Exam: General: alert, laying in bed, NAD Cardiovascular: irrgular rate and rhythm. No murmurs  Respiratory: CTAB. Normal WOB  Abdomen: soft, non distended, non tender  Extremities: warm,  dry. No LE edema   Laboratory: Recent Labs  Lab 07/10/20 1828 07/11/20 0406  WBC 8.0 6.4  HGB 13.2 12.2  HCT 38.7 35.5*  PLT 237 212   Recent Labs  Lab 07/10/20 1828 07/11/20 0406  NA 139 140  K 3.4* 3.3*  CL 105 105  CO2 27 28  BUN 22 19  CREATININE 1.62* 1.45*  CALCIUM 8.6* 8.7*  PROT 6.0*  --   BILITOT 0.6  --   ALKPHOS 60  --   ALT 7  --   AST 28  --   GLUCOSE 112* 98    Imaging/Diagnostic Tests: CT HEAD WO CONTRAST  Result Date: 07/11/2020 CLINICAL DATA:  Un witnessed fall with headaches and dizziness EXAM: CT HEAD WITHOUT CONTRAST TECHNIQUE: Contiguous axial images were obtained from the base of the skull through the vertex without intravenous contrast. COMPARISON:  12/06/2013 FINDINGS: Brain: Mild atrophic changes and chronic white matter ischemic changes are noted. The white matter changes have progressed in the interval from the prior exam. No findings to suggest acute hemorrhage, acute infarction or space-occupying mass lesion are seen. Vascular: No hyperdense vessel or unexpected calcification. Skull: Normal. Negative for fracture or focal lesion. Sinuses/Orbits: No acute finding. Other: None. IMPRESSION: Chronic changes without acute abnormality. Electronically Signed   By: Inez Catalina M.D.   On: 07/11/2020 00:03    Shary Key, DO 07/11/2020, 7:29 AM PGY-1, [facility] Intern pager: [phone], text pages welcome

## 2020-07-11 NOTE — Progress Notes (Addendum)
[name] for Apixaban Indication: atrial fibrillation  Allergies  Allergen Reactions  . Ace Inhibitors Anaphylaxis and Swelling  . Azilect [Rasagiline Mesylate] Other (See Comments)    hypotension  . Lisinopril Swelling    Severe facial angioedema requiring hospitalization 2007 (approx)    Patient Measurements: Height: 5\' 4"  (162.6 cm) Weight: 74.7 kg (164 lb 11.2 oz) IBW/kg (Calculated) : 54.7  Heparin DW: 70.3 kg  Vital Signs: Temp: 98 F (36.7 C) (03/30 0403) Temp Source: Oral (03/30 0403) BP: 102/69 (03/30 0403) Pulse Rate: 80 (03/30 0403)  Labs: Recent Labs    07/10/20 1828 07/10/20 2122 07/11/20 0406  HGB 13.2  --  12.2  HCT 38.7  --  35.5*  PLT 237  --  212  CREATININE 1.62*  --  1.45*  TROPONINIHS 135* 87*  --     Estimated Creatinine Clearance: 33.2 mL/min (A) (by C-G formula based on SCr of 1.45 mg/dL (H)).   Medical History: Past Medical History:  Diagnosis Date  . Abdominal pain, chronic, right upper quadrant 09/22/2019  . Acute esophagitis   . Allergy   . Anxiety   . Arthritis    "knees, back" (06/29/2014)  . ARTHRITIS, BACK 03/28/2009   Qualifier: Diagnosis of  By: Nori Riis MD, [name]    . CARPAL TUNNEL SYNDROME, LEFT 01/05/2008   Qualifier: Diagnosis of  By: Nori Riis MD, [name]    . Cataract   . Chronic bronchitis ([place])    "get it q yr"  . Chronic mid back pain   . Cognitive impairment 12/11/2013  . Complication of anesthesia 07/2008   "hard to get me woke up when I had my knee replaced; they said they had to bring me back"  . Dementia ([place])    "I have some; not dx'd by a dr" (08/06/2017)  . Diabetes mellitus type 2, diet-controlled ([place]) 08/05/2006       . Disorder of bursae and tendons in shoulder region 01/24/2009   Qualifier: Diagnosis of  By: Nori Riis MD, [name]    . Esophageal abnormality 08/12/2010   Barium swallow 12/2011  IMPRESSION: Esophageal dysmotility.  No fixed esophageal narrowing or stricture. However,  the barium tablet was transiently delayed at the GE junction.  Postsurgical changes at the GE junction. Gastroesophageal reflux is suspected, but could not be confirmed due to the patient's inability to clear her esophagus in the prone position.    . Esophageal stricture   . GERD (gastroesophageal reflux disease)   . Greater trochanteric bursitis of right hip 07/14/2019  . Hematemesis 08/04/2017  . Hiatal hernia   . History of gout 1970's  . Hyperlipidemia   . Hypertension   . Major depressive disorder, recurrent episode ([place]) 06/11/2006   Qualifier: Diagnosis of  By: Beryle Lathe    . Movement disorder   . Neuromuscular disorder ([place] )   . Osteoarthritis of multiple joints 08/04/2006   S/p R TKR Significant OA continues to be a problem in her left knee. Lower back OA Complicated by her parkinsonism    . Parkinson's disease ([place])   . Pulmonary HTN ([place])    moderate with PASP 19mmHg on echo 09/2019  . 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 SCOLIOSIS 03/28/2009   Qualifier: Diagnosis of  By: Nori Riis MD, [name]    . Situational depression   . Substance abuse ([place])   . Tinnitus 02/15/2015   Unclear what this is from. Likely multifactorial.   . Type II diabetes mellitus (  HCC)   . Ulcerative esophagitis   . Urticaria, idiopathic 12/21/2013   One month of urticarial rash that is consistent with hives. She also has a lot of dry skin.   Marland Kitchen UTI (lower urinary tract infection)     Medications:  Scheduled:  . carbidopa-levodopa  1 tablet Oral 5 X Daily  . fluticasone  2 spray Each Nare Daily  . gabapentin  600 mg Oral BID WC  . gabapentin  900 mg Oral QHS  . metoprolol tartrate  25 mg Oral Q6H  . pantoprazole  40 mg Oral Daily  . potassium chloride  30 mEq Oral BID  . sertraline  100 mg Oral Daily    Assessment: 75 y.o. female with a new diagnosis of atrial fibrillation. CHADS2-VAScis 6, high stroke risk. Patient give one dose of therapeutic enoxaparin dosing this morning 3/30  @0151 . This morning hemoglobin dropped mildly to 12.2 and platelets 212. No bleeding when examining patient with MD this morning, no reports of bleeding.   Pharmacy consulted to transition to apixaban. Patient meets age, weight and renal function criteria for standard afib dosing.   Goal of Therapy:  Monitor platelets by anticoagulation protocol: Yes   Plan:  Stop enoxaparin Start apixaban 5mg  twice daily Monitor for signs of bleeding, follow CBCs  Norina Buzzard, PharmD PGY1 Pharmacy Resident 07/11/2020 9:29 AM   ADDENDUM: Apixaban not started. Last dose of Lovenox 3/30 @0151 . Pharmacy consulted to start IV heparin to facilitate endoscopy. No bolus due to afib indication, and recent enoxaparin. Informed nursing of dose and timing to start.   At 1400, start IV heparin at 1100 units/hr  6 hour heparin level, daily CBC Monitor for signs and symptoms of bleeding  Post procedures consider transition to apixaban  Norina Buzzard, PharmD PGY1 Pharmacy Resident 07/11/2020 12:45 PM

## 2020-07-11 NOTE — Assessment & Plan Note (Signed)
Slowed speech, intermittent difficulty with word finding and enunciation. Pill rolling tremor of bilateral hands at rest. Appears acutely worsened today per Dr. Nori Riis, who is PCP. Sent to hospital for direct admission.

## 2020-07-11 NOTE — Consult Note (Addendum)
[facility] Consult: 12:17 PM 07/11/2020  LOS: 0 days    Referring Provider: Dr Fransico Him Primary Care Physician:  [name], MD Primary Gastroenterologist:  Dr. Hilarie Fredrickson    Reason for Consultation: Dysphagia.   HPI: [name] is a 75 y.o. female.  PMH chronic chest pain.,  Possible HOCM on studies performed in 2020.  Pulmonary hypertension, normal LVEF and grade 2 DD on echocardiogram 09/2019.  Hypertension.  Parkinson's disease.  DM 2, diet controlled.  Anxiety.  Cholecystectomy.  Diffuse hepatic steatosis on CTAP w/o contrast in 2012.  07/2017 EGD: severe ulcerative esophagitis (source of CGE).  4 cm HH.  No stricture.   10/2017 EGD: LA grade B, nonbleeding esophagitis previous esophageal ulceration well-healed and overall significantly improved esophagitis.  3 to 4 cm hiatal hernia.  Normal stomach and examined duodenum.  Path from distal esophagus showed mild inflammation c/w GER.  No goblet cell metaplasia, dysplasia, malignancy. 07/08/2019 colonoscopy, for screeening and eval diarrhea w aleternating bowel habits.  Perianal fungal rash.  Multiple (7), sessile polyps removed from the ascending, transverse, descending and sigmoid, size 3 to 7 mm.  Left-sided diverticulosis with associated luminal narrowing.  Pathology on polyps: tubular adenomas w/o HGD.  Random colon biopsies showed no inflammation or microscopic colitis.  Compliant with omeprazole 40 mg bid at home.  For at least a few months patient's been having intermittent solid, liquid, pill dysphagia.  She especially has problem with breads and meats.  She regurgitates.  Also gets nauseous and vomits.  Feels the food getting stuck at the upper levels of the esophagus.  However she says that she is intermittently able to eat solid food and  drink liquids which the meal before, the day before or the subsequent day causes problems so the problem is intermittent.  She estimates she has trouble swallowing about 40% of the time.  Patient also has ongoing loose stools which are responsive to Imodium and bismuth.  Patient sustained a fall and hit her head after trying to get out of the bathtub on 3/28.  She complained of increased dizziness and mild confusion since the fall.  Head CT showed progression in chronic white matter ischemic changes but no acute hemorrhage, CVA or signs of trauma. Admitted 2 days ago with new onset A. fib, RVR.  CHA2DS2-VASc score 6 indicative of high risk for stroke.  Soft blood pressures are limiting rate control medications.  Current heart rate in the low 90s to low 100s.  07/11/2020 barium esophagram: Body limited due to patient unable to stand and her limited mobility.  Narrowing at the distal esophagus and GE junction.  13 mm tablet never passed despite prolonged observation, findings suspicious for stricture.  Patulous esophagus.  Moderate intermittent esophageal dysmotility.  Moderate GE reflux to the level of the mid and upper esophagus.  Small, sliding HH.  Cardiology is contemplating TEE/DCCV but would like upper endoscopy to assess the esophagus before proceeding with this.  Patient lives with her husband at home.  Past Medical History:  Diagnosis Date  .  Abdominal pain, chronic, right upper quadrant 09/22/2019  . Acute esophagitis   . Allergy   . Anxiety   . Arthritis    "knees, back" (06/29/2014)  . ARTHRITIS, BACK 03/28/2009   Qualifier: Diagnosis of  By: Nori Riis MD, [name]    . CARPAL TUNNEL SYNDROME, LEFT 01/05/2008   Qualifier: Diagnosis of  By: Nori Riis MD, [name]    . Cataract   . Chronic bronchitis ([place])    "get it q yr"  . Chronic mid back pain   . Cognitive impairment 12/11/2013  . Complication of anesthesia 07/2008   "hard to get me woke up when I had my knee replaced; they said they had to bring  me back"  . Dementia ([place])    "I have some; not dx'd by a dr" (08/06/2017)  . Diabetes mellitus type 2, diet-controlled ([place]) 08/05/2006       . Disorder of bursae and tendons in shoulder region 01/24/2009   Qualifier: Diagnosis of  By: Nori Riis MD, [name]    . Esophageal abnormality 08/12/2010   Barium swallow 12/2011  IMPRESSION: Esophageal dysmotility.  No fixed esophageal narrowing or stricture. However, the barium tablet was transiently delayed at the GE junction.  Postsurgical changes at the GE junction. Gastroesophageal reflux is suspected, but could not be confirmed due to the patient's inability to clear her esophagus in the prone position.    . Esophageal stricture   . GERD (gastroesophageal reflux disease)   . Greater trochanteric bursitis of right hip 07/14/2019  . Hematemesis 08/04/2017  . Hiatal hernia   . History of gout 1970's  . Hyperlipidemia   . Hypertension   . Major depressive disorder, recurrent episode ([place]) 06/11/2006   Qualifier: Diagnosis of  By: Beryle Lathe    . Movement disorder   . Neuromuscular disorder ([place])   . Osteoarthritis of multiple joints 08/04/2006   S/p R TKR Significant OA continues to be a problem in her left knee. Lower back OA Complicated by her parkinsonism    . Parkinson's disease ([place])   . Pulmonary HTN ([place])    moderate with PASP 59mmHg on echo 09/2019  . 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 SCOLIOSIS 03/28/2009   Qualifier: Diagnosis of  By: Nori Riis MD, [name]    . Situational depression   . Substance abuse ([place])   . Tinnitus 02/15/2015   Unclear what this is from. Likely multifactorial.   . Type II diabetes mellitus ([place])   . Ulcerative esophagitis   . Urticaria, idiopathic 12/21/2013   One month of urticarial rash that is consistent with hives. She also has a lot of dry skin.   Marland Kitchen UTI (lower urinary tract infection)     Past Surgical History:  Procedure Laterality Date  . ABDOMINAL HYSTERECTOMY    . CHOLECYSTECTOMY OPEN    .  COLON SURGERY    . COLONOSCOPY  07/08/2019  . DILATION AND CURETTAGE OF UTERUS    . ESOPHAGOGASTRIC FUNDOPLASTY     some type "esoph surgery" per pt  . ESOPHAGOGASTRODUODENOSCOPY (EGD) WITH PROPOFOL N/A 08/05/2017   Procedure: ESOPHAGOGASTRODUODENOSCOPY (EGD) WITH PROPOFOL;  Surgeon: Jerene Bears, MD;  Location: [facility];  Service: Gastroenterology;  Laterality: N/A;  . JOINT REPLACEMENT    . TOTAL KNEE ARTHROPLASTY Right 07/2008  . TUBAL LIGATION      Prior to Admission medications   Medication Sig Start Date End Date Taking? Authorizing Provider  carbidopa-levodopa (SINEMET IR) 25-100 MG tablet Take 1 tablet by mouth  5 (five) times daily. 02/07/19  Yes Penumalli, Earlean Polka, MD  gabapentin (NEURONTIN) 300 MG capsule Take 300-600 mg by mouth See admin instructions. Take  2 capsules in the morning and 2 capsules at lunch 3 capsule in the evening as needed for pain 07/10/16  Yes Plovsky, Berneta Sages, MD  loperamide (IMODIUM A-D) 2 MG tablet Take 2 mg by mouth 4 (four) times daily as needed for diarrhea or loose stools.   Yes [provider]  metoprolol succinate (TOPROL-XL) 50 MG 24 hr tablet Take 1.5 tablets (75 mg total) by mouth daily. 01/09/20 01/08/21 Yes Turner, Eber Hong, MD  omeprazole (PRILOSEC) 40 MG capsule TAKE 1 CAPSULE BY MOUTH TWICE DAILY Patient taking differently: Take 40 mg by mouth in the morning and at bedtime. 08/24/19  Yes [name], MD  sertraline (ZOLOFT) 100 MG tablet Take 200 mg by mouth daily. Per psych 12/29/13  Yes [provider]  traMADol (ULTRAM) 50 MG tablet TAKE 1 TO 2 TABLETS BY MOUTH TWICE DAILY AS NEEDED FOR CHRONIC KNEE PAIN Patient taking differently: Take 50-100 mg by mouth 2 (two) times daily as needed (chronic knee pain). 02/14/20  Yes [name], MD  nystatin-triamcinolone ointment [facility]) Apply 1 application topically 3 (three) times daily. 07/08/19   Pyrtle, Lajuan Lines, MD  ondansetron (ZOFRAN) 4 MG tablet Take 1 tablet (4 mg total) by mouth  every 8 (eight) hours as needed for nausea or vomiting. 07/10/20   Ezequiel Essex, MD    Scheduled Meds: . carbidopa-levodopa  1 tablet Oral 5 X Daily  . fluticasone  2 spray Each Nare Daily  . gabapentin  200 mg Oral BID WC  . gabapentin  400 mg Oral QHS  . metoprolol tartrate  25 mg Oral Q6H  . pantoprazole  40 mg Oral Daily  . potassium chloride  40 mEq Oral BID  . sertraline  100 mg Oral Daily   Infusions: . lactated ringers 75 mL/hr at 07/11/20 1114   PRN Meds: acetaminophen   Allergies as of 07/10/2020 - Review Complete 07/10/2020  Allergen Reaction Noted  . Ace inhibitors Anaphylaxis and Swelling 03/11/2012  . Azilect [rasagiline mesylate] Other (See Comments) 09/26/2010  . Lisinopril Swelling 05/21/2005    Family History  Problem Relation Age of Onset  . Heart disease Mother   . Diabetes Mother   . Heart disease Father   . Heart attack Sister   . Heart disease Sister   . Heart attack Brother   . Heart disease Brother   . Cerebral palsy Son   . Colon cancer Neg Hx   . Esophageal cancer Neg Hx   . Liver cancer Neg Hx   . Pancreatic cancer Neg Hx   . Stomach cancer Neg Hx   . Rectal cancer Neg Hx     Social History   Socioeconomic History  . Marital status: Married    Spouse name: [name]  . Number of children: 2  . Years of education: 76  . Highest education level: Not on file  Occupational History  . Occupation: Retired    Fish farm manager: NOT EMPLOYED  Tobacco Use  . Smoking status: Never Smoker  . Smokeless tobacco: Never Used  Vaping Use  . Vaping Use: Never used  Substance and Sexual Activity  . Alcohol use: Yes    Comment: Occasional  . Drug use: No  . Sexual activity: Not on file  Other Topics Concern  . Not on file  Social History Narrative  Health Care POA:    Emergency Contact: daughter, [name] [phone] husband, [name] [phone]   End of Life Plan:   Who lives with you: Lives with husband 02/07/19   Any pets: Rabbit, [name]    Diet: Patient has a varied diet but struggles with what to eat with hypertension, diabetes, parkinsons   Exercise: Patient does not have any regular exercise routine.   Seatbelts: Patient reports wearing her seatbelt when in vehicle.   Nancy Fetter Exposure/Protection: Patient reports wearing sun block lotion daily.   Hobbies: Watching game shows, writing poetry, writing in journal, volunteering at day program with son.    Caffeine Use: very little occasional      02/07/19 caregiver 2 hrs 2-3 x weekly   Social Determinants of Health   Financial Resource Strain: Not on file  Food Insecurity: Not on file  Transportation Needs: Not on file  Physical Activity: Not on file  Stress: Not on file  Social Connections: Not on file  Intimate Partner Violence: Not on file    REVIEW OF SYSTEMS: Constitutional: Fatigue, weakness. ENT:  No nose bleeds Pulm: Shortness of breath, nonproductive cough. CV:  No palpitations, no LE edema.  Chest pain, nonexertional GU:  No hematuria, no frequency GI: See HPI. Heme: Denies excessive or unusual bleeding or bruising. Transfusions: None. Neuro:  No headaches, no peripheral tingling or numbness.  No seizures, no syncope.  Tremors associated with her Parkinson's. Derm:  No itching, no rash or sores.  Endocrine:  No sweats or chills.  No polyuria or dysuria Immunization: Reviewed.  Has been vaccinated and boosted with [place] Travel:  None beyond local counties in last few months.    PHYSICAL EXAM: Vital signs in last 24 hours: Vitals:   07/11/20 0800 07/11/20 1100  BP:  (!) 107/97  Pulse: 85 100  Resp:  18  Temp:    SpO2: 97% 100%   Wt Readings from Last 3 Encounters:  07/11/20 74.7 kg  07/10/20 75.8 kg  05/17/20 79.1 kg    General: Frail, looks chronically ill.  Alert but delayed responses to questions. Head: No signs of head trauma.  No facial asymmetry Eyes: No conjunctival pallor or scleral icterus.  Post cataract appearance to  the pupils. Ears: Slight HOH Nose: No congestion or discharge Mouth: Partial dentures in place.  Mucosa is moist, pink, clear.  Tongue midline Neck: No JVD, no masses, no thyromegaly Lungs: Clear bilaterally without labored breathing or cough Heart: Irregularly irregular.  Rate fluctuating in the mid 90s to low 100s. Abdomen: Soft without tenderness.  Active bowel sounds.  No HSM, masses, bruits, hernias.   Rectal: Deferred Musc/Skeltl: No joint redness, swelling or gross deformity. Extremities: No CCE. Neurologic: Pill-rolling tremor in the hands.  Involuntary slow jerking of the arms.  Moves all 4 limbs, strength not tested.  Delayed responses to questions but able to answer these fairly accurately Skin: No significant purpura or bruising.  No open sores.  No rash Nodes: No cervical adenopathy Psych: Calm, cooperative.  Intake/Output from previous day: 03/29 0701 - 03/30 0700 In: 695 [P.O.:240; I.V.:455] Out: 200 [Urine:200] Intake/Output this shift: No intake/output data recorded.  LAB RESULTS: Recent Labs    07/10/20 1828 07/11/20 0406  WBC 8.0 6.4  HGB 13.2 12.2  HCT 38.7 35.5*  PLT 237 212   BMET Lab Results  Component Value Date   NA 140 07/11/2020   NA 139 07/10/2020   NA 143 04/20/2019   K 3.3 (L) 07/11/2020  K 3.4 (L) 07/10/2020   K 3.9 04/20/2019   CL 105 07/11/2020   CL 105 07/10/2020   CL 105 04/20/2019   CO2 28 07/11/2020   CO2 27 07/10/2020   CO2 24 04/20/2019   GLUCOSE 98 07/11/2020   GLUCOSE 112 (H) 07/10/2020   GLUCOSE 103 (H) 04/20/2019   BUN 19 07/11/2020   BUN 22 07/10/2020   BUN 21 04/20/2019   CREATININE 1.45 (H) 07/11/2020   CREATININE 1.62 (H) 07/10/2020   CREATININE 1.35 (H) 04/20/2019   CALCIUM 8.7 (L) 07/11/2020   CALCIUM 8.6 (L) 07/10/2020   CALCIUM 10.0 04/20/2019   LFT Recent Labs    07/10/20 1828  PROT 6.0*  ALBUMIN 3.6  AST 28  ALT 7  ALKPHOS 60  BILITOT 0.6   PT/INR Lab Results  Component Value Date   INR  0.95 08/06/2017   INR 1.04 08/05/2017   INR 0.97 08/04/2017   Hepatitis Panel No results for input(s): HEPBSAG, HCVAB, HEPAIGM, HEPBIGM in the last 72 hours. C-Diff No components found for: CDIFF Lipase     Component Value Date/Time   LIPASE 31 08/20/2017 1224    Drugs of Abuse     Component Value Date/Time   LABOPIA NEG 03/28/2015 1005   COCAINSCRNUR NEG 03/28/2015 1005   LABBENZ PPS 03/28/2015 1005   AMPHETMU NEG 03/28/2015 1005   AMPHETMU NONE DETECTED 12/10/2013 1600   THCU 21 (H) 03/28/2015 1005   THCU NONE DETECTED 12/10/2013 1600   LABBARB NONE DETECTED 12/10/2013 1600     RADIOLOGY STUDIES: CT HEAD WO CONTRAST  Result Date: 07/11/2020 CLINICAL DATA:  Un witnessed fall with headaches and dizziness EXAM: CT HEAD WITHOUT CONTRAST TECHNIQUE: Contiguous axial images were obtained from the base of the skull through the vertex without intravenous contrast. COMPARISON:  12/06/2013 FINDINGS: Brain: Mild atrophic changes and chronic white matter ischemic changes are noted. The white matter changes have progressed in the interval from the prior exam. No findings to suggest acute hemorrhage, acute infarction or space-occupying mass lesion are seen. Vascular: No hyperdense vessel or unexpected calcification. Skull: Normal. Negative for fracture or focal lesion. Sinuses/Orbits: No acute finding. Other: None. IMPRESSION: Chronic changes without acute abnormality. Electronically Signed   By: Inez Catalina M.D.   On: 07/11/2020 00:03   DG ESOPHAGUS W SINGLE CM (SOL OR THIN BA)  Result Date: 07/11/2020 CLINICAL DATA:  Esophageal stricture. Additional history provided: Patient reports dysphagia with food and liquids. EXAM: ESOPHOGRAM / BARIUM SWALLOW / BARIUM TABLET STUDY TECHNIQUE: Single contrast examination performed using thick and thin barium liquid. The patient was observed with fluoroscopy swallowing a 13 mm barium sulphate tablet. FLUOROSCOPY TIME:  Fluoroscopy Time:  2 minutes, 48  seconds Radiation Exposure Index (if provided by the fluoroscopic device): 70.70 mGy Number of Acquired Spot Images: 1 COMPARISON:  Prior chest radiographs 09/29/2018 and earlier. Chest CT 08/31/2006. barium esophagram 12/19/2011. FINDINGS: The examination is somewhat limited due to the patient's inability to stand and limited patient mobility. The esophagus is patulous. However, there is a somewhat narrowed appearance at the level of the distal esophagus and GE junction. Additionally, a 13 mm barium tablet did not pass from the distal esophagus into the stomach despite a prolonged period of observation. Small sliding hiatal hernia. Moderate intermittent esophageal dysmotility. Moderate volume gastroesophageal reflux observed to the level of the mid/upper esophagus. IMPRESSION: Somewhat limited examination due to the patient's inability to stand and limited patient mobility. There is a narrowed appearance at the level  of the distal esophagus and GE junction. Additionally, a 13 mm barium tablet did not pass from the distal esophagus into the stomach, despite a prolonged period of observation. Findings are suspicious for a stricture at this site, and endoscopy is recommended for further evaluation. Otherwise patulous esophagus. Moderate intermittent esophageal dysmotility. Moderate-volume gastroesophageal reflux observed to the level of the mid/upper esophagus. Small sliding hiatal hernia. Electronically Signed   By: Kellie Simmering DO   On: 07/11/2020 10:10     IMPRESSION:   *   New onset A. fib with RVR.  On heparin. TEE/DCCV on hold until esophagus is better evaluated.  *    Dysphagia.   Severe esophagitis had improved on serial endoscopies in 2019.  Did not have stricturing on either of these scopes. Parkinson's likely contributing but the esophagram does suggest distal esophageal stenosis and confirms ongoing esophageal reflux and esophageal dysmotility. Home meds include omeprazole 40 mg bid  *     Hypokalemia.  *     AKI.  GFR 38.  Looks to have baseline CKD stage 3b.    *   Parkinson's disease.  Significant upper extremity tremors, some dysarthria observed on exam.    PLAN:     *    Allow full liquid diet for now. Dr. Lyndel Safe will see patient, may pursue EGD with possible esophageal dilatation tomorrow but she will need to be off heparin for 4 hours prior to this.   Azucena Freed  07/11/2020, 12:17 PM Phone [phone]   Attending physician's note   I have taken an interval history, reviewed the chart and examined the patient. I agree with the Advanced Practitioner's note, impression and recommendations.   Eso dysphagia with Ba swallow showing distal eso stricture with HH and eso dysmotility.  H/O erosive esophagitis with HH on EGD 2019. Has some component of oropharyngeal dysphagia d/t Parkinson's.  New onset A. Fib on heparin.  Plan: -EGD with dil tomorrow at 11 AM.  Stop heparin 4 hours before. -Discussed risks and benefits with pt and pt's husband. -Long-term PPIs-Protonix 40 mg p.o. once a day for now. -Nonpharmacologic means of reflux control and aspiration precautions. -D/W nursing staff.    Carmell Austria, MD [facility] [phone]

## 2020-07-11 NOTE — Progress Notes (Signed)
CT Head without evidence of acute intracranial abnormality, specifically no concern for bleed. Hemoglobin WNL.   Will start treatment dose Lovenox (1 mg/kg BID). Possible TEE/DCCV tomorrow.   Patriciaann Clan, DO

## 2020-07-11 NOTE — Progress Notes (Signed)
[facility] for Apixaban Indication: atrial fibrillation  Allergies  Allergen Reactions  . Ace Inhibitors Anaphylaxis and Swelling  . Azilect [Rasagiline Mesylate] Other (See Comments)    hypotension  . Lisinopril Swelling    Severe facial angioedema requiring hospitalization 2007 (approx)    Patient Measurements: Height: 5\' 4"  (162.6 cm) Weight: 74.7 kg (164 lb 11.2 oz) IBW/kg (Calculated) : 54.7  Heparin DW: 70.3 kg  Vital Signs: Temp: 97.9 F (36.6 C) (03/30 2034) Temp Source: Oral (03/30 2034) BP: 126/71 (03/30 2034) Pulse Rate: 77 (03/30 1629)  Labs: Recent Labs    07/10/20 1828 07/10/20 2122 07/11/20 0406 07/11/20 2034  HGB 13.2  --  12.2  --   HCT 38.7  --  35.5*  --   PLT 237  --  212  --   HEPARINUNFRC  --   --   --  0.56  CREATININE 1.62*  --  1.45*  --   TROPONINIHS 135* 87*  --   --     Estimated Creatinine Clearance: 33.2 mL/min (A) (by C-G formula based on SCr of 1.45 mg/dL (H)).   Medical History: Past Medical History:  Diagnosis Date  . Abdominal pain, chronic, right upper quadrant 09/22/2019  . Acute esophagitis   . Allergy   . Anxiety   . Arthritis    "knees, back" (06/29/2014)  . ARTHRITIS, BACK 03/28/2009   Qualifier: Diagnosis of  By: Nori Riis MD, [name]    . CARPAL TUNNEL SYNDROME, LEFT 01/05/2008   Qualifier: Diagnosis of  By: Nori Riis MD, [name]    . Cataract   . Chronic bronchitis ([place])    "get it q yr"  . Chronic mid back pain   . Cognitive impairment 12/11/2013  . Complication of anesthesia 07/2008   "hard to get me woke up when I had my knee replaced; they said they had to bring me back"  . Dementia ([place])    "I have some; not dx'd by a dr" (08/06/2017)  . Diabetes mellitus type 2, diet-controlled ([place]) 08/05/2006       . Disorder of bursae and tendons in shoulder region 01/24/2009   Qualifier: Diagnosis of  By: Nori Riis MD, [name]    . Esophageal abnormality 08/12/2010   Barium swallow 12/2011  IMPRESSION:  Esophageal dysmotility.  No fixed esophageal narrowing or stricture. However, the barium tablet was transiently delayed at the GE junction.  Postsurgical changes at the GE junction. Gastroesophageal reflux is suspected, but could not be confirmed due to the patient's inability to clear her esophagus in the prone position.    . Esophageal stricture   . GERD (gastroesophageal reflux disease)   . Greater trochanteric bursitis of right hip 07/14/2019  . Hematemesis 08/04/2017  . Hiatal hernia   . History of gout 1970's  . Hyperlipidemia   . Hypertension   . Major depressive disorder, recurrent episode ([place]) 06/11/2006   Qualifier: Diagnosis of  By: Beryle Lathe    . Movement disorder   . Neuromuscular disorder ([place])   . Osteoarthritis of multiple joints 08/04/2006   S/p R TKR Significant OA continues to be a problem in her left knee. Lower back OA Complicated by her parkinsonism    . Parkinson's disease ([place])   . Pulmonary HTN ([place])    moderate with PASP 68mmHg on echo 09/2019  . 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 SCOLIOSIS 03/28/2009   Qualifier: Diagnosis of  By: Nori Riis MD, [name]    .  Situational depression   . Substance abuse ([place])   . Tinnitus 02/15/2015   Unclear what this is from. Likely multifactorial.   . Type II diabetes mellitus ([place])   . Ulcerative esophagitis   . Urticaria, idiopathic 12/21/2013   One month of urticarial rash that is consistent with hives. She also has a lot of dry skin.   Marland Kitchen UTI (lower urinary tract infection)     Medications:  Scheduled:  . carbidopa-levodopa  1 tablet Oral 5 X Daily  . fluticasone  2 spray Each Nare Daily  . gabapentin  200 mg Oral BID WC  . gabapentin  400 mg Oral QHS  . metoprolol tartrate  25 mg Oral Q6H  . pantoprazole  40 mg Oral Daily  . sertraline  100 mg Oral Daily    Assessment: 75 y.o. female with a new diagnosis of atrial fibrillation. CHADS2-VAScis 6, high stroke risk. Patient give one dose of therapeutic  enoxaparin dosing this morning 3/30 @0151 . This morning hemoglobin dropped mildly to 12.2 and platelets 212. No bleeding when examining patient with MD this morning, no reports of bleeding.   Apixaban not started. Last dose of Lovenox 3/30 @0151 . Pharmacy consulted to start IV heparin to facilitate endoscopy. No bolus due to afib indication, and recent enoxaparin. Informed nursing of dose and timing to start.   IV heparin at 1100 units/hr with heparin level 0.56 at goal    Post procedures consider transition to apixaban  Goal of Therapy:  Heparin level 0.3-0.7 Monitor platelets by anticoagulation protocol: Yes   Plan:  Continue Heparin drip 1100 uts/hr  Daily Heparin  Level and CBC     Bonnita Nasuti Pharm.D. CPP, BCPS Clinical Pharmacist [phone] 07/11/2020 9:37 PM

## 2020-07-11 NOTE — Evaluation (Signed)
Occupational Therapy Evaluation Patient Details Name: [name] MRN: [id number] DOB: [birth year] Today's Date: 07/11/2020    History of Present Illness 75 y.o. female presenting as direct admit from family medicine appointment where she was found to be in Afib with RVR. Pt with reports of several weeks of chest pain and SOB. Pt also reports a recent fall with, endorses hitting her head, PMH is significant for hypertension, pulmonary hypertension, DM2, hyperlipidemia, CKD stage III, Parkinson's disease.   Clinical Impression   Pt admitted with Afib with RVR. Pt at this time reported they have [name] (husband ) at home at all times to assist them but he has had his own health issues and needs to get out of the home. Pt will have an aide on Tuesdays and Thursdays for 4 hours. Pt was able to complete bed mobility with use of bed rail, sitting to standing with min guard to RW, standing with BUE and seated BLE dressing with min guard with sitting EOB.  Pt currently with functional limitations due to the deficits listed below (see OT Problem List).  Pt will benefit from skilled OT to increase their safety and independence with ADL and functional mobility for ADL to facilitate discharge to venue listed below.       Follow Up Recommendations  Home health OT;Supervision - Intermittent    Equipment Recommendations       Recommendations for Other Services       Precautions / Restrictions Precautions Precautions: Fall Restrictions Weight Bearing Restrictions: No      Mobility Bed Mobility Overal bed mobility: Modified Independent (use of bed rail) Bed Mobility: Supine to Sit     Supine to sit: Supervision;HOB elevated          Transfers Overall transfer level: Needs assistance Equipment used: Rolling walker (2 wheeled) Transfers: Sit to/from Stand Sit to Stand: Min guard              Balance Overall balance assessment: Needs assistance Sitting-balance support: No  upper extremity supported;Feet supported Sitting balance-Leahy Scale: Good     Standing balance support: Single extremity supported;During functional activity Standing balance-Leahy Scale: Poor Standing balance comment: reliant on unilateral UE support                           ADL either performed or assessed with clinical judgement   ADL Overall ADL's : Needs assistance/impaired Eating/Feeding: Set up;Sitting   Grooming: Wash/dry hands;Wash/dry face;Min guard;Standing   Upper Body Bathing: Min guard;Cueing for safety;Cueing for sequencing;Sitting   Lower Body Bathing: Min guard;Cueing for safety;Cueing for sequencing;Sit to/from stand   Upper Body Dressing : Min guard;Sitting;Cueing for safety;Cueing for sequencing   Lower Body Dressing: Min guard;Sit to/from stand   Toilet Transfer: Min guard;Cueing for safety;Cueing for sequencing;RW   Toileting- Water quality scientist and Hygiene: Min guard;Cueing for safety;Cueing for sequencing;Sit to/from stand   Tub/ Banker: Minimal assistance     General ADL Comments: Pt at this time did not want to ambulate at this time as reported they are very tired     Vision         Perception     Praxis      Pertinent Vitals/Pain Pain Assessment: Faces Faces Pain Scale: Hurts a little bit Pain Location: general aching Pain Descriptors / Indicators: Aching Pain Intervention(s): Monitored during session     Hand Dominance     Extremity/Trunk Assessment Upper Extremity Assessment Upper Extremity Assessment: Generalized  weakness RUE Coordination: decreased gross motor;decreased fine motor LUE Coordination: decreased gross motor;decreased fine motor   Lower Extremity Assessment Lower Extremity Assessment: Defer to PT evaluation   Cervical / Trunk Assessment Cervical / Trunk Assessment: Kyphotic   Communication Communication Communication: No difficulties   Cognition Arousal/Alertness:  Awake/alert Behavior During Therapy: WFL for tasks assessed/performed Overall Cognitive Status: Within Functional Limits for tasks assessed                                     General Comments  pt tachy into 120s with mobility. BP in sitting noted to be elevated, 166/110, however this may be an inaccurate reading due to UE tremors    Exercises     Shoulder Instructions      Home Living Family/patient expects to be discharged to:: Private residence Living Arrangements: Spouse/significant other Available Help at Discharge: Family;Available 24 hours/day Type of Home: House Home Access: Stairs to enter [employer] of Steps: 4 Entrance Stairs-Rails: Can reach both Home Layout: One level     Bathroom Shower/Tub: Teacher, early years/pre: Standard Bathroom Accessibility: Yes   Home Equipment: Environmental consultant - 4 wheels;Tub bench   Additional Comments: Pt reports that they have an aide who comes on tuesday and thursday for 4 hours. Pt reports that they can not depend always on the husband because he has his own medical things going on right now      Prior Functioning/Environment Level of Independence: Needs assistance  Gait / Transfers Assistance Needed: pt reports ambulating with 4 wheeled walker in the community, typically without device in the home although she has been utilizing the 4 wheeled walker indoors recently due to falls. ADL's / Homemaking Assistance Needed: pt requries assistance for ADLs            OT Problem List: Decreased strength;Decreased activity tolerance;Impaired balance (sitting and/or standing)      OT Treatment/Interventions: Self-care/ADL training;Therapeutic exercise;Patient/family education;Balance training    OT Goals(Current goals can be found in the care plan section) Acute Rehab OT Goals Patient Stated Goal: to have some rehab to be able to take care of herself for small periods of time OT Goal Formulation: With  patient Time For Goal Achievement: 07/20/20 Potential to Achieve Goals: Good  OT Frequency: Min 2X/week   Barriers to D/C:            Co-evaluation              AM-PAC OT "6 Clicks" Daily Activity     Outcome Measure Help from another person eating meals?: None Help from another person taking care of personal grooming?: None Help from another person toileting, which includes using toliet, bedpan, or urinal?: None Help from another person bathing (including washing, rinsing, drying)?: A Little Help from another person to put on and taking off regular upper body clothing?: None Help from another person to put on and taking off regular lower body clothing?: None 6 Click Score: 23   End of Session Equipment Utilized During Treatment: Gait belt;Rolling walker Nurse Communication: Other (comment) (status of participation and family home life)  Activity Tolerance: Patient limited by fatigue (pt reported they did not want to do much at this time) Patient left: in bed;with call bell/phone within reach;with family/visitor present (husband [name] present)  OT Visit Diagnosis: Unsteadiness on feet (R26.81);Repeated falls (R29.6);Muscle weakness (generalized) (M62.81);History of falling (Z91.81)  Time: 3149-7026 OT Time Calculation (min): 21 min Charges:  OT General Charges $OT Visit: 1 Visit OT Evaluation $OT Eval Low Complexity: [place] OTR/L  Acute Rehab Services  [phone] office number [phone] pager number   Joeseph Amor 07/11/2020, 12:07 PM

## 2020-07-11 NOTE — Progress Notes (Signed)
abnormal barium swallow with Findings are suspicious for a stricture at this site, and endoscopy is recommended for further evaluation.  Moderate intermittent esophageal dysmotility.  Moderate-volume gastroesophageal reflux observed to the level of the mid/upper esophagus.  Small sliding hiatal hernia.  Discussed with Dr. Radford Pax, will have GI see her.   Discussed with pt

## 2020-07-11 NOTE — Assessment & Plan Note (Signed)
Acute-on-chronic injury.  AKI evident on CMP with Cr 1.62, up from baseline of 1.15-1.3. UA negative for infection but demonstrates concentrated urine with SG >1.030, trace intact blood, 30 protein, and trace ketones. Micro urine eval shows many bacteria, 5-10 epithelial cells, and 0-3 hyaline casts. UA microalbumin also elevated at 150, Cr 200, and albumin/creatinine ratio of 30-300.  - Direct admit to inpatient team

## 2020-07-11 NOTE — H&P (View-Only) (Signed)
[facility] Consult: 12:17 PM 07/11/2020  LOS: 0 days    Referring Provider: Dr Fransico Him Primary Care Physician:  [name], MD Primary Gastroenterologist:  Dr. Hilarie Fredrickson    Reason for Consultation: Dysphagia.   HPI: [name] is a 75 y.o. female.  PMH chronic chest pain.,  Possible HOCM on studies performed in 2020.  Pulmonary hypertension, normal LVEF and grade 2 DD on echocardiogram 09/2019.  Hypertension.  Parkinson's disease.  DM 2, diet controlled.  Anxiety.  Cholecystectomy.  Diffuse hepatic steatosis on CTAP w/o contrast in 2012.  07/2017 EGD: severe ulcerative esophagitis (source of CGE).  4 cm HH.  No stricture.   10/2017 EGD: LA grade B, nonbleeding esophagitis previous esophageal ulceration well-healed and overall significantly improved esophagitis.  3 to 4 cm hiatal hernia.  Normal stomach and examined duodenum.  Path from distal esophagus showed mild inflammation c/w GER.  No goblet cell metaplasia, dysplasia, malignancy. 07/08/2019 colonoscopy, for screeening and eval diarrhea w aleternating bowel habits.  Perianal fungal rash.  Multiple (7), sessile polyps removed from the ascending, transverse, descending and sigmoid, size 3 to 7 mm.  Left-sided diverticulosis with associated luminal narrowing.  Pathology on polyps: tubular adenomas w/o HGD.  Random colon biopsies showed no inflammation or microscopic colitis.  Compliant with omeprazole 40 mg bid at home.  For at least a few months patient's been having intermittent solid, liquid, pill dysphagia.  She especially has problem with breads and meats.  She regurgitates.  Also gets nauseous and vomits.  Feels the food getting stuck at the upper levels of the esophagus.  However she says that she is intermittently able to eat solid food and  drink liquids which the meal before, the day before or the subsequent day causes problems so the problem is intermittent.  She estimates she has trouble swallowing about 40% of the time.  Patient also has ongoing loose stools which are responsive to Imodium and bismuth.  Patient sustained a fall and hit her head after trying to get out of the bathtub on 3/28.  She complained of increased dizziness and mild confusion since the fall.  Head CT showed progression in chronic white matter ischemic changes but no acute hemorrhage, CVA or signs of trauma. Admitted 2 days ago with new onset A. fib, RVR.  CHA2DS2-VASc score 6 indicative of high risk for stroke.  Soft blood pressures are limiting rate control medications.  Current heart rate in the low 90s to low 100s.  07/11/2020 barium esophagram: Body limited due to patient unable to stand and her limited mobility.  Narrowing at the distal esophagus and GE junction.  13 mm tablet never passed despite prolonged observation, findings suspicious for stricture.  Patulous esophagus.  Moderate intermittent esophageal dysmotility.  Moderate GE reflux to the level of the mid and upper esophagus.  Small, sliding HH.  Cardiology is contemplating TEE/DCCV but would like upper endoscopy to assess the esophagus before proceeding with this.  Patient lives with her husband at home.  Past Medical History:  Diagnosis Date  .  Abdominal pain, chronic, right upper quadrant 09/22/2019  . Acute esophagitis   . Allergy   . Anxiety   . Arthritis    "knees, back" (06/29/2014)  . ARTHRITIS, BACK 03/28/2009   Qualifier: Diagnosis of  By: Nori Riis MD, [name]    . CARPAL TUNNEL SYNDROME, LEFT 01/05/2008   Qualifier: Diagnosis of  By: Nori Riis MD, [name]    . Cataract   . Chronic bronchitis ([place])    "get it q yr"  . Chronic mid back pain   . Cognitive impairment 12/11/2013  . Complication of anesthesia 07/2008   "hard to get me woke up when I had my knee replaced; they said they had to bring  me back"  . Dementia ([place])    "I have some; not dx'd by a dr" (08/06/2017)  . Diabetes mellitus type 2, diet-controlled ([place]) 08/05/2006       . Disorder of bursae and tendons in shoulder region 01/24/2009   Qualifier: Diagnosis of  By: Nori Riis MD, [name]    . Esophageal abnormality 08/12/2010   Barium swallow 12/2011  IMPRESSION: Esophageal dysmotility.  No fixed esophageal narrowing or stricture. However, the barium tablet was transiently delayed at the GE junction.  Postsurgical changes at the GE junction. Gastroesophageal reflux is suspected, but could not be confirmed due to the patient's inability to clear her esophagus in the prone position.    . Esophageal stricture   . GERD (gastroesophageal reflux disease)   . Greater trochanteric bursitis of right hip 07/14/2019  . Hematemesis 08/04/2017  . Hiatal hernia   . History of gout 1970's  . Hyperlipidemia   . Hypertension   . Major depressive disorder, recurrent episode ([place]) 06/11/2006   Qualifier: Diagnosis of  By: Beryle Lathe    . Movement disorder   . Neuromuscular disorder ([place])   . Osteoarthritis of multiple joints 08/04/2006   S/p R TKR Significant OA continues to be a problem in her left knee. Lower back OA Complicated by her parkinsonism    . Parkinson's disease ([place])   . Pulmonary HTN ([place])    moderate with PASP 64mmHg on echo 09/2019  . 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 SCOLIOSIS 03/28/2009   Qualifier: Diagnosis of  By: Nori Riis MD, [name]    . Situational depression   . Substance abuse ([place])   . Tinnitus 02/15/2015   Unclear what this is from. Likely multifactorial.   . Type II diabetes mellitus ([place])   . Ulcerative esophagitis   . Urticaria, idiopathic 12/21/2013   One month of urticarial rash that is consistent with hives. She also has a lot of dry skin.   Marland Kitchen UTI (lower urinary tract infection)     Past Surgical History:  Procedure Laterality Date  . ABDOMINAL HYSTERECTOMY    . CHOLECYSTECTOMY OPEN    .  COLON SURGERY    . COLONOSCOPY  07/08/2019  . DILATION AND CURETTAGE OF UTERUS    . ESOPHAGOGASTRIC FUNDOPLASTY     some type "esoph surgery" per pt  . ESOPHAGOGASTRODUODENOSCOPY (EGD) WITH PROPOFOL N/A 08/05/2017   Procedure: ESOPHAGOGASTRODUODENOSCOPY (EGD) WITH PROPOFOL;  Surgeon: Jerene Bears, MD;  Location: [facility];  Service: Gastroenterology;  Laterality: N/A;  . JOINT REPLACEMENT    . TOTAL KNEE ARTHROPLASTY Right 07/2008  . TUBAL LIGATION      Prior to Admission medications   Medication Sig Start Date End Date Taking? Authorizing Provider  carbidopa-levodopa (SINEMET IR) 25-100 MG tablet Take 1 tablet by mouth  5 (five) times daily. 02/07/19  Yes Penumalli, Earlean Polka, MD  gabapentin (NEURONTIN) 300 MG capsule Take 300-600 mg by mouth See admin instructions. Take  2 capsules in the morning and 2 capsules at lunch 3 capsule in the evening as needed for pain 07/10/16  Yes Plovsky, Berneta Sages, MD  loperamide (IMODIUM A-D) 2 MG tablet Take 2 mg by mouth 4 (four) times daily as needed for diarrhea or loose stools.   Yes [provider]  metoprolol succinate (TOPROL-XL) 50 MG 24 hr tablet Take 1.5 tablets (75 mg total) by mouth daily. 01/09/20 01/08/21 Yes Turner, Eber Hong, MD  omeprazole (PRILOSEC) 40 MG capsule TAKE 1 CAPSULE BY MOUTH TWICE DAILY Patient taking differently: Take 40 mg by mouth in the morning and at bedtime. 08/24/19  Yes [name], MD  sertraline (ZOLOFT) 100 MG tablet Take 200 mg by mouth daily. Per psych 12/29/13  Yes [provider]  traMADol (ULTRAM) 50 MG tablet TAKE 1 TO 2 TABLETS BY MOUTH TWICE DAILY AS NEEDED FOR CHRONIC KNEE PAIN Patient taking differently: Take 50-100 mg by mouth 2 (two) times daily as needed (chronic knee pain). 02/14/20  Yes [name], MD  nystatin-triamcinolone ointment [facility]) Apply 1 application topically 3 (three) times daily. 07/08/19   Pyrtle, Lajuan Lines, MD  ondansetron (ZOFRAN) 4 MG tablet Take 1 tablet (4 mg total) by mouth  every 8 (eight) hours as needed for nausea or vomiting. 07/10/20   Ezequiel Essex, MD    Scheduled Meds: . carbidopa-levodopa  1 tablet Oral 5 X Daily  . fluticasone  2 spray Each Nare Daily  . gabapentin  200 mg Oral BID WC  . gabapentin  400 mg Oral QHS  . metoprolol tartrate  25 mg Oral Q6H  . pantoprazole  40 mg Oral Daily  . potassium chloride  40 mEq Oral BID  . sertraline  100 mg Oral Daily   Infusions: . lactated ringers 75 mL/hr at 07/11/20 1114   PRN Meds: acetaminophen   Allergies as of 07/10/2020 - Review Complete 07/10/2020  Allergen Reaction Noted  . Ace inhibitors Anaphylaxis and Swelling 03/11/2012  . Azilect [rasagiline mesylate] Other (See Comments) 09/26/2010  . Lisinopril Swelling 05/21/2005    Family History  Problem Relation Age of Onset  . Heart disease Mother   . Diabetes Mother   . Heart disease Father   . Heart attack Sister   . Heart disease Sister   . Heart attack Brother   . Heart disease Brother   . Cerebral palsy Son   . Colon cancer Neg Hx   . Esophageal cancer Neg Hx   . Liver cancer Neg Hx   . Pancreatic cancer Neg Hx   . Stomach cancer Neg Hx   . Rectal cancer Neg Hx     Social History   Socioeconomic History  . Marital status: Married    Spouse name: [name]  . Number of children: 2  . Years of education: 19  . Highest education level: Not on file  Occupational History  . Occupation: Retired    Fish farm manager: NOT EMPLOYED  Tobacco Use  . Smoking status: Never Smoker  . Smokeless tobacco: Never Used  Vaping Use  . Vaping Use: Never used  Substance and Sexual Activity  . Alcohol use: Yes    Comment: Occasional  . Drug use: No  . Sexual activity: Not on file  Other Topics Concern  . Not on file  Social History Narrative  Health Care POA:    Emergency Contact: daughter, [name] [phone] husband, [name] [phone]   End of Life Plan:   Who lives with you: Lives with husband 02/07/19   Any pets: Rabbit, [name]    Diet: Patient has a varied diet but struggles with what to eat with hypertension, diabetes, parkinsons   Exercise: Patient does not have any regular exercise routine.   Seatbelts: Patient reports wearing her seatbelt when in vehicle.   Nancy Fetter Exposure/Protection: Patient reports wearing sun block lotion daily.   Hobbies: Watching game shows, writing poetry, writing in journal, volunteering at day program with son.    Caffeine Use: very little occasional      02/07/19 caregiver 2 hrs 2-3 x weekly   Social Determinants of Health   Financial Resource Strain: Not on file  Food Insecurity: Not on file  Transportation Needs: Not on file  Physical Activity: Not on file  Stress: Not on file  Social Connections: Not on file  Intimate Partner Violence: Not on file    REVIEW OF SYSTEMS: Constitutional: Fatigue, weakness. ENT:  No nose bleeds Pulm: Shortness of breath, nonproductive cough. CV:  No palpitations, no LE edema.  Chest pain, nonexertional GU:  No hematuria, no frequency GI: See HPI. Heme: Denies excessive or unusual bleeding or bruising. Transfusions: None. Neuro:  No headaches, no peripheral tingling or numbness.  No seizures, no syncope.  Tremors associated with her Parkinson's. Derm:  No itching, no rash or sores.  Endocrine:  No sweats or chills.  No polyuria or dysuria Immunization: Reviewed.  Has been vaccinated and boosted with [place] Travel:  None beyond local counties in last few months.    PHYSICAL EXAM: Vital signs in last 24 hours: Vitals:   07/11/20 0800 07/11/20 1100  BP:  (!) 107/97  Pulse: 85 100  Resp:  18  Temp:    SpO2: 97% 100%   Wt Readings from Last 3 Encounters:  07/11/20 74.7 kg  07/10/20 75.8 kg  05/17/20 79.1 kg    General: Frail, looks chronically ill.  Alert but delayed responses to questions. Head: No signs of head trauma.  No facial asymmetry Eyes: No conjunctival pallor or scleral icterus.  Post cataract appearance to  the pupils. Ears: Slight HOH Nose: No congestion or discharge Mouth: Partial dentures in place.  Mucosa is moist, pink, clear.  Tongue midline Neck: No JVD, no masses, no thyromegaly Lungs: Clear bilaterally without labored breathing or cough Heart: Irregularly irregular.  Rate fluctuating in the mid 90s to low 100s. Abdomen: Soft without tenderness.  Active bowel sounds.  No HSM, masses, bruits, hernias.   Rectal: Deferred Musc/Skeltl: No joint redness, swelling or gross deformity. Extremities: No CCE. Neurologic: Pill-rolling tremor in the hands.  Involuntary slow jerking of the arms.  Moves all 4 limbs, strength not tested.  Delayed responses to questions but able to answer these fairly accurately Skin: No significant purpura or bruising.  No open sores.  No rash Nodes: No cervical adenopathy Psych: Calm, cooperative.  Intake/Output from previous day: 03/29 0701 - 03/30 0700 In: 695 [P.O.:240; I.V.:455] Out: 200 [Urine:200] Intake/Output this shift: No intake/output data recorded.  LAB RESULTS: Recent Labs    07/10/20 1828 07/11/20 0406  WBC 8.0 6.4  HGB 13.2 12.2  HCT 38.7 35.5*  PLT 237 212   BMET Lab Results  Component Value Date   NA 140 07/11/2020   NA 139 07/10/2020   NA 143 04/20/2019   K 3.3 (L) 07/11/2020  K 3.4 (L) 07/10/2020   K 3.9 04/20/2019   CL 105 07/11/2020   CL 105 07/10/2020   CL 105 04/20/2019   CO2 28 07/11/2020   CO2 27 07/10/2020   CO2 24 04/20/2019   GLUCOSE 98 07/11/2020   GLUCOSE 112 (H) 07/10/2020   GLUCOSE 103 (H) 04/20/2019   BUN 19 07/11/2020   BUN 22 07/10/2020   BUN 21 04/20/2019   CREATININE 1.45 (H) 07/11/2020   CREATININE 1.62 (H) 07/10/2020   CREATININE 1.35 (H) 04/20/2019   CALCIUM 8.7 (L) 07/11/2020   CALCIUM 8.6 (L) 07/10/2020   CALCIUM 10.0 04/20/2019   LFT Recent Labs    07/10/20 1828  PROT 6.0*  ALBUMIN 3.6  AST 28  ALT 7  ALKPHOS 60  BILITOT 0.6   PT/INR Lab Results  Component Value Date   INR  0.95 08/06/2017   INR 1.04 08/05/2017   INR 0.97 08/04/2017   Hepatitis Panel No results for input(s): HEPBSAG, HCVAB, HEPAIGM, HEPBIGM in the last 72 hours. C-Diff No components found for: CDIFF Lipase     Component Value Date/Time   LIPASE 31 08/20/2017 1224    Drugs of Abuse     Component Value Date/Time   LABOPIA NEG 03/28/2015 1005   COCAINSCRNUR NEG 03/28/2015 1005   LABBENZ PPS 03/28/2015 1005   AMPHETMU NEG 03/28/2015 1005   AMPHETMU NONE DETECTED 12/10/2013 1600   THCU 21 (H) 03/28/2015 1005   THCU NONE DETECTED 12/10/2013 1600   LABBARB NONE DETECTED 12/10/2013 1600     RADIOLOGY STUDIES: CT HEAD WO CONTRAST  Result Date: 07/11/2020 CLINICAL DATA:  Un witnessed fall with headaches and dizziness EXAM: CT HEAD WITHOUT CONTRAST TECHNIQUE: Contiguous axial images were obtained from the base of the skull through the vertex without intravenous contrast. COMPARISON:  12/06/2013 FINDINGS: Brain: Mild atrophic changes and chronic white matter ischemic changes are noted. The white matter changes have progressed in the interval from the prior exam. No findings to suggest acute hemorrhage, acute infarction or space-occupying mass lesion are seen. Vascular: No hyperdense vessel or unexpected calcification. Skull: Normal. Negative for fracture or focal lesion. Sinuses/Orbits: No acute finding. Other: None. IMPRESSION: Chronic changes without acute abnormality. Electronically Signed   By: Inez Catalina M.D.   On: 07/11/2020 00:03   DG ESOPHAGUS W SINGLE CM (SOL OR THIN BA)  Result Date: 07/11/2020 CLINICAL DATA:  Esophageal stricture. Additional history provided: Patient reports dysphagia with food and liquids. EXAM: ESOPHOGRAM / BARIUM SWALLOW / BARIUM TABLET STUDY TECHNIQUE: Single contrast examination performed using thick and thin barium liquid. The patient was observed with fluoroscopy swallowing a 13 mm barium sulphate tablet. FLUOROSCOPY TIME:  Fluoroscopy Time:  2 minutes, 48  seconds Radiation Exposure Index (if provided by the fluoroscopic device): 70.70 mGy Number of Acquired Spot Images: 1 COMPARISON:  Prior chest radiographs 09/29/2018 and earlier. Chest CT 08/31/2006. barium esophagram 12/19/2011. FINDINGS: The examination is somewhat limited due to the patient's inability to stand and limited patient mobility. The esophagus is patulous. However, there is a somewhat narrowed appearance at the level of the distal esophagus and GE junction. Additionally, a 13 mm barium tablet did not pass from the distal esophagus into the stomach despite a prolonged period of observation. Small sliding hiatal hernia. Moderate intermittent esophageal dysmotility. Moderate volume gastroesophageal reflux observed to the level of the mid/upper esophagus. IMPRESSION: Somewhat limited examination due to the patient's inability to stand and limited patient mobility. There is a narrowed appearance at the level  of the distal esophagus and GE junction. Additionally, a 13 mm barium tablet did not pass from the distal esophagus into the stomach, despite a prolonged period of observation. Findings are suspicious for a stricture at this site, and endoscopy is recommended for further evaluation. Otherwise patulous esophagus. Moderate intermittent esophageal dysmotility. Moderate-volume gastroesophageal reflux observed to the level of the mid/upper esophagus. Small sliding hiatal hernia. Electronically Signed   By: Kellie Simmering DO   On: 07/11/2020 10:10     IMPRESSION:   *   New onset A. fib with RVR.  On heparin. TEE/DCCV on hold until esophagus is better evaluated.  *    Dysphagia.   Severe esophagitis had improved on serial endoscopies in 2019.  Did not have stricturing on either of these scopes. Parkinson's likely contributing but the esophagram does suggest distal esophageal stenosis and confirms ongoing esophageal reflux and esophageal dysmotility. Home meds include omeprazole 40 mg bid  *     Hypokalemia.  *     AKI.  GFR 38.  Looks to have baseline CKD stage 3b.    *   Parkinson's disease.  Significant upper extremity tremors, some dysarthria observed on exam.    PLAN:     *    Allow full liquid diet for now. Dr. Lyndel Safe will see patient, may pursue EGD with possible esophageal dilatation tomorrow but she will need to be off heparin for 4 hours prior to this.   Azucena Freed  07/11/2020, 12:17 PM Phone [phone]   Attending physician's note   I have taken an interval history, reviewed the chart and examined the patient. I agree with the Advanced Practitioner's note, impression and recommendations.   Eso dysphagia with Ba swallow showing distal eso stricture with HH and eso dysmotility.  H/O erosive esophagitis with HH on EGD 2019. Has some component of oropharyngeal dysphagia d/t Parkinson's.  New onset A. Fib on heparin.  Plan: -EGD with dil tomorrow at 11 AM.  Stop heparin 4 hours before. -Discussed risks and benefits with pt and pt's husband. -Long-term PPIs-Protonix 40 mg p.o. once a day for now. -Nonpharmacologic means of reflux control and aspiration precautions. -D/W nursing staff.    Carmell Austria, MD [facility] [phone]

## 2020-07-11 NOTE — Plan of Care (Signed)
  Problem: Education: Goal: Knowledge of disease or condition will improve 07/11/2020 2156 by Robley Fries, RN Outcome: Progressing 07/11/2020 2156 by Robley Fries, RN Outcome: Progressing Goal: Understanding of medication regimen will improve 07/11/2020 2156 by Robley Fries, RN Outcome: Progressing 07/11/2020 2156 by Robley Fries, RN Outcome: Progressing Goal: Individualized Educational Video(s) 07/11/2020 2156 by Robley Fries, RN Outcome: Progressing 07/11/2020 2156 by Robley Fries, RN Outcome: Progressing   Problem: Activity: Goal: Ability to tolerate increased activity will improve 07/11/2020 2156 by Robley Fries, RN Outcome: Progressing 07/11/2020 2156 by Robley Fries, RN Outcome: Progressing   Problem: Cardiac: Goal: Ability to achieve and maintain adequate cardiopulmonary perfusion will improve 07/11/2020 2156 by Robley Fries, RN Outcome: Progressing 07/11/2020 2156 by Robley Fries, RN Outcome: Progressing   Problem: Health Behavior/Discharge Planning: Goal: Ability to safely manage health-related needs after discharge will improve 07/11/2020 2156 by Robley Fries, RN Outcome: Progressing 07/11/2020 2156 by Robley Fries, RN Outcome: Progressing   Problem: Education: Goal: Knowledge of General Education information will improve Description: Including pain rating scale, medication(s)/side effects and non-pharmacologic comfort measures Outcome: Progressing   Problem: Health Behavior/Discharge Planning: Goal: Ability to manage health-related needs will improve Outcome: Progressing   Problem: Clinical Measurements: Goal: Ability to maintain clinical measurements within normal limits will improve Outcome: Progressing Goal: Will remain free from infection Outcome: Progressing Goal: Diagnostic test results will improve Outcome: Progressing Goal: Respiratory complications will improve Outcome: Progressing Goal: Cardiovascular  complication will be avoided Outcome: Progressing   Problem: Activity: Goal: Risk for activity intolerance will decrease Outcome: Progressing   Problem: Nutrition: Goal: Adequate nutrition will be maintained Outcome: Progressing   Problem: Coping: Goal: Level of anxiety will decrease Outcome: Progressing   Problem: Elimination: Goal: Will not experience complications related to bowel motility Outcome: Progressing Goal: Will not experience complications related to urinary retention Outcome: Progressing   Problem: Pain Managment: Goal: General experience of comfort will improve Outcome: Progressing   Problem: Safety: Goal: Ability to remain free from injury will improve Outcome: Progressing   Problem: Skin Integrity: Goal: Risk for impaired skin integrity will decrease Outcome: Progressing

## 2020-07-11 NOTE — Evaluation (Signed)
Physical Therapy Evaluation Patient Details Name: [name] MRN: [id number] DOB: Nov 10, 1945 Today's Date: 07/11/2020   History of Present Illness  75 y.o. female presenting as direct admit from family medicine appointment where she was found to be in Afib with RVR. Pt with reports of several weeks of chest pain and SOB. Pt also reports a recent fall with, endorses hitting her head, PMH is significant for hypertension, pulmonary hypertension, DM2, hyperlipidemia, CKD stage III, Parkinson's disease.  Clinical Impression  Pt presents to PT with deficits in balance, gait, functional mobility, motor control, endurance, power. Pt with BUE tremors noted at rest, reports difficulty with fine motor tasks and a recent history of increased falls. Pt requires PT assistance for safety when mobilizing this session. Pt drifts to R side when ambulating and bumps into multiple objects, blaming walker for R lateral drift as it is different than her typical 4 wheeled walker. Pt will benefit from assessment of gait with 4 wheeled walker next session to better determine DME needs and to assess for safety. PT recommends discharge home with HHPT and assistance from spouse for OOB activity.    Follow Up Recommendations Home health PT;Supervision for mobility/OOB    Equipment Recommendations  None recommended by PT    Recommendations for Other Services       Precautions / Restrictions Precautions Precautions: Fall Restrictions Weight Bearing Restrictions: No      Mobility  Bed Mobility Overal bed mobility: Needs Assistance Bed Mobility: Supine to Sit     Supine to sit: Supervision;HOB elevated          Transfers Overall transfer level: Needs assistance Equipment used: Rolling walker (2 wheeled) Transfers: Sit to/from Stand Sit to Stand: Min guard            Ambulation/Gait Ambulation/Gait assistance: Min guard Gait Distance (Feet): 100 Feet Assistive device: Rolling walker (2  wheeled) Gait Pattern/deviations: Drifts right/left Gait velocity: reduced Gait velocity interpretation: <1.31 ft/sec, indicative of household ambulator General Gait Details: pt with slowed step-through gait, pt drifts to R side throughout session, bumping into multiple objects on this side. Pt also with increased time to change direction or turn  Stairs            Wheelchair Mobility    Modified Rankin (Stroke Patients Only)       Balance Overall balance assessment: Needs assistance Sitting-balance support: No upper extremity supported;Feet supported Sitting balance-Leahy Scale: Good     Standing balance support: Single extremity supported;During functional activity Standing balance-Leahy Scale: Poor Standing balance comment: reliant on unilateral UE support                             Pertinent Vitals/Pain Pain Assessment: Faces Faces Pain Scale: Hurts even more Pain Location: buttocks and low back Pain Descriptors / Indicators: Aching Pain Intervention(s): Monitored during session    Home Living Family/patient expects to be discharged to:: Private residence Living Arrangements: Spouse/significant other Available Help at Discharge: Family;Available 24 hours/day Type of Home: House Home Access: Stairs to enter Entrance Stairs-Rails: Can reach both Entrance Stairs-Number of Steps: 4 Home Layout: One level Home Equipment: Walker - 4 wheels;Tub bench      Prior Function Level of Independence: Needs assistance   Gait / Transfers Assistance Needed: pt reports ambulating with 4 wheeled walker in the community, typically without device in the home although she has been utilizing the 4 wheeled walker indoors recently due to falls.  ADL's / Homemaking Assistance Needed: pt requries assistance for ADLs        Hand Dominance        Extremity/Trunk Assessment   Upper Extremity Assessment Upper Extremity Assessment: RUE deficits/detail;LUE  deficits/detail RUE Coordination: decreased gross motor;decreased fine motor LUE Coordination: decreased gross motor;decreased fine motor    Lower Extremity Assessment Lower Extremity Assessment: Generalized weakness    Cervical / Trunk Assessment Cervical / Trunk Assessment: Kyphotic  Communication   Communication: No difficulties  Cognition Arousal/Alertness: Awake/alert Behavior During Therapy: WFL for tasks assessed/performed Overall Cognitive Status: Within Functional Limits for tasks assessed                                        General Comments General comments (skin integrity, edema, etc.): pt tachy into 120s with mobility. BP in sitting noted to be elevated, 166/110, however this may be an inaccurate reading due to UE tremors    Exercises     Assessment/Plan    PT Assessment Patient needs continued PT services  PT Problem List Decreased strength;Decreased activity tolerance;Decreased mobility;Decreased balance;Decreased knowledge of use of DME;Decreased safety awareness;Pain       PT Treatment Interventions DME instruction;Gait training;Stair training;Functional mobility training;Therapeutic activities;Balance training;Therapeutic exercise;Neuromuscular re-education;Patient/family education    PT Goals (Current goals can be found in the Care Plan section)  Acute Rehab PT Goals Patient Stated Goal: to improve balance and reduce falls risk PT Goal Formulation: With patient Time For Goal Achievement: 07/25/20 Potential to Achieve Goals: Good    Frequency Min 3X/week   Barriers to discharge        Co-evaluation               AM-PAC PT "6 Clicks" Mobility  Outcome Measure Help needed turning from your back to your side while in a flat bed without using bedrails?: A Little Help needed moving from lying on your back to sitting on the side of a flat bed without using bedrails?: A Little Help needed moving to and from a bed to a chair  (including a wheelchair)?: A Little Help needed standing up from a chair using your arms (e.g., wheelchair or bedside chair)?: A Little Help needed to walk in hospital room?: A Little Help needed climbing 3-5 steps with a railing? : A Little 6 Click Score: 18    End of Session Equipment Utilized During Treatment: Gait belt Activity Tolerance: Patient tolerated treatment well Patient left: in chair;with call bell/phone within reach;with chair alarm set;with family/visitor present Nurse Communication: Mobility status PT Visit Diagnosis: Unsteadiness on feet (R26.81);Other abnormalities of gait and mobility (R26.89);History of falling (Z91.81)    Time: 7494-4967 PT Time Calculation (min) (ACUTE ONLY): 33 min   Charges:   PT Evaluation $PT Eval Low Complexity: Bryson, PT, DPT Acute Rehabilitation Pager: [phone]   Zenaida Niece 07/11/2020, 10:20 AM

## 2020-07-11 NOTE — Progress Notes (Signed)
Progress Note  [name] Date of Encounter: 07/11/2020  [facility] Cardiologist: Fransico Him, MD   Subjective   Denies any chest pain or SOB.  Remains in atrial fibrillation with RVR with soft BP.   Inpatient Medications    Scheduled Meds: . carbidopa-levodopa  1 tablet Oral 5 X Daily  . enoxaparin (LOVENOX) injection  1 mg/kg Subcutaneous Q12H  . fluticasone  2 spray Each Nare Daily  . gabapentin  600 mg Oral BID WC  . gabapentin  900 mg Oral QHS  . metoprolol tartrate  25 mg Oral Q6H  . pantoprazole  40 mg Oral Daily  . potassium chloride  30 mEq Oral BID  . sertraline  100 mg Oral Daily   Continuous Infusions: . lactated ringers 75 mL/hr at 07/11/20 0400  . magnesium sulfate bolus IVPB     PRN Meds: acetaminophen   Vital Signs    Vitals:   07/10/20 2000 07/10/20 2100 07/11/20 0300 07/11/20 0403  BP:  (!) 102/56 91/71 102/69  Pulse:  (!) 106 85 80  Resp:  16 18 20   Temp:  98.7 F (37.1 C)  98 F (36.7 C)  TempSrc:  Oral  Oral  SpO2:  95% 96% 96%  Weight: 74.7 kg   74.7 kg  Height: 5\' 4"  (1.626 m)       Intake/Output Summary (Last 24 hours) at 07/11/2020 0840 Last data filed at 07/11/2020 0444 Gross per 24 hour  Intake 695 ml  Output 200 ml  Net 495 ml   Last 3 Weights 07/11/2020 07/10/2020 07/10/2020  Weight (lbs) 164 lb 11.2 oz 164 lb 9.6 oz 167 lb  Weight (kg) 74.707 kg 74.662 kg 75.751 kg      Telemetry    Atrial fibrillation with HR 90-100's - Personally Reviewed  ECG    Atrial fibrillation with anterolateral ST/T wave abnormality - Personally Reviewed  Physical Exam   GEN: frail appearing   Neck: No JVD Cardiac: irregularly irregular and tachy, no murmurs, rubs, or gallops.  Respiratory: Clear to auscultation bilaterally. GI: Soft, nontender, non-distended  MS: No edema; No deformity. Neuro:  Nonfocal  Psych: Normal affect   Labs    High Sensitivity Troponin:   Recent Labs  Lab 07/10/20 1828  07/10/20 2122  TROPONINIHS 135* 87*      Chemistry Recent Labs  Lab 07/10/20 1828 07/11/20 0406  NA 139 140  K 3.4* 3.3*  CL 105 105  CO2 27 28  GLUCOSE 112* 98  BUN 22 19  CREATININE 1.62* 1.45*  CALCIUM 8.6* 8.7*  PROT 6.0*  --   ALBUMIN 3.6  --   AST 28  --   ALT 7  --   ALKPHOS 60  --   BILITOT 0.6  --   GFRNONAA 33* 38*  ANIONGAP 7 7     Hematology Recent Labs  Lab 07/10/20 1828 07/11/20 0406  WBC 8.0 6.4  RBC 4.10 3.75*  HGB 13.2 12.2  HCT 38.7 35.5*  MCV 94.4 94.7  MCH 32.2 32.5  MCHC 34.1 34.4  RDW 13.6 13.5  PLT 237 212    BNP Recent Labs  Lab 07/10/20 1821  BNP 507.7*     DDimer No results for input(s): DDIMER in the last 168 hours.   CHA2DS2-VASc Score = 5  This indicates a 7.2% annual risk of stroke. The patient's score is based upon: CHF History: No HTN History: Yes Diabetes History: Yes Stroke History: No Vascular Disease  History: No Age Score: 2 Gender Score: 1   Radiology    CT HEAD WO CONTRAST  Result Date: 07/11/2020 CLINICAL DATA:  Un witnessed fall with headaches and dizziness EXAM: CT HEAD WITHOUT CONTRAST TECHNIQUE: Contiguous axial images were obtained from the base of the skull through the vertex without intravenous contrast. COMPARISON:  12/06/2013 FINDINGS: Brain: Mild atrophic changes and chronic white matter ischemic changes are noted. The white matter changes have progressed in the interval from the prior exam. No findings to suggest acute hemorrhage, acute infarction or space-occupying mass lesion are seen. Vascular: No hyperdense vessel or unexpected calcification. Skull: Normal. Negative for fracture or focal lesion. Sinuses/Orbits: No acute finding. Other: None. IMPRESSION: Chronic changes without acute abnormality. Electronically Signed   By: Inez Catalina M.D.   On: 07/11/2020 00:03    Cardiac Studies   2D echo 09/2019 IMPRESSIONS   1. EF 60-65%, Grade 2 DD, increased LAP, mild to moderately elevated RVSP   (49 mmHG). No change from prior.  2. Left ventricular ejection fraction, by estimation, is 60 to 65%. The  left ventricle has normal function. The left ventricle has no regional  wall motion abnormalities. Left ventricular diastolic parameters are  consistent with Grade II diastolic  dysfunction (pseudonormalization). Elevated left atrial pressure.  3. Right ventricular systolic function is normal. The right ventricular  size is normal. There is mildly elevated pulmonary artery systolic  pressure. The estimated right ventricular systolic pressure is 89.2 mmHg.  4. Left atrial size was mild to moderately dilated.  5. The mitral valve is grossly normal. Mild mitral valve regurgitation.  No evidence of mitral stenosis.  6. The aortic valve is grossly normal. Aortic valve regurgitation is not  visualized. No aortic stenosis is present.  7. The inferior vena cava is normal in size with greater than 50%  respiratory variability, suggesting right atrial pressure of 3 mmHg.   Stress test 09/2018 Study Highlights    The left ventricular ejection fraction is hyperdynamic (>65%).  Nuclear stress EF: 80%.  There was no ST segment deviation noted during stress.  No T wave inversion was noted during stress.  The study is normal.  This is a low risk study.   Low risk stress nuclear study with normal perfusion and normal left ventricular regional and global systolic function.   Patient Profile     75 y.o. 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 She is being seen today for the evaluation of a new diagnosis of atrial fibrillation.   Assessment & Plan    Atrial fibrillation: -The onset of  her AF is very unclear as her symptoms are all not well correlated and more long-standing. -Last documented sinus rhythm was 12/2019, though HR was 55 in clinic 2 months ago, so unlikely she was in AF at that time.  -She is not very active and her multitude of symptoms all seem much more chronic without any acute worsening recently that could be attributed to AF.  -Given her extensive history of dysphagia and esophageal pathology, her TEE risk is certainly elevated with last EGD 3 years ago.  -Given her stable hgb and no symptoms of upper GIB, TEE/DCCV would be reasonable if barium swallow were normal without repeat EGD.  -It is difficult  to say if any of her dyspnea is related without trial of restoring sinus rhythm. If she were to have recurrence without clear change in symptoms, I think rate control would be a reasonable option going forward.  -CHADS2-VASc is 6, high stroke risk and documented resolution of the esophageal ulceration.  -change SQ Lovenox to Eliquis 5mg  BID - Metoprolol 25mg  every 6 hours for continued rate control but cannot increase further due to soft BP - since she is not adequate rate controlled and cannot titrate meds further due to soft BP>>will plan for TEE/DCCV tomorrow -I will get a Barium swallow today to evaluate her esophagus given significant GI hx in the past today>>she is NPO -Shared Decision Making/Informed Consent The risks [stroke, cardiac arrhythmias rarely resulting in the need for a temporary or permanent pacemaker, skin irritation or burns, esophageal damage, perforation (1:10,000 risk), bleeding, pharyngeal hematoma as well as other potential complications associated with conscious sedation including aspiration, arrhythmia, respiratory failure and death], benefits (treatment guidance, restoration of normal sinus rhythm, diagnostic support) and alternatives of a transesophageal echocardiogram guided cardioversion were discussed in detail with Ms. Hoogland and she is  willing to proceed.  Chronic chest pain: -The chest pain seems non-cardiac and is very chronic without recent change in symptoms, previously evaluated without evidence of ischemia.  -hsTroponin is likely due to atrial fibrillation with RVR in setting of CKD (135>87). -she had a nuclear stress test in 2020 for chronic CP that showed no ischemia  HFpEF, PH, exertional dyspnea: -The exertional dyspnea is long standing and she has had a quite extensive work-up previously, detailed in the HPI. -Probably multifactorial in the setting of HFpEF, worsening Parkinson disease, deconditioning, and possibly the AF as well. She currently is euvolemic.   HTN -BP controlled and actually on the soft side -continue metoprolol  I have spent a total of 35 minutes with patient reviewing 2D echo, lexiscan myoview , telemetry, EKGs, labs and examining patient as well as establishing an assessment and plan that was discussed with the patient.  > 50% of time was spent in direct patient care.        For questions or updates, please contact [place] Please consult [email or web address] for contact info under        Signed, Fransico Him, MD  07/11/2020, 8:40 AM

## 2020-07-11 NOTE — Hospital Course (Addendum)
Pt Overview and Major Events to Date:  3/29 admitted 3/30 barium swallow 3/31 upper endoscopy with esophageal stricture dilation   Assessment and Plan: Ms. Albert is a 75 year old female who presented with new onset A. fib with RVR and dysphagia secondary to esophageal strictures.  PMH includes Parkinson's disease, HTN, HFpEF, pulmonary HTN, T2DM, HLD, CKD stage III.   New onset A. Fib w/ RVR  Chest pain  NSTEMI Hx of HFpEF Admitted directly from clinic with new-onset A. fib and chest pain.  Started on diltiazem drip and treatment dose Lovenox.  Cardiology consulted, consider risk benefits of TEE with DCCV.  Cardiology ultimately declined in favor of following up outpatient in 3 weeks for TEE with possible cardioversion at that time.  DC'd on metoprolol and Eliquis.  Rate controlled on discharge with resolution of chest pain.   Dysphagia Admitted directly from clinic for new onset A. fib as above and also worsening dysphagia.  GI consulted, performed barium swallow 3/30 which demonstrated multiple esophageal strictures.  On 3/31, underwent EGD with esophageal stricture dilation.  Biopsies obtained, GI to follow-up on those.  Discharged on soft diet and Protonix 40 mg twice daily x12 weeks.  Also instructed to take sucralfate 1 g before every meal and at bedtime x2 weeks.  GI to coordinate outpatient follow-up.

## 2020-07-11 NOTE — Assessment & Plan Note (Addendum)
Two falls in the last two weeks. Reports mechanical in nature, feet "got caught up with each other". Denies syncope, dizziness, LOC. Does not believe she hit her head. Last falls two years ago per patient. Sent to hospital for direct admission for new onset A. Fib as above.  - PT/OT eval and treat while inpatient - likely will DC to rehab for strength building after hospital stay

## 2020-07-12 ENCOUNTER — Encounter (HOSPITAL_COMMUNITY): Payer: Self-pay | Admitting: Family Medicine

## 2020-07-12 ENCOUNTER — Observation Stay (HOSPITAL_COMMUNITY): Payer: Medicare PPO | Admitting: Certified Registered Nurse Anesthetist

## 2020-07-12 ENCOUNTER — Encounter (HOSPITAL_COMMUNITY)
Admission: AD | Disposition: A | Discharge status: Home / Self Care | Payer: Self-pay | Source: Ambulatory Visit | Attending: Family Medicine

## 2020-07-12 ENCOUNTER — Observation Stay (HOSPITAL_COMMUNITY): Payer: Medicare PPO

## 2020-07-12 DIAGNOSIS — K3189 Other diseases of stomach and duodenum: Secondary | ICD-10-CM | POA: Diagnosis not present

## 2020-07-12 DIAGNOSIS — I5033 Acute on chronic diastolic (congestive) heart failure: Secondary | ICD-10-CM | POA: Diagnosis not present

## 2020-07-12 DIAGNOSIS — R1310 Dysphagia, unspecified: Secondary | ICD-10-CM | POA: Diagnosis not present

## 2020-07-12 DIAGNOSIS — K2970 Gastritis, unspecified, without bleeding: Secondary | ICD-10-CM | POA: Diagnosis not present

## 2020-07-12 DIAGNOSIS — I10 Essential (primary) hypertension: Secondary | ICD-10-CM | POA: Diagnosis not present

## 2020-07-12 DIAGNOSIS — K222 Esophageal obstruction: Secondary | ICD-10-CM | POA: Diagnosis not present

## 2020-07-12 DIAGNOSIS — K449 Diaphragmatic hernia without obstruction or gangrene: Secondary | ICD-10-CM | POA: Diagnosis not present

## 2020-07-12 DIAGNOSIS — I4891 Unspecified atrial fibrillation: Secondary | ICD-10-CM | POA: Diagnosis not present

## 2020-07-12 DIAGNOSIS — K2210 Ulcer of esophagus without bleeding: Secondary | ICD-10-CM | POA: Diagnosis not present

## 2020-07-12 DIAGNOSIS — R079 Chest pain, unspecified: Secondary | ICD-10-CM | POA: Diagnosis not present

## 2020-07-12 DIAGNOSIS — K2100 Gastro-esophageal reflux disease with esophagitis, without bleeding: Secondary | ICD-10-CM | POA: Diagnosis not present

## 2020-07-12 DIAGNOSIS — Z20822 Contact with and (suspected) exposure to covid-19: Secondary | ICD-10-CM | POA: Diagnosis not present

## 2020-07-12 DIAGNOSIS — K2950 Unspecified chronic gastritis without bleeding: Secondary | ICD-10-CM | POA: Diagnosis not present

## 2020-07-12 DIAGNOSIS — I272 Pulmonary hypertension, unspecified: Secondary | ICD-10-CM | POA: Diagnosis not present

## 2020-07-12 HISTORY — PX: BALLOON DILATION: SHX5330

## 2020-07-12 HISTORY — PX: ESOPHAGOGASTRODUODENOSCOPY (EGD) WITH PROPOFOL: SHX5813

## 2020-07-12 HISTORY — PX: BIOPSY: SHX5522

## 2020-07-12 LAB — BASIC METABOLIC PANEL
Anion gap: 6 (ref 5–15)
BUN: 11 mg/dL (ref 8–23)
CO2: 26 mmol/L (ref 22–32)
Calcium: 9 mg/dL (ref 8.9–10.3)
Chloride: 107 mmol/L (ref 98–111)
Creatinine, Ser: 1.23 mg/dL — ABNORMAL HIGH (ref 0.44–1.00)
GFR, Estimated: 46 mL/min — ABNORMAL LOW (ref >60)
Glucose, Bld: 90 mg/dL (ref 70–99)
Potassium: 4.5 mmol/L (ref 3.5–5.1)
Sodium: 139 mmol/L (ref 135–145)

## 2020-07-12 LAB — CBC
HCT: 34.7 % — ABNORMAL LOW (ref 36.0–46.0)
Hemoglobin: 11.5 g/dL — ABNORMAL LOW (ref 12.0–15.0)
MCH: 32.2 pg (ref 26.0–34.0)
MCHC: 33.1 g/dL (ref 30.0–36.0)
MCV: 97.2 fL (ref 80.0–100.0)
Platelets: 192 10*3/uL (ref 150–400)
RBC: 3.57 MIL/uL — ABNORMAL LOW (ref 3.87–5.11)
RDW: 13.5 % (ref 11.5–15.5)
WBC: 5.4 10*3/uL (ref 4.0–10.5)
nRBC: 0 % (ref 0.0–0.2)

## 2020-07-12 LAB — HEPARIN LEVEL (UNFRACTIONATED): Heparin Unfractionated: 0.66 IU/mL (ref 0.30–0.70)

## 2020-07-12 LAB — HEMOGLOBIN A1C
Hgb A1c MFr Bld: 5.6 % (ref 4.8–5.6)
Mean Plasma Glucose: 114 mg/dL

## 2020-07-12 LAB — URINE CULTURE

## 2020-07-12 LAB — SARS CORONAVIRUS 2 BY RT PCR (HOSPITAL ORDER, PERFORMED IN ~~LOC~~ HOSPITAL LAB): SARS Coronavirus 2: NEGATIVE

## 2020-07-12 SURGERY — ESOPHAGOGASTRODUODENOSCOPY (EGD) WITH PROPOFOL
Anesthesia: Monitor Anesthesia Care

## 2020-07-12 SURGERY — CANCELLED PROCEDURE

## 2020-07-12 MED ORDER — PHENYLEPHRINE 40 MCG/ML (10ML) SYRINGE FOR IV PUSH (FOR BLOOD PRESSURE SUPPORT)
PREFILLED_SYRINGE | INTRAVENOUS | Status: DC | PRN
  Administered 2020-07-12 (×2): 160 ug via INTRAVENOUS
  Administered 2020-07-12: 80 ug via INTRAVENOUS

## 2020-07-12 MED ORDER — PANTOPRAZOLE SODIUM 40 MG PO TBEC
40.0000 mg | DELAYED_RELEASE_TABLET | Freq: Two times a day (BID) | ORAL | Status: DC
  Administered 2020-07-12 – 2020-07-13 (×2): 40 mg via ORAL
  Filled 2020-07-12 (×2): qty 1

## 2020-07-12 MED ORDER — HEPARIN (PORCINE) 25000 UT/250ML-% IV SOLN
1100.0000 [IU]/h | INTRAVENOUS | Status: DC
  Administered 2020-07-12: 1100 [IU]/h via INTRAVENOUS
  Filled 2020-07-12: qty 250

## 2020-07-12 MED ORDER — LACTATED RINGERS IV SOLN: INTRAVENOUS | Status: DC

## 2020-07-12 MED ORDER — SUCRALFATE 1 GM/10ML PO SUSP
1.0000 g | Freq: Four times a day (QID) | ORAL | Status: DC
  Administered 2020-07-12 – 2020-07-13 (×5): 1 g via ORAL
  Filled 2020-07-12 (×5): qty 10

## 2020-07-12 MED ORDER — SODIUM CHLORIDE 0.9 % IV SOLN: INTRAVENOUS | Status: DC

## 2020-07-12 MED ORDER — DEXMEDETOMIDINE (PRECEDEX) IN NS 20 MCG/5ML (4 MCG/ML) IV SYRINGE
PREFILLED_SYRINGE | INTRAVENOUS | Status: DC | PRN
  Administered 2020-07-12 (×2): 4 ug via INTRAVENOUS

## 2020-07-12 MED ORDER — PROPOFOL 500 MG/50ML IV EMUL
INTRAVENOUS | Status: DC | PRN
  Administered 2020-07-12: 50 ug/kg/min via INTRAVENOUS

## 2020-07-12 MED ORDER — METOPROLOL TARTRATE 25 MG PO TABS
37.5000 mg | ORAL_TABLET | Freq: Four times a day (QID) | ORAL | Status: DC
  Administered 2020-07-12 – 2020-07-13 (×4): 37.5 mg via ORAL
  Filled 2020-07-12 (×4): qty 1

## 2020-07-12 SURGICAL SUPPLY — 15 items

## 2020-07-12 NOTE — Interval H&P Note (Signed)
History and Physical Interval Note:  07/12/2020 10:53 AM  [name]  has presented today for surgery, with the diagnosis of dysphagia.  The various methods of treatment have been discussed with the patient and family. After consideration of risks, benefits and other options for treatment, the patient has consented to  Procedure(s) with comments: ESOPHAGOGASTRODUODENOSCOPY (EGD) WITH PROPOFOL (N/A) - with dil TRANSESOPHAGEAL ECHOCARDIOGRAM WITH CARDIOVERSION as a surgical intervention.  The patient's history has been reviewed, patient examined, no change in status, stable for surgery.  I have reviewed the patient's chart and labs.  Questions were answered to the patient's satisfaction.     Jackquline Denmark

## 2020-07-12 NOTE — Progress Notes (Signed)
Progress Note  [name] Date of Encounter: 07/12/2020  [facility] Cardiologist: Fransico Him, MD   Subjective   Patient very combative this am and pulled off her clothes and monitor and said she was going home. Plan is for EGD today if patient willing to stay.   Inpatient Medications    Scheduled Meds: . carbidopa-levodopa  1 tablet Oral 5 X Daily  . fluticasone  2 spray Each Nare Daily  . gabapentin  200 mg Oral BID WC  . gabapentin  400 mg Oral QHS  . metoprolol tartrate  25 mg Oral Q6H  . pantoprazole  40 mg Oral Daily  . sertraline  100 mg Oral Daily   Continuous Infusions: . sodium chloride 20 mL/hr at 07/11/20 1952   PRN Meds: acetaminophen   Vital Signs    Vitals:   07/11/20 2335 07/12/20 0010 07/12/20 0438 07/12/20 0655  BP: 122/68 (!) 136/93  (!) 157/112  Pulse: 94 92  100  Resp:    19  Temp:  97.6 F (36.4 C)  97.7 F (36.5 C)  TempSrc:  Oral  Oral  SpO2:  97%  97%  Weight:   74.5 kg 76.4 kg  Height:        Intake/Output Summary (Last 24 hours) at 07/12/2020 0735 Last data filed at 07/12/2020 0400 Gross per 24 hour  Intake 1675.44 ml  Output 800 ml  Net 875.44 ml   Last 3 Weights 07/12/2020 07/12/2020 07/11/2020  Weight (lbs) 168 lb 8 oz 164 lb 3.2 oz 164 lb 11.2 oz  Weight (kg) 76.431 kg 74.481 kg 74.707 kg      Telemetry    Atrial fibrillation with HR 90-100's - Personally Reviewed  ECG  No new EKG to review - Personally Reviewed  Physical Exam   GEN: Well nourished, well developed in no acute distress HEENT: Normal NECK: No JVD; No carotid bruits LYMPHATICS: No lymphadenopathy CARDIAC:irregularly irregular, no murmurs, rubs, gallops RESPIRATORY:  Clear to auscultation without rales, wheezing or rhonchi  ABDOMEN: Soft, non-tender, non-distended MUSCULOSKELETAL:  No edema; No deformity  SKIN: Warm and dry NEUROLOGIC:  Alert and oriented x 3 PSYCHIATRIC:  Normal affect    Labs    High Sensitivity  Troponin:   Recent Labs  Lab 07/10/20 1828 07/10/20 2122  TROPONINIHS 135* 87*      Chemistry Recent Labs  Lab 07/10/20 1828 07/11/20 0406  NA 139 140  K 3.4* 3.3*  CL 105 105  CO2 27 28  GLUCOSE 112* 98  BUN 22 19  CREATININE 1.62* 1.45*  CALCIUM 8.6* 8.7*  PROT 6.0*  --   ALBUMIN 3.6  --   AST 28  --   ALT 7  --   ALKPHOS 60  --   BILITOT 0.6  --   GFRNONAA 33* 38*  ANIONGAP 7 7     Hematology Recent Labs  Lab 07/10/20 1828 07/11/20 0406 07/12/20 0231  WBC 8.0 6.4 5.4  RBC 4.10 3.75* 3.57*  HGB 13.2 12.2 11.5*  HCT 38.7 35.5* 34.7*  MCV 94.4 94.7 97.2  MCH 32.2 32.5 32.2  MCHC 34.1 34.4 33.1  RDW 13.6 13.5 13.5  PLT 237 212 192    BNP Recent Labs  Lab 07/10/20 1821  BNP 507.7*     DDimer No results for input(s): DDIMER in the last 168 hours.   CHA2DS2-VASc Score = 5  This indicates a 7.2% annual risk of stroke. The patient's score is based upon:  CHF History: No HTN History: Yes Diabetes History: Yes Stroke History: No Vascular Disease History: No Age Score: 2 Gender Score: 1   Radiology    CT HEAD WO CONTRAST  Result Date: 07/11/2020 CLINICAL DATA:  Un witnessed fall with headaches and dizziness EXAM: CT HEAD WITHOUT CONTRAST TECHNIQUE: Contiguous axial images were obtained from the base of the skull through the vertex without intravenous contrast. COMPARISON:  12/06/2013 FINDINGS: Brain: Mild atrophic changes and chronic white matter ischemic changes are noted. The white matter changes have progressed in the interval from the prior exam. No findings to suggest acute hemorrhage, acute infarction or space-occupying mass lesion are seen. Vascular: No hyperdense vessel or unexpected calcification. Skull: Normal. Negative for fracture or focal lesion. Sinuses/Orbits: No acute finding. Other: None. IMPRESSION: Chronic changes without acute abnormality. Electronically Signed   By: Inez Catalina M.D.   On: 07/11/2020 00:03   DG ESOPHAGUS W SINGLE  CM (SOL OR THIN BA)  Result Date: 07/11/2020 CLINICAL DATA:  Esophageal stricture. Additional history provided: Patient reports dysphagia with food and liquids. EXAM: ESOPHOGRAM / BARIUM SWALLOW / BARIUM TABLET STUDY TECHNIQUE: Single contrast examination performed using thick and thin barium liquid. The patient was observed with fluoroscopy swallowing a 13 mm barium sulphate tablet. FLUOROSCOPY TIME:  Fluoroscopy Time:  2 minutes, 48 seconds Radiation Exposure Index (if provided by the fluoroscopic device): 70.70 mGy Number of Acquired Spot Images: 1 COMPARISON:  Prior chest radiographs 09/29/2018 and earlier. Chest CT 08/31/2006. barium esophagram 12/19/2011. FINDINGS: The examination is somewhat limited due to the patient's inability to stand and limited patient mobility. The esophagus is patulous. However, there is a somewhat narrowed appearance at the level of the distal esophagus and GE junction. Additionally, a 13 mm barium tablet did not pass from the distal esophagus into the stomach despite a prolonged period of observation. Small sliding hiatal hernia. Moderate intermittent esophageal dysmotility. Moderate volume gastroesophageal reflux observed to the level of the mid/upper esophagus. IMPRESSION: Somewhat limited examination due to the patient's inability to stand and limited patient mobility. There is a narrowed appearance at the level of the distal esophagus and GE junction. Additionally, a 13 mm barium tablet did not pass from the distal esophagus into the stomach, despite a prolonged period of observation. Findings are suspicious for a stricture at this site, and endoscopy is recommended for further evaluation. Otherwise patulous esophagus. Moderate intermittent esophageal dysmotility. Moderate-volume gastroesophageal reflux observed to the level of the mid/upper esophagus. Small sliding hiatal hernia. Electronically Signed   By: Kellie Simmering DO   On: 07/11/2020 10:10    Cardiac Studies   2D  echo 09/2019 IMPRESSIONS   1. EF 60-65%, Grade 2 DD, increased LAP, mild to moderately elevated RVSP  (49 mmHG). No change from prior.  2. Left ventricular ejection fraction, by estimation, is 60 to 65%. The  left ventricle has normal function. The left ventricle has no regional  wall motion abnormalities. Left ventricular diastolic parameters are  consistent with Grade II diastolic  dysfunction (pseudonormalization). Elevated left atrial pressure.  3. Right ventricular systolic function is normal. The right ventricular  size is normal. There is mildly elevated pulmonary artery systolic  pressure. The estimated right ventricular systolic pressure is 90.2 mmHg.  4. Left atrial size was mild to moderately dilated.  5. The mitral valve is grossly normal. Mild mitral valve regurgitation.  No evidence of mitral stenosis.  6. The aortic valve is grossly normal. Aortic valve regurgitation is not  visualized. No  aortic stenosis is present.  7. The inferior vena cava is normal in size with greater than 50%  respiratory variability, suggesting right atrial pressure of 3 mmHg.   Stress test 09/2018 Study Highlights    The left ventricular ejection fraction is hyperdynamic (>65%).  Nuclear stress EF: 80%.  There was no ST segment deviation noted during stress.  No T wave inversion was noted during stress.  The study is normal.  This is a low risk study.   Low risk stress nuclear study with normal perfusion and normal left ventricular regional and global systolic function.   Patient Profile     75 y.o. 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 She is being seen today for the evaluation of a  new diagnosis of atrial fibrillation.   Assessment & Plan    Atrial fibrillation: -The onset of her AF is very unclear as her symptoms are all not well correlated and more long-standing. -Last documented sinus rhythm was 12/2019, though HR was 55 in clinic 2 months ago, so unlikely she was in AF at that time.  -She is not very active and her multitude of symptoms all seem much more chronic without any acute worsening recently that could be attributed to AF.  -Given her extensive history of dysphagia and esophageal pathology, her TEE risk is certainly elevated with last EGD 3 years ago.  -Given her stable hgb and no symptoms of upper GIB, TEE/DCCV would be reasonable if barium swallow were normal without repeat EGD.  -It is difficult to say if any of her dyspnea is related without trial of restoring sinus rhythm. If she were to have recurrence without clear change in symptoms, I think rate control would be a reasonable option going forward.  -CHADS2-VASc is 6, high stroke risk and documented resolution of the esophageal ulceration.  -change SQ Lovenox to Eliquis 5mg  BID - Metoprolol 25mg  every 6 hours for continued rate control but cannot increase further due to soft BP - since she is not adequate rate controlled and cannot titrate meds further due to soft BP>>will plan for TEE/DCCV  -Barium swallow yesterday showed possible esophageal stricutre -plan for EGD today with possible esophageal dilatation -BP high today so will increase lopressor to 37.5mg  q 6 hours for better HR control -continue Heparin for now  -will discuss with GI timing of TEE/DCCV after EGD if she gets a dilatation>>hopefully we can do TEE/DCCV later today or tomorrow  Chronic chest pain: -The chest pain seems non-cardiac and is very chronic without recent change in symptoms, previously evaluated without evidence of ischemia.  -hsTroponin is likely due to atrial fibrillation with RVR in setting of CKD (135>87). -she had a  nuclear stress test in 2020 for chronic CP that showed no ischemia  HFpEF, PH, exertional dyspnea: -The exertional dyspnea is long standing and she has had a quite extensive work-up previously, detailed in the HPI. -Probably multifactorial in the setting of HFpEF, worsening Parkinson disease, deconditioning, and possibly the AF as well. She currently is euvolemic.   HTN -BP elevated today -increase lopressor to 37.5mg  q 6 hours  For questions or updates, please contact [place] Please consult [email or web address] for contact info under        Signed, Fransico Him, MD  07/12/2020, 7:34 AM

## 2020-07-12 NOTE — Anesthesia Postprocedure Evaluation (Signed)
Anesthesia Post Note  Patient: [name]  Procedure(s) Performed: ESOPHAGOGASTRODUODENOSCOPY (EGD) WITH PROPOFOL (N/A ) BALLOON DILATION (N/A ) BIOPSY     Patient location during evaluation: Endoscopy Anesthesia Type: MAC Level of consciousness: awake and alert Pain management: pain level controlled Vital Signs Assessment: post-procedure vital signs reviewed and stable Respiratory status: spontaneous breathing, nonlabored ventilation, respiratory function stable and patient connected to nasal cannula oxygen Cardiovascular status: stable and blood pressure returned to baseline Postop Assessment: no apparent nausea or vomiting Anesthetic complications: no   No complications documented.  Last Vitals:  Vitals:   07/12/20 1234 07/12/20 1643  BP: (!) 133/109 95/61  Pulse: 95 91  Resp: 16 18  Temp: 36.8 C 36.7 C  SpO2: 98% 95%    Last Pain:  Vitals:   07/12/20 1643  TempSrc: Oral  PainSc: 0-No pain                 Geovannie Vilar COKER

## 2020-07-12 NOTE — Op Note (Signed)
[facility] [name] Procedure Date : 07/12/2020 MRN: [id number] Attending MD: Jackquline Denmark , MD Date of Birth: 02/16/1946 CSN: [id number] Age: 75 Admit Type: Inpatient Procedure:                Upper GI endoscopy Indications:              Dysphagia. GERD. Abn Ba Swallow with hang-up of                            barium tablet in distal esophagus. Providers:                Jackquline Denmark, MD, Particia Nearing, RN, Dulcy Fanny, Waynette Buttery., Technician, Trixie Deis, CRNA Referring MD:              Medicines:                Monitored Anesthesia Care Complications:            No immediate complications. Estimated Blood Loss:     Estimated blood loss: none. Procedure:                Pre-Anesthesia Assessment:                           - Prior to the procedure, a History and Physical                            was performed, and patient medications and                            allergies were reviewed. The patient's tolerance of                            previous anesthesia was also reviewed. The risks                            and benefits of the procedure and the sedation                            options and risks were discussed with the patient.                            All questions were answered, and informed consent                            was obtained. Prior Anticoagulants: The patient has                            taken heparin, last dose was day of procedure. ASA  Grade Assessment: III - A patient with severe                            systemic disease. After reviewing the risks and                            benefits, the patient was deemed in satisfactory                            condition to undergo the procedure.                           After obtaining informed consent, the endoscope was                            passed under direct vision.  Throughout the                            procedure, the patient's blood pressure, pulse, and                            oxygen saturations were monitored continuously. The                            GIF-H190 ([id number]) Olympus gastroscope was                            introduced through the mouth, and advanced to the                            second part of duodenum. The upper GI endoscopy was                            accomplished without difficulty. The patient                            tolerated the procedure well. Scope In: Scope Out: Findings:      Mid and distal esophagus was mildly tortuous. LA Grade D esophagitis       with circumferential nonbleeding ulcers was found 35 to 38 cm from the       incisors. There was distal esophageal stricture with luminal diameter of       approximately 12 mm at [place] 38 cm. Biopsies were taken with a       cold forceps for histology. A TTS dilator was passed through the scope.       Dilation with a 12-13.5-15 mm balloon dilator was performed to 15 mm.       The dilation site was examined and showed mild mucosal disruption and       moderate improvement. Estimated blood loss was minimal.      A 2 cm hiatal hernia was present.      Patchy mildly erythematous mucosa without bleeding was found in the       gastric antrum. Biopsies were taken with a cold forceps for histology.      The examined duodenum  was normal. Impression:               - LA Grade D reflux esophagitis with distal                            esophageal stricture. Biopsied. Dilated.                           - 2 cm hiatal hernia.                           - Mild gastritis. Recommendation:           - Soft diet today, then can advance to heart                            healthy diet in a.m.                           - Continue present medications including Protonix                            40 mg p.o. twice daily x 12 weeks, then can reduce                            it  to once a day.                           - Use sucralfate suspension 1 gram PO QID (qAC and                            qHS) for 2 weeks.                           - Return to GI clinic in 6-8 weeks. If still with                            dysphagia, would require further esophageal                            dilatation.                           - Resume heparin in 6 hours.                           - Hold off on TEE for at least 24-48 hours, if                            possible. D/W Dr [name].                           - The findings and recommendations were discussed                            with the patient's family. Procedure  Code(s):        --- Professional ---                           [phone], Esophagogastroduodenoscopy, flexible,                            transoral; with transendoscopic balloon dilation of                            esophagus (less than 30 mm diameter)                           43239, 59, Esophagogastroduodenoscopy, flexible,                            transoral; with biopsy, single or multiple Diagnosis Code(s):        --- Professional ---                           K21.00, Gastro-esophageal reflux disease with                            esophagitis, without bleeding                           K44.9, Diaphragmatic hernia without obstruction or                            gangrene                           K31.89, Other diseases of stomach and duodenum                           R13.10, Dysphagia, unspecified CPT copyright 2019 American Medical Association. All rights reserved. The codes documented in this report are preliminary and upon coder review may  be revised to meet current compliance requirements. Jackquline Denmark, MD 07/12/2020 11:44:11 AM This report has been signed electronically. Number of Addenda: 0

## 2020-07-12 NOTE — Progress Notes (Signed)
IV team came to place IV and pt refused. Will notify MD and make aware.

## 2020-07-12 NOTE — Anesthesia Procedure Notes (Signed)
Procedure Name: MAC Date/Time: 07/12/2020 10:57 AM Performed by: Leonor Liv, CRNA Pre-anesthesia Checklist: Patient identified, Emergency Drugs available, Suction available, Timeout performed and Patient being monitored Patient Re-evaluated:Patient Re-evaluated prior to induction Oxygen Delivery Method: Nasal cannula Airway Equipment and Method: Bite block Placement Confirmation: positive ETCO2 Dental Injury: Teeth and Oropharynx as per pre-operative assessment

## 2020-07-12 NOTE — Interval H&P Note (Signed)
History and Physical Interval Note:  07/12/2020 10:30 AM  [name]  has presented today for surgery, with the diagnosis of dysphagia.  The various methods of treatment have been discussed with the patient and family. After consideration of risks, benefits and other options for treatment, the patient has consented to  Procedure(s) with comments: ESOPHAGOGASTRODUODENOSCOPY (EGD) WITH PROPOFOL (N/A) - with dil TRANSESOPHAGEAL ECHOCARDIOGRAM WITH CARDIOVERSION as a surgical intervention.  The patient's history has been reviewed, patient examined, no change in status, stable for surgery.  I have reviewed the patient's chart and labs.  Questions were answered to the patient's satisfaction.     [name]

## 2020-07-12 NOTE — Progress Notes (Signed)
Physical Therapy Treatment Patient Details Name: [name] MRN: [id number] DOB: 14-Sep-1945 Today's Date: 07/12/2020    History of Present Illness 75 y.o. female presenting as direct admit from family medicine appointment where she was found to be in Afib with RVR. Pt with reports of several weeks of chest pain and SOB. Pt also reports a recent fall with, endorses hitting her head, PMH is significant for hypertension, pulmonary hypertension, DM2, hyperlipidemia, CKD stage III, Parkinson's disease.    PT Comments    Pttolerates treatment well, with improved gait quality with use of 4 wheeled walker. Pt initially demonstrates some reduced safety awareness, attempting to sit on walker without brakes locked, but this improves with PT verbal cues. Pt does demonstrate some alteration in mental status, often referring to "when I was in the hospital" but describing events from last night and yesterday, almost as if she is not currently in the hospital at this time. When asked pt is able to report correct place. Pt will continue to benefit from aggressive mobilization and acute PT POC to aide in a return to her baseline. PT updates recommendations to outpatient neuro PT to aide in improving balance and reducing falls risk.   Follow Up Recommendations  Outpatient PT;Supervision for mobility/OOB (family prefers outpatient neuro rehab 2/2 parkinsons)     Equipment Recommendations  None recommended by PT    Recommendations for Other Services       Precautions / Restrictions Precautions Precautions: Fall Restrictions Weight Bearing Restrictions: No    Mobility  Bed Mobility Overal bed mobility: Modified Independent Bed Mobility: Supine to Sit     Supine to sit: Modified independent (Device/Increase time)     General bed mobility comments: increased time    Transfers Overall transfer level: Needs assistance Equipment used: 4-wheeled walker Transfers: Sit to/from Stand Sit to  Stand: Supervision         General transfer comment: verbal cues for use of brakes  Ambulation/Gait Ambulation/Gait assistance: Min guard Gait Distance (Feet): 150 Feet Assistive device: 4-wheeled walker Gait Pattern/deviations: Step-through pattern Gait velocity: reduced Gait velocity interpretation: <1.8 ft/sec, indicate of risk for recurrent falls General Gait Details: pt with steady step-through gait, no lateral drift present today, improved gait speed and management of turns   Stairs             Wheelchair Mobility    Modified Rankin (Stroke Patients Only)       Balance Overall balance assessment: Needs assistance Sitting-balance support: No upper extremity supported;Feet supported Sitting balance-Leahy Scale: Fair     Standing balance support: Single extremity supported;Bilateral upper extremity supported Standing balance-Leahy Scale: Poor Standing balance comment: reliant on unilateral UE support                            Cognition Arousal/Alertness: Awake/alert Behavior During Therapy: WFL for tasks assessed/performed Overall Cognitive Status: Impaired/Different from baseline Area of Impairment: Memory;Safety/judgement;Awareness                     Memory: Decreased short-term memory   Safety/Judgement: Decreased awareness of safety Awareness: Emergent          Exercises      General Comments General comments (skin integrity, edema, etc.): pt tachy into 120s, reports mild lightheadedness which does not affect ability to ambulate at this time      Pertinent Vitals/Pain Pain Assessment: Faces Faces Pain Scale: No hurt    Home  Living                      Prior Function            PT Goals (current goals can now be found in the care plan section) Acute Rehab PT Goals Patient Stated Goal: to have some rehab to be able to take care of herself for small periods of time Progress towards PT goals: Progressing  toward goals    Frequency    Min 3X/week      PT Plan Current plan remains appropriate    Co-evaluation              AM-PAC PT "6 Clicks" Mobility   Outcome Measure  Help needed turning from your back to your side while in a flat bed without using bedrails?: A Little Help needed moving from lying on your back to sitting on the side of a flat bed without using bedrails?: A Little Help needed moving to and from a bed to a chair (including a wheelchair)?: A Little Help needed standing up from a chair using your arms (e.g., wheelchair or bedside chair)?: A Little Help needed to walk in hospital room?: A Little Help needed climbing 3-5 steps with a railing? : A Little 6 Click Score: 18    End of Session   Activity Tolerance: Patient tolerated treatment well Patient left: in bed;with call bell/phone within reach;with bed alarm set;with family/visitor present Nurse Communication: Mobility status PT Visit Diagnosis: Unsteadiness on feet (R26.81);Other abnormalities of gait and mobility (R26.89);History of falling (Z91.81)     Time: 2505-3976 PT Time Calculation (min) (ACUTE ONLY): 19 min  Charges:  $Gait Training: 8-22 mins                     Zenaida Niece, PT, DPT Acute Rehabilitation Pager: [phone]    Zenaida Niece 07/12/2020, 11:54 AM

## 2020-07-12 NOTE — H&P (View-Only) (Signed)
Progress Note  [name] Date of Encounter: 07/12/2020  [facility] Cardiologist: Fransico Him, MD   Subjective   Patient very combative this am and pulled off her clothes and monitor and said she was going home. Plan is for EGD today if patient willing to stay.   Inpatient Medications    Scheduled Meds: . carbidopa-levodopa  1 tablet Oral 5 X Daily  . fluticasone  2 spray Each Nare Daily  . gabapentin  200 mg Oral BID WC  . gabapentin  400 mg Oral QHS  . metoprolol tartrate  25 mg Oral Q6H  . pantoprazole  40 mg Oral Daily  . sertraline  100 mg Oral Daily   Continuous Infusions: . sodium chloride 20 mL/hr at 07/11/20 1952   PRN Meds: acetaminophen   Vital Signs    Vitals:   07/11/20 2335 07/12/20 0010 07/12/20 0438 07/12/20 0655  BP: 122/68 (!) 136/93  (!) 157/112  Pulse: 94 92  100  Resp:    19  Temp:  97.6 F (36.4 C)  97.7 F (36.5 C)  TempSrc:  Oral  Oral  SpO2:  97%  97%  Weight:   74.5 kg 76.4 kg  Height:        Intake/Output Summary (Last 24 hours) at 07/12/2020 0735 Last data filed at 07/12/2020 0400 Gross per 24 hour  Intake 1675.44 ml  Output 800 ml  Net 875.44 ml   Last 3 Weights 07/12/2020 07/12/2020 07/11/2020  Weight (lbs) 168 lb 8 oz 164 lb 3.2 oz 164 lb 11.2 oz  Weight (kg) 76.431 kg 74.481 kg 74.707 kg      Telemetry    Atrial fibrillation with HR 90-100's - Personally Reviewed  ECG  No new EKG to review - Personally Reviewed  Physical Exam   GEN: Well nourished, well developed in no acute distress HEENT: Normal NECK: No JVD; No carotid bruits LYMPHATICS: No lymphadenopathy CARDIAC:irregularly irregular, no murmurs, rubs, gallops RESPIRATORY:  Clear to auscultation without rales, wheezing or rhonchi  ABDOMEN: Soft, non-tender, non-distended MUSCULOSKELETAL:  No edema; No deformity  SKIN: Warm and dry NEUROLOGIC:  Alert and oriented x 3 PSYCHIATRIC:  Normal affect    Labs    High Sensitivity  Troponin:   Recent Labs  Lab 07/10/20 1828 07/10/20 2122  TROPONINIHS 135* 87*      Chemistry Recent Labs  Lab 07/10/20 1828 07/11/20 0406  NA 139 140  K 3.4* 3.3*  CL 105 105  CO2 27 28  GLUCOSE 112* 98  BUN 22 19  CREATININE 1.62* 1.45*  CALCIUM 8.6* 8.7*  PROT 6.0*  --   ALBUMIN 3.6  --   AST 28  --   ALT 7  --   ALKPHOS 60  --   BILITOT 0.6  --   GFRNONAA 33* 38*  ANIONGAP 7 7     Hematology Recent Labs  Lab 07/10/20 1828 07/11/20 0406 07/12/20 0231  WBC 8.0 6.4 5.4  RBC 4.10 3.75* 3.57*  HGB 13.2 12.2 11.5*  HCT 38.7 35.5* 34.7*  MCV 94.4 94.7 97.2  MCH 32.2 32.5 32.2  MCHC 34.1 34.4 33.1  RDW 13.6 13.5 13.5  PLT 237 212 192    BNP Recent Labs  Lab 07/10/20 1821  BNP 507.7*     DDimer No results for input(s): DDIMER in the last 168 hours.   CHA2DS2-VASc Score = 5  This indicates a 7.2% annual risk of stroke. The patient's score is based upon:  CHF History: No HTN History: Yes Diabetes History: Yes Stroke History: No Vascular Disease History: No Age Score: 2 Gender Score: 1   Radiology    CT HEAD WO CONTRAST  Result Date: 07/11/2020 CLINICAL DATA:  Un witnessed fall with headaches and dizziness EXAM: CT HEAD WITHOUT CONTRAST TECHNIQUE: Contiguous axial images were obtained from the base of the skull through the vertex without intravenous contrast. COMPARISON:  12/06/2013 FINDINGS: Brain: Mild atrophic changes and chronic white matter ischemic changes are noted. The white matter changes have progressed in the interval from the prior exam. No findings to suggest acute hemorrhage, acute infarction or space-occupying mass lesion are seen. Vascular: No hyperdense vessel or unexpected calcification. Skull: Normal. Negative for fracture or focal lesion. Sinuses/Orbits: No acute finding. Other: None. IMPRESSION: Chronic changes without acute abnormality. Electronically Signed   By: Inez Catalina M.D.   On: 07/11/2020 00:03   DG ESOPHAGUS W SINGLE  CM (SOL OR THIN BA)  Result Date: 07/11/2020 CLINICAL DATA:  Esophageal stricture. Additional history provided: Patient reports dysphagia with food and liquids. EXAM: ESOPHOGRAM / BARIUM SWALLOW / BARIUM TABLET STUDY TECHNIQUE: Single contrast examination performed using thick and thin barium liquid. The patient was observed with fluoroscopy swallowing a 13 mm barium sulphate tablet. FLUOROSCOPY TIME:  Fluoroscopy Time:  2 minutes, 48 seconds Radiation Exposure Index (if provided by the fluoroscopic device): 70.70 mGy Number of Acquired Spot Images: 1 COMPARISON:  Prior chest radiographs 09/29/2018 and earlier. Chest CT 08/31/2006. barium esophagram 12/19/2011. FINDINGS: The examination is somewhat limited due to the patient's inability to stand and limited patient mobility. The esophagus is patulous. However, there is a somewhat narrowed appearance at the level of the distal esophagus and GE junction. Additionally, a 13 mm barium tablet did not pass from the distal esophagus into the stomach despite a prolonged period of observation. Small sliding hiatal hernia. Moderate intermittent esophageal dysmotility. Moderate volume gastroesophageal reflux observed to the level of the mid/upper esophagus. IMPRESSION: Somewhat limited examination due to the patient's inability to stand and limited patient mobility. There is a narrowed appearance at the level of the distal esophagus and GE junction. Additionally, a 13 mm barium tablet did not pass from the distal esophagus into the stomach, despite a prolonged period of observation. Findings are suspicious for a stricture at this site, and endoscopy is recommended for further evaluation. Otherwise patulous esophagus. Moderate intermittent esophageal dysmotility. Moderate-volume gastroesophageal reflux observed to the level of the mid/upper esophagus. Small sliding hiatal hernia. Electronically Signed   By: Kellie Simmering DO   On: 07/11/2020 10:10    Cardiac Studies   2D  echo 09/2019 IMPRESSIONS   1. EF 60-65%, Grade 2 DD, increased LAP, mild to moderately elevated RVSP  (49 mmHG). No change from prior.  2. Left ventricular ejection fraction, by estimation, is 60 to 65%. The  left ventricle has normal function. The left ventricle has no regional  wall motion abnormalities. Left ventricular diastolic parameters are  consistent with Grade II diastolic  dysfunction (pseudonormalization). Elevated left atrial pressure.  3. Right ventricular systolic function is normal. The right ventricular  size is normal. There is mildly elevated pulmonary artery systolic  pressure. The estimated right ventricular systolic pressure is 42.3 mmHg.  4. Left atrial size was mild to moderately dilated.  5. The mitral valve is grossly normal. Mild mitral valve regurgitation.  No evidence of mitral stenosis.  6. The aortic valve is grossly normal. Aortic valve regurgitation is not  visualized. No  aortic stenosis is present.  7. The inferior vena cava is normal in size with greater than 50%  respiratory variability, suggesting right atrial pressure of 3 mmHg.   Stress test 09/2018 Study Highlights    The left ventricular ejection fraction is hyperdynamic (>65%).  Nuclear stress EF: 80%.  There was no ST segment deviation noted during stress.  No T wave inversion was noted during stress.  The study is normal.  This is a low risk study.   Low risk stress nuclear study with normal perfusion and normal left ventricular regional and global systolic function.   Patient Profile     75 y.o. 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 She is being seen today for the evaluation of a  new diagnosis of atrial fibrillation.   Assessment & Plan    Atrial fibrillation: -The onset of her AF is very unclear as her symptoms are all not well correlated and more long-standing. -Last documented sinus rhythm was 12/2019, though HR was 55 in clinic 2 months ago, so unlikely she was in AF at that time.  -She is not very active and her multitude of symptoms all seem much more chronic without any acute worsening recently that could be attributed to AF.  -Given her extensive history of dysphagia and esophageal pathology, her TEE risk is certainly elevated with last EGD 3 years ago.  -Given her stable hgb and no symptoms of upper GIB, TEE/DCCV would be reasonable if barium swallow were normal without repeat EGD.  -It is difficult to say if any of her dyspnea is related without trial of restoring sinus rhythm. If she were to have recurrence without clear change in symptoms, I think rate control would be a reasonable option going forward.  -CHADS2-VASc is 6, high stroke risk and documented resolution of the esophageal ulceration.  -change SQ Lovenox to Eliquis 5mg  BID - Metoprolol 25mg  every 6 hours for continued rate control but cannot increase further due to soft BP - since she is not adequate rate controlled and cannot titrate meds further due to soft BP>>will plan for TEE/DCCV  -Barium swallow yesterday showed possible esophageal stricutre -plan for EGD today with possible esophageal dilatation -BP high today so will increase lopressor to 37.5mg  q 6 hours for better HR control -continue Heparin for now  -will discuss with GI timing of TEE/DCCV after EGD if she gets a dilatation>>hopefully we can do TEE/DCCV later today or tomorrow  Chronic chest pain: -The chest pain seems non-cardiac and is very chronic without recent change in symptoms, previously evaluated without evidence of ischemia.  -hsTroponin is likely due to atrial fibrillation with RVR in setting of CKD (135>87). -she had a  nuclear stress test in 2020 for chronic CP that showed no ischemia  HFpEF, PH, exertional dyspnea: -The exertional dyspnea is long standing and she has had a quite extensive work-up previously, detailed in the HPI. -Probably multifactorial in the setting of HFpEF, worsening Parkinson disease, deconditioning, and possibly the AF as well. She currently is euvolemic.   HTN -BP elevated today -increase lopressor to 37.5mg  q 6 hours  For questions or updates, please contact [place] Please consult [email or web address] for contact info under        Signed, Fransico Him, MD  07/12/2020, 7:34 AM

## 2020-07-12 NOTE — Progress Notes (Signed)
Nurse sitting with pt until pts husband [name] arrives.

## 2020-07-12 NOTE — Transfer of Care (Signed)
Immediate Anesthesia Transfer of Care Note  Patient: [name]  Procedure(s) Performed: ESOPHAGOGASTRODUODENOSCOPY (EGD) WITH PROPOFOL (N/A ) BALLOON DILATION (N/A ) BIOPSY  Patient Location: Endoscopy Unit  Anesthesia Type:MAC  Level of Consciousness: drowsy and patient cooperative  Airway & Oxygen Therapy: Patient Spontanous Breathing and Patient connected to nasal cannula oxygen  Post-op Assessment: Report given to RN and Post -op Vital signs reviewed and stable  Post vital signs: Reviewed and stable  Last Vitals:  Vitals Value Taken Time  BP    Temp    Pulse 91 07/12/20 1142  Resp 15 07/12/20 1142  SpO2 97 % 07/12/20 1142  Vitals shown include unvalidated device data.  Last Pain:  Vitals:   07/12/20 1032  TempSrc: Tympanic  PainSc: 7       Patients Stated Pain Goal: 3 (96/78/93 8101)  Complications: No complications documented.

## 2020-07-12 NOTE — Progress Notes (Signed)
Called to patient room as patient was putting on her clothes and wanting to leave AMA. Patient's husband is power of attorney but appeared to be unable to get her to stay at the time. Patient was discussing about her struggles with her delusions from her Parkinson's and that they "got a hold of her" and she asked if I was "a witch doctor". She spoke somewhat tangentially regarding her emotional state and then was speaking about her husband and fighting, it was difficult to follow patient's train of thought and conversation. Per nursing, the patient pulled out her IVs when getting up and getting ready; she also pulled off her cardiac monitoring and threw them at one of the nurses. Discussed with patient that she is to get the EGD today with plans to get a TEE done, she was willing to get the procedure done but was wanting to go home and shower, discussed that she will need to stay in the hospital to be able to get the procedure done today. Patient voiced understanding and was willing to stay today.    Rise Patience, DO  PGY-1 Family Medicine

## 2020-07-12 NOTE — Progress Notes (Signed)
Paged MD on call Dr. Paticia Stack. Awaiting call back.

## 2020-07-12 NOTE — Progress Notes (Signed)
Pt pulled both IVs out and became combative. Courtney, NT in with pt and calming pt down. Nurse called pts husband and left message for him to return call. IV consult order placed for new IV. Awaiting call back from pts husband.

## 2020-07-12 NOTE — Progress Notes (Signed)
Dr. Paticia Stack returned page and verbalized the heparin was to be stopped at 7 anyways. He is aware that pt is refusing a new IV and pulling her tele off. Pts husband returned call and is on his way.

## 2020-07-12 NOTE — Progress Notes (Signed)
ANTICOAGULATION CONSULT NOTE - Follow Up Consult  Pharmacy Consult for IV heparin Indication: atrial fibrillation  Allergies  Allergen Reactions  . Ace Inhibitors Anaphylaxis and Swelling  . Azilect [Rasagiline Mesylate] Other (See Comments)    hypotension  . Lisinopril Swelling    Severe facial angioedema requiring hospitalization 2007 (approx)    Patient Measurements: Height: 5\' 4"  (162.6 cm) Weight: 76.4 kg (168 lb 8 oz) IBW/kg (Calculated) : 54.7  Heparin dosing weight: 76.4 kg  Vital Signs: Temp: 97.5 F (36.4 C) (03/31 1140) Temp Source: Oral (03/31 1140) BP: 126/82 (03/31 1210) Pulse Rate: 81 (03/31 1210)  Labs: Recent Labs    07/10/20 1828 07/10/20 2122 07/11/20 0406 07/11/20 2034 07/12/20 0231  HGB 13.2  --  12.2  --  11.5*  HCT 38.7  --  35.5*  --  34.7*  PLT 237  --  212  --  192  HEPARINUNFRC  --   --   --  0.56 0.66  CREATININE 1.62*  --  1.45*  --   --   TROPONINIHS 135* 87*  --   --   --     Estimated Creatinine Clearance: 33.6 mL/min (A) (by C-G formula based on SCr of 1.45 mg/dL (H)).   Assessment: 74YOF with new diagnosis of atrial fibrillation. CHADSVASC 6 indicates high risk of stroke. IV heparin initially started to facilitate endoscopy, then held prior to procedure. EGD completed today at ~1145.   Per GI, can resume heparin 6 hours after procedure. Patient was previously therapeutic at heparin 1100 units/hr. No bolus due to atrial fibrillation indication. Hgb declining 11.5, platelets 192. No signs or symptoms of bleeding noted.    Goal of Therapy:  Heparin level 0.3-0.7 units/ml Monitor platelets by anticoagulation protocol: Yes   Plan:  Restart IV heparin 1100 units/hr @1800 . 6 hour heparin level, daily CBC.  Monitor for signs and symptoms of bleeding. F/u transition to apixaban and plans for TEE/DCCV.  Mickeal Skinner, PharmD Student 07/12/2020,12:29 PM

## 2020-07-12 NOTE — Progress Notes (Addendum)
TEE/Cardioversion cancelled  Pt having EGD   Esohagitis present with ulcers.  Biopsies planned   Pt will continue on rate control and anticoagulation (after pause)   Procedures will need to be rescheduled.  [name] MD

## 2020-07-12 NOTE — Progress Notes (Signed)
Paged GI doctor made aware that pt is putting on her clothes and trying to leave AMA. They are coming to the floor to see pt.

## 2020-07-12 NOTE — Progress Notes (Signed)
Pts husband has arrived. He is trying to talk with her about letting us start a new IV.

## 2020-07-12 NOTE — Progress Notes (Signed)
Family Medicine Teaching Service Daily Progress Note Intern Pager: [phone]  [name] Medical record number: [id number] Date of birth: 1946-04-04 Age: 75 y.o. Gender: female  Primary Care Provider: [name], MD Consultants: Cardiology, GI Code Status: Full   Pt Overview and Major Events to Date:  3/29 Admitted  3/30 Barium swallow  Assessment and Plan: [name] is a 75 y.o. female presenting with chest pain, found to be in new onset Atrial fibrillation with RVR. PMH is significant for hypertension, HFpEF, pulmonary hypertension, DM2, hyperlipidemia, CKD stage III, Parkinson's disease  New onset A. Fib w/ RVR  Chest pain  Hx of HFpEF Barium swallow yesterday showed narrowing of distal esophagus and GE junction. EGD today showed LA grade D reflex esophagitis with distal esophageal strictures which were biopsied. 2 cm hiatal hernia, mild gastritis.  - Cardiology consulted, appreciate recommendations (may transition to metoprolol q6 instead of dilt drip)   - TEE after EGD   - continue Heparin              - Eliquis 5mg  BID              - Metoprolol increased from 25 to 37.5mg  q6h for rate control  - GI consulted, appreciate recommendations:   - soft diet, then advance to heart healthy in am  - continue protonix 40mg  po twice daily for 12 wks , then reduce to 1 a day   - sucralfate 1g PO QID  For 2 wks   - resume heparin in 6 hours from EGD  - hold off on TEE for 24-48 hours  - Continuous cardiac telemetry  - f/u EGD biopsies    Delirium/Delusions  Early this morning patient became agitated and tore off her IV and cardiac monitors and started putting on her clothes threatening to leave AMA. When I examined patient this morning she was very pleasant and apologized for this morning stating she was having hallucinations and she didn't like the way the nurse was talking to her - continue to monitor   Hypertension, stable  Blood pressure on  admission 113/88, currently 102/69. Home med: Metoprolol 75mg  daily. - Continue metoprolol 37.5mg  q 6h  - Continue to monitor BP  Hypokalemia Potassium 3.3 yesterday. Pending this am  - Replete with 35mEq - BMP in the AM  Dehydration 2/2 Vomiting  AKI on CKD stage IIIB Patient reports recent vomiting with decreased oral intake and decreased urinary frequency.  CR of 1.62>1.45, GFR 33>38. Baseline Cr appears to be around 1.2-1.3. UA also showing dehydration and patient endorses decreased urinary frequency likely due to dehydration.  - IVF LR @75mL /h - Monitor creatinine on BMP daily  Parkinson's disease  Parkinson's related dysphagia On exam patient with significant resting tremors. Home med: Carbidopa-levodopa.  - Continue home Carbidopa-Levodopa five times daily  -F/u EGD  Type 2 diabetes Last HbA1c on 07/14/2019 was 5.7. Patient is currently diet controlled. Glucose this am pending - F/u HbA1c - Monitor BMP for glucose, consider adding on SSI if persistent hyperglycemia  Anxiety  Home med: Zoloft 100mg  daily  - Continue home zoloft   FEN/GI: LR @ 9mL/h; heart healthy diet, NPO at MN Prophylaxis: Will start full dose lovenox after CT scan if negative  Disposition: Cardiac-tele   Subjective:  Overnight patient removed her IV and and cardiac monitoring and threatened to leave AMA. Patients   Objective: Temp:  [97.6 F (36.4 C)-98.1 F (36.7 C)] 98.1 F (36.7 C) (03/31 0756)  Pulse Rate:  [77-100] 90 (03/31 0756) Resp:  [16-19] 18 (03/31 0756) BP: (107-157)/(68-112) 130/89 (03/31 0756) SpO2:  [92 %-100 %] 97 % (03/31 0655) Weight:  [74.5 kg-76.4 kg] 76.4 kg (03/31 0655) Physical Exam: General: alert, pleasant, NAD Cardiovascular: RR Respiratory: IRR no murmurs  Abdomen: soft, non distended  Extremities: warm, dry. No LE edema. Significant tremors of upper and lower extremities     Laboratory: Recent Labs  Lab 07/10/20 1828 07/11/20 0406 07/12/20 0231   WBC 8.0 6.4 5.4  HGB 13.2 12.2 11.5*  HCT 38.7 35.5* 34.7*  PLT 237 212 192   Recent Labs  Lab 07/10/20 1828 07/11/20 0406  NA 139 140  K 3.4* 3.3*  CL 105 105  CO2 27 28  BUN 22 19  CREATININE 1.62* 1.45*  CALCIUM 8.6* 8.7*  PROT 6.0*  --   BILITOT 0.6  --   ALKPHOS 60  --   ALT 7  --   AST 28  --   GLUCOSE 112* 98     Imaging/Diagnostic Tests:  DG ESOPHAGUS W SINGLE CM (SOL OR THIN BA)  Result Date: 07/11/2020 CLINICAL DATA:  Esophageal stricture. Additional history provided: Patient reports dysphagia with food and liquids. EXAM: ESOPHOGRAM / BARIUM SWALLOW / BARIUM TABLET STUDY TECHNIQUE: Single contrast examination performed using thick and thin barium liquid. The patient was observed with fluoroscopy swallowing a 13 mm barium sulphate tablet. FLUOROSCOPY TIME:  Fluoroscopy Time:  2 minutes, 48 seconds Radiation Exposure Index (if provided by the fluoroscopic device): 70.70 mGy Number of Acquired Spot Images: 1 COMPARISON:  Prior chest radiographs 09/29/2018 and earlier. Chest CT 08/31/2006. barium esophagram 12/19/2011. FINDINGS: The examination is somewhat limited due to the patient's inability to stand and limited patient mobility. The esophagus is patulous. However, there is a somewhat narrowed appearance at the level of the distal esophagus and GE junction. Additionally, a 13 mm barium tablet did not pass from the distal esophagus into the stomach despite a prolonged period of observation. Small sliding hiatal hernia. Moderate intermittent esophageal dysmotility. Moderate volume gastroesophageal reflux observed to the level of the mid/upper esophagus. IMPRESSION: Somewhat limited examination due to the patient's inability to stand and limited patient mobility. There is a narrowed appearance at the level of the distal esophagus and GE junction. Additionally, a 13 mm barium tablet did not pass from the distal esophagus into the stomach, despite a prolonged period of observation.  Findings are suspicious for a stricture at this site, and endoscopy is recommended for further evaluation. Otherwise patulous esophagus. Moderate intermittent esophageal dysmotility. Moderate-volume gastroesophageal reflux observed to the level of the mid/upper esophagus. Small sliding hiatal hernia. Electronically Signed   By: Kellie Simmering DO   On: 07/11/2020 10:10    Shary Key, DO 07/12/2020, 9:36 AM PGY-1, [place] Intern pager: [phone], text pages welcome

## 2020-07-12 NOTE — Anesthesia Preprocedure Evaluation (Addendum)
Anesthesia Evaluation  Patient identified by MRN, date of birth, ID band Patient awake    Reviewed: Allergy & Precautions, NPO status , Patient's Chart, lab work & pertinent test results, reviewed documented beta blocker date and time   History of Anesthesia Complications (+) history of anesthetic complications  Airway Mallampati: III  TM Distance: >3 FB Neck ROM: Full    Dental  (+) Dental Advisory Given, Poor Dentition, Missing, Caps   Pulmonary COPD,  COPD inhaler,    breath sounds clear to auscultation + decreased breath sounds      Cardiovascular hypertension, Pt. on medications and Pt. on home beta blockers + dysrhythmias Atrial Fibrillation  Rhythm:Irregular Rate:Normal  Pulmonary HTN   Neuro/Psych PSYCHIATRIC DISORDERS Anxiety Depression Dementia Parkinson's diseas  Neuromuscular disease negative psych ROS   GI/Hepatic Neg liver ROS, hiatal hernia, PUD, GERD  Medicated and Controlled,Esophageal stricture   Endo/Other  negative endocrine ROSdiabetes, Well Controlled, Type 2, Oral Hypoglycemic AgentsHyperlipidemia Gout  Renal/GU Renal InsufficiencyRenal disease  negative genitourinary   Musculoskeletal  (+) Arthritis , Osteoarthritis,  Scoliosis    Abdominal (+) + obese,   Peds  Hematology  (+) anemia , Eliquis therapy last dose this am   Anesthesia Other Findings   Reproductive/Obstetrics                            Anesthesia Physical  Anesthesia Plan  ASA: III  Anesthesia Plan: MAC and General   Post-op Pain Management:    Induction: Intravenous  PONV Risk Score and Plan: Propofol infusion and Treatment may vary due to age or medical condition  Airway Management Planned: Nasal Cannula, Natural Airway and Mask  Additional Equipment: None  Intra-op Plan:   Post-operative Plan:   Informed Consent: I have reviewed the patients History and Physical, chart, labs and  discussed the procedure including the risks, benefits and alternatives for the proposed anesthesia with the patient or authorized representative who has indicated his/her understanding and acceptance.     Dental advisory given  Plan Discussed with: CRNA and Anesthesiologist  Anesthesia Plan Comments:         Anesthesia Quick Evaluation

## 2020-07-13 ENCOUNTER — Encounter (HOSPITAL_COMMUNITY): Payer: Self-pay | Admitting: Gastroenterology

## 2020-07-13 ENCOUNTER — Other Ambulatory Visit: Payer: Self-pay | Admitting: Family Medicine

## 2020-07-13 DIAGNOSIS — K222 Esophageal obstruction: Secondary | ICD-10-CM | POA: Diagnosis not present

## 2020-07-13 DIAGNOSIS — Z20822 Contact with and (suspected) exposure to covid-19: Secondary | ICD-10-CM | POA: Diagnosis not present

## 2020-07-13 DIAGNOSIS — K3189 Other diseases of stomach and duodenum: Secondary | ICD-10-CM | POA: Diagnosis not present

## 2020-07-13 DIAGNOSIS — I4891 Unspecified atrial fibrillation: Secondary | ICD-10-CM | POA: Diagnosis not present

## 2020-07-13 DIAGNOSIS — K2100 Gastro-esophageal reflux disease with esophagitis, without bleeding: Secondary | ICD-10-CM | POA: Diagnosis not present

## 2020-07-13 DIAGNOSIS — I272 Pulmonary hypertension, unspecified: Secondary | ICD-10-CM | POA: Diagnosis not present

## 2020-07-13 DIAGNOSIS — R1310 Dysphagia, unspecified: Secondary | ICD-10-CM | POA: Diagnosis not present

## 2020-07-13 DIAGNOSIS — I10 Essential (primary) hypertension: Secondary | ICD-10-CM | POA: Diagnosis not present

## 2020-07-13 DIAGNOSIS — K449 Diaphragmatic hernia without obstruction or gangrene: Secondary | ICD-10-CM | POA: Diagnosis not present

## 2020-07-13 DIAGNOSIS — R079 Chest pain, unspecified: Secondary | ICD-10-CM | POA: Diagnosis not present

## 2020-07-13 DIAGNOSIS — I5033 Acute on chronic diastolic (congestive) heart failure: Secondary | ICD-10-CM | POA: Diagnosis not present

## 2020-07-13 LAB — HEPARIN LEVEL (UNFRACTIONATED): Heparin Unfractionated: 0.59 IU/mL (ref 0.30–0.70)

## 2020-07-13 LAB — CBC
HCT: 36.9 % (ref 36.0–46.0)
Hemoglobin: 12.3 g/dL (ref 12.0–15.0)
MCH: 31.8 pg (ref 26.0–34.0)
MCHC: 33.3 g/dL (ref 30.0–36.0)
MCV: 95.3 fL (ref 80.0–100.0)
Platelets: 196 10*3/uL (ref 150–400)
RBC: 3.87 MIL/uL (ref 3.87–5.11)
RDW: 13.5 % (ref 11.5–15.5)
WBC: 5.3 10*3/uL (ref 4.0–10.5)
nRBC: 0 % (ref 0.0–0.2)

## 2020-07-13 MED ORDER — METOPROLOL TARTRATE 75 MG PO TABS: 75.0000 mg | ORAL_TABLET | Freq: Two times a day (BID) | ORAL | 0 refills | Status: DC

## 2020-07-13 MED ORDER — METOPROLOL TARTRATE 50 MG PO TABS
75.0000 mg | ORAL_TABLET | Freq: Two times a day (BID) | ORAL | Status: DC
  Filled 2020-07-13: qty 1

## 2020-07-13 MED ORDER — SERTRALINE HCL 100 MG PO TABS: 100.0000 mg | ORAL_TABLET | Freq: Every day | ORAL | 0 refills | Status: DC

## 2020-07-13 MED ORDER — APIXABAN 5 MG PO TABS: 5.0000 mg | ORAL_TABLET | Freq: Two times a day (BID) | ORAL | 0 refills | Status: DC

## 2020-07-13 MED ORDER — ACETAMINOPHEN 325 MG PO TABS: 650.0000 mg | ORAL_TABLET | ORAL | Status: DC | PRN

## 2020-07-13 MED ORDER — APIXABAN 5 MG PO TABS
5.0000 mg | ORAL_TABLET | Freq: Two times a day (BID) | ORAL | Status: DC
  Administered 2020-07-13: 5 mg via ORAL
  Filled 2020-07-13: qty 1

## 2020-07-13 MED ORDER — METOPROLOL TARTRATE 25 MG PO TABS
37.5000 mg | ORAL_TABLET | Freq: Once | ORAL | Status: AC
  Administered 2020-07-13: 37.5 mg via ORAL
  Filled 2020-07-13: qty 1

## 2020-07-13 MED ORDER — SUCRALFATE 1 GM/10ML PO SUSP: 1.0000 g | Freq: Three times a day (TID) | ORAL | 3 refills | Status: DC

## 2020-07-13 NOTE — Progress Notes (Signed)
FMTS Attending Daily Note: Mallory Savannah, MD  Team Pager [phone] Pager [phone] I have seen and examined this patient, reviewed their chart. I have discussed this patient with the resident.  This morning, Ms Dino notes she is doing well, tolerating PO. Her throat is a little sore but denies trouble swallowing. Denies vomiting, abdominal pain. She denies any chest pain or shortness of breath or palpitations. She is hopeful to go home today.  Vitals:   07/13/20 0446 07/13/20 1044  BP: (!) 141/94 (!) 149/67  Pulse: 90 91  Resp: 20   Temp: 98.4 F (36.9 C)   SpO2: 100%     On exam, heart is irregular rhythm, not tachycardic. Lungs CTAB. Abd soft, no guarding or rebound.   A/P: Atrial fibrillation- appreciate cardiology recs, transitioned to Eliquis 5mg  BID and metoprolol 75mg  BID. Will follow up with cardiology outpatient for DCCV. Dysphagia with esophageal stricture s/p dilation- continue BID PPI, sucralfate QID. Hgb stable after dilation. HTN- stable AKI on CKD 3b- creatinine stable and at baseline this AM.  Stable for discharge, will follow up in our clinic and with cardiology.  Resident note to follow.

## 2020-07-13 NOTE — Progress Notes (Signed)
Physical Therapy Treatment Patient Details Name: [name] MRN: [id number] DOB: [birth year] Today's Date: 07/13/2020    History of Present Illness 75 y.o. female presenting as direct admit from family medicine appointment where she was found to be in Afib with RVR. Pt with reports of several weeks of chest pain and SOB. Pt also reports a recent fall with, endorses hitting her head, PMH is significant for hypertension, pulmonary hypertension, DM2, hyperlipidemia, CKD stage III, Parkinson's disease.    PT Comments    Pt doing well with mobility and no further acute PT needed.  Ready for dc from PT standpoint. Further therapy with OPPT.      Follow Up Recommendations  Outpatient PT;Supervision for mobility/OOB (family prefers outpatient neuro rehab 2/2 parkinsons)     Equipment Recommendations  None recommended by PT    Recommendations for Other Services       Precautions / Restrictions Precautions Precautions: None Precaution Comments: Pt complained of dizziness with sitting EOB and Standing. BP stable, HR showed signs of Vtach. Restrictions Weight Bearing Restrictions: No    Mobility  Bed Mobility Overal bed mobility: Independent Bed Mobility: Supine to Sit;Sit to Supine     Supine to sit: Independent Sit to supine: Independent   General bed mobility comments: Pt HOB was flat, pt able to complete bed mobility with no assist.    Transfers Overall transfer level: Modified independent Equipment used: 4-wheeled walker;None Transfers: Sit to/from Stand Sit to Stand: Modified independent (Device/Increase time)         General transfer comment: Able to rise from low commode without use of hands  Ambulation/Gait Ambulation/Gait assistance: Modified independent (Device/Increase time) Gait Distance (Feet): 300 Feet Assistive device: 4-wheeled walker;None Gait Pattern/deviations: Step-through pattern Gait velocity: reduced Gait velocity interpretation: >2.62  ft/sec, indicative of community ambulatory General Gait Details: Steady gait with rollator   Stairs             Wheelchair Mobility    Modified Rankin (Stroke Patients Only)       Balance Overall balance assessment: Needs assistance Sitting-balance support: No upper extremity supported;Feet supported Sitting balance-Leahy Scale: Good     Standing balance support: No upper extremity supported Standing balance-Leahy Scale: Good Standing balance comment: Standing at sink, pt was able to complete grooming activity with intermittent unilateral UE support and no UE support. When static standing at EOB pt was reliant on bilateral UE support.                            Cognition Arousal/Alertness: Awake/alert Behavior During Therapy: WFL for tasks assessed/performed Overall Cognitive Status: Impaired/Different from baseline Area of Impairment: Memory;Safety/judgement                     Memory: Decreased short-term memory   Safety/Judgement: Decreased awareness of safety Awareness: Emergent   General Comments: Pt demonstrated difficulty with use of RW, required max verbal cues for safe use of RW and educated on fall prevention.      Exercises      General Comments General comments (skin integrity, edema, etc.): HR 109 with activiey      Pertinent Vitals/Pain Pain Assessment: No/denies pain Pain Score: 6  Pain Location: Neck and back Pain Descriptors / Indicators: Aching;Stabbing Pain Intervention(s): Monitored during session;Repositioned;Patient requesting pain meds-RN notified    Home Living  Prior Function            PT Goals (current goals can now be found in the care plan section) Acute Rehab PT Goals Patient Stated Goal: go home Progress towards PT goals: Goals met/education completed, patient discharged from PT    Frequency           PT Plan Current plan remains appropriate    Co-evaluation               AM-PAC PT "6 Clicks" Mobility   Outcome Measure  Help needed turning from your back to your side while in a flat bed without using bedrails?: None Help needed moving from lying on your back to sitting on the side of a flat bed without using bedrails?: None Help needed moving to and from a bed to a chair (including a wheelchair)?: None Help needed standing up from a chair using your arms (e.g., wheelchair or bedside chair)?: None Help needed to walk in hospital room?: None Help needed climbing 3-5 steps with a railing? : A Little 6 Click Score: 23    End of Session Equipment Utilized During Treatment: Gait belt Activity Tolerance: Patient tolerated treatment well Patient left: in bed;with call bell/phone within reach;with family/visitor present Nurse Communication: Mobility status PT Visit Diagnosis: Unsteadiness on feet (R26.81);Other abnormalities of gait and mobility (R26.89);History of falling (Z91.81)     Time: 1127-1140 PT Time Calculation (min) (ACUTE ONLY): 13 min  Charges:  $Gait Training: 8-22 mins                       PT Acute Rehabilitation Services Pager [phone] Office [phone]     W Maycok 07/13/2020, 12:07 PM   

## 2020-07-13 NOTE — Progress Notes (Signed)
ANTICOAGULATION CONSULT NOTE - Follow Up Consult  Pharmacy Consult for IV heparin >> Eliquis Indication: atrial fibrillation  Allergies  Allergen Reactions  . Ace Inhibitors Anaphylaxis and Swelling  . Azilect [Rasagiline Mesylate] Other (See Comments)    hypotension  . Lisinopril Swelling    Severe facial angioedema requiring hospitalization 2007 (approx)    Patient Measurements: Height: 5\' 4"  (162.6 cm) Weight: 77.3 kg (170 lb 6.7 oz) IBW/kg (Calculated) : 54.7  Heparin dosing weight: 76.4 kg  Vital Signs: Temp: 98.4 F (36.9 C) (04/01 0446) Temp Source: Oral (04/01 0446) BP: 141/94 (04/01 0446) Pulse Rate: 90 (04/01 0446)  Labs: Recent Labs    07/10/20 1828 07/10/20 2122 07/11/20 0406 07/11/20 2034 07/12/20 0231 07/12/20 1243 07/13/20 0304  HGB 13.2  --  12.2  --  11.5*  --  12.3  HCT 38.7  --  35.5*  --  34.7*  --  36.9  PLT 237  --  212  --  192  --  196  HEPARINUNFRC  --   --   --  0.56 0.66  --  0.59  CREATININE 1.62*  --  1.45*  --   --  1.23*  --   TROPONINIHS 135* 87*  --   --   --   --   --     Estimated Creatinine Clearance: 39.7 mL/min (A) (by C-G formula based on SCr of 1.23 mg/dL (H)).   Assessment: 74YOF with new diagnosis of atrial fibrillation. CHADSVASC 6 indicates high risk of stroke. Patient was on IV heparin with original plan for DCCV per cards.  No longer pursuing inpatient DCCV - will anticoagulate x 3 weeks then plan for outpatient procedure.  GI ok to start Eliquis.  Pharmacy consulted to dose Eliquis.  CBC stable.    Goal of Therapy:  Heparin level 0.3-0.7 units/ml Monitor platelets by anticoagulation protocol: Yes   Plan:  Discontinue heparin drip Start Eliquis 5mg  BID at time of heparin discontinuation Monitor daily CBC, s/sx bleeding  Dimple Nanas, PharmD PGY-1 Acute Care Pharmacy Resident 07/13/2020 9:00 AM

## 2020-07-13 NOTE — Progress Notes (Signed)
Occupational Therapy Treatment Patient Details Name: Leeanna Slaby MRN: [id number] DOB: 1945-07-21 Today's Date: 07/13/2020    History of present illness 75 y.o. female presenting as direct admit from family medicine appointment where she was found to be in Afib with RVR. Pt with reports of several weeks of chest pain and SOB. Pt also reports a recent fall with, endorses hitting her head, PMH is significant for hypertension, pulmonary hypertension, DM2, hyperlipidemia, CKD stage III, Parkinson's disease.   OT comments  Pt received supine in bed, husband in room, agreeable to OT session. Pt reported pain in neck and back, and dizziness with initial sitting and standing throughout this session. Pt BP remained stable around 132/70, and HR displayed some Vtach, with highest HR noted at 129bpm. PT demonstrated decreased safety awareness with use of RW for functional mobility, requiring mod verbal cueing on safe usage. Pt completed grooming at this sink with min guard for safety and donned socks sitting EOB with min guard for balance. Pt d/c plan updated to Out Patient Neuro OT due to increased strength and activity tolerance, as well as need for higher level of OT due to Parkinson's disease. Pt will continue to benefit from acute OT to address the concerns listed below.    Follow Up Recommendations  Outpatient OT ([facility] for Parkinson's)    Equipment Recommendations  None recommended by OT    Recommendations for Other Services      Precautions / Restrictions Precautions Precautions: Fall Precaution Comments: Pt complained of dizziness with sitting EOB and Standing. BP stable, HR showed signs of Vtach. Restrictions Weight Bearing Restrictions: No       Mobility Bed Mobility Overal bed mobility: Independent Bed Mobility: Supine to Sit;Sit to Supine     Supine to sit: Independent Sit to supine: Independent   General bed mobility comments: Pt HOB was flat, pt able to complete bed  mobility with no assist.    Transfers Overall transfer level: Needs assistance Equipment used: Rolling walker (2 wheeled) Transfers: Sit to/from Stand Sit to Stand: Min guard         General transfer comment: Verbal cues for safe use of RW.    Balance Overall balance assessment: Needs assistance Sitting-balance support: No upper extremity supported;Feet supported Sitting balance-Leahy Scale: Fair     Standing balance support: Bilateral upper extremity supported;Single extremity supported;During functional activity (static standing- bilateral support, dynamic standing for activity unilateral/no support.) Standing balance-Leahy Scale: Fair Standing balance comment: Standing at sink, pt was able to complete grooming activity with intermittent unilateral UE support and no UE support. When static standing at EOB pt was reliant on bilateral UE support.                           ADL either performed or assessed with clinical judgement   ADL Overall ADL's : Needs assistance/impaired     Grooming: Wash/dry hands;Wash/dry face;Min guard;Standing Grooming Details (indicate cue type and reason): Pt standing at sink level for this activity, able to maintain balance while completing grooming with intermittent no UE support and unilateral UE support.             Lower Body Dressing: Min guard;Sitting/lateral leans Lower Body Dressing Details (indicate cue type and reason): Pt sat EOB and fixed her socks on her feet using figure 4 position.             Functional mobility during ADLs: Min guard;Rolling walker General ADL Comments: Pt  ambulated to the sink with RW, requiring cues for safety and use of safe ambulation with RW. Pt overall needed min guard for safety and balance. Pt complained of dizziness when initally standing, BP and symptoms monitorred throughout.     Vision       Perception     Praxis      Cognition Arousal/Alertness: Awake/alert Behavior  During Therapy: WFL for tasks assessed/performed Overall Cognitive Status: Impaired/Different from baseline Area of Impairment: Safety/judgement;Awareness                         Safety/Judgement: Decreased awareness of safety Awareness: Emergent   General Comments: Pt demonstrated difficulty with use of RW, required max verbal cues for safe use of RW and educated on fall prevention.        Exercises     Shoulder Instructions       General Comments Pt displayed vtach throughout session, unsure if it was artifact or true Vtach, nursing notified. Pt also complained of dizziness when sitting initially and standing initially, BP was checked and stable, nursing notified.    Pertinent Vitals/ Pain       Pain Assessment: 0-10 Pain Score: 6  Pain Location: Neck and back Pain Descriptors / Indicators: Aching;Stabbing Pain Intervention(s): Monitored during session;Repositioned;Patient requesting pain meds-RN notified  Home Living                                          Prior Functioning/Environment              Frequency  Min 2X/week        Progress Toward Goals  OT Goals(current goals can now be found in the care plan section)  Progress towards OT goals: Progressing toward goals  Acute Rehab OT Goals Patient Stated Goal: to go home today OT Goal Formulation: With patient/family Time For Goal Achievement: 07/20/20 Potential to Achieve Goals: Good ADL Goals Pt Will Perform Upper Body Dressing: with modified independence;sitting;standing Pt Will Perform Lower Body Dressing: with modified independence;sit to/from stand Pt Will Transfer to Toilet: with modified independence;ambulating;grab bars Pt Will Perform Tub/Shower Transfer: with set-up;grab bars;shower seat;ambulating;rolling walker  Plan Discharge plan needs to be updated;Frequency remains appropriate    Co-evaluation                 AM-PAC OT "6 Clicks" Daily Activity      Outcome Measure   Help from another person eating meals?: None Help from another person taking care of personal grooming?: A Little Help from another person toileting, which includes using toliet, bedpan, or urinal?: A Little Help from another person bathing (including washing, rinsing, drying)?: A Little Help from another person to put on and taking off regular upper body clothing?: None Help from another person to put on and taking off regular lower body clothing?: A Little 6 Click Score: 20    End of Session Equipment Utilized During Treatment: Gait belt;Rolling walker  OT Visit Diagnosis: Unsteadiness on feet (R26.81);Repeated falls (R29.6);Muscle weakness (generalized) (M62.81)   Activity Tolerance Patient tolerated treatment well   Patient Left in bed;with call bell/phone within reach;with bed alarm set;with family/visitor present   Nurse Communication Mobility status;Other (comment) (nurisng notified of pt pain level, and vitals throughout session.)        Time: 1517-6160 OT Time Calculation (min): 25 min  Charges: OT General  Charges $OT Visit: 1 Visit OT Treatments $Self Care/Home Management : 23-37 mins  Chantil Bari H., OTR/L Acute Rehabilitation   Sereena Marando Elane Yolanda Bonine 07/13/2020, 10:47 AM

## 2020-07-13 NOTE — Care Management (Signed)
07-13-20 1000 Case Manager received referral for Eliquis cost. Benefits check submitted and Case Manager will follow for cost. Graves-Bigelow, Ocie Cornfield, RN,BSN Case Manager

## 2020-07-13 NOTE — Discharge Summary (Addendum)
[facility] Discharge Summary  [name] Medical record number: [id number] Date of birth: 1945/06/18 Age: 75 y.o. Gender: female Date of Admission: 07/10/2020  Date of Discharge: 07/13/2020 Admitting Physician: Gladys Damme, MD  Primary Care Provider: [name], MD Consultants: Cardiology, GI  Indication for Hospitalization: New-onset A. Fib and dysphagia  Discharge Diagnoses/Problem List:  New onset A. fib with RVR Chest pain HFpEF Dysphagia secondary to esophageal stricture Hypertension Parkinson's disease Anxiety  Disposition: Home  Discharge Condition: Stable  Discharge Exam:  Temp:  [98.4 F (36.9 C)] 98.4 F (36.9 C) (04/01 0446) Pulse Rate:  [89-91] 91 (04/01 1044) Resp:  [20] 20 (04/01 0446) BP: (118-149)/(66-94) 149/67 (04/01 1044) SpO2:  [98 %-100 %] 100 % (04/01 0446) Weight:  [77.3 kg] 77.3 kg (04/01 0446)  Physical Exam: General: Awake, alert, no acute distress Cardiovascular: Irregularly irregular rhythm at regular rate (70s), no murmur appreciated Respiratory: CTA B Extremities: Pill rolling tremor in both hands  Brief Hospital Course:   Pt Overview and Major Events to Date:  3/29 admitted 3/30 barium swallow 3/31 upper endoscopy with esophageal stricture dilation   Assessment and Plan: Ms. Mesch is a 75 year old female who presented with new onset A. fib with RVR and dysphagia secondary to esophageal strictures.  PMH includes Parkinson's disease, HTN, HFpEF, pulmonary HTN, T2DM, HLD, CKD stage III.   New onset A. Fib w/ RVR  Chest pain  NSTEMI Hx of HFpEF Admitted directly from clinic with new-onset A. fib and chest pain.  Started on diltiazem drip and treatment dose Lovenox.  Cardiology consulted, consider risk benefits of TEE with DCCV.  Cardiology ultimately declined in favor of following up outpatient in 3 weeks for TEE with possible cardioversion at that time.  She was discharged  on metoprolol and Eliquis.  Heart rate was controlled on discharge with resolution of chest pain.   Dysphagia Admitted directly from clinic for new onset A. fib as above and also worsening dysphagia.  GI consulted, performed barium swallow 3/30 which demonstrated multiple esophageal strictures.  On 3/31, underwent EGD with esophageal stricture dilation.  Biopsies obtained, GI planning to follow-up on those.  Discharged on soft diet and Protonix 40 mg twice daily x12 weeks.  Also instructed to take sucralfate 1 g before every meal and at bedtime x2 weeks.  GI to coordinate outpatient follow-up.   Issues for Follow Up:  - F/u dark stools outpatient, ensure cessation after stopping pepto-bismol - Rate controlled A. fib with metoprolol at time of discharge. No TEE cardioversion while inpatient due to esophageal stricture dilation during this stay.  - confirm that patient has follow up with cardiology outpatient in 3 weeks for TEE with possible cardioversion.  - Follow up with GI in 12 weeks s/p discharge. (around July 2022) - S/p EGD with esophageal stricture dilation.  Ensure tolerance of soft diet and adherence to Protonix and sucralfate as prescribed (detailed above). -Follow-up EGD biopsies from 3/31.  Significant Procedures: EGD with stricture dilation 07/12/2020  Significant Labs and Imaging:  Recent Labs  Lab 07/11/20 0406 07/12/20 0231 07/13/20 0304  WBC 6.4 5.4 5.3  HGB 12.2 11.5* 12.3  HCT 35.5* 34.7* 36.9  PLT 212 192 196   Recent Labs  Lab 07/10/20 1828 07/11/20 0406 07/12/20 1243  NA 139 140 139  K 3.4* 3.3* 4.5  CL 105 105 107  CO2 27 28 26   GLUCOSE 112* 98 90  BUN 22 19 11   CREATININE 1.62* 1.45*  1.23*  CALCIUM 8.6* 8.7* 9.0  MG 1.9  --   --   ALKPHOS 60  --   --   AST 28  --   --   ALT 7  --   --   ALBUMIN 3.6  --   --     CT HEAD WITHOUT CONTRAST 07/10/2020 TECHNIQUE: Contiguous axial images were obtained from the base of the skull through the vertex  without intravenous contrast. COMPARISON:  12/06/2013 FINDINGS: Brain: Mild atrophic changes and chronic white matter ischemic changes are noted. The white matter changes have progressed in the interval from the prior exam. No findings to suggest acute hemorrhage, acute infarction or space-occupying mass lesion are seen. Vascular: No hyperdense vessel or unexpected calcification. Skull: Normal. Negative for fracture or focal lesion. Sinuses/Orbits: No acute finding. Other: None. IMPRESSION: Chronic changes without acute abnormality.  ESOPHOGRAM / BARIUM SWALLOW / BARIUM TABLET STUDY 07/11/2020 TECHNIQUE: Single contrast examination performed using thick and thin barium liquid. The patient was observed with fluoroscopy swallowing a 13 mm barium sulphate tablet. FLUOROSCOPY TIME:  Fluoroscopy Time:  2 minutes, 48 seconds Radiation Exposure Index (if provided by the fluoroscopic device): 70.70 mG Number of Acquired Spot Images: 1 COMPARISON:  Prior chest radiographs 09/29/2018 and earlier. Chest CT 08/31/2006. barium esophagram 12/19/2011. FINDINGS: The examination is somewhat limited due to the patient's inability to stand and limited patient mobility. The esophagus is patulous. However, there is a somewhat narrowed appearance at the level of the distal esophagus and GE junction. Additionally, a 13 mm barium tablet did not pass from the distal esophagus into the stomach despite a prolonged period of observation. Small sliding hiatal hernia. Moderate intermittent esophageal dysmotility. Moderate volume gastroesophageal reflux observed to the level of the mid/upper esophagus. IMPRESSION: (1) Somewhat limited examination due to the patient's inability to stand and limited patient mobility. (2) There is a narrowed appearance at the level of the distal esophagus and GE junction. Additionally, a 13 mm barium tablet did not pass from the distal esophagus into the stomach, despite a  prolonged period of observation. Findings are suspicious for a stricture at this site, and endoscopy is recommended for further evaluation. (3) Otherwise patulous esophagus. (4) Moderate intermittent esophageal dysmotility. (5) Moderate-volume gastroesophageal reflux observed to the level of the mid/upper esophagus. (6) Small sliding hiatal hernia.  Upper Endoscopy with stricture dilation 07/12/2020 with Dr. Jackquline Denmark Findings: (1) Mid and distal esophagus was mildly tortuous. LA Grade D esophagitis with circumferential non-bleeding ulcers was found 35 to 38 cm from the incisors. There was distal esophageal stricture with luminal diameter of approximately 12 mm at [place] 38 cm. Biopsies were taken with a cold forceps for histology. A TTS dilator was passed through the scope. Dilation with a 12-13.5-15 mm balloon dilator was performed to 15 mm. The dilation site was examined and showed mild mucosal disruption and moderate improvement. Estimated blood loss was minimal. (2) A 2 cm hiatal hernia was present. (3) Patchy mildly erythematous mucosa without bleeding was found in the gastric antrum.  (4) Biopsies were taken with a cold forceps for histology. (5) The examined duodenum was normal. Impression: LA Grade D reflux esophagitis with distal esophageal stricture. Biopsied. Dilated. - 2 cm hiatal hernia. - Mild gastritis. Recommendation: - Soft diet today, then can advance to heart healthy diet in a.m. - Continue present medications including Protonix 40 mg p.o. twice daily x 12 weeks, then can reduce it to once a day. - Use sucralfate suspension 1  gram PO QID (qAC and qHS) for 2 weeks. - Return to GI clinic in 6-8 weeks. If still with dysphagia, would require further esophageal dilatation. - Resume heparin in 6 hours. - Hold off on TEE for at least 24-48 hours, if possible. D/W Dr [name]. - The findings and recommendations were discussed with the patient's  family.  Results/Tests Pending at Time of Discharge: None  Discharge Medications:  Allergies as of 07/13/2020       Reactions   Ace Inhibitors Anaphylaxis, Swelling   Azilect [rasagiline Mesylate] Other (See Comments)   hypotension   Lisinopril Swelling   Severe facial angioedema requiring hospitalization 2007 (approx)        Medication List     STOP taking these medications    metoprolol succinate 50 MG 24 hr tablet Commonly known as: TOPROL-XL       TAKE these medications    acetaminophen 325 MG tablet Commonly known as: TYLENOL Take 2 tablets (650 mg total) by mouth every 4 (four) hours as needed for headache or mild pain.   apixaban 5 MG Tabs tablet Commonly known as: ELIQUIS Take 1 tablet (5 mg total) by mouth 2 (two) times daily.   carbidopa-levodopa 25-100 MG tablet Commonly known as: SINEMET IR Take 1 tablet by mouth 5 (five) times daily.   gabapentin 300 MG capsule Commonly known as: NEURONTIN Take 300-600 mg by mouth See admin instructions. Take  2 capsules in the morning and 2 capsules at lunch 3 capsule in the evening as needed for pain   loperamide 2 MG tablet Commonly known as: IMODIUM A-D Take 2 mg by mouth 4 (four) times daily as needed for diarrhea or loose stools.   nystatin-triamcinolone ointment Commonly known as: MYCOLOG Apply 1 application topically 3 (three) times daily.   omeprazole 40 MG capsule Commonly known as: PRILOSEC TAKE 1 CAPSULE BY MOUTH TWICE DAILY What changed:  how much to take how to take this when to take this additional instructions   ondansetron 4 MG tablet Commonly known as: ZOFRAN Take 1 tablet (4 mg total) by mouth every 8 (eight) hours as needed for nausea or vomiting.   sertraline 100 MG tablet Commonly known as: ZOLOFT Take 1 tablet (100 mg total) by mouth daily. Per psych What changed: how much to take   sucralfate 1 GM/10ML suspension Commonly known as: CARAFATE Take 10 mLs (1 g total) by mouth 4  (four) times daily -  with meals and at bedtime.   traMADol 50 MG tablet Commonly known as: ULTRAM TAKE 1 TO 2 TABLETS BY MOUTH TWICE DAILY AS NEEDED FOR CHRONIC KNEE PAIN What changed:  how much to take how to take this when to take this reasons to take this additional instructions        Discharge Instructions: Please refer to Patient Instructions section of EMR for full details.  Patient was counseled important signs and symptoms that should prompt return to medical care, changes in medications, dietary instructions, activity restrictions, and follow up appointments.   Follow-Up Appointments:  Follow-up Information     [name], MD. Go on 07/20/2020.   Specialties: Family Medicine, Sports Medicine Why: @9 :30am. This will be your hospital follow up appointment with your primary care clinic. It will be with Dr. Darrelyn Hillock.  Contact information: [address] [place] [address] [phone]         Sueanne Margarita, MD .   Specialty: Cardiology Contact information: [phone] [address]  [address] [place] [address] [phone]         Jackquline Denmark, MD. Go in 12 week(s).   Specialties: Gastroenterology, Internal Medicine Why: Follow up with the GI (gastroenterology) clinic in 12 weeks (3 months) after discharge. Please call their office to make an appointment.  Contact information: [address] [place] [address] [phone]                 Ezequiel Essex, MD 07/15/2020, 8:26 PM PGY-1, [place]      My edits for correction/addition/clarification are included above. Please see any attending notes.   Eulis Foster, MD PGY-2, [facility] 07/15/2020 10:07 PM  FPTS Service pager: [phone] (text pages welcome through [facility])

## 2020-07-13 NOTE — Progress Notes (Signed)
Family Medicine Teaching Service Daily Progress Note Intern Pager: [phone]  [name] Medical record number: [id number] Date of birth: 1946-02-25 Age: 75 y.o. Gender: female  Primary Care Provider: [name], MD Consultants: Cardiology, GI Code Status: Full  Pt Overview and Major Events to Date:  3/29 admitted 3/30 barium swallow 3/31 upper endoscopy with esophageal stricture dilation  Assessment and Plan: [name] is a 75 year old female who presented with new onset A. fib with RVR and dysphagia secondary to esophageal strictures.  PMH includes Parkinson's disease, HTN, HFpEF, pulmonary HTN, T2DM, HLD, CKD stage III.   New onset A. Fib w/ RVR  Chest pain  Hx of HFpEF Rate controlled A. fib, max rate 104.  Tolerating metoprolol p.o.  Eliquis started today.  Cardiology consulted, declines inpatient TEE, will do as outpatient in 3 weeks. -Continue metoprolol -Continue Eliquis -Continuous cardiac telemetry  Dysphagia S/p upper endoscopy with esophageal stricture dilation yesterday.  Doing well since. -Continue soft diet -Follow-up biopsies from procedure -GI outpatient follow-up -Protonix 40 mg twice daily x12 weeks, then reduce to once daily -Sucralfate 1 g before every meal, nightly for 2 weeks  Hypertension, stable  Currently normotensive.  We will continue to monitor.  Parkinson's disease  Parkinson's related dysphagia On exam patient with significant resting tremors. Home med: Carbidopa-levodopa.  - Continue home Carbidopa-Levodopa five times daily  -F/u EGD  Anxiety  Home med: Zoloft 100mg  daily  - Continue home zoloft  FEN/GI: Soft diet PPx: Heparin   Subjective:  Patient found laying in bed comfortably, with husband at bedside.  Patient reports feeling well overnight, tolerating medications well.  No chest pain, no complaints at all.  Feels well is looking forward to going home.  Plan for discharge.  Objective: Temp:   [98.4 F (36.9 C)] 98.4 F (36.9 C) (04/01 0446) Pulse Rate:  [89-91] 91 (04/01 1044) Resp:  [20] 20 (04/01 0446) BP: (118-149)/(66-94) 149/67 (04/01 1044) SpO2:  [98 %-100 %] 100 % (04/01 0446) Weight:  [77.3 kg] 77.3 kg (04/01 0446) Physical Exam: General: Awake, alert, no acute distress Cardiovascular: Irregularly irregular rhythm at regular rate (70s), no murmur appreciated Respiratory: CTA B Extremities: Pill rolling tremor in both hands  Laboratory: Recent Labs  Lab 07/11/20 0406 07/12/20 0231 07/13/20 0304  WBC 6.4 5.4 5.3  HGB 12.2 11.5* 12.3  HCT 35.5* 34.7* 36.9  PLT 212 192 196   Recent Labs  Lab 07/10/20 1828 07/11/20 0406 07/12/20 1243  NA 139 140 139  K 3.4* 3.3* 4.5  CL 105 105 107  CO2 27 28 26   BUN 22 19 11   CREATININE 1.62* 1.45* 1.23*  CALCIUM 8.6* 8.7* 9.0  PROT 6.0*  --   --   BILITOT 0.6  --   --   ALKPHOS 60  --   --   ALT 7  --   --   AST 28  --   --   GLUCOSE 112* 98 90     Imaging/Diagnostic Tests: None last 24 hours.   Mallory Essex, MD 07/13/2020, 9:48 PM PGY-1, [place] Intern pager: [phone], text pages welcome

## 2020-07-13 NOTE — Discharge Instructions (Signed)

## 2020-07-13 NOTE — Progress Notes (Signed)
Progress Note  [name] Date of Encounter: 07/13/2020  [facility] Cardiologist: Fransico Him, MD   Subjective   Denies any chest pain or SOB.  Not combative today.  Remains in afib with CVR.  EGD yesterday with esophagitis with distal esophageal stricture that was biopsied and mild gastritis.    Inpatient Medications    Scheduled Meds: . carbidopa-levodopa  1 tablet Oral 5 X Daily  . fluticasone  2 spray Each Nare Daily  . gabapentin  200 mg Oral BID WC  . gabapentin  400 mg Oral QHS  . metoprolol tartrate  37.5 mg Oral Q6H  . pantoprazole  40 mg Oral BID AC  . sertraline  100 mg Oral Daily  . sucralfate  1 g Oral Q6H   Continuous Infusions: . heparin 1,100 Units/hr (07/12/20 1753)   PRN Meds: acetaminophen   Vital Signs    Vitals:   07/12/20 1643 07/12/20 2016 07/12/20 2330 07/13/20 0446  BP: 95/61 101/68 118/66 (!) 141/94  Pulse: 91 93 89 90  Resp: 18 19  20   Temp: 98 F (36.7 C) 98 F (36.7 C)  98.4 F (36.9 C)  TempSrc: Oral Oral  Oral  SpO2: 95% 98% 98% 100%  Weight:    77.3 kg  Height:        Intake/Output Summary (Last 24 hours) at 07/13/2020 0758 Last data filed at 07/12/2020 1800 Gross per 24 hour  Intake 1197.66 ml  Output --  Net 1197.66 ml   Last 3 Weights 07/13/2020 07/12/2020 07/12/2020  Weight (lbs) 170 lb 6.7 oz 168 lb 8 oz 164 lb 3.2 oz  Weight (kg) 77.3 kg 76.431 kg 74.481 kg      Telemetry    Atrial fibrillation with CVR in the 80's - Personally Reviewed  ECG  No new EKG to review - Personally Reviewed  Physical Exam   GEN: ill appearing HEENT: Normal NECK: No JVD; No carotid bruits LYMPHATICS: No lymphadenopathy CARDIAC:irregularly irregular, no murmurs, rubs, gallops RESPIRATORY:  Clear to auscultation without rales, wheezing or rhonchi  ABDOMEN: Soft, non-tender, non-distended MUSCULOSKELETAL:  No edema; No deformity  SKIN: Warm and dry NEUROLOGIC:  Alert and oriented x 3 PSYCHIATRIC:  Normal  affect    Labs    High Sensitivity Troponin:   Recent Labs  Lab 07/10/20 1828 07/10/20 2122  TROPONINIHS 135* 87*      Chemistry Recent Labs  Lab 07/10/20 1828 07/11/20 0406 07/12/20 1243  NA 139 140 139  K 3.4* 3.3* 4.5  CL 105 105 107  CO2 27 28 26   GLUCOSE 112* 98 90  BUN 22 19 11   CREATININE 1.62* 1.45* 1.23*  CALCIUM 8.6* 8.7* 9.0  PROT 6.0*  --   --   ALBUMIN 3.6  --   --   AST 28  --   --   ALT 7  --   --   ALKPHOS 60  --   --   BILITOT 0.6  --   --   GFRNONAA 33* 38* 46*  ANIONGAP 7 7 6      Hematology Recent Labs  Lab 07/11/20 0406 07/12/20 0231 07/13/20 0304  WBC 6.4 5.4 5.3  RBC 3.75* 3.57* 3.87  HGB 12.2 11.5* 12.3  HCT 35.5* 34.7* 36.9  MCV 94.7 97.2 95.3  MCH 32.5 32.2 31.8  MCHC 34.4 33.1 33.3  RDW 13.5 13.5 13.5  PLT 212 192 196    BNP Recent Labs  Lab 07/10/20 1821  BNP  507.7*     DDimer No results for input(s): DDIMER in the last 168 hours.   CHA2DS2-VASc Score = 5  This indicates a 7.2% annual risk of stroke. The patient's score is based upon: CHF History: No HTN History: Yes Diabetes History: Yes Stroke History: No Vascular Disease History: No Age Score: 2 Gender Score: 1   Radiology    DG ESOPHAGUS W SINGLE CM (SOL OR THIN BA)  Result Date: 07/11/2020 CLINICAL DATA:  Esophageal stricture. Additional history provided: Patient reports dysphagia with food and liquids. EXAM: ESOPHOGRAM / BARIUM SWALLOW / BARIUM TABLET STUDY TECHNIQUE: Single contrast examination performed using thick and thin barium liquid. The patient was observed with fluoroscopy swallowing a 13 mm barium sulphate tablet. FLUOROSCOPY TIME:  Fluoroscopy Time:  2 minutes, 48 seconds Radiation Exposure Index (if provided by the fluoroscopic device): 70.70 mGy Number of Acquired Spot Images: 1 COMPARISON:  Prior chest radiographs 09/29/2018 and earlier. Chest CT 08/31/2006. barium esophagram 12/19/2011. FINDINGS: The examination is somewhat limited due to  the patient's inability to stand and limited patient mobility. The esophagus is patulous. However, there is a somewhat narrowed appearance at the level of the distal esophagus and GE junction. Additionally, a 13 mm barium tablet did not pass from the distal esophagus into the stomach despite a prolonged period of observation. Small sliding hiatal hernia. Moderate intermittent esophageal dysmotility. Moderate volume gastroesophageal reflux observed to the level of the mid/upper esophagus. IMPRESSION: Somewhat limited examination due to the patient's inability to stand and limited patient mobility. There is a narrowed appearance at the level of the distal esophagus and GE junction. Additionally, a 13 mm barium tablet did not pass from the distal esophagus into the stomach, despite a prolonged period of observation. Findings are suspicious for a stricture at this site, and endoscopy is recommended for further evaluation. Otherwise patulous esophagus. Moderate intermittent esophageal dysmotility. Moderate-volume gastroesophageal reflux observed to the level of the mid/upper esophagus. Small sliding hiatal hernia. Electronically Signed   By: Kellie Simmering DO   On: 07/11/2020 10:10    Cardiac Studies   2D echo 09/2019 IMPRESSIONS   1. EF 60-65%, Grade 2 DD, increased LAP, mild to moderately elevated RVSP  (49 mmHG). No change from prior.  2. Left ventricular ejection fraction, by estimation, is 60 to 65%. The  left ventricle has normal function. The left ventricle has no regional  wall motion abnormalities. Left ventricular diastolic parameters are  consistent with Grade II diastolic  dysfunction (pseudonormalization). Elevated left atrial pressure.  3. Right ventricular systolic function is normal. The right ventricular  size is normal. There is mildly elevated pulmonary artery systolic  pressure. The estimated right ventricular systolic pressure is 59.5 mmHg.  4. Left atrial size was mild to  moderately dilated.  5. The mitral valve is grossly normal. Mild mitral valve regurgitation.  No evidence of mitral stenosis.  6. The aortic valve is grossly normal. Aortic valve regurgitation is not  visualized. No aortic stenosis is present.  7. The inferior vena cava is normal in size with greater than 50%  respiratory variability, suggesting right atrial pressure of 3 mmHg.   Stress test 09/2018 Study Highlights    The left ventricular ejection fraction is hyperdynamic (>65%).  Nuclear stress EF: 80%.  There was no ST segment deviation noted during stress.  No T wave inversion was noted during stress.  The study is normal.  This is a low risk study.   Low risk stress nuclear study with  normal perfusion and normal left ventricular regional and global systolic function.   Patient Profile     75 y.o. 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 She is being seen today for the evaluation of a new diagnosis of atrial fibrillation.   Assessment & Plan    Atrial fibrillation: -The onset of her AF is very unclear as her symptoms are all not well correlated and more long-standing.  -Last documented sinus rhythm was 12/2019, though HR was 55 in clinic 2 months ago, so unlikely she was in AF at that time.  -She is not very active and her multitude of symptoms all seem much more chronic without any acute worsening recently that could be attributed to AF.  -Given her extensive history of dysphagia and esophageal pathology, her TEE risk is certainly elevated with last EGD 3 years ago.  -Given her stable hgb and no symptoms of upper GIB, TEE/DCCV would be reasonable if barium swallow were normal  without repeat EGD.  -It is difficult to say if any of her dyspnea is related without trial of restoring sinus rhythm. If she were to have recurrence without clear change in symptoms, I think rate control would be a reasonable option going forward.  -CHADS2-VASc is 6, high stroke risk and documented resolution of the esophageal ulceration.  -change SQ Lovenox to Eliquis 5mg  BID - Metoprolol 25mg  every 6 hours for continued rate control but cannot increase further due to soft BP - since she is not adequate rate controlled and cannot titrate meds further due to soft BP>>will plan for TEE/DCCV  -Barium swallow showed possible esophageal stricutre -EGD yesterday showed esophagitis with distal stricture -consolidate Lopressor 75mg  BID -start Eliquis 5mg  BID today>>ok per GI -since she is rate controlled on current medical therapy and given GI findings on endo, will not do TEE/DCCV and just wait 3 weeks on anticoagulation and then plan outpt DCCV.  Chronic chest pain: -The chest pain seems non-cardiac and is very chronic without recent change in symptoms, previously evaluated without evidence of ischemia.  -hsTroponin is likely due to atrial fibrillation with RVR in setting of CKD (135>87). -she had a nuclear stress test in 2020 for chronic CP that showed no ischemia -EGD showed esophagitis with distal stricture s/p bx  HFpEF, PH, exertional dyspnea: -The exertional dyspnea is long standing and she has had a quite extensive work-up previously, detailed in the HPI. -Probably multifactorial in the setting of HFpEF, worsening Parkinson disease, deconditioning, and possibly the AF as well. She currently is euvolemic.   HTN -BP controlled on exam today -consolidate lopressor to 75mg  BID  Disposition -Family has requested PT consult prior to going home -will consult PT    For questions or updates, please contact [facility] Please consult [email or web address] for contact info under         Signed, Fransico Him, MD  07/13/2020, 7:58 AM

## 2020-07-13 NOTE — TOC Benefit Eligibility Note (Signed)
Transition of Care [facility]) Benefit Eligibility Note    Patient Details  Name: [name] MRN: [id number] Date of Birth: Sep 29, 1945   Medication/Dose: Arne Cleveland  5 MG BID  Covered?: Yes  Tier: 3 Drug  Prescription Coverage Preferred Pharmacy: [facility]  Spoke with Person/Company/Phone Number:: [facility]   @ [facility] [place] #  [phone]  Co-Pay: $ 9.85  Prior Approval: No  Deductible:  (NO DEDUCTIBLE  and  PT'S  HAS  LOWE INCOME SUBSIDY LEVEL-1)  Additional Notes: 90 DAY SUPPLY FOR M/O $9.85    Memory Argue Phone Number: 07/13/2020, 11:30 AM

## 2020-07-13 NOTE — Care Management (Signed)
07-13-20 1258 Physician has submitted the order for outpatient Physical/Occupational Therapy. Bethena Roys, RN,BSN Case Manager

## 2020-07-13 NOTE — Progress Notes (Addendum)
ANTICOAGULATION CONSULT NOTE - Follow Up Consult  Pharmacy Consult for IV heparin Indication: atrial fibrillation  Allergies  Allergen Reactions  . Ace Inhibitors Anaphylaxis and Swelling  . Azilect [Rasagiline Mesylate] Other (See Comments)    hypotension  . Lisinopril Swelling    Severe facial angioedema requiring hospitalization 2007 (approx)    Patient Measurements: Height: 5\' 4"  (162.6 cm) Weight: 76.4 kg (168 lb 8 oz) IBW/kg (Calculated) : 54.7  Heparin dosing weight: 76.4 kg  Vital Signs: Temp: 98 F (36.7 C) (03/31 2016) Temp Source: Oral (03/31 2016) BP: 118/66 (03/31 2330) Pulse Rate: 89 (03/31 2330)  Labs: Recent Labs    07/10/20 1828 07/10/20 2122 07/11/20 0406 07/11/20 2034 07/12/20 0231 07/12/20 1243 07/13/20 0304  HGB 13.2  --  12.2  --  11.5*  --   --   HCT 38.7  --  35.5*  --  34.7*  --   --   PLT 237  --  212  --  192  --   --   HEPARINUNFRC  --   --   --  0.56 0.66  --  0.59  CREATININE 1.62*  --  1.45*  --   --  1.23*  --   TROPONINIHS 135* 87*  --   --   --   --   --     Estimated Creatinine Clearance: 39.6 mL/min (A) (by C-G formula based on SCr of 1.23 mg/dL (H)).   Assessment: 74YOF with new diagnosis of atrial fibrillation. CHADSVASC 6 indicates high risk of stroke. IV heparin initially started to facilitate endoscopy, then held prior to procedure. EGD completed 3/31 at ~1145. Per GI, can resume heparin 6 hours after procedure.  Heparin level therapeutic (0.59) on gtt at 1100 units/hr. No bleeding noted.   Goal of Therapy:  Heparin level 0.3-0.7 units/ml Monitor platelets by anticoagulation protocol: Yes   Plan:  Continue IV heparin 1100 units/hr Daily heparin level and CBC F/u transition to apixaban and plans for TEE/DCCV.  Sherlon Handing, PharmD, BCPS Please see amion for complete clinical pharmacist phone list 07/13/2020,3:41 AM

## 2020-07-16 ENCOUNTER — Telehealth: Payer: Self-pay | Admitting: Family Medicine

## 2020-07-16 ENCOUNTER — Other Ambulatory Visit: Payer: Self-pay | Admitting: Family Medicine

## 2020-07-16 ENCOUNTER — Telehealth: Payer: Self-pay | Admitting: Cardiology

## 2020-07-16 SURGERY — ECHOCARDIOGRAM, TRANSESOPHAGEAL
Anesthesia: Monitor Anesthesia Care

## 2020-07-16 MED ORDER — NYSTATIN-TRIAMCINOLONE 100000-0.1 UNIT/GM-% EX OINT: 1.0000 "application " | TOPICAL_OINTMENT | Freq: Three times a day (TID) | CUTANEOUS | 1 refills | Status: DC

## 2020-07-16 NOTE — Telephone Encounter (Signed)
[name] is calling in regards to the Echo Darden Dates that is listed on [name]'s hospital AVS that is supposed to be scheduled for today. Based on our records this was originally ordered by Dr. Harrington Challenger and scheduled at the hospital for 07/12/20. This Echo was then canceled with the reason being "Tee rescheduled for 07/16/20", but the order is no longer active and the appointment was never made. Please reinstate order and callback to schedule due to schedulers being unable to scheduled Echo Tee's.

## 2020-07-16 NOTE — Telephone Encounter (Signed)
Spoke with the patient's husband and per discharge summary the patient is to follow up with Dr. Radford Pax in 2-3 weeks to discuss possible TEE/cardioversion. I have scheduled the patient for an office visit.

## 2020-07-16 NOTE — Telephone Encounter (Signed)
Called patient to confirm she was able to get her metoprolol from the pharmacy on 4/3. Patient confirmed that she has been able to pick up her prescriptions and has been taking her medications since she returned home. She reports feeling better but not completely well.  Patient also requested to have refill of nystatin ointment for her skin condition. Refill sent to pharmacy.   Patient reports having an appt with her PCP on 07/20/20.    Eulis Foster, MD [place], PGY-2 [phone]

## 2020-07-17 LAB — SURGICAL PATHOLOGY

## 2020-07-18 ENCOUNTER — Telehealth: Payer: Self-pay

## 2020-07-18 NOTE — Telephone Encounter (Signed)
Received phone call from patient and husband requesting to speak with Dr. Higinio Plan regarding chest. Attempted to gather more information and patient became frustrated and is asking to speak to provider directly. Patient is not having current chest pain or difficulty breathing.   Please advise.   Talbot Grumbling, RN

## 2020-07-18 NOTE — Telephone Encounter (Signed)
Unfortunately unclear as to patient concern, I am scheduled to see her for hospital follow-up on 4/8.  I am unable to call her this evening, however may be able to call her at some point tomorrow afternoon.  I will also route to PCP.  Patriciaann Clan, DO

## 2020-07-19 ENCOUNTER — Encounter: Payer: Self-pay | Admitting: Gastroenterology

## 2020-07-20 ENCOUNTER — Ambulatory Visit (INDEPENDENT_AMBULATORY_CARE_PROVIDER_SITE_OTHER): Payer: Medicare PPO | Admitting: Family Medicine

## 2020-07-20 ENCOUNTER — Other Ambulatory Visit: Payer: Self-pay

## 2020-07-20 ENCOUNTER — Encounter: Payer: Self-pay | Admitting: Family Medicine

## 2020-07-20 VITALS — BP 100/60 | HR 62 | Wt 179.2 lb

## 2020-07-20 DIAGNOSIS — I4891 Unspecified atrial fibrillation: Secondary | ICD-10-CM | POA: Diagnosis not present

## 2020-07-20 DIAGNOSIS — M8949 Other hypertrophic osteoarthropathy, multiple sites: Secondary | ICD-10-CM

## 2020-07-20 DIAGNOSIS — M159 Polyosteoarthritis, unspecified: Secondary | ICD-10-CM

## 2020-07-20 DIAGNOSIS — R1310 Dysphagia, unspecified: Secondary | ICD-10-CM

## 2020-07-20 DIAGNOSIS — R0600 Dyspnea, unspecified: Secondary | ICD-10-CM | POA: Diagnosis not present

## 2020-07-20 DIAGNOSIS — R0609 Other forms of dyspnea: Secondary | ICD-10-CM

## 2020-07-20 NOTE — Assessment & Plan Note (Signed)
Requested left knee CSI today, however just had this performed in 05/2020.  Will need to wait at least an additional month.  Should readdress on follow-up visits after 5/8.

## 2020-07-20 NOTE — Assessment & Plan Note (Signed)
Hospital follow-up after diagnosis on 3/29.  Rate controlled currently with metoprolol 75 mg twice daily and on Eliquis.  Cardiology follow-up on 4/18, planning to schedule possible TEE/cardioversion.  Current A. fib likely contributing to below concern.

## 2020-07-20 NOTE — Assessment & Plan Note (Addendum)
Significantly improved s/p esophageal dilatation.  Will continue on her Protonix and sucralfate, awaiting GI follow-up outpatient.

## 2020-07-20 NOTE — Assessment & Plan Note (Addendum)
Longstanding and felt to be multifactorial in the setting of worsening Parkinson's, HFpEF, and significant deconditioning after extensive work-up with PFTs/sleep study/Lexiscan/echo etc.  Difficult to truly assess if her current shortness of breath is her chronic manifestation or new change as patient is unable to provide this information with vague description and husband was unclear as well (he felt that it was similar).  Fortunately breathing comfortably after resting and maintains oxygen saturation at rest/with activity.  Suspect her new atrial fibrillation is playing a role, however rate controlled currently.  Will obtain updated CBC, BMP, and BNP to assess for any new onset anemia (esp with Eliquis start), electrolyte/renal dysfunction, or worsening volume status.  Appears euvolemic on exam however.  Doubt PE while on adequate anticoagulation and presentation not consistent with this.  No fever, cough, or decreased breath sounds to suggest PNA. Additionally, will attempt to schedule echo to assess structural changes prior to cardiology follow-up on 4/18.

## 2020-07-20 NOTE — Patient Instructions (Signed)
It was wonderful to see you today.  We will be checking labs including looking at your blood level, kidney function, and electrolytes.  We will try to be getting you scheduled for an echocardiogram prior to see your cardiologist.  If your breathing gets worse or you have any associated chest pain or palpitations ("heart beating out of your chest") please go to the ED.

## 2020-07-20 NOTE — Progress Notes (Signed)
SUBJECTIVE:   CHIEF COMPLAINT / HPI: Hospital follow-up/SOB  [name] is a 75 year old female presenting with her husband to discuss the following:  Hospital follow-up: She was recently admitted from 3/29 to 07/13/2020 due to SOB/chest pain in the setting of new onset atrial fibrillation with RVR, in addition to worsening dysphagia.  Cardiology and GI were consulted.  Underwent EGD with esophageal dilatation.  Additionally started on Eliquis and metoprolol, titrated up to 75 mg twice daily.  Initially planned for TEE with cardioversion, however due to esophageal concerns, opted to wait until outpatient.  Today she reports that she is doing much better in terms of her previous dysphagia and heart rate.  She has had no difficulty swallowing since discharge.  No nausea or vomiting.  Able to take her metoprolol and Eliquis without concern.  Shortness of breath: She states that she felt "really good" for about the first 2 days after hospital discharge, however since yesterday feels like her breathing has been a little bit heavy especially with walking around her house. Mainly with exertion, after about 5-10 minutes she feels back to herself. She has a longstanding history of dyspnea on exertion and is unable to say if this is like her previous experience. When discussing with her husband, he feels like her current shortness of breath is similar to previous.  She now has been eating more since her dysphagia has improved, but otherwise no significant changes that she is aware of.  Denies any associated palpitations, leg swelling, or chest pain.  No hematochezia or melena that she is aware of.  No cough, nasal congestion, fever, or new fatigue.  At baseline is able to walk from room to room in her house but will feel short of breath, she is still able to do this at a same level.   PERTINENT  PMH / PSH: Pulmonary hypertension, newly diagnosed A. fib, hypertension, esophageal dysmotility and stricture s/p  dilatation, Parkinson's disease, type 2 diabetes, osteoarthritis, chronic dyspnea on exertion, cognitive impairment (MMSE 18/30 in 2015)   OBJECTIVE:   BP 100/60   Pulse 62   Wt 179 lb 3.2 oz (81.3 kg)   SpO2 96%   BMI 30.76 kg/m   General: Alert, NAD HEENT: NCAT, MMM, airway patent, tongue smacking present Cardiac: Irregularly irregular rate and rhythm no murmur appreciated. Lungs: Clear throughout all lung fields without any wheezing or crackles.  Initially with mild tachypnea with some accessory muscle use after returning from the restroom, after sitting for several minutes had normal work of breathing.  Able to speak in full sentences, does occasionally stop for a deep breath. Abdomen: soft, non-tender, non-distended, normoactive BS Msk: Moves all extremities spontaneously  Ext: Warm, dry, 2+ distal pulses, no edema bilaterally, resting tremor of hands and extremities  Oxygen saturation at rest on room air: 96% Oxygen saturation with activity on room air: Range 96-98%   ASSESSMENT/PLAN:   New onset atrial fibrillation [facility] Hospital follow-up after diagnosis on 3/29.  Rate controlled currently with metoprolol 75 mg twice daily and on Eliquis.  Cardiology follow-up on 4/18, planning to schedule possible TEE/cardioversion.  Current A. fib likely contributing to below concern.  Dysphagia Significantly improved s/p esophageal dilatation.  Will continue on her Protonix and sucralfate, awaiting GI follow-up outpatient.  Dyspnea on exertion Longstanding and felt to be multifactorial in the setting of worsening Parkinson's, HFpEF, and significant deconditioning after extensive work-up with PFTs/sleep study/Lexiscan/echo etc.  Difficult to truly assess if her current shortness of breath  is her chronic manifestation or new change as patient is unable to provide this information with vague description and husband was unclear as well (he felt that it was similar).  Fortunately breathing  comfortably after resting and maintains oxygen saturation at rest/with activity.  Suspect her new atrial fibrillation is playing a role, however rate controlled currently.  Will obtain updated CBC, BMP, and BNP to assess for any new onset anemia (esp with Eliquis start), electrolyte/renal dysfunction, or worsening volume status.  Appears euvolemic on exam however.  Doubt PE while on adequate anticoagulation and presentation not consistent with this.  No fever, cough, or decreased breath sounds to suggest PNA. Additionally, will attempt to schedule echo to assess structural changes prior to cardiology follow-up on 4/18.  Osteoarthritis of multiple joints Requested left knee CSI today, however just had this performed in 05/2020.  Will need to wait at least an additional month.  Should readdress on follow-up visits after 5/8.    Update: Scheduled echocardiogram for 4/26, however will attempt to get this sooner but will need to be approved first.  We will work towards this on Monday morning.  Additionally, notified by lab staff after patient was already gone from the office that they could not obtain labs due to her movement.  They recommended she go to the [facility] with more technicians available to have her labs drawn, however patient refused.  Lab Tech set up lab visit for Monday at our office to retry.  Attempted to reach patient to discuss getting labs today, however no answer and left voicemail.  Scheduled for follow-up on 4/13.  Adamantly discussed ED precautions including worsening shortness of breath, any chest pain, or palpitations during our visit.   Patriciaann Clan, [place]

## 2020-07-23 ENCOUNTER — Telehealth: Payer: Self-pay | Admitting: Family Medicine

## 2020-07-23 ENCOUNTER — Other Ambulatory Visit: Payer: Medicare PPO

## 2020-07-23 NOTE — Telephone Encounter (Signed)
Call patient to check in and see how she was doing.  No answer left voicemail.  Patriciaann Clan, DO

## 2020-07-24 ENCOUNTER — Telehealth: Payer: Self-pay | Admitting: Family Medicine

## 2020-07-24 NOTE — Telephone Encounter (Signed)
Call patient to check in as a missed her lab appointment yesterday.  She states that she forgot.  Reports that she is feeling significantly better compared to last week, states her breathing is not an issue today.  However, states that she does feel little bit shaky.  Initially she is unable to tell me how long this has been, however later endorses that she has had this off and on for approximately 15 years.  She also states that she did have a little bit of chest pain on her lower left side last night that resolved spontaneously this morning.  Reports this is no different from her longstanding history of intermittent chest pain that has been extensively worked up in the past.  Pressure-like sensation.  Denies any palpitations/SOB with it.  Overall despite previous reassuring work-up, she has had several changes recently with new onset atrial fibrillation, would benefit from further evaluation today in the ED, however declines at this time due to this being similar to previous.  Informed her of her appointment tomorrow at 11:10 AM to check in if not evaluated sooner.  Should get labs obtained at that time.  Patriciaann Clan, DO

## 2020-07-25 ENCOUNTER — Ambulatory Visit: Payer: Medicare PPO

## 2020-07-26 ENCOUNTER — Emergency Department (HOSPITAL_COMMUNITY): Payer: Medicare PPO

## 2020-07-26 ENCOUNTER — Inpatient Hospital Stay (HOSPITAL_COMMUNITY)
Admission: EM | Admit: 2020-07-26 | Discharge: 2020-07-30 | DRG: 690 | Disposition: A | Discharge status: Home Health | Payer: Medicare PPO | Attending: Family Medicine | Admitting: Family Medicine

## 2020-07-26 DIAGNOSIS — Z79891 Long term (current) use of opiate analgesic: Secondary | ICD-10-CM

## 2020-07-26 DIAGNOSIS — K2100 Gastro-esophageal reflux disease with esophagitis, without bleeding: Secondary | ICD-10-CM | POA: Diagnosis present

## 2020-07-26 DIAGNOSIS — Z888 Allergy status to other drugs, medicaments and biological substances status: Secondary | ICD-10-CM

## 2020-07-26 DIAGNOSIS — R410 Disorientation, unspecified: Secondary | ICD-10-CM | POA: Diagnosis present

## 2020-07-26 DIAGNOSIS — I509 Heart failure, unspecified: Secondary | ICD-10-CM | POA: Diagnosis not present

## 2020-07-26 DIAGNOSIS — M1712 Unilateral primary osteoarthritis, left knee: Secondary | ICD-10-CM | POA: Diagnosis present

## 2020-07-26 DIAGNOSIS — B961 Klebsiella pneumoniae [K. pneumoniae] as the cause of diseases classified elsewhere: Secondary | ICD-10-CM | POA: Diagnosis present

## 2020-07-26 DIAGNOSIS — I4891 Unspecified atrial fibrillation: Secondary | ICD-10-CM | POA: Diagnosis present

## 2020-07-26 DIAGNOSIS — G9349 Other encephalopathy: Secondary | ICD-10-CM | POA: Diagnosis present

## 2020-07-26 DIAGNOSIS — I130 Hypertensive heart and chronic kidney disease with heart failure and stage 1 through stage 4 chronic kidney disease, or unspecified chronic kidney disease: Secondary | ICD-10-CM | POA: Diagnosis present

## 2020-07-26 DIAGNOSIS — F419 Anxiety disorder, unspecified: Secondary | ICD-10-CM | POA: Diagnosis present

## 2020-07-26 DIAGNOSIS — N390 Urinary tract infection, site not specified: Principal | ICD-10-CM

## 2020-07-26 DIAGNOSIS — N179 Acute kidney failure, unspecified: Secondary | ICD-10-CM | POA: Diagnosis present

## 2020-07-26 DIAGNOSIS — Z66 Do not resuscitate: Secondary | ICD-10-CM | POA: Diagnosis present

## 2020-07-26 DIAGNOSIS — I5032 Chronic diastolic (congestive) heart failure: Secondary | ICD-10-CM | POA: Diagnosis present

## 2020-07-26 DIAGNOSIS — E876 Hypokalemia: Secondary | ICD-10-CM | POA: Diagnosis present

## 2020-07-26 DIAGNOSIS — E785 Hyperlipidemia, unspecified: Secondary | ICD-10-CM | POA: Diagnosis present

## 2020-07-26 DIAGNOSIS — Z90710 Acquired absence of both cervix and uterus: Secondary | ICD-10-CM

## 2020-07-26 DIAGNOSIS — Z7901 Long term (current) use of anticoagulants: Secondary | ICD-10-CM

## 2020-07-26 DIAGNOSIS — G20 Parkinson's disease: Secondary | ICD-10-CM

## 2020-07-26 DIAGNOSIS — I272 Pulmonary hypertension, unspecified: Secondary | ICD-10-CM | POA: Diagnosis present

## 2020-07-26 DIAGNOSIS — R4701 Aphasia: Secondary | ICD-10-CM | POA: Diagnosis present

## 2020-07-26 DIAGNOSIS — E1122 Type 2 diabetes mellitus with diabetic chronic kidney disease: Secondary | ICD-10-CM | POA: Diagnosis present

## 2020-07-26 DIAGNOSIS — F05 Delirium due to known physiological condition: Secondary | ICD-10-CM | POA: Diagnosis present

## 2020-07-26 DIAGNOSIS — I4892 Unspecified atrial flutter: Secondary | ICD-10-CM | POA: Diagnosis present

## 2020-07-26 DIAGNOSIS — K449 Diaphragmatic hernia without obstruction or gangrene: Secondary | ICD-10-CM | POA: Diagnosis present

## 2020-07-26 DIAGNOSIS — F0280 Dementia in other diseases classified elsewhere without behavioral disturbance: Secondary | ICD-10-CM | POA: Diagnosis present

## 2020-07-26 DIAGNOSIS — N183 Chronic kidney disease, stage 3 unspecified: Secondary | ICD-10-CM | POA: Diagnosis present

## 2020-07-26 DIAGNOSIS — R4702 Dysphasia: Secondary | ICD-10-CM | POA: Diagnosis not present

## 2020-07-26 DIAGNOSIS — R4781 Slurred speech: Secondary | ICD-10-CM | POA: Diagnosis not present

## 2020-07-26 DIAGNOSIS — Z79899 Other long term (current) drug therapy: Secondary | ICD-10-CM

## 2020-07-26 DIAGNOSIS — R001 Bradycardia, unspecified: Secondary | ICD-10-CM | POA: Diagnosis present

## 2020-07-26 DIAGNOSIS — Z20822 Contact with and (suspected) exposure to covid-19: Secondary | ICD-10-CM | POA: Diagnosis present

## 2020-07-26 DIAGNOSIS — Z9049 Acquired absence of other specified parts of digestive tract: Secondary | ICD-10-CM

## 2020-07-26 DIAGNOSIS — R4182 Altered mental status, unspecified: Secondary | ICD-10-CM | POA: Diagnosis not present

## 2020-07-26 DIAGNOSIS — Z515 Encounter for palliative care: Secondary | ICD-10-CM | POA: Diagnosis not present

## 2020-07-26 DIAGNOSIS — M419 Scoliosis, unspecified: Secondary | ICD-10-CM | POA: Diagnosis present

## 2020-07-26 DIAGNOSIS — R296 Repeated falls: Secondary | ICD-10-CM | POA: Diagnosis present

## 2020-07-26 DIAGNOSIS — Z8249 Family history of ischemic heart disease and other diseases of the circulatory system: Secondary | ICD-10-CM

## 2020-07-26 DIAGNOSIS — Z7189 Other specified counseling: Secondary | ICD-10-CM | POA: Diagnosis not present

## 2020-07-26 DIAGNOSIS — R531 Weakness: Secondary | ICD-10-CM | POA: Diagnosis not present

## 2020-07-26 DIAGNOSIS — I10 Essential (primary) hypertension: Secondary | ICD-10-CM | POA: Diagnosis not present

## 2020-07-26 DIAGNOSIS — Z96651 Presence of right artificial knee joint: Secondary | ICD-10-CM | POA: Diagnosis present

## 2020-07-26 DIAGNOSIS — G4489 Other headache syndrome: Secondary | ICD-10-CM | POA: Diagnosis not present

## 2020-07-26 DIAGNOSIS — G894 Chronic pain syndrome: Secondary | ICD-10-CM | POA: Diagnosis present

## 2020-07-26 DIAGNOSIS — J90 Pleural effusion, not elsewhere classified: Secondary | ICD-10-CM | POA: Diagnosis not present

## 2020-07-26 LAB — URINALYSIS, ROUTINE W REFLEX MICROSCOPIC
Bilirubin Urine: NEGATIVE
Glucose, UA: NEGATIVE mg/dL
Ketones, ur: 20 mg/dL — AB
Leukocytes,Ua: NEGATIVE
Nitrite: POSITIVE — AB
Protein, ur: 30 mg/dL — AB
Specific Gravity, Urine: 1.015 (ref 1.005–1.030)
pH: 6 (ref 5.0–8.0)

## 2020-07-26 LAB — COMPREHENSIVE METABOLIC PANEL
ALT: 21 U/L (ref 0–44)
AST: 23 U/L (ref 15–41)
Albumin: 3.7 g/dL (ref 3.5–5.0)
Alkaline Phosphatase: 60 U/L (ref 38–126)
Anion gap: 11 (ref 5–15)
BUN: 9 mg/dL (ref 8–23)
CO2: 23 mmol/L (ref 22–32)
Calcium: 9.2 mg/dL (ref 8.9–10.3)
Chloride: 104 mmol/L (ref 98–111)
Creatinine, Ser: 1.03 mg/dL — ABNORMAL HIGH (ref 0.44–1.00)
GFR, Estimated: 57 mL/min — ABNORMAL LOW (ref >60)
Glucose, Bld: 108 mg/dL — ABNORMAL HIGH (ref 70–99)
Potassium: 3.3 mmol/L — ABNORMAL LOW (ref 3.5–5.1)
Sodium: 138 mmol/L (ref 135–145)
Total Bilirubin: 1.7 mg/dL — ABNORMAL HIGH (ref 0.3–1.2)
Total Protein: 6.9 g/dL (ref 6.5–8.1)

## 2020-07-26 LAB — RESP PANEL BY RT-PCR (FLU A&B, COVID) ARPGX2
Influenza A by PCR: NEGATIVE
Influenza B by PCR: NEGATIVE
SARS Coronavirus 2 by RT PCR: NEGATIVE

## 2020-07-26 LAB — CBC WITH DIFFERENTIAL/PLATELET
Abs Immature Granulocytes: 0.04 10*3/uL (ref 0.00–0.07)
Basophils Absolute: 0 10*3/uL (ref 0.0–0.1)
Basophils Relative: 1 %
Eosinophils Absolute: 0.1 10*3/uL (ref 0.0–0.5)
Eosinophils Relative: 1 %
HCT: 38.8 % (ref 36.0–46.0)
Hemoglobin: 13.1 g/dL (ref 12.0–15.0)
Immature Granulocytes: 1 %
Lymphocytes Relative: 15 %
Lymphs Abs: 1.1 10*3/uL (ref 0.7–4.0)
MCH: 32 pg (ref 26.0–34.0)
MCHC: 33.8 g/dL (ref 30.0–36.0)
MCV: 94.6 fL (ref 80.0–100.0)
Monocytes Absolute: 0.7 10*3/uL (ref 0.1–1.0)
Monocytes Relative: 9 %
Neutro Abs: 5.6 10*3/uL (ref 1.7–7.7)
Neutrophils Relative %: 73 %
Platelets: 255 10*3/uL (ref 150–400)
RBC: 4.1 MIL/uL (ref 3.87–5.11)
RDW: 14.1 % (ref 11.5–15.5)
WBC: 7.6 10*3/uL (ref 4.0–10.5)
nRBC: 0 % (ref 0.0–0.2)

## 2020-07-26 LAB — TROPONIN I (HIGH SENSITIVITY)
Troponin I (High Sensitivity): 16 ng/L (ref <18)
Troponin I (High Sensitivity): 18 ng/L — ABNORMAL HIGH (ref <18)

## 2020-07-26 LAB — LIPASE, BLOOD: Lipase: 41 U/L (ref 11–51)

## 2020-07-26 MED ORDER — POLYETHYLENE GLYCOL 3350 17 G PO PACK
17.0000 g | PACK | Freq: Every day | ORAL | Status: DC
  Administered 2020-07-28 – 2020-07-30 (×2): 17 g via ORAL
  Filled 2020-07-26 (×2): qty 1

## 2020-07-26 MED ORDER — CARBIDOPA-LEVODOPA 25-100 MG PO TABS
1.0000 | ORAL_TABLET | Freq: Every day | ORAL | Status: DC
  Administered 2020-07-27 – 2020-07-28 (×3): 1 via ORAL
  Filled 2020-07-26 (×5): qty 1

## 2020-07-26 MED ORDER — CARBIDOPA-LEVODOPA 25-100 MG PO TABS
1.0000 | ORAL_TABLET | Freq: Every day | ORAL | Status: DC
  Filled 2020-07-26 (×2): qty 1

## 2020-07-26 MED ORDER — SERTRALINE HCL 100 MG PO TABS: 100.0000 mg | ORAL_TABLET | Freq: Every day | ORAL | Status: DC

## 2020-07-26 MED ORDER — SODIUM CHLORIDE 0.9 % IV SOLN
1.0000 g | Freq: Every day | INTRAVENOUS | Status: DC
  Administered 2020-07-27 – 2020-07-28 (×3): 1 g via INTRAVENOUS
  Filled 2020-07-26 (×3): qty 10
  Filled 2020-07-26: qty 1

## 2020-07-26 MED ORDER — POTASSIUM CHLORIDE CRYS ER 20 MEQ PO TBCR
20.0000 meq | EXTENDED_RELEASE_TABLET | Freq: Once | ORAL | Status: DC
  Filled 2020-07-26: qty 1

## 2020-07-26 MED ORDER — SODIUM CHLORIDE 0.9 % IV SOLN: INTRAVENOUS | Status: AC

## 2020-07-26 MED ORDER — SUCRALFATE 1 GM/10ML PO SUSP
1.0000 g | Freq: Three times a day (TID) | ORAL | Status: DC
  Filled 2020-07-26 (×2): qty 10

## 2020-07-26 MED ORDER — APIXABAN 5 MG PO TABS
5.0000 mg | ORAL_TABLET | Freq: Two times a day (BID) | ORAL | Status: DC
  Administered 2020-07-27 – 2020-07-30 (×7): 5 mg via ORAL
  Filled 2020-07-26 (×7): qty 1

## 2020-07-26 MED ORDER — PANTOPRAZOLE SODIUM 40 MG PO TBEC
40.0000 mg | DELAYED_RELEASE_TABLET | Freq: Every day | ORAL | Status: DC
  Administered 2020-07-28 – 2020-07-30 (×3): 40 mg via ORAL
  Filled 2020-07-26 (×3): qty 1

## 2020-07-26 NOTE — ED Triage Notes (Signed)
Patient brought in by [facility] for aphasia that the patient has experienced since Tuesday at 1700.  Patient also states that she has been weak and has a headache.  EMS also states that the patient has a hard time getting her words out.  Patient is conscious and alert at triage.    BP: 160/80 HR: 80 CBG:116

## 2020-07-26 NOTE — H&P (Addendum)
[facility] Admission History and Physical Service Pager: [phone]  [name] Medical record number: [id number] Date of birth: 06-21-1945 Age: 75 y.o. Gender: female  Primary Care Provider: [name], MD Consultants: None Code Status: Full Preferred Emergency Contact: Husband Kerianne Gurr [phone], [phone]  Chief Complaint: Worsening confusion  Assessment and Plan: [name] is a 75 y.o. female presenting with worsening confusion. Recently discharged 4/1, evaluated for new-onset A.fib with RVR and dysphagia s/p esophageal dilation. PMH is significant for hypertension, pulmonary hypertension, DM2, hyperlipidemia, CKD stage III, Parkinson's disease.   AMS likely 2/2 UTI Recently discharged 4/1, evaluated for new-onset A.fib with RVR and dysphagia s/p esophageal dilation. Patient presents to ED with two days of waxing/waning confusion, decreased PO intake, and urinary urgency, incontinence, and frequency. Per family, acutely started Tuesday afternoon. Patient did not want to eat or drink much starting Wednesday and has refused her home medications since that time. Prior to then was compliant with meds. On admission, vital signs stable with CBC wnl. CMP remarkable only for potassium of 3.3 and Cr 1.03. Troponins 16, 18. EKG shows atrial flutter with bradycardia at 59 bpm. UA demonstrates moderate Hgb, 20 ketones, positives nitrites, 30 protein, and many bacteria. Per husband, the pt has had increased frequency and incontinence. Respiratory labs negative for COVID, Flu A, and Flu B. CXR show mild congestive failure. CT head w/o contrast negative for acute abnormality. Mental status waxing and waning even within the ED; for first physician, patient was A&Ox0, while for the second physician she could recall her name and the location. Differential includes delirium secondary to UTI, medication-induced delirium, and worsening  Parkinson's disease progression. Unlikely to be 2/2 acute CVA given her lack of focal neuro deficits on exam. MRI would be difficult to obtain at this time given her parkinsons and confusion.   - admit to med tele with dr. Gwendlyn Deutscher attending - consider consult to neuro given her advanced parkinsons - follow up urine culture - mIVF with NS at 100 mL/hr - ceftriaxone 1g IV daily for empiric UTI treatment - f/u urine culture for antibiotic narrowing - f/u vbg - vitals per unit routine - delirium precautions - PT/OT eval and treat - holding sinemet until we can reassess pt status in AM   Recent new-onset A. Fib with RVR  Pulmonary HTN Discharged 3/31 after admission for new-onset A. Fib with RVR on Eliquis 5 mg BID, metoprolol 75 mg BID. Admission EKG appears to be A. Flutter with bradycardia to 59 bpm.  - admit to med tele as above - awaiting repeat EKG - continue Eliquis - hold home metoprolol due to bradyacardia - morning EKGs - continuous cardiac monitoring - vitals per unit routine  Hypokalemia Potassium on admission 3.3.  - Kdur 20 mEq once - follow up morning BMP  Hx Dysphgia  S/p esophageal dilation 3/31 with GI while admitted. Last discharged 3/31 on zofran 4 mg q8 PRN,  Sucralfate 1g qAC & qHS. Not currently complaining of dysphagia or throat pain.  - continue sucralfate - protonix 40 mg daily - if nauseous during admission, will determine best med at that time - NPO  Depression  anxiety  Parkinson's disease Home meds include zoloft 100 mg daily, carbidopa-levodopa 25-100 mg 5 times daily - hold zoloft and carbidopa-levodopa tonight. Reassess mental status in AM.  Restart meds when able to avoid withdrawal symptoms.   Chronic Knee Pain Home meds include gabapentin 300 mg tablets PRN for  pain (2 capsules AM, 2 capsules lunch, 3 capsules PM) and tramadol 50 mg BID PRN.  - decline to restart at admission out of concern for AMS   FEN/GI: NPO pending bedside  swallow Prophylaxis: Eliquis (new-onset A.fib at last admission)  Disposition: Med tele  History of Present Illness:  [name] is a 75 y.o. female presenting with worsening confusion. Husband at bedside, reports this confusion started Tuesday afternoon and has progressed since then. Starting Wednesday, patient did not want to take medicines or eat or drink much. Husband notes patient has fast breathing yesterday but that has resolved. Husband doesn't noticed any complaints from patient. [name] herself has no complaints, can't tell me why she's here.   Husband also believes patient has experienced urinary urgency, incontinence, and frequency recently. Also endorses diarrhea.   Review Of Systems: Per HPI with the following additions:   Review of Systems  Constitutional: Positive for activity change and appetite change.  Respiratory: Negative for cough and shortness of breath.   Cardiovascular: Negative for chest pain.  Gastrointestinal: Positive for diarrhea and nausea. Negative for constipation and vomiting.  Genitourinary: Positive for frequency and urgency.  Neurological: Positive for tremors and speech difficulty. Negative for syncope.  Psychiatric/Behavioral: Positive for confusion.     Patient Active Problem List   Diagnosis Date Noted  . Chest pain 07/10/2020  . New onset atrial fibrillation ([place]) 07/10/2020  . Pulmonary HTN ([place])   . Hiatal hernia with GERD and esophagitis 08/20/2017  . Osteoarthritis of left knee 07/28/2017  . Dyspnea on exertion 11/14/2016  . Frequent falls 05/14/2016  . CKD stage 3 due to type 2 diabetes mellitus ([place]) 02/15/2015  . Chronic pain syndrome 06/15/2014  . Cognitive impairment 12/11/2013  . B12 deficiency 10/17/2013  . Dysphagia 06/02/2013  . Parkinson's disease ([place]) 01/18/2013  . Substance abuse, episodic 09/20/2010  . Esophageal dysmotility 08/12/2010  . SCOLIOSIS 03/28/2009  . Diabetes mellitus type 2, diet-controlled  ([place]) 08/05/2006  . Hyperlipidemia 08/05/2006  . Essential hypertension 08/05/2006  . Osteoarthritis of multiple joints 08/04/2006  . Major depressive disorder, recurrent episode ([place]) 06/11/2006    Past Medical History: Past Medical History:  Diagnosis Date  . Abdominal pain, chronic, right upper quadrant 09/22/2019  . Acute esophagitis   . Allergy   . Anxiety   . Arthritis    "knees, back" (06/29/2014)  . ARTHRITIS, BACK 03/28/2009   Qualifier: Diagnosis of  By: Nori Riis MD, [name]    . CARPAL TUNNEL SYNDROME, LEFT 01/05/2008   Qualifier: Diagnosis of  By: Nori Riis MD, [name]    . Cataract   . Chronic bronchitis ([place])    "get it q yr"  . Chronic mid back pain   . Cognitive impairment 12/11/2013  . Complication of anesthesia 07/2008   "hard to get me woke up when I had my knee replaced; they said they had to bring me back"  . Dementia ([place])    "I have some; not dx'd by a dr" (08/06/2017)  . Diabetes mellitus type 2, diet-controlled ([place]) 08/05/2006       . Disorder of bursae and tendons in shoulder region 01/24/2009   Qualifier: Diagnosis of  By: Nori Riis MD, [name]    . Esophageal abnormality 08/12/2010   Barium swallow 12/2011  IMPRESSION: Esophageal dysmotility.  No fixed esophageal narrowing or stricture. However, the barium tablet was transiently delayed at the GE junction.  Postsurgical changes at the GE junction. Gastroesophageal reflux is suspected, but could not be confirmed  due to the patient's inability to clear her esophagus in the prone position.    . Esophageal stricture   . GERD (gastroesophageal reflux disease)   . Greater trochanteric bursitis of right hip 07/14/2019  . Hematemesis 08/04/2017  . Hiatal hernia   . History of gout 1970's  . Hyperlipidemia   . Hypertension   . Major depressive disorder, recurrent episode ([place]) 06/11/2006   Qualifier: Diagnosis of  By: Beryle Lathe    . Movement disorder   . Neuromuscular disorder ([place])   . Osteoarthritis of multiple joints  08/04/2006   S/p R TKR Significant OA continues to be a problem in her left knee. Lower back OA Complicated by her parkinsonism    . Parkinson's disease ([place])   . Pulmonary HTN ([place])    moderate with PASP 64mmHg on echo 09/2019  . 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 SCOLIOSIS 03/28/2009   Qualifier: Diagnosis of  By: Nori Riis MD, [name]    . Situational depression   . Substance abuse ([place])   . Tinnitus 02/15/2015   Unclear what this is from. Likely multifactorial.   . Type II diabetes mellitus ([place])   . Ulcerative esophagitis   . Urticaria, idiopathic 12/21/2013   One month of urticarial rash that is consistent with hives. She also has a lot of dry skin.   Marland Kitchen UTI (lower urinary tract infection)     Past Surgical History: Past Surgical History:  Procedure Laterality Date  . ABDOMINAL HYSTERECTOMY    . BALLOON DILATION N/A 07/12/2020   Procedure: BALLOON DILATION;  Surgeon: Jackquline Denmark, MD;  Location: [facility] ENDOSCOPY;  Service: Endoscopy;  Laterality: N/A;  . BIOPSY  07/12/2020   Procedure: BIOPSY;  Surgeon: Jackquline Denmark, MD;  Location: [facility];  Service: Endoscopy;;  . CHOLECYSTECTOMY OPEN    . COLON SURGERY    . COLONOSCOPY  07/08/2019  . DILATION AND CURETTAGE OF UTERUS    . ESOPHAGOGASTRIC FUNDOPLASTY     some type "esoph surgery" per pt  . ESOPHAGOGASTRODUODENOSCOPY (EGD) WITH PROPOFOL N/A 08/05/2017   Procedure: ESOPHAGOGASTRODUODENOSCOPY (EGD) WITH PROPOFOL;  Surgeon: Jerene Bears, MD;  Location: [facility];  Service: Gastroenterology;  Laterality: N/A;  . ESOPHAGOGASTRODUODENOSCOPY (EGD) WITH PROPOFOL N/A 07/12/2020   Procedure: ESOPHAGOGASTRODUODENOSCOPY (EGD) WITH PROPOFOL;  Surgeon: Jackquline Denmark, MD;  Location[facility] ENDOSCOPY;  Service: Endoscopy;  Laterality: N/A;  with dil  . JOINT REPLACEMENT    . TOTAL KNEE ARTHROPLASTY Right 07/2008  . TUBAL LIGATION      Social History: Social History   Tobacco Use  . Smoking status: Never Smoker  . Smokeless  tobacco: Never Used  Vaping Use  . Vaping Use: Never used  Substance Use Topics  . Alcohol use: Yes    Comment: Occasional  . Drug use: No   Additional social history: None Please also refer to relevant sections of EMR.  Family History: Family History  Problem Relation Age of Onset  . Heart disease Mother   . Diabetes Mother   . Heart disease Father   . Heart attack Sister   . Heart disease Sister   . Heart attack Brother   . Heart disease Brother   . Cerebral palsy Son   . Colon cancer Neg Hx   . Esophageal cancer Neg Hx   . Liver cancer Neg Hx   . Pancreatic cancer Neg Hx   . Stomach cancer Neg Hx   . Rectal cancer Neg Hx     Allergies  and Medications: Allergies  Allergen Reactions  . Ace Inhibitors Anaphylaxis and Swelling  . Azilect [Rasagiline Mesylate] Other (See Comments)    hypotension  . Lisinopril Swelling    Severe facial angioedema requiring hospitalization 2007 (approx)   No current facility-administered medications on file prior to encounter.   Current Outpatient Medications on File Prior to Encounter  Medication Sig Dispense Refill  . acetaminophen (TYLENOL) 325 MG tablet Take 2 tablets (650 mg total) by mouth every 4 (four) hours as needed for headache or mild pain.    Marland Kitchen apixaban (ELIQUIS) 5 MG TABS tablet Take 1 tablet (5 mg total) by mouth 2 (two) times daily. 120 tablet 0  . carbidopa-levodopa (SINEMET IR) 25-100 MG tablet Take 1 tablet by mouth 5 (five) times daily. 450 tablet 4  . gabapentin (NEURONTIN) 300 MG capsule Take 300-600 mg by mouth See admin instructions. Take  2 capsules in the morning and 2 capsules at lunch 3 capsule in the evening as needed for pain    . loperamide (IMODIUM A-D) 2 MG tablet Take 2 mg by mouth 4 (four) times daily as needed for diarrhea or loose stools.    . Metoprolol Tartrate 75 MG TABS TAKE 1 TABLET BY MOUTH TWICE DAILY 180 tablet 0  . nystatin-triamcinolone ointment (MYCOLOG) Apply 1 application topically 3  (three) times daily. 30 g 1  . omeprazole (PRILOSEC) 40 MG capsule TAKE 1 CAPSULE BY MOUTH TWICE DAILY (Patient taking differently: Take 40 mg by mouth in the morning and at bedtime.) 90 capsule 3  . ondansetron (ZOFRAN) 4 MG tablet Take 1 tablet (4 mg total) by mouth every 8 (eight) hours as needed for nausea or vomiting. 21 tablet 0  . sertraline (ZOLOFT) 100 MG tablet Take 1 tablet (100 mg total) by mouth daily. Per psych 60 tablet 0  . sucralfate (CARAFATE) 1 GM/10ML suspension Take 10 mLs (1 g total) by mouth 4 (four) times daily -  with meals and at bedtime. 840 mL 3  . traMADol (ULTRAM) 50 MG tablet TAKE 1 TO 2 TABLETS BY MOUTH TWICE DAILY AS NEEDED FOR CHRONIC KNEE PAIN (Patient taking differently: Take 50-100 mg by mouth 2 (two) times daily as needed (chronic knee pain).) 120 tablet 3    Objective: BP (!) 168/114 (BP Location: Right Arm)   Pulse (!) 59   Temp 98.5 F (36.9 C) (Oral)   Resp 18   SpO2 99%  Exam: General: sleepy, quiet, slowed mentation and movement, A&Ox0 (could not tell me name, place, year, season), no distress  ENTM: Moist oral mucosa Cardiovascular: regular rhythm, bradycardic in 50s, no murmur Respiratory: CTAB Gastrointestinal: soft, non-distended, non-tender MSK: moving all extremities spontaneously Neuro: cranial nerves II-X grossly intact, moving all extremities  Psych:  A&Ox0 (could not tell me name, place, year, season), slowed mentation and movement  Labs and Imaging: CBC BMET  Recent Labs  Lab 07/26/20 1733  WBC 7.6  HGB 13.1  HCT 38.8  PLT 255   Recent Labs  Lab 07/26/20 1733  NA 138  K 3.3*  CL 104  CO2 23  BUN 9  CREATININE 1.03*  GLUCOSE 108*  CALCIUM 9.2     EKG: Atrial flutter, rate 59 bpm, no ST segment elevation  CHEST - 2 VIEW 07/26/2020 COMPARISON:  09/29/2018 FINDINGS: Frontal and lateral views of the chest demonstrate enlarged cardiac silhouette. There is central vascular congestion, with trace right pleural  effusion. No airspace disease or pneumothorax. Degenerative changes of the bilateral shoulders.  IMPRESSION: 1. Mild congestive heart failure.  CT HEAD WITHOUT CONTRAST 07/26/2020 TECHNIQUE: Contiguous axial images were obtained from the base of the skull through the vertex without intravenous contrast. COMPARISON:  CT head 07/10/2020 FINDINGS: Limited evaluation due to motion artifact. Brain: Cerebral ventricle sizes are concordant with the degree of cerebral volume loss. Patchy and confluent areas of decreased attenuation are noted throughout the deep and periventricular white matter of the cerebral hemispheres bilaterally, compatible with chronic microvascular ischemic disease. No evidence of large-territorial acute infarction. No parenchymal  hemorrhage. No mass lesion. No extra-axial collection. No mass effect or midline shift. No hydrocephalus. Basilar cisterns are patent. Vascular: No hyperdense vessel. Skull: No acute displaced fracture or focal lesion. Sinuses/Orbits: Paranasal sinuses and mastoid air cells are clear. The orbits are unremarkable. Other: None. IMPRESSION: No acute intracranial abnormality. Please note limited evaluation due to motion artifact.   Ezequiel Essex, MD 07/26/2020, 10:02 PM PGY-1, [place] Intern pager: [phone], text pages welcome

## 2020-07-26 NOTE — ED Provider Notes (Signed)
[facility] EMERGENCY DEPARTMENT Provider Note   CSN: [id number] Arrival date & time: 07/26/20  1718     History No chief complaint on file.   [name] is a 75 y.o. female.  HPI Patient was recently admitted to family medicine for new onset A. fib.  She has a complex medical history including Parkinson's, hypertension, heart failure with preserved ejection fraction, pulmonary hypertension, diabetes, hyperlipidemia, CKD she was discharged on metoprolol for rate control and Eliquis.  In the hospital, she underwent EGD for esophageal stricture dilation. Per EMS, they were called out for aphasia and altered mental status throughout the day today.  Per EMS last known normal was 1700 on Tuesday.  Attempted to call family for further collateral but no answer on either line.  Per documentation from 2 days prior to arrival, patient is able to relate full stories with difficulty, today she is unable to further elucidate her symptoms.  Patient is unsure why she was brought here.   Past Medical History:  Diagnosis Date  . Abdominal pain, chronic, right upper quadrant 09/22/2019  . Acute esophagitis   . Allergy   . Anxiety   . Arthritis    "knees, back" (06/29/2014)  . ARTHRITIS, BACK 03/28/2009   Qualifier: Diagnosis of  By: Nori Riis MD, [name]    . CARPAL TUNNEL SYNDROME, LEFT 01/05/2008   Qualifier: Diagnosis of  By: Nori Riis MD, [name]    . Cataract   . Chronic bronchitis ([place])    "get it q yr"  . Chronic mid back pain   . Cognitive impairment 12/11/2013  . Complication of anesthesia 07/2008   "hard to get me woke up when I had my knee replaced; they said they had to Young me back"  . Dementia ([place])    "I have some; not dx'd by a dr" (08/06/2017)  . Diabetes mellitus type 2, diet-controlled ([place]) 08/05/2006       . Disorder of bursae and tendons in shoulder region 01/24/2009   Qualifier: Diagnosis of  By: Nori Riis MD, [name]    . Esophageal abnormality 08/12/2010   Barium  swallow 12/2011  IMPRESSION: Esophageal dysmotility.  No fixed esophageal narrowing or stricture. However, the barium tablet was transiently delayed at the GE junction.  Postsurgical changes at the GE junction. Gastroesophageal reflux is suspected, but could not be confirmed due to the patient's inability to clear her esophagus in the prone position.    . Esophageal stricture   . GERD (gastroesophageal reflux disease)   . Greater trochanteric bursitis of right hip 07/14/2019  . Hematemesis 08/04/2017  . Hiatal hernia   . History of gout 1970's  . Hyperlipidemia   . Hypertension   . Major depressive disorder, recurrent episode ([place]) 06/11/2006   Qualifier: Diagnosis of  By: Beryle Lathe    . Movement disorder   . Neuromuscular disorder ([place])   . Osteoarthritis of multiple joints 08/04/2006   S/p R TKR Significant OA continues to be a problem in her left knee. Lower back OA Complicated by her parkinsonism    . Parkinson's disease ([place])   . Pulmonary HTN ([place])    moderate with PASP 58mmHg on echo 09/2019  . 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 SCOLIOSIS 03/28/2009   Qualifier: Diagnosis of  By: Nori Riis MD, [name]    . Situational depression   . Substance abuse ([place])   . Tinnitus 02/15/2015   Unclear what this is from. Likely multifactorial.   . Type  II diabetes mellitus ([place])   . Ulcerative esophagitis   . Urticaria, idiopathic 12/21/2013   One month of urticarial rash that is consistent with hives. She also has a lot of dry skin.   Marland Kitchen UTI (lower urinary tract infection)     Patient Active Problem List   Diagnosis Date Noted  . Confusion 07/26/2020  . Chest pain 07/10/2020  . New onset atrial fibrillation ([place]) 07/10/2020  . Pulmonary HTN ([place])   . Hiatal hernia with GERD and esophagitis 08/20/2017  . Osteoarthritis of left knee 07/28/2017  . Dyspnea on exertion 11/14/2016  . Frequent falls 05/14/2016  . CKD stage 3 due to type 2 diabetes mellitus ([place]) 02/15/2015  .  Chronic pain syndrome 06/15/2014  . Cognitive impairment 12/11/2013  . B12 deficiency 10/17/2013  . Dysphagia 06/02/2013  . Parkinson's disease ([place]) 01/18/2013  . Substance abuse, episodic 09/20/2010  . Esophageal dysmotility 08/12/2010  . SCOLIOSIS 03/28/2009  . Diabetes mellitus type 2, diet-controlled ([place]) 08/05/2006  . Hyperlipidemia 08/05/2006  . Essential hypertension 08/05/2006  . Osteoarthritis of multiple joints 08/04/2006  . Major depressive disorder, recurrent episode ([place]) 06/11/2006    Past Surgical History:  Procedure Laterality Date  . ABDOMINAL HYSTERECTOMY    . BALLOON DILATION N/A 07/12/2020   Procedure: BALLOON DILATION;  Surgeon: Jackquline Denmark, MD;  Location[facility]COPY;  Service: Endoscopy;  Laterality: N/A;  . BIOPSY  07/12/2020   Procedure: BIOPSY;  Surgeon: Jackquline Denmark, MD;  Location: [facility];  Service: Endoscopy;;  . CHOLECYSTECTOMY OPEN    . COLON SURGERY    . COLONOSCOPY  07/08/2019  . DILATION AND CURETTAGE OF UTERUS    . ESOPHAGOGASTRIC FUNDOPLASTY     some type "esoph surgery" per pt  . ESOPHAGOGASTRODUODENOSCOPY (EGD) WITH PROPOFOL N/A 08/05/2017   Procedure: ESOPHAGOGASTRODUODENOSCOPY (EGD) WITH PROPOFOL;  Surgeon: Jerene Bears, MD;  Location: [facility];  Service: Gastroenterology;  Laterality: N/A;  . ESOPHAGOGASTRODUODENOSCOPY (EGD) WITH PROPOFOL N/A 07/12/2020   Procedure: ESOPHAGOGASTRODUODENOSCOPY (EGD) WITH PROPOFOL;  Surgeon: Jackquline Denmark, MD;  Location[facility]COPY;  Service: Endoscopy;  Laterality: N/A;  with dil  . JOINT REPLACEMENT    . TOTAL KNEE ARTHROPLASTY Right 07/2008  . TUBAL LIGATION       OB History   No obstetric history on file.     Family History  Problem Relation Age of Onset  . Heart disease Mother   . Diabetes Mother   . Heart disease Father   . Heart attack Sister   . Heart disease Sister   . Heart attack Brother   . Heart disease Brother   . Cerebral palsy Son   . Colon cancer Neg Hx   .  Esophageal cancer Neg Hx   . Liver cancer Neg Hx   . Pancreatic cancer Neg Hx   . Stomach cancer Neg Hx   . Rectal cancer Neg Hx     Social History   Tobacco Use  . Smoking status: Never Smoker  . Smokeless tobacco: Never Used  Vaping Use  . Vaping Use: Never used  Substance Use Topics  . Alcohol use: Yes    Comment: Occasional  . Drug use: No    Home Medications Prior to Admission medications   Medication Sig Start Date End Date Taking? Authorizing Provider  acetaminophen (TYLENOL) 325 MG tablet Take 2 tablets (650 mg total) by mouth every 4 (four) hours as needed for headache or mild pain. 07/13/20  Yes Simmons-Robinson, Riki Sheer, MD  apixaban (ELIQUIS) 5  MG TABS tablet Take 1 tablet (5 mg total) by mouth 2 (two) times daily. 07/13/20  Yes Simmons-Robinson, Makiera, MD  carbidopa-levodopa (SINEMET IR) 25-100 MG tablet Take 1 tablet by mouth 5 (five) times daily. 02/07/19  Yes Penumalli, Earlean Polka, MD  gabapentin (NEURONTIN) 300 MG capsule Take 300-600 mg by mouth See admin instructions. Take  2 capsules in the morning and 2 capsules at lunch 3 capsule in the evening as needed for pain 07/10/16  Yes Plovsky, Berneta Sages, MD  loperamide (IMODIUM A-D) 2 MG tablet Take 2 mg by mouth 4 (four) times daily as needed for diarrhea or loose stools.   Yes [provider]  Metoprolol Tartrate 75 MG TABS TAKE 1 TABLET BY MOUTH TWICE DAILY Patient taking differently: Take 75 mg by mouth in the morning and at bedtime. 07/15/20  Yes [name], MD  omeprazole (PRILOSEC) 40 MG capsule TAKE 1 CAPSULE BY MOUTH TWICE DAILY Patient taking differently: Take 40 mg by mouth in the morning and at bedtime. 08/24/19  Yes [name], MD  ondansetron (ZOFRAN) 4 MG tablet Take 1 tablet (4 mg total) by mouth every 8 (eight) hours as needed for nausea or vomiting. 07/10/20  Yes Ezequiel Essex, MD  sertraline (ZOLOFT) 100 MG tablet Take 1 tablet (100 mg total) by mouth daily. Per psych 07/13/20  Yes Simmons-Robinson,  Riki Sheer, MD  sucralfate (CARAFATE) 1 GM/10ML suspension Take 10 mLs (1 g total) by mouth 4 (four) times daily -  with meals and at bedtime. 07/13/20  Yes Simmons-Robinson, Makiera, MD  traMADol (ULTRAM) 50 MG tablet TAKE 1 TO 2 TABLETS BY MOUTH TWICE DAILY AS NEEDED FOR CHRONIC KNEE PAIN Patient taking differently: Take 50-100 mg by mouth 2 (two) times daily as needed (chronic knee pain). 02/14/20  Yes [name], MD  nystatin-triamcinolone ointment [facility]) Apply 1 application topically 3 (three) times daily. Patient not taking: Reported on 07/26/2020 07/16/20   Simmons-Robinson, Riki Sheer, MD    Allergies    Ace inhibitors, Azilect [rasagiline mesylate], and Lisinopril  Review of Systems   Review of Systems  Constitutional: Negative for chills and fever.  HENT: Negative for ear pain and sore throat.   Eyes: Negative for pain and visual disturbance.  Respiratory: Negative for cough and shortness of breath.   Cardiovascular: Positive for palpitations. Negative for chest pain.  Gastrointestinal: Negative for abdominal pain and vomiting.  Genitourinary: Negative for dysuria and hematuria.  Musculoskeletal: Negative for arthralgias and back pain.  Skin: Negative for color change and rash.  Neurological: Negative for seizures and syncope.  All other systems reviewed and are negative.   Physical Exam Updated Vital Signs BP (!) 168/114 (BP Location: Right Arm)   Pulse (!) 59   Temp 98.5 F (36.9 C) (Oral)   Resp 18   SpO2 99%   Physical Exam Vitals and nursing note reviewed.  Constitutional:      General: She is not in acute distress.    Appearance: She is well-developed.  HENT:     Head: Normocephalic and atraumatic.  Eyes:     Conjunctiva/sclera: Conjunctivae normal.  Cardiovascular:     Rate and Rhythm: Normal rate and regular rhythm.     Heart sounds: No murmur heard.   Pulmonary:     Effort: Pulmonary effort is normal. No respiratory distress.     Breath sounds: Normal  breath sounds.  Abdominal:     General: There is no distension.     Palpations: Abdomen is soft.  Tenderness: There is no abdominal tenderness. There is no right CVA tenderness or left CVA tenderness.  Musculoskeletal:        General: No swelling or tenderness. Normal range of motion.     Cervical back: Neck supple.  Skin:    General: Skin is warm and dry.  Neurological:     General: No focal deficit present.     Mental Status: She is alert and oriented to person, place, and time. Mental status is at baseline.     Cranial Nerves: No cranial nerve deficit.     ED Results / Procedures / Treatments   Labs (all labs ordered are listed, but only abnormal results are displayed) Labs Reviewed  COMPREHENSIVE METABOLIC PANEL - Abnormal; Notable for the following components:      Result Value   Potassium 3.3 (*)    Glucose, Bld 108 (*)    Creatinine, Ser 1.03 (*)    Total Bilirubin 1.7 (*)    GFR, Estimated 57 (*)    All other components within normal limits  URINALYSIS, ROUTINE W REFLEX MICROSCOPIC - Abnormal; Notable for the following components:   Hgb urine dipstick MODERATE (*)    Ketones, ur 20 (*)    Protein, ur 30 (*)    Nitrite POSITIVE (*)    Bacteria, UA MANY (*)    All other components within normal limits  TROPONIN I (HIGH SENSITIVITY) - Abnormal; Notable for the following components:   Troponin I (High Sensitivity) 18 (*)    All other components within normal limits  RESP PANEL BY RT-PCR (FLU A&B, COVID) ARPGX2  URINE CULTURE  CBC WITH DIFFERENTIAL/PLATELET  LIPASE, BLOOD  COMPREHENSIVE METABOLIC PANEL  CBC  BLOOD GAS, VENOUS  TROPONIN I (HIGH SENSITIVITY)    EKG EKG Interpretation  Date/Time:  Thursday July 26 2020 17:23:30 EDT Ventricular Rate:  59 PR Interval:  118 QRS Duration: 88 QT Interval:  490 QTC Calculation: 485 R Axis:   78 Text Interpretation: Sinus bradycardia Cannot rule out Anterior infarct , age undetermined Abnormal ECG Confirmed by  Nanda Quinton [phone]) on 07/26/2020 5:40:50 PM   Radiology DG Chest 2 View  Result Date: 07/26/2020 CLINICAL DATA:  Altered level of consciousness, hypertension EXAM: CHEST - 2 VIEW COMPARISON:  09/29/2018 FINDINGS: Frontal and lateral views of the chest demonstrate enlarged cardiac silhouette. There is central vascular congestion, with trace right pleural effusion. No airspace disease or pneumothorax. Degenerative changes of the bilateral shoulders. IMPRESSION: 1. Mild congestive heart failure. Electronically Signed   By: Randa Ngo M.D.   On: 07/26/2020 19:14   CT Head Wo Contrast  Result Date: 07/26/2020 CLINICAL DATA:  Mental status change.  Unknown cause. EXAM: CT HEAD WITHOUT CONTRAST TECHNIQUE: Contiguous axial images were obtained from the base of the skull through the vertex without intravenous contrast. COMPARISON:  CT head 07/10/2020 FINDINGS: Limited evaluation due to motion artifact. Brain: Cerebral ventricle sizes are concordant with the degree of cerebral volume loss. Patchy and confluent areas of decreased attenuation are noted throughout the deep and periventricular white matter of the cerebral hemispheres bilaterally, compatible with chronic microvascular ischemic disease. No evidence of large-territorial acute infarction. No parenchymal hemorrhage. No mass lesion. No extra-axial collection. No mass effect or midline shift. No hydrocephalus. Basilar cisterns are patent. Vascular: No hyperdense vessel. Skull: No acute displaced fracture or focal lesion. Sinuses/Orbits: Paranasal sinuses and mastoid air cells are clear. The orbits are unremarkable. Other: None. IMPRESSION: No acute intracranial abnormality. Please note limited evaluation due  to motion artifact. Electronically Signed   By: Iven Finn M.D.   On: 07/26/2020 18:59    Procedures Procedures   Medications Ordered in ED Medications  sertraline (ZOLOFT) tablet 100 mg (has no administration in time range)   pantoprazole (PROTONIX) EC tablet 40 mg (has no administration in time range)  sucralfate (CARAFATE) 1 GM/10ML suspension 1 g (has no administration in time range)  apixaban (ELIQUIS) tablet 5 mg (has no administration in time range)  0.9 %  sodium chloride infusion (has no administration in time range)  polyethylene glycol (MIRALAX / GLYCOLAX) packet 17 g (has no administration in time range)  carbidopa-levodopa (SINEMET IR) 25-100 MG per tablet immediate release 1 tablet (has no administration in time range)  potassium chloride SA (KLOR-CON) CR tablet 20 mEq (has no administration in time range)  cefTRIAXone (ROCEPHIN) 1 g in sodium chloride 0.9 % 100 mL IVPB (has no administration in time range)    ED Course  I have reviewed the triage vital signs and the nursing notes.  Pertinent labs & imaging results that were available during my care of the patient were reviewed by me and considered in my medical decision making (see chart for details).    MDM Rules/Calculators/A&P                           Medical Decision Making:  [name] is a 75 y.o. female, who presented to the ED today with altered mental status of the past 3 days.   On my initial exam, the pt was in no acute distress the patient was with intermittent waxing waning confusion.   Reviewed and confirmed nursing documentation for past medical history, family history, social history.  Patient sister present illness and physical exam findings are most concerning for delirium and global altered mental status.  Neurologic exam without any focal neurologic abnormalities.  Discussed situation with patient's family at bedside who agree with the above.  Favor urinary tract infection as a likely source of her delirium.  Given altered mental status, will admit to medicine for continued care and management.  Pending urinalysis results at this time. Disposition: Based on the above findings, I believe patient is stable for  admission.   Patient/family educated about specific findings on our evaluation and explained exact reasons for admission.  Patient/family educated about clinical situation and time was allowed to answer questions.   Admission team communicated with and agreed with need for admission. Patient admitted. Patient ready to move at this time.    Emergency Department Medication Summary: Medications  sertraline (ZOLOFT) tablet 100 mg (has no administration in time range)  pantoprazole (PROTONIX) EC tablet 40 mg (has no administration in time range)  sucralfate (CARAFATE) 1 GM/10ML suspension 1 g (has no administration in time range)  apixaban (ELIQUIS) tablet 5 mg (has no administration in time range)  0.9 %  sodium chloride infusion (has no administration in time range)  polyethylene glycol (MIRALAX / GLYCOLAX) packet 17 g (has no administration in time range)  carbidopa-levodopa (SINEMET IR) 25-100 MG per tablet immediate release 1 tablet (has no administration in time range)  potassium chloride SA (KLOR-CON) CR tablet 20 mEq (has no administration in time range)  cefTRIAXone (ROCEPHIN) 1 g in sodium chloride 0.9 % 100 mL IVPB (has no administration in time range)    Final Clinical Impression(s) / ED Diagnoses Final diagnoses:  Altered mental status, unspecified altered mental status type  Rx / DC Orders ED Discharge Orders    None       Tretha Sciara, MD 07/27/20 Kelle Darting, MD 07/29/20 [phone]

## 2020-07-26 NOTE — ED Notes (Signed)
Pt assisted to restroom. Collection container placed in toilet to collect urine specimen. Pt urinated but missed the collection container.

## 2020-07-27 ENCOUNTER — Other Ambulatory Visit: Payer: Self-pay

## 2020-07-27 DIAGNOSIS — R4182 Altered mental status, unspecified: Secondary | ICD-10-CM | POA: Diagnosis not present

## 2020-07-27 LAB — CBC
HCT: 37.7 % (ref 36.0–46.0)
Hemoglobin: 12.6 g/dL (ref 12.0–15.0)
MCH: 32.1 pg (ref 26.0–34.0)
MCHC: 33.4 g/dL (ref 30.0–36.0)
MCV: 95.9 fL (ref 80.0–100.0)
Platelets: 238 10*3/uL (ref 150–400)
RBC: 3.93 MIL/uL (ref 3.87–5.11)
RDW: 14.1 % (ref 11.5–15.5)
WBC: 7.5 10*3/uL (ref 4.0–10.5)
nRBC: 0 % (ref 0.0–0.2)

## 2020-07-27 LAB — HIV ANTIBODY (ROUTINE TESTING W REFLEX): HIV Screen 4th Generation wRfx: NONREACTIVE

## 2020-07-27 LAB — COMPREHENSIVE METABOLIC PANEL
ALT: 19 U/L (ref 0–44)
AST: 25 U/L (ref 15–41)
Albumin: 3.5 g/dL (ref 3.5–5.0)
Alkaline Phosphatase: 59 U/L (ref 38–126)
Anion gap: 13 (ref 5–15)
BUN: 12 mg/dL (ref 8–23)
CO2: 22 mmol/L (ref 22–32)
Calcium: 8.9 mg/dL (ref 8.9–10.3)
Chloride: 104 mmol/L (ref 98–111)
Creatinine, Ser: 1.04 mg/dL — ABNORMAL HIGH (ref 0.44–1.00)
GFR, Estimated: 56 mL/min — ABNORMAL LOW (ref >60)
Glucose, Bld: 86 mg/dL (ref 70–99)
Potassium: 3.3 mmol/L — ABNORMAL LOW (ref 3.5–5.1)
Sodium: 139 mmol/L (ref 135–145)
Total Bilirubin: 1.7 mg/dL — ABNORMAL HIGH (ref 0.3–1.2)
Total Protein: 6.3 g/dL — ABNORMAL LOW (ref 6.5–8.1)

## 2020-07-27 LAB — VITAMIN B12: Vitamin B-12: 432 pg/mL (ref 180–914)

## 2020-07-27 LAB — I-STAT VENOUS BLOOD GAS, ED
Acid-Base Excess: 1 mmol/L (ref 0.0–2.0)
Bicarbonate: 25.8 mmol/L (ref 20.0–28.0)
Calcium, Ion: 1.11 mmol/L — ABNORMAL LOW (ref 1.15–1.40)
HCT: 35 % — ABNORMAL LOW (ref 36.0–46.0)
Hemoglobin: 11.9 g/dL — ABNORMAL LOW (ref 12.0–15.0)
O2 Saturation: 100 %
Potassium: 3.1 mmol/L — ABNORMAL LOW (ref 3.5–5.1)
Sodium: 142 mmol/L (ref 135–145)
TCO2: 27 mmol/L (ref 22–32)
pCO2, Ven: 40.8 mmHg — ABNORMAL LOW (ref 44.0–60.0)
pH, Ven: 7.409 (ref 7.250–7.430)
pO2, Ven: 185 mmHg — ABNORMAL HIGH (ref 32.0–45.0)

## 2020-07-27 LAB — TSH: TSH: 1.252 u[IU]/mL (ref 0.350–4.500)

## 2020-07-27 MED ORDER — SERTRALINE HCL 50 MG PO TABS
50.0000 mg | ORAL_TABLET | Freq: Every day | ORAL | Status: DC
Start: 2020-07-27 — End: 2020-07-30
  Administered 2020-07-28 – 2020-07-30 (×3): 50 mg via ORAL
  Filled 2020-07-27 (×3): qty 1

## 2020-07-27 MED ORDER — SODIUM CHLORIDE 0.9 % IV SOLN: INTRAVENOUS | Status: AC

## 2020-07-27 MED ORDER — POTASSIUM CHLORIDE 20 MEQ PO PACK: 20.0000 meq | PACK | Freq: Once | ORAL | Status: DC

## 2020-07-27 MED ORDER — ACETAMINOPHEN 325 MG PO TABS
650.0000 mg | ORAL_TABLET | Freq: Four times a day (QID) | ORAL | Status: DC | PRN
  Administered 2020-07-27: 650 mg via ORAL
  Filled 2020-07-27 (×2): qty 2

## 2020-07-27 MED ORDER — ACETAMINOPHEN 160 MG/5ML PO SOLN
650.0000 mg | Freq: Four times a day (QID) | ORAL | Status: DC | PRN
  Administered 2020-07-27 – 2020-07-30 (×3): 650 mg via ORAL
  Filled 2020-07-27 (×3): qty 20.3

## 2020-07-27 NOTE — ED Notes (Signed)
Pt assisted to restroom by NT via w/c.

## 2020-07-27 NOTE — ED Notes (Signed)
Pt assisted to restroom.  

## 2020-07-27 NOTE — Care Management Obs Status (Signed)
[place] NOTIFICATION   Patient Details  Name: [name] MRN: [id number] Date of Birth: [birth year]   Medicare Observation Status Notification Given:  Yes    Bartholomew Crews, RN 07/27/2020, 3:24 PM

## 2020-07-27 NOTE — Progress Notes (Signed)
NEW ADMISSION NOTE New Admission Note:   Arrival Method: Stretcher Mental Orientation: AAOx1 Telemetry: Mx40-05 Assessment: Completed Skin: Intact IV: LAC, RWrist Pain:  Tubes: n/a Safety Measures: Safety Fall Prevention Plan has been given, discussed and signed Admission: Completed [facility] Orientation: Patient has been orientated to the room, unit and staff.  Family: husband at the bedside  Orders have been reviewed and implemented. Will continue to monitor the patient. Call light has been placed within reach and bed alarm has been activated.   Vira Agar, RN

## 2020-07-27 NOTE — ED Notes (Signed)
Pt failed stroke swallow screen, notified Dr. Gwendlyn Deutscher. Will place SLP evaluation.

## 2020-07-27 NOTE — Plan of Care (Signed)
  Problem: Education: Goal: Knowledge of General Education information will improve Description Including pain rating scale, medication(s)/side effects and non-pharmacologic comfort measures Outcome: Progressing   Problem: Clinical Measurements: Goal: Ability to maintain clinical measurements within normal limits will improve Outcome: Progressing   Problem: Activity: Goal: Risk for activity intolerance will decrease Outcome: Progressing   

## 2020-07-27 NOTE — Progress Notes (Signed)
This RN was informed by CCMD that patient had a 7 beat run of SVT at 1549.  MD made aware. Will continue to monitor.  Aurther Loft, RN

## 2020-07-27 NOTE — Progress Notes (Signed)
FPTS Interim Progress Note  S: Went to bedside to assess patient. Husband present at bedside. Patient denies any pain, although appears to be improving but still not following all commands. Husband reports that just prior to my arrival, patient got up and started talking to him before going back to sleep.   O: BP (!) 169/68   Pulse 60   Temp 98.1 F (36.7 C) (Oral)   Resp 18   SpO2 98%   General: Patient sleeping comfortably, in no acute distress. Resp: normal work of breathing, breathing comfortably on room air Neuro: AOx1 (answers I don't know to many questions), gross sensation intact, no facial asymmetry, 5/5 UE and LE strength bilaterally, able to sit up spontaneously on the sit of the bed   Neurological examination limited by patient's mental status although improved.   A/P: -husband unsure of exact timings of carbidopa-levadopa but he knows that it is 5 times daily as prescribed with last dose before bedtime, home dose ordered  -no further neurological interventions at this time, per Dr. Leonel Ramsay  -NPO given failed swallow study -continue to monitor mental status, hopeful to continue to improve with antibiotics     Donney Dice, DO 07/27/2020, 2:51 PM PGY-1, [place] Service pager [phone]

## 2020-07-27 NOTE — Evaluation (Signed)
Clinical/Bedside Swallow Evaluation Patient Details  Name: [name] MRN: [id number] Date of Birth: [birth year]  Today's Date: 07/27/2020 Time: SLP Start Time (ACUTE ONLY): 9798 SLP Stop Time (ACUTE ONLY): 1543 SLP Time Calculation (min) (ACUTE ONLY): 24.63 min  Past Medical History:  Past Medical History:  Diagnosis Date  . Abdominal pain, chronic, right upper quadrant 09/22/2019  . Acute esophagitis   . Allergy   . Anxiety   . Arthritis    "knees, back" (06/29/2014)  . ARTHRITIS, BACK 03/28/2009   Qualifier: Diagnosis of  By: Nori Riis MD, [name]    . CARPAL TUNNEL SYNDROME, LEFT 01/05/2008   Qualifier: Diagnosis of  By: Nori Riis MD, [name]    . Cataract   . Chronic bronchitis ([place])    "get it q yr"  . Chronic mid back pain   . Cognitive impairment 12/11/2013  . Complication of anesthesia 07/2008   "hard to get me woke up when I had my knee replaced; they said they had to bring me back"  . Dementia ([place])    "I have some; not dx'd by a dr" (08/06/2017)  . Diabetes mellitus type 2, diet-controlled ([place]) 08/05/2006       . Disorder of bursae and tendons in shoulder region 01/24/2009   Qualifier: Diagnosis of  By: Nori Riis MD, [name]    . Esophageal abnormality 08/12/2010   Barium swallow 12/2011  IMPRESSION: Esophageal dysmotility.  No fixed esophageal narrowing or stricture. However, the barium tablet was transiently delayed at the GE junction.  Postsurgical changes at the GE junction. Gastroesophageal reflux is suspected, but could not be confirmed due to the patient's inability to clear her esophagus in the prone position.    . Esophageal stricture   . GERD (gastroesophageal reflux disease)   . Greater trochanteric bursitis of right hip 07/14/2019  . Hematemesis 08/04/2017  . Hiatal hernia   . History of gout 1970's  . Hyperlipidemia   . Hypertension   . Major depressive disorder, recurrent episode ([place]) 06/11/2006   Qualifier: Diagnosis of  By: Beryle Lathe    . Movement disorder   .  Neuromuscular disorder ([place])   . Osteoarthritis of multiple joints 08/04/2006   S/p R TKR Significant OA continues to be a problem in her left knee. Lower back OA Complicated by her parkinsonism    . Parkinson's disease ([place])   . Pulmonary HTN ([place])    moderate with PASP 7mmHg on echo 09/2019  . 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 SCOLIOSIS 03/28/2009   Qualifier: Diagnosis of  By: Nori Riis MD, [name]    . Situational depression   . Substance abuse ([place])   . Tinnitus 02/15/2015   Unclear what this is from. Likely multifactorial.   . Type II diabetes mellitus ([place])   . Ulcerative esophagitis   . Urticaria, idiopathic 12/21/2013   One month of urticarial rash that is consistent with hives. She also has a lot of dry skin.   Marland Kitchen UTI (lower urinary tract infection)    Past Surgical History:  Past Surgical History:  Procedure Laterality Date  . ABDOMINAL HYSTERECTOMY    . BALLOON DILATION N/A 07/12/2020   Procedure: BALLOON DILATION;  Surgeon: Jackquline Denmark, MD;  [facility] ENDOSCOPY;  Service: Endoscopy;  Laterality: N/A;  . BIOPSY  07/12/2020   Procedure: BIOPSY;  Surgeon: Jackquline Denmark, MD;  Location: [place];  Service: Endoscopy;;  . CHOLECYSTECTOMY OPEN    . COLON SURGERY    . COLONOSCOPY  07/08/2019  .  DILATION AND CURETTAGE OF UTERUS    . ESOPHAGOGASTRIC FUNDOPLASTY     some type "esoph surgery" per pt  . ESOPHAGOGASTRODUODENOSCOPY (EGD) WITH PROPOFOL N/A 08/05/2017   Procedure: ESOPHAGOGASTRODUODENOSCOPY (EGD) WITH PROPOFOL;  Surgeon: Jerene Bears, MD;  Location: [place];  Service: Gastroenterology;  Laterality: N/A;  . ESOPHAGOGASTRODUODENOSCOPY (EGD) WITH PROPOFOL N/A 07/12/2020   Procedure: ESOPHAGOGASTRODUODENOSCOPY (EGD) WITH PROPOFOL;  Surgeon: Jackquline Denmark, MD;  Location: [facility] ENDOSCOPY;  Service: Endoscopy;  Laterality: N/A;  with dil  . JOINT REPLACEMENT    . TOTAL KNEE ARTHROPLASTY Right 07/2008  . TUBAL LIGATION     HPI:  Pt is  is a 75 y.o. female  with PMH significant for hypertension, pulmonary hypertension, DM2, hyperlipidemia, CKD stage III, Parkinson's disease. Pt was recently discharged 4/1, and evaluated for new-onset A.fib with RVR; Esophagram 3/30: narrowed appearance at the level of the distal esophagus  and GE junction; pt s/p esophageal dilation. Pt presented to the ED on 4/14 with worsening confusion. Pt found to have UTI. Pt was made NPO pending swallow evaluation on admission. Pt failed the [employer] screen ib 4/15 since she stopped drinking and SLP services were ordered on 4/15.Pt received outpatient SLP services in 2017 for dysarthria.   Assessment / Plan / Recommendation Clinical Impression  Pt was seen for bedside swallow evaluation with her husband present for part of the evaluation. Pt's husband denied the pt having any symptoms of oropharyngeal dysphagia at baseline. Oral mechanism exam was limited due to pt's difficulty following commands; however, oral motor strength and ROM appeared grossly WFL. Dentition was reduced, but adequate for mastication. She tolerated all solids and liquids without signs or symptoms of oropharyngeal dysphagia. Mastication was mildly prolonged with intake of hard pretzels, but WFL. A regular texture diet with thin liquids is recommended at this time. Considering pt's acute confusion and underlying Parkinson's disease, SLP will see pt once more to ensure diet tolerance. SLP Visit Diagnosis: Dysphagia, unspecified (R13.10)    Aspiration Risk  Mild aspiration risk    Diet Recommendation Regular;Thin liquid   Liquid Administration via: Cup;Straw Medication Administration: Whole meds with puree Supervision: Staff to assist with self feeding Compensations: Slow rate;Minimize environmental distractions Postural Changes: Seated upright at 90 degrees    Other  Recommendations Oral Care Recommendations: Oral care BID   Follow up Recommendations None      Frequency and Duration min 1 x/week  1  week       Prognosis Prognosis for Safe Diet Advancement: Good      Swallow Study   General Date of Onset: 07/26/20 HPI: Pt is  is a 75 y.o. female with PMH significant for hypertension, pulmonary hypertension, DM2, hyperlipidemia, CKD stage III, Parkinson's disease. Pt was recently discharged 4/1, and evaluated for new-onset A.fib with RVR; Esophagram 3/30: narrowed appearance at the level of the distal esophagus  and GE junction; pt s/p esophageal dilation. Pt presented to the ED on 4/14 with worsening confusion. Pt found to have UTI. Pt was made NPO pending swallow evaluation on admission. Pt failed the [employer] screen ib 4/15 since she stopped drinking and SLP services were ordered on 4/15.Pt received outpatient SLP services in 2017 for dysarthria. Type of Study: Bedside Swallow Evaluation Previous Swallow Assessment: none Diet Prior to this Study: NPO Temperature Spikes Noted: No Respiratory Status: Room air History of Recent Intubation: No Behavior/Cognition: Alert;Cooperative;Confused;Pleasant mood Oral Cavity Assessment: Within Functional Limits Oral Care Completed by SLP: No Oral Cavity - Dentition: Adequate  natural dentition;Missing dentition Vision: Functional for self-feeding Self-Feeding Abilities: Needs assist Patient Positioning: Upright in bed;Postural control adequate for testing Baseline Vocal Quality: Normal Volitional Cough: Cognitively unable to elicit Volitional Swallow: Able to elicit    Oral/Motor/Sensory Function Overall Oral Motor/Sensory Function: Within functional limits   Ice Chips Ice chips: Within functional limits Presentation: Spoon   Thin Liquid Thin Liquid: Within functional limits Presentation: Cup;Straw    Nectar Thick Nectar Thick Liquid: Not tested   Honey Thick Honey Thick Liquid: Not tested   Puree Puree: Within functional limits Presentation: Spoon   Solid     Solid: Within functional limits Presentation: [place]  I. Hardin Negus, [place], [place] Office number [phone] Pager [phone]  Horton Marshall 07/27/2020,4:00 PM

## 2020-07-27 NOTE — Evaluation (Signed)
Physical Therapy Evaluation Patient Details Name: [name] MRN: [id number] DOB: 02-Oct-1945 Today's Date: 07/27/2020   History of Present Illness  Pt adm 4/14 with confusion. Encephalopathy likely infectious with UTI source.  PMH - dementia, HTN, Pulmonary HTN, DM2, CKD stage III, Parkinson's disease, substance abuse.  Clinical Impression  Pt known to me from prior visit. Pt requiring min guard assist for mobility. Expect she will return to baseline as cognition improves. After last admission the plan was for pt to go to [facility] neuro [facility]hab that has Parkinson's program. Unsure if pt had been able to start that program. Recommend that again if pt and husband are agreeable.     Follow Up Recommendations Outpatient PT;Supervision for mobility/OOB    Equipment Recommendations  None recommended by PT    Recommendations for Other Services       Precautions / Restrictions Precautions Precautions: Fall Restrictions Weight Bearing Restrictions: No      Mobility  Bed Mobility Overal bed mobility: Needs Assistance Bed Mobility: Supine to Sit;Sit to Supine     Supine to sit: Min guard;HOB elevated Sit to supine: Min assist   General bed mobility comments: Incr time and effort to come to EOB. Assist to bring legs back up into bed returning to supine    Transfers Overall transfer level: Needs assistance Equipment used: 4-wheeled walker Transfers: Sit to/from Stand Sit to Stand: Min guard         General transfer comment: Assist for safety  Ambulation/Gait Ambulation/Gait assistance: Min guard Gait Distance (Feet): 250 Feet Assistive device: 4-wheeled walker Gait Pattern/deviations: Step-through pattern;Decreased stride length Gait velocity: decr Gait velocity interpretation: 1.31 - 2.62 ft/sec, indicative of limited community ambulator General Gait Details: Assist for safety and cues to pay attention to obstacles  Stairs            Wheelchair Mobility     Modified Rankin (Stroke Patients Only)       Balance Overall balance assessment: Needs assistance Sitting-balance support: No upper extremity supported Sitting balance-Leahy Scale: Fair     Standing balance support: Single extremity supported;During functional activity Standing balance-Leahy Scale: Poor Standing balance comment: UE support for static standing                             Pertinent Vitals/Pain Pain Assessment: Faces Faces Pain Scale: No hurt    Home Living Family/patient expects to be discharged to:: Private residence Living Arrangements: Spouse/significant other Available Help at Discharge: Family;Available 24 hours/day Type of Home: House Home Access: Stairs to enter Entrance Stairs-Rails: Right;Left;Can reach both Entrance Stairs-Number of Steps: 4 Home Layout: One level Home Equipment: Walker - 4 wheels;Tub bench Additional Comments: Aide 2x/wk for 4 hours    Prior Function Level of Independence: Needs assistance   Gait / Transfers Assistance Needed: Uses rollator           Hand Dominance        Extremity/Trunk Assessment   Upper Extremity Assessment Upper Extremity Assessment: Defer to OT evaluation    Lower Extremity Assessment Lower Extremity Assessment: Generalized weakness       Communication      Cognition Arousal/Alertness: Awake/alert Behavior During Therapy: Flat affect Overall Cognitive Status: Impaired/Different from baseline Area of Impairment: Orientation;Attention;Memory;Following commands;Safety/judgement;Awareness;Problem solving                 Orientation Level: Disoriented to;Place;Time;Situation Current Attention Level: Sustained Memory: Decreased short-term memory Following Commands: Follows one  step commands consistently;Follows one step commands with increased time Safety/Judgement: Decreased awareness of safety;Decreased awareness of deficits Awareness: Intellectual Problem Solving:  Slow processing;Decreased initiation;Requires verbal cues;Requires tactile cues        General Comments      Exercises     Assessment/Plan    PT Assessment Patient needs continued PT services  PT Problem List Decreased strength;Decreased balance;Decreased mobility;Decreased safety awareness       PT Treatment Interventions DME instruction;Gait training;Functional mobility training;Therapeutic activities;Therapeutic exercise;Balance training;Patient/family education    PT Goals (Current goals can be found in the Care Plan section)  Acute Rehab PT Goals Patient Stated Goal: not stated PT Goal Formulation: Patient unable to participate in goal setting Time For Goal Achievement: 08/10/20 Potential to Achieve Goals: Good    Frequency Min 3X/week   Barriers to discharge        Co-evaluation               AM-PAC PT "6 Clicks" Mobility  Outcome Measure Help needed turning from your back to your side while in a flat bed without using bedrails?: A Little Help needed moving from lying on your back to sitting on the side of a flat bed without using bedrails?: A Little Help needed moving to and from a bed to a chair (including a wheelchair)?: A Little Help needed standing up from a chair using your arms (e.g., wheelchair or bedside chair)?: A Little Help needed to walk in hospital room?: A Little Help needed climbing 3-5 steps with a railing? : A Little 6 Click Score: 18    End of Session Equipment Utilized During Treatment: Gait belt Activity Tolerance: Patient tolerated treatment well Patient left: in bed;with call bell/phone within reach;with bed alarm set Nurse Communication: Mobility status PT Visit Diagnosis: Other abnormalities of gait and mobility (R26.89);Muscle weakness (generalized) (M62.81)    Time: 1550-1605 PT Time Calculation (min) (ACUTE ONLY): 15 min   Charges:   PT Evaluation $PT Eval Moderate Complexity: [place] Pager [phone] Office [place] 07/27/2020, 4:50 PM

## 2020-07-27 NOTE — Progress Notes (Signed)
FPTS Interim Progress Note  Although mental status changes likely multifactorial due to delirium secondary to UTI and possibly due to progressive Parkinson's disease, possible concern for stroke. Consulted neurology, spoke to Dr. Leonel Ramsay who recommends that mental status changes likely due to delirium from UTI along with neurodegenerative changes from Parkinson's, no further intervention at this time. Although neurological exam limited given altered status that waxes and wanes, no focal findings noted. Given this, Dr. Leonel Ramsay recommends continued antibiotic treatment for a few days. Recommended to touch base with neurology in a few days if mental status does not improve in the next few days after antibiotic treatment. Also that primary team should continue carbidopa-levadopa the same as home dose. No further neurology intervention at this time, greatly appreciate recommendations of Dr. Leonel Ramsay.     Donney Dice, DO 07/27/2020, 1:53 PM PGY-1, [facility] Service pager [phone]

## 2020-07-27 NOTE — Progress Notes (Signed)
Family Medicine Teaching Service Daily Progress Note Intern Pager: [phone]  [name] Medical record number: [id number] Date of birth: [birth year] Age: 75 y.o. Gender: female  Primary Care Provider: [name], MD Consultants: None  Code Status: Full   Pt Overview and Major Events to Date:  4/14: Admitted   Assessment and Plan: [name] is a 75 y.o. female presenting with worsening confusion. Recently discharged 4/1, evaluated for new-onset A.fib with RVR and dysphagia s/p esophageal dilation. PMH is significant for hypertension, pulmonary hypertension,DM2,hyperlipidemia,CKD stage III, Parkinson's disease.   AMS likely 2/2 UTI CT head w/o contrast negative for acute abnormality. Mental status waxing and waning, apparently minimal change from admission. AOx1, able to follow some commands but neurological exam limited by mental status changes. Likely secondary to delirium from UTI and possibly progressive decline with history of Parkinson's disease.  - consider consult to neuro given concern for possible strong and her advanced parkinsons - follow up urine culture - mIVF with NS at 100 mL/hr - ceftriaxone 1g IV daily for empiric UTI treatment - pending urine culture  - vitals per unit routine - delirium precautions - PT/OT eval and treat   Recent new-onset A. Fib with RVR  Pulmonary HTN Discharged 3/31 after admission for new-onset A. Fib with RVR on Eliquis 5 mg BID, metoprolol 75 mg BID. Admission EKG appears to be A. Flutter with bradycardia to 59 bpm. Atrial flutter resolved on most recent EKG.  -continuous cardiac monitoring -continue Eliquis -continue to hold home metoprolol due to bradyacardia -monitor EKG -vitals per unit routine  Elevated blood pressures  BP 149/99 this morning with multiple hypertensive episodes. -continue to monitor BP  Hypokalemia Potassium on admission 3.3, currently 3.3. Patient spit out previous  supplementation ordered. - Kdur 20 mEq reordered - monitor BMP  Hx Dysphgia  S/p esophageal dilation 3/31 with GI while admitted. Last discharged 3/31 on zofran 4 mg q8 PRN,  Sucralfate 1g qAC & qHS. Not currently complaining of dysphagia or throat pain. Failed swallow study. - continue sucralfate - protonix 40 mg daily - if nauseous during admission, will determine best med at that time - NPO  Depression  anxiety  Parkinson's disease Home meds include zoloft 100 mg daily, carbidopa-levodopa 25-100 mg 5 times daily -zoloft 50 mg daily -carbidopa-levidopa, with plan to taper   Chronic Knee Pain Home meds include gabapentin 300 mg tablets PRN for pain (2 capsules AM, 2 capsules lunch, 3 capsules PM) and tramadol 50 mg BID PRN.  - decline to restart at admission out of concern for AMS  FEN/GI: NPO  PPx: Eliquis    Status is: Observation  The patient remains OBS appropriate and will d/c before 2 midnights.  Dispo: The patient is from: Home              Anticipated d/c is to: Home              Patient currently is not medically stable to d/c.   Difficult to place patient No        Subjective:  History primarily provided by husband at bedside. He states that last known normal was Tuesday night, she uses a walker for ambulation but when she is home rarely uses one. He did not notice any localized weakness, facial droop. Husband states that this is not her baseline, she requires some assistance with daily tasks including bathing but otherwise is able to feed herself. Over the past few days, he reports  that she has not wanted to eat, take her medications or even wanted to have a conversation with him.   Objective: Temp:  [98.1 F (36.7 C)-98.9 F (37.2 C)] 98.9 F (37.2 C) (04/15 0518) Pulse Rate:  [58-99] 99 (04/15 0518) Resp:  [17-20] 19 (04/15 0518) BP: (117-168)/(94-114) 149/99 (04/15 0518) SpO2:  [96 %-99 %] 98 % (04/15 0518) Physical Exam: General: Patient laying  in bed comfortably, in no acute distress. Cardiovascular: RRR, no murmurs or gallops auscultated  Respiratory: CTAB, no wheezing, rales or rhonchi  Abdomen: soft, nontender, BS+ Extremities: radial and distal pulses strong and equal bilaterally, no LE edema noted bilaterally  Neuro: AOx1 (name only), follows some commands, pill-rolling and resting tremor noted with rigidity of muscle tone, gross sensation intact, no facial asymmetry or droop, 4/5 strength UE and LE although may be limited by patient being able to follow all commands  Psych: pleasant   Neurological examination limited by mental status.   Laboratory: Recent Labs  Lab 07/26/20 1733 07/27/20 0117 07/27/20 0500  WBC 7.6  --  7.5  HGB 13.1 11.9* 12.6  HCT 38.8 35.0* 37.7  PLT 255  --  238   Recent Labs  Lab 07/26/20 1733 07/27/20 0117 07/27/20 0500  NA 138 142 139  K 3.3* 3.1* 3.3*  CL 104  --  104  CO2 23  --  22  BUN 9  --  12  CREATININE 1.03*  --  1.04*  CALCIUM 9.2  --  8.9  PROT 6.9  --  6.3*  BILITOT 1.7*  --  1.7*  ALKPHOS 60  --  59  ALT 21  --  19  AST 23  --  25  GLUCOSE 108*  --  86      Imaging/Diagnostic Tests: DG Chest 2 View  Result Date: 07/26/2020 CLINICAL DATA:  Altered level of consciousness, hypertension EXAM: CHEST - 2 VIEW COMPARISON:  09/29/2018 FINDINGS: Frontal and lateral views of the chest demonstrate enlarged cardiac silhouette. There is central vascular congestion, with trace right pleural effusion. No airspace disease or pneumothorax. Degenerative changes of the bilateral shoulders. IMPRESSION: 1. Mild congestive heart failure. Electronically Signed   By: Randa Ngo M.D.   On: 07/26/2020 19:14   CT Head Wo Contrast  Result Date: 07/26/2020 CLINICAL DATA:  Mental status change.  Unknown cause. EXAM: CT HEAD WITHOUT CONTRAST TECHNIQUE: Contiguous axial images were obtained from the base of the skull through the vertex without intravenous contrast. COMPARISON:  CT head  07/10/2020 FINDINGS: Limited evaluation due to motion artifact. Brain: Cerebral ventricle sizes are concordant with the degree of cerebral volume loss. Patchy and confluent areas of decreased attenuation are noted throughout the deep and periventricular white matter of the cerebral hemispheres bilaterally, compatible with chronic microvascular ischemic disease. No evidence of large-territorial acute infarction. No parenchymal hemorrhage. No mass lesion. No extra-axial collection. No mass effect or midline shift. No hydrocephalus. Basilar cisterns are patent. Vascular: No hyperdense vessel. Skull: No acute displaced fracture or focal lesion. Sinuses/Orbits: Paranasal sinuses and mastoid air cells are clear. The orbits are unremarkable. Other: None. IMPRESSION: No acute intracranial abnormality. Please note limited evaluation due to motion artifact. Electronically Signed   By: Iven Finn M.D.   On: 07/26/2020 18:59    Donney Dice, DO 07/27/2020, 7:27 AM PGY-1, [place] Intern pager: [phone], text pages welcome

## 2020-07-28 DIAGNOSIS — F05 Delirium due to known physiological condition: Secondary | ICD-10-CM | POA: Diagnosis present

## 2020-07-28 DIAGNOSIS — M1712 Unilateral primary osteoarthritis, left knee: Secondary | ICD-10-CM | POA: Diagnosis present

## 2020-07-28 DIAGNOSIS — N183 Chronic kidney disease, stage 3 unspecified: Secondary | ICD-10-CM | POA: Diagnosis present

## 2020-07-28 DIAGNOSIS — I5032 Chronic diastolic (congestive) heart failure: Secondary | ICD-10-CM | POA: Diagnosis present

## 2020-07-28 DIAGNOSIS — Z20822 Contact with and (suspected) exposure to covid-19: Secondary | ICD-10-CM | POA: Diagnosis present

## 2020-07-28 DIAGNOSIS — I130 Hypertensive heart and chronic kidney disease with heart failure and stage 1 through stage 4 chronic kidney disease, or unspecified chronic kidney disease: Secondary | ICD-10-CM | POA: Diagnosis present

## 2020-07-28 DIAGNOSIS — R4182 Altered mental status, unspecified: Secondary | ICD-10-CM | POA: Diagnosis not present

## 2020-07-28 DIAGNOSIS — R001 Bradycardia, unspecified: Secondary | ICD-10-CM | POA: Diagnosis present

## 2020-07-28 DIAGNOSIS — I4892 Unspecified atrial flutter: Secondary | ICD-10-CM | POA: Diagnosis present

## 2020-07-28 DIAGNOSIS — N179 Acute kidney failure, unspecified: Secondary | ICD-10-CM | POA: Diagnosis present

## 2020-07-28 DIAGNOSIS — I272 Pulmonary hypertension, unspecified: Secondary | ICD-10-CM | POA: Diagnosis present

## 2020-07-28 DIAGNOSIS — I4891 Unspecified atrial fibrillation: Secondary | ICD-10-CM | POA: Diagnosis present

## 2020-07-28 DIAGNOSIS — E876 Hypokalemia: Secondary | ICD-10-CM | POA: Diagnosis present

## 2020-07-28 DIAGNOSIS — E1122 Type 2 diabetes mellitus with diabetic chronic kidney disease: Secondary | ICD-10-CM | POA: Diagnosis present

## 2020-07-28 DIAGNOSIS — Z66 Do not resuscitate: Secondary | ICD-10-CM | POA: Diagnosis present

## 2020-07-28 DIAGNOSIS — G9349 Other encephalopathy: Secondary | ICD-10-CM | POA: Diagnosis present

## 2020-07-28 DIAGNOSIS — B961 Klebsiella pneumoniae [K. pneumoniae] as the cause of diseases classified elsewhere: Secondary | ICD-10-CM | POA: Diagnosis present

## 2020-07-28 DIAGNOSIS — R4701 Aphasia: Secondary | ICD-10-CM | POA: Diagnosis present

## 2020-07-28 DIAGNOSIS — F419 Anxiety disorder, unspecified: Secondary | ICD-10-CM | POA: Diagnosis present

## 2020-07-28 DIAGNOSIS — R296 Repeated falls: Secondary | ICD-10-CM | POA: Diagnosis present

## 2020-07-28 DIAGNOSIS — E785 Hyperlipidemia, unspecified: Secondary | ICD-10-CM | POA: Diagnosis present

## 2020-07-28 DIAGNOSIS — F0280 Dementia in other diseases classified elsewhere without behavioral disturbance: Secondary | ICD-10-CM | POA: Diagnosis present

## 2020-07-28 DIAGNOSIS — N390 Urinary tract infection, site not specified: Principal | ICD-10-CM

## 2020-07-28 DIAGNOSIS — R410 Disorientation, unspecified: Secondary | ICD-10-CM | POA: Diagnosis present

## 2020-07-28 DIAGNOSIS — G20 Parkinson's disease: Secondary | ICD-10-CM | POA: Diagnosis present

## 2020-07-28 DIAGNOSIS — Z515 Encounter for palliative care: Secondary | ICD-10-CM | POA: Diagnosis not present

## 2020-07-28 DIAGNOSIS — K449 Diaphragmatic hernia without obstruction or gangrene: Secondary | ICD-10-CM | POA: Diagnosis present

## 2020-07-28 DIAGNOSIS — Z7189 Other specified counseling: Secondary | ICD-10-CM | POA: Diagnosis not present

## 2020-07-28 LAB — COMPREHENSIVE METABOLIC PANEL
ALT: 6 U/L (ref 0–44)
AST: 18 U/L (ref 15–41)
Albumin: 3.4 g/dL — ABNORMAL LOW (ref 3.5–5.0)
Alkaline Phosphatase: 52 U/L (ref 38–126)
Anion gap: 8 (ref 5–15)
BUN: 9 mg/dL (ref 8–23)
CO2: 23 mmol/L (ref 22–32)
Calcium: 8.9 mg/dL (ref 8.9–10.3)
Chloride: 109 mmol/L (ref 98–111)
Creatinine, Ser: 1.02 mg/dL — ABNORMAL HIGH (ref 0.44–1.00)
GFR, Estimated: 57 mL/min — ABNORMAL LOW (ref >60)
Glucose, Bld: 100 mg/dL — ABNORMAL HIGH (ref 70–99)
Potassium: 2.9 mmol/L — ABNORMAL LOW (ref 3.5–5.1)
Sodium: 140 mmol/L (ref 135–145)
Total Bilirubin: 0.9 mg/dL (ref 0.3–1.2)
Total Protein: 6.5 g/dL (ref 6.5–8.1)

## 2020-07-28 LAB — CBC
HCT: 36.1 % (ref 36.0–46.0)
Hemoglobin: 12.3 g/dL (ref 12.0–15.0)
MCH: 31.7 pg (ref 26.0–34.0)
MCHC: 34.1 g/dL (ref 30.0–36.0)
MCV: 93 fL (ref 80.0–100.0)
Platelets: 242 10*3/uL (ref 150–400)
RBC: 3.88 MIL/uL (ref 3.87–5.11)
RDW: 14.1 % (ref 11.5–15.5)
WBC: 5.9 10*3/uL (ref 4.0–10.5)
nRBC: 0 % (ref 0.0–0.2)

## 2020-07-28 LAB — POTASSIUM: Potassium: 3.6 mmol/L (ref 3.5–5.1)

## 2020-07-28 LAB — RPR: RPR Ser Ql: NONREACTIVE

## 2020-07-28 LAB — MAGNESIUM: Magnesium: 1.8 mg/dL (ref 1.7–2.4)

## 2020-07-28 MED ORDER — CARBIDOPA-LEVODOPA 25-100 MG PO TABS
1.0000 | ORAL_TABLET | Freq: Every day | ORAL | Status: DC
  Administered 2020-07-28 – 2020-07-30 (×9): 1 via ORAL
  Filled 2020-07-28 (×9): qty 1

## 2020-07-28 MED ORDER — POTASSIUM CHLORIDE 10 MEQ/100ML IV SOLN
10.0000 meq | INTRAVENOUS | Status: AC
  Administered 2020-07-28 (×4): 10 meq via INTRAVENOUS
  Filled 2020-07-28 (×4): qty 100

## 2020-07-28 NOTE — Plan of Care (Signed)
  Problem: Health Behavior/Discharge Planning: Goal: Ability to manage health-related needs will improve Outcome: Adequate for Discharge   

## 2020-07-28 NOTE — Evaluation (Signed)
Occupational Therapy Evaluation Patient Details Name: [name] MRN: [id number] DOB: [birth year] Today's Date: 07/28/2020    History of Present Illness Pt adm 4/14 with confusion. Encephalopathy likely infectious with UTI source.  PMH - dementia, HTN, Pulmonary HTN, DM2, CKD stage III, Parkinson's disease, substance abuse.   Clinical Impression   PTA, pt was living at home with her husband, pt required assistance with ADL/IADL and was independent with functional mobility in the home without AD and used a rollator for community mobility. Pt currently requires minA for stability with in room mobility without use of AD, she requires minguard for safety and stability while standing at sink level completing ADL. Pt with minimal verbalization this session. Pt reports double vision in left eye. Husband reports pt is at about 50% at baseline continuing to demonstrate limitations with mobility, cognition and decreased communication. Due to decline in current level of function, pt would benefit from acute OT to address established goals to facilitate safe D/C to venue listed below. At this time, recommend outpatient follow-up. Will continue to follow acutely.     Follow Up Recommendations  Outpatient OT ([facility])    Equipment Recommendations  None recommended by OT    Recommendations for Other Services       Precautions / Restrictions Precautions Precautions: Fall Precaution Comments: Pt complained of dizziness with sitting EOB and Standing. BP stable, HR showed signs of Vtach. Restrictions Weight Bearing Restrictions: No      Mobility Bed Mobility Overal bed mobility: Needs Assistance Bed Mobility: Supine to Sit;Sit to Supine     Supine to sit: Min guard;HOB elevated Sit to supine: Min assist   General bed mobility comments: increased time and effort to progress to EOB, cues for initiation and sequencing, with return to bed, pt required minA for BLE managment     Transfers Overall transfer level: Needs assistance Equipment used: None Transfers: Sit to/from Stand Sit to Stand: Min assist         General transfer comment: minA for stability and safety    Balance Overall balance assessment: Needs assistance Sitting-balance support: No upper extremity supported Sitting balance-Leahy Scale: Fair     Standing balance support: Single extremity supported;During functional activity Standing balance-Leahy Scale: Poor Standing balance comment: UE support for static standing while engaging in ADL                           ADL either performed or assessed with clinical judgement   ADL Overall ADL's : Needs assistance/impaired Eating/Feeding: Set up;Sitting   Grooming: Wash/dry hands;Wash/dry face;Min guard;Standing Grooming Details (indicate cue type and reason): completed oral care while standing at sink level, minA for thorough hair care due to knots Upper Body Bathing: Min guard;Cueing for safety;Cueing for sequencing;Sitting   Lower Body Bathing: Min guard;Cueing for safety;Cueing for sequencing;Sit to/from stand   Upper Body Dressing : Min guard;Sitting;Cueing for safety;Cueing for sequencing   Lower Body Dressing: Min guard;Sitting/lateral leans   Toilet Transfer: Minimal assistance;Ambulation Toilet Transfer Details (indicate cue type and reason): short distance without RW Toileting- Clothing Manipulation and Hygiene: Minimal assistance;Sit to/from stand   Tub/ Shower Transfer: Minimal assistance   Functional mobility during ADLs: Minimal assistance General ADL Comments: 1 person assistance to ambulate to sink from EOB (about 24feet) pt required minguard for stability while engaging in ADL.     Vision Baseline Vision/History: Wears glasses Wears Glasses: At all times Patient Visual Report: Diplopia (in left  eye) Vision Assessment?: Vision impaired- to be further tested in functional context;Yes Eye Alignment: Within  Functional Limits Ocular Range of Motion: Within Functional Limits Alignment/Gaze Preference: Within Defined Limits Tracking/Visual Pursuits: Requires cues, head turns, or add eye shifts to track;Unable to hold eye position out of midline Additional Comments: difficult to assess as pt easily distracted and unsure accuracty of report given, pt reports double vision with objects on top of eachother     Perception     Praxis      Pertinent Vitals/Pain Pain Assessment: No/denies pain Pain Intervention(s): Monitored during session     Hand Dominance Right   Extremity/Trunk Assessment Upper Extremity Assessment Upper Extremity Assessment: Generalized weakness;Overall WFL for tasks assessed RUE Coordination: decreased gross motor;decreased fine motor LUE Coordination: decreased gross motor;decreased fine motor   Lower Extremity Assessment Lower Extremity Assessment: Generalized weakness   Cervical / Trunk Assessment Cervical / Trunk Assessment: Kyphotic   Communication Communication Communication: No difficulties   Cognition Arousal/Alertness: Awake/alert Behavior During Therapy: Flat affect Overall Cognitive Status: Impaired/Different from baseline Area of Impairment: Orientation;Attention;Memory;Following commands;Safety/judgement;Awareness;Problem solving                 Orientation Level: Disoriented to;Place;Time;Situation Current Attention Level: Sustained Memory: Decreased short-term memory Following Commands: Follows one step commands consistently;Follows one step commands with increased time Safety/Judgement: Decreased awareness of safety;Decreased awareness of deficits Awareness: Intellectual Problem Solving: Slow processing;Decreased initiation;Requires verbal cues;Requires tactile cues General Comments: pt able to state name, month and day of birth, did not stated correct birth year. Pt oriented to place. Pt minimally conversant during session;following one  step commands;limited assessment secondary to pt with minimal verbalization   General Comments  VSS    Exercises     Shoulder Instructions      Home Living Family/patient expects to be discharged to:: Private residence Living Arrangements: Spouse/significant other Available Help at Discharge: Family;Available 24 hours/day Type of Home: House Home Access: Stairs to enter [employer] of Steps: 4 Entrance Stairs-Rails: Right;Left;Can reach both Home Layout: One level     Bathroom Shower/Tub: Teacher, early years/pre: Standard Bathroom Accessibility: Yes   Home Equipment: Environmental consultant - 4 wheels;Tub bench   Additional Comments: Aide 2x/wk for 4 hours      Prior Functioning/Environment Level of Independence: Needs assistance  Gait / Transfers Assistance Needed: Uses rollator for community mobility, does not use AD in home ADL's / Homemaking Assistance Needed: pt requries assistance for ADLs and IADL            OT Problem List: Decreased strength;Decreased activity tolerance;Impaired balance (sitting and/or standing)      OT Treatment/Interventions: Self-care/ADL training;Therapeutic exercise;Patient/family education;Balance training    OT Goals(Current goals can be found in the care plan section) Acute Rehab OT Goals Patient Stated Goal: not stated OT Goal Formulation: With patient/family Time For Goal Achievement: 08/11/20 Potential to Achieve Goals: Good ADL Goals Pt Will Perform Lower Body Dressing: (P) with modified independence;sit to/from stand Pt Will Transfer to Toilet: (P) with modified independence;ambulating Additional ADL Goal #1: (P) Pt will complete multistep cognition task wtih <3 errors for safe engagement in home environment.  OT Frequency: Min 2X/week   Barriers to D/C:            Co-evaluation              AM-PAC OT "6 Clicks" Daily Activity     Outcome Measure Help from another person eating meals?: None Help from  another person taking  care of personal grooming?: A Little Help from another person toileting, which includes using toliet, bedpan, or urinal?: A Little Help from another person bathing (including washing, rinsing, drying)?: A Little Help from another person to put on and taking off regular upper body clothing?: A Little Help from another person to put on and taking off regular lower body clothing?: A Little 6 Click Score: 19   End of Session Equipment Utilized During Treatment: Gait belt Nurse Communication: Mobility status  Activity Tolerance: Patient tolerated treatment well Patient left: in bed;with call bell/phone within reach;with bed alarm set;with family/visitor present  OT Visit Diagnosis: Unsteadiness on feet (R26.81);Repeated falls (R29.6);Muscle weakness (generalized) (M62.81)                Time: 2119-4174 OT Time Calculation (min): 27 min Charges:  OT General Charges $OT Visit: 1 Visit OT Evaluation $OT Eval Moderate Complexity: 1 Mod OT Treatments $Self Care/Home Management : 8-22 mins  Helene Kelp OTR/L Acute Rehabilitation Services Office: [place] 07/28/2020, 1:18 PM

## 2020-07-28 NOTE — Progress Notes (Signed)
Family Medicine Teaching Service Daily Progress Note Intern Pager: [phone]  [name] Medical record number: [id number] Date of birth: Sep 18, 1945 Age: 75 y.o. Gender: female  Primary Care Provider: [name], MD Consultants: none Code Status: full  Pt Overview and Major Events to Date:  4/14 admitted  Assessment and Plan: [name] a 76 y.o.femalepresenting with worsening confusion.Recently discharged 4/1, evaluated for new-onset A.fib with RVR and dysphagia s/p esophageal dilation. PMH is significant forhypertension, pulmonary hypertension,DM2,hyperlipidemia,CKD stage III, Parkinson's disease.  AMSlikely 2/2 UTI CT head w/o contrast negative for acute abnormality. discussed with neuro, no further interventions at this time. Pt continues to have waxing/waning confusion.  - follow up urine culture - mIVF with NS at 153mL/hr - ceftriaxone 1g IV dailyfor empiric UTI treatment - vitals per unit routine - delirium precautions - PT/OT eval and treat  Recent new-onset A. Fib with RVR  Pulmonary HTN Discharged 3/31 after admission for new-onset A. Fib with RVR on Eliquis 5 mg BID, metoprolol 75 mg BID. Admission EKG appears to be A. Flutter with bradycardia to 59 bpm.Atrial flutter resolved on most recent EKG.  -continuous cardiac monitoring -continue Eliquis -continue to hold home metoprolol due to bradyacardia -monitor EKG -vitals per unit routine  Elevated blood pressures  BP  186/95 this morning with multiple hypertensive episodes..  Previous values yesterday were normal.  -continue to monitor BP  Prolonged QTc  This morning was 560.   - sinemet held - recheck ekg at 2pm  Hypokalemia Potassium on admission 3.3, currently 2.9,  - IV potassium 24mEq x 4.   -2pm K check - monitor BMP  HxDysphgia  S/p esophageal dilation3/31 with GI while admitted.Last discharged 3/31 on zofran 4 mg q8 PRN, Sucralfate 1g qAC  &qHS. Not currently complaining of dysphagia or throat pain. Failed swallow study. - continue sucralfate - protonix 40 mg daily - if nauseous during admission, will determine best med at that time - NPO  Depression  anxiety  Parkinson's disease Home meds include zoloft 100 mg daily, carbidopa-levodopa 25-100 mg 5 times daily -zoloft 50 mg daily -carbidopa-levidopa, with plan to taper (holding while QTc elevated)  Chronic Knee Pain Home meds include gabapentin 300 mg tablets PRN for pain (2 capsules AM, 2 capsules lunch, 3 capsules PM) and tramadol 50 mg BID PRN.  - holding while altered  FEN/GI: NPO  PPx: Eliquis   Disposition: likely SNF  Subjective:  Pt is accompanied by husband.  Husband states he hasn't noticed much change in her mental status.  Husband states she had some delirium a year ago but this improved after 1-2 days.   Pt states she has no pain, no complaints.  She is able to say her name and that we are at [facility] but can't say the month or year.    Objective: Temp:  [97.2 F (36.2 C)-98.9 F (37.2 C)] 97.2 F (36.2 C) (04/15 2035) Pulse Rate:  [60-99] 64 (04/15 2035) Resp:  [16-19] 16 (04/15 2035) BP: (127-186)/(68-99) 186/95 (04/15 2035) SpO2:  [94 %-98 %] 94 % (04/15 2035) Physical Exam: General: alert, oriented to person and place.  Sitting in bed.  Cardiovascular: RRR. No murmurs.  Respiratory: lctab.  Abdomen: soft, nontender.  Extremities: strength equal bilaterally upper and lower extremities.  Psych: patient smiling. Delayed thought processing.  Unable to answer some questions, but otherwise answers appropriately.    Laboratory: Recent Labs  Lab 07/26/20 1733 07/27/20 0117 07/27/20 0500 07/28/20 0249  WBC 7.6  --  7.5 5.9  HGB 13.1 11.9* 12.6 12.3  HCT 38.8 35.0* 37.7 36.1  PLT 255  --  238 242   Recent Labs  Lab 07/26/20 1733 07/27/20 0117 07/27/20 0500  NA 138 142 139  K 3.3* 3.1* 3.3*  CL 104  --  104  CO2 23  --  22   BUN 9  --  12  CREATININE 1.03*  --  1.04*  CALCIUM 9.2  --  8.9  PROT 6.9  --  6.3*  BILITOT 1.7*  --  1.7*  ALKPHOS 60  --  59  ALT 21  --  19  AST 23  --  25  GLUCOSE 108*  --  86     Imaging/Diagnostic Tests:  Benay Pike, MD 07/28/2020, 3:42 AM PGY-3, [place] Intern pager: [phone], text pages welcome

## 2020-07-28 NOTE — Progress Notes (Signed)
Received page from RN regarding AM EKG with critical result of prolonged QTc of 560.   Called pharmacy to discuss. Will DC sinemet for now. AM potassium 2.9. No mag lab yet today.   - IV potassium 10 mEq x4 - 2pm K+ recheck - f/u Mag lab add-on to previous collection - 2pm EKG repeat  Ezequiel Essex, MD

## 2020-07-29 ENCOUNTER — Telehealth: Payer: Self-pay | Admitting: Physician Assistant

## 2020-07-29 ENCOUNTER — Encounter (HOSPITAL_COMMUNITY): Payer: Self-pay | Admitting: Family Medicine

## 2020-07-29 DIAGNOSIS — R410 Disorientation, unspecified: Secondary | ICD-10-CM

## 2020-07-29 DIAGNOSIS — N390 Urinary tract infection, site not specified: Secondary | ICD-10-CM | POA: Diagnosis not present

## 2020-07-29 LAB — URINE CULTURE: Culture: >=100000 — AB

## 2020-07-29 LAB — BASIC METABOLIC PANEL
Anion gap: 7 (ref 5–15)
BUN: 9 mg/dL (ref 8–23)
CO2: 27 mmol/L (ref 22–32)
Calcium: 9.3 mg/dL (ref 8.9–10.3)
Chloride: 108 mmol/L (ref 98–111)
Creatinine, Ser: 1.18 mg/dL — ABNORMAL HIGH (ref 0.44–1.00)
GFR, Estimated: 48 mL/min — ABNORMAL LOW (ref >60)
Glucose, Bld: 111 mg/dL — ABNORMAL HIGH (ref 70–99)
Potassium: 3.8 mmol/L (ref 3.5–5.1)
Sodium: 142 mmol/L (ref 135–145)

## 2020-07-29 MED ORDER — CEPHALEXIN 500 MG PO CAPS
500.0000 mg | ORAL_CAPSULE | Freq: Three times a day (TID) | ORAL | Status: DC
  Administered 2020-07-29 – 2020-07-30 (×2): 500 mg via ORAL
  Filled 2020-07-29 (×2): qty 1

## 2020-07-29 MED ORDER — METOPROLOL TARTRATE 50 MG PO TABS
50.0000 mg | ORAL_TABLET | Freq: Two times a day (BID) | ORAL | Status: DC
  Administered 2020-07-29 – 2020-07-30 (×3): 50 mg via ORAL
  Filled 2020-07-29 (×4): qty 1

## 2020-07-29 NOTE — Progress Notes (Signed)
Occupational Therapy Treatment Patient Details Name: [name] MRN: [id number] DOB: 1946-03-08 Today's Date: 07/29/2020    History of present illness Pt adm 4/14 with confusion. Encephalopathy likely infectious with UTI source.  PMH - dementia, HTN, Pulmonary HTN, DM2, CKD stage III, Parkinson's disease, substance abuse.   OT comments  Pt making gradual progress towards OT goals this session. Pt very lethargic this session following one step commands inconsistently and slightly resistant to movement however pt states that she wants to mobilize OOB. MOD A +1 to transition to EOB, pt completed seated UB ADLs with set- up assist but returns self to supine before pt able to stand. Will continue to follow acutely per POC and update DC plans as pt progresses.    Follow Up Recommendations  Outpatient OT;Other (comment) (OP Neuro for PD)    Equipment Recommendations  None recommended by OT    Recommendations for Other Services      Precautions / Restrictions Precautions Precautions: Fall Restrictions Weight Bearing Restrictions: No       Mobility Bed Mobility Overal bed mobility: Needs Assistance Bed Mobility: Supine to Sit;Sit to Supine     Supine to sit: Mod assist;HOB elevated Sit to supine: Supervision;HOB elevated   General bed mobility comments: pt resistant to sitting EOB needing MOD A to transition to sitting, most assist needed to elevate trunk however pt returning self to supine with supervision    Transfers                 General transfer comment: unable as pt returned self to supine prior to sit<>stand    Balance Overall balance assessment: Needs assistance Sitting-balance support: No upper extremity supported;Feet supported Sitting balance-Leahy Scale: Fair Sitting balance - Comments: sitting EOB for UB ADLs with no LOB ~ 3 mins                                   ADL either performed or assessed with clinical judgement   ADL  Overall ADL's : Needs assistance/impaired     Grooming: Wash/dry face;Sitting;Supervision/safety;Set up Grooming Details (indicate cue type and reason): sitting EOB to wash face         Upper Body Dressing : Minimal assistance;Sitting Upper Body Dressing Details (indicate cue type and reason): to don hospital gown as back side cover       Toilet Transfer Details (indicate cue type and reason): pt returned self to supine before COTA able to mobilize with pt         Functional mobility during ADLs: Moderate assistance (bed mobility only) General ADL Comments: pt very lethargic this session, needing MOD A for bed mobility, supervision - MIN A for UB ADLs before returning self to supine     Vision       Perception     Praxis      Cognition Arousal/Alertness: Lethargic (pt asleep upon arrival but agreeable to session however pt returns self to supine ~ 3 mins after sitting EOB) Behavior During Therapy: Flat affect Overall Cognitive Status: Difficult to assess                                 General Comments: difficult to assess cog secondary to decreased level of arousal, pt following commands inconsistently and self limiting at times        Exercises  Shoulder Instructions       General Comments VSS, pts husband present during session with no c/o dizziness this session    Pertinent Vitals/ Pain       Pain Assessment: Faces Faces Pain Scale: No hurt  Home Living                                          Prior Functioning/Environment              Frequency  Min 2X/week        Progress Toward Goals  OT Goals(current goals can now be found in the care plan section)  Progress towards OT goals: Progressing toward goals  Acute Rehab OT Goals Patient Stated Goal: husband  wants her to be able to return home to go to OP OT Goal Formulation: With family Time For Goal Achievement: 08/11/20 Potential to Achieve Goals:  Fair  Plan Frequency remains appropriate;Discharge plan remains appropriate    Co-evaluation                 AM-PAC OT "6 Clicks" Daily Activity     Outcome Measure   Help from another person eating meals?: None Help from another person taking care of personal grooming?: None Help from another person toileting, which includes using toliet, bedpan, or urinal?: A Lot Help from another person bathing (including washing, rinsing, drying)?: A Lot Help from another person to put on and taking off regular upper body clothing?: A Little Help from another person to put on and taking off regular lower body clothing?: A Lot 6 Click Score: 17    End of Session    OT Visit Diagnosis: Unsteadiness on feet (R26.81);Repeated falls (R29.6);Muscle weakness (generalized) (M62.81)   Activity Tolerance Patient limited by fatigue   Patient Left in bed;with call bell/phone within reach;with bed alarm set;with family/visitor present   Nurse Communication Mobility status        Time: 7001-7494 OT Time Calculation (min): 12 min  Charges: OT General Charges $OT Visit: 1 Visit OT Treatments $Self Care/Home Management : 8-22 mins  [name]., COTA/L Acute Rehabilitation Services [phone]    [name] 07/29/2020, 2:44 PM

## 2020-07-29 NOTE — Progress Notes (Signed)
FPTS Interim Progress Note  During patient's last hospitalization, she was admitted for new onset atrial fibrillation and instructed to have cardioversion performed outpatient. Given patient is currently admitted again, although for different indication, wanted to touch base with cardiology to ensure no further intervention while in the inpatient setting. Spoke to [place] [facility] who said that since patient is currently rate controlled we will plan for cardioversion outpatient as she will need to be on continuous anticoagulation for the procedure. Hinton Dyer will relay the message to ensure that patient gets scheduled for cardioversion outpatient soon after discharge. Appreciate assistance in this matter.     Donney Dice, DO 07/29/2020, 12:10 PM PGY-1, [place] Medicine Service pager [phone]

## 2020-07-29 NOTE — Progress Notes (Addendum)
Family Medicine Teaching Service Daily Progress Note Intern Pager: [phone]  [name] Medical record number: [id number] Date of birth: 05/14/1945 Age: 75 y.o. Gender: female  Primary Care Provider: [name], MD Consultants: none Code Status: Full  Pt Overview and Major Events to Date:  4/14: Admitted   Assessment and Plan: Mallory Gouger Edwardsis a 75 y.o.femalepresenting with worsening confusion.Recently discharged 4/1, evaluated for new-onset A.fib with RVR and dysphagia s/p esophageal dilation. PMH is significant forhypertension, pulmonary hypertension,DM2,hyperlipidemia,CKD stage III, Parkinson's disease.  AMSlikely 2/2 UTI CT head w/o contrast negative for acute abnormality. Prevsiously discussed with neurology, no further interventions at this time as it is likely due to delirium secondary to UTI. Urine culture returned with Klebsiella. Patient continues to wax and wane, may have to consider that with progressive Parkinson's, this may be a new baseline.  - mIVF with NS at 167m/hr - ceftriaxone 1g IV dailyfor empiric UTI treatment, consider transitioning to keflex  - vitals per unit routine - delirium precautions - PT/OT eval and treat  Recent new-onset A. Fib with RVR  Pulmonary HTN Discharged 3/31 after admission for new-onset A. Fib with RVR on Eliquis 5 mg BID, metoprolol 75 mg BID. Admission EKG appears to be A. Flutter with bradycardia to 59 bpm.Atrial flutter resolved on most recent EKG. -continuous cardiac monitoring -continue Eliquis -restarted home metoprolol  -monitor EKG -consider contacting cardiology given patient is supposed to have outpatient cardioversion  Elevated blood pressures  BP  187/91 this morning with multiple hypertensive episodes. -continue to monitor BP -restarted home metoprolol   Prolonged QTc  Most recently 560.   -monitor EKG  Hypokalemia Likely resolved. K 3.8.  -monitor  BMP  HxDysphgia  S/p esophageal dilation3/31 with GI while admitted.Upon most recent SLP evaluation, recommended regular diet.  - protonix 40 mg daily -regular diet   Depression  anxiety  Parkinson's disease Home meds include zoloft 100 mg daily, carbidopa-levodopa 25-100 mg 5 times daily -zoloft 50 mg daily -carbidopa-levidopa  Chronic Knee Pain Home meds include gabapentin 300 mg tablets PRN for pain (2 capsules AM, 2 capsules lunch, 3 capsules PM) and tramadol 50 mg BID PRN.  - holding while altered  Goals of care Discussed with husband that given progressive Parkinson's, patient and family would significantly benefit from goals of care discussion to ensure patient has all resources and needs are met when it is time to discharge home, he is in agreement. -palliative consulted, awaiting and appreciate recs   FEN/GI: regular after SLP evaluation  PPx: Eliquis    Status is: Inpatient  Remains inpatient appropriate because:Altered mental status   Dispo: The patient is from: Home              Anticipated d/c is to: Home              Patient currently is not medically stable to d/c.   Difficult to place patient No        Subjective:  No acute overnight events reported. Husband present at bedside who states that she has improved but patient is still not back to baseline.   Objective: Temp:  [97.5 F (36.4 C)-97.9 F (36.6 C)] 97.9 F (36.6 C) (04/16 2052) Pulse Rate:  [57-81] 81 (04/17 0510) Resp:  [16-20] 18 (04/17 0510) BP: (123-191)/(91-112) 187/91 (04/17 0510) SpO2:  [96 %-98 %] 98 % (04/17 0510) Weight:  [80 kg] 80 kg (04/17 0350) Physical Exam: General: Patient sleeping in bed comfortably.  Cardiovascular: RRR, no  murmurs or gallops auscultated  Respiratory: CTAB, breathing comfortably on room air  Abdomen: soft, nontender, BS+ Extremities: radial and distal pulses strong and equal bilaterally  Neuro: AOx1 although limited by patient cooperation,  alert to voice and follows some commands, responds appropriately when she responds  Psych: no agitation noted   Unable to perform full neurological exam given patient kept returning back to sleep.   Laboratory: Recent Labs  Lab 07/26/20 1733 07/27/20 0117 07/27/20 0500 07/28/20 0249  WBC 7.6  --  7.5 5.9  HGB 13.1 11.9* 12.6 12.3  HCT 38.8 35.0* 37.7 36.1  PLT 255  --  238 242   Recent Labs  Lab 07/26/20 1733 07/27/20 0117 07/27/20 0500 07/28/20 0249 07/28/20 1411 07/29/20 0441  NA 138   < > 139 140  --  142  K 3.3*   < > 3.3* 2.9* 3.6 3.8  CL 104  --  104 109  --  108  CO2 23  --  22 23  --  27  BUN 9  --  12 9  --  9  CREATININE 1.03*  --  1.04* 1.02*  --  1.18*  CALCIUM 9.2  --  8.9 8.9  --  9.3  PROT 6.9  --  6.3* 6.5  --   --   BILITOT 1.7*  --  1.7* 0.9  --   --   ALKPHOS 60  --  59 52  --   --   ALT 21  --  19 6  --   --   AST 23  --  25 18  --   --   GLUCOSE 108*  --  86 100*  --  111*   < > = values in this interval not displayed.      Imaging/Diagnostic Tests: No results found.  Donney Dice, DO 07/29/2020, 6:52 AM PGY-1, [place] Intern pager: [phone], text pages welcome

## 2020-07-29 NOTE — Telephone Encounter (Signed)
Received call from resident service - patient was recently in the hospital with afib. Dr. Radford Pax had recommended avoiding TEE, and proceeding with 3 weeks of anticoagulation with outpatient f/u to discuss potential outpatient DCCV. Appt was scheduled for tomorrow to discuss this (DCCV not yet scheduled). IM team was wondering how to proceed with these plans since she is now back in the hospital. She is admitted with altered mental status and UTI, undergoing treatment for such. She is rate controlled per their report. Reviewed with DOD Dr. Margaretann Loveless. We do not think it is wise to proceed with DCCV while she is acutely ill and altered. Would continue anticoagulation and continue to f/u as OP for these discussions. I cancelled her 4/18 OV appt for tomorrow and sent a message to the office to call her to reschedule. Marzell Allemand PA-C

## 2020-07-29 NOTE — Plan of Care (Signed)
  Problem: Clinical Measurements: Goal: Ability to maintain clinical measurements within normal limits will improve Outcome: Progressing   

## 2020-07-30 ENCOUNTER — Other Ambulatory Visit (HOSPITAL_COMMUNITY): Payer: Self-pay

## 2020-07-30 ENCOUNTER — Ambulatory Visit: Payer: Medicare PPO | Admitting: Cardiology

## 2020-07-30 DIAGNOSIS — R4182 Altered mental status, unspecified: Secondary | ICD-10-CM | POA: Diagnosis not present

## 2020-07-30 DIAGNOSIS — Z66 Do not resuscitate: Secondary | ICD-10-CM

## 2020-07-30 DIAGNOSIS — Z515 Encounter for palliative care: Secondary | ICD-10-CM

## 2020-07-30 DIAGNOSIS — Z7189 Other specified counseling: Secondary | ICD-10-CM

## 2020-07-30 DIAGNOSIS — N390 Urinary tract infection, site not specified: Secondary | ICD-10-CM | POA: Diagnosis not present

## 2020-07-30 LAB — BASIC METABOLIC PANEL
Anion gap: 7 (ref 5–15)
BUN: 9 mg/dL (ref 8–23)
CO2: 23 mmol/L (ref 22–32)
Calcium: 9.3 mg/dL (ref 8.9–10.3)
Chloride: 109 mmol/L (ref 98–111)
Creatinine, Ser: 1.2 mg/dL — ABNORMAL HIGH (ref 0.44–1.00)
GFR, Estimated: 47 mL/min — ABNORMAL LOW (ref >60)
Glucose, Bld: 125 mg/dL — ABNORMAL HIGH (ref 70–99)
Potassium: 3.1 mmol/L — ABNORMAL LOW (ref 3.5–5.1)
Sodium: 139 mmol/L (ref 135–145)

## 2020-07-30 LAB — CBC
HCT: 37.8 % (ref 36.0–46.0)
Hemoglobin: 12.7 g/dL (ref 12.0–15.0)
MCH: 31.5 pg (ref 26.0–34.0)
MCHC: 33.6 g/dL (ref 30.0–36.0)
MCV: 93.8 fL (ref 80.0–100.0)
Platelets: 267 10*3/uL (ref 150–400)
RBC: 4.03 MIL/uL (ref 3.87–5.11)
RDW: 14.1 % (ref 11.5–15.5)
WBC: 7.6 10*3/uL (ref 4.0–10.5)
nRBC: 0 % (ref 0.0–0.2)

## 2020-07-30 LAB — MAGNESIUM: Magnesium: 1.9 mg/dL (ref 1.7–2.4)

## 2020-07-30 MED ORDER — POTASSIUM CHLORIDE 20 MEQ PO PACK
40.0000 meq | PACK | Freq: Two times a day (BID) | ORAL | Status: DC
  Administered 2020-07-30: 40 meq via ORAL
  Filled 2020-07-30 (×2): qty 2

## 2020-07-30 MED ORDER — CEPHALEXIN 500 MG PO CAPS
500.0000 mg | ORAL_CAPSULE | Freq: Two times a day (BID) | ORAL | 0 refills | Status: AC
  Filled 2020-07-30: qty 10, 5d supply, fill #0

## 2020-07-30 MED ORDER — CEPHALEXIN 500 MG PO CAPS: 500.0000 mg | ORAL_CAPSULE | Freq: Two times a day (BID) | ORAL | Status: DC

## 2020-07-30 MED ORDER — METOPROLOL TARTRATE 50 MG PO TABS
50.0000 mg | ORAL_TABLET | Freq: Two times a day (BID) | ORAL | 0 refills | Status: DC
  Filled 2020-07-30: qty 60, 30d supply, fill #0

## 2020-07-30 MED ORDER — SERTRALINE HCL 50 MG PO TABS
50.0000 mg | ORAL_TABLET | Freq: Every day | ORAL | 0 refills | Status: DC
  Filled 2020-07-30: qty 30, 30d supply, fill #0

## 2020-07-30 NOTE — Discharge Instructions (Signed)
You were hospitalized at [facility] due to confusion.  We expect this is from urinary tract infection which improved after antibiotic treatment  We are so glad you are feeling better.  Be sure to follow-up with your regularly scheduled appointments.  Please also be sure to follow-up with our clinic on 4/27 at 1:45 pm.  Thank you for allowing [place] to be a part of your medical care.  Take care, [facility] family medicine team

## 2020-07-30 NOTE — Progress Notes (Signed)
DISCHARGE NOTE HOME [name] to be discharged Home per MD order. Discussed prescriptions and follow up appointments with the patient and family. Prescriptions given to patient; medication list explained in detail. Family verbalized understanding.  Skin clean, dry and intact without evidence of skin break down, no evidence of skin tears noted. IV catheter discontinued intact. Site without signs and symptoms of complications. Dressing and pressure applied. Pt denies pain at the site currently. No complaints noted.  Patient free of lines, drains, and wounds.   An After Visit Summary (AVS) was printed and given to the patient. Patient escorted via wheelchair, and discharged home via private auto.  Mikki Santee, RN

## 2020-07-30 NOTE — Consult Note (Signed)
Palliative Medicine Inpatient Consult Note  Reason for consult:  "Discuss goals of care given progressive Parkinson's"  HPI:  Per intake H&P --> Mallory Kerkman Edwardsis a 75 y.o.femalepresenting with worsening confusion.Recently discharged 4/1, evaluated for new-onset A.fib with RVR and dysphagia s/p esophageal dilation. PMH is significant forhypertension, pulmonary hypertension,DM2,hyperlipidemia,CKD stage III, Parkinson's disease.  Palliative care has been asked to get involved to discuss the progressive nature of Parkinson's disease and goals of care.  Clinical Assessment/Goals of Care:  *Please note that this is a verbal dictation therefore any spelling or grammatical errors are due to the "[place] [employer]" system interpretation.  I have reviewed medical records including EPIC notes, labs and imaging, received report from bedside RN, assessed the patient who is sitting at the side of the bed in no distress oriented to self.    I met with Patient's husband, [name] to further discuss diagnosis prognosis, GOC, EOL wishes, disposition and options.   I introduced Palliative Medicine as specialized medical care for people living with serious illness. It focuses on providing relief from the symptoms and stress of a serious illness. The goal is to improve quality of life for both the patient and the family.  [name] shared with me that he and his wife have lived in [place], [place] for the past 20 years.  They have been married for over 40 years.  They have 1 son together who is handicapped and lives with [name]'s daughter.  Sammie had 3 children prior to marrying [name] 2 of whom have died and 1 of whom is still living.  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  She enjoys making money and "spending money on gambling".  She used to enjoy going to the mountains.  She is not overtly religious.  Prior to admission to the hospital [name] was helping  [name] with all basic activities of daily living including eating for which he feeds her.  He shares that there is a care aide who comes in twice a week for 4 hours at a time though he expresses this is not enough.   [name] and I discussed the progressive nature of Parkinson's disease and how over time patients become so debilitated that they are unable to do anything for themselves.  We reviewed that Falen is likely in the later stages of the disease process as we are now seeing her having difficulty with swallowing.  I further went on to share that the difficulty with swallowing is not something that can be fixed and is neurological in nature.  We reviewed that over time even with artificial nutrition by a PEG tube patient still tend to aspirate on their own secretions and additional supplemental feeding via a PEG tube does not fix this.  [name] understood this.  A detailed discussion was had today regarding advanced directives -[name] and Breannah had never made advanced directives.    Concepts specific to code status, artifical feeding and hydration, continued IV antibiotics and rehospitalization was had. I introduced a MOST form we are able to complete this as below:  Cardiopulmonary Resuscitation: Do Not Attempt Resuscitation (DNR/No CPR)  Medical Interventions: Limited Additional Interventions: Use medical treatment, IV fluids and cardiac monitoring as indicated, DO NOT USE intubation or mechanical ventilation. May consider use of less invasive airway support such as BiPAP or CPAP. Also provide comfort measures. Transfer to the hospital if indicated. Avoid intensive care.   Antibiotics: Antibiotics if indicated  IV Fluids: IV fluids if  indicated  Feeding Tube: No feeding tube   We had a discussion about the potential burden cardiopulmonary resuscitation would cause to [name] as opposed to benefit.  We reviewed that with [name]'s underlying conditions which are incurable we would likely do  Mindie more harm than good if we put her through such events.  Her husband was understanding towards this and in agreement with DO NOT RESUSCITATE.  The difference between a aggressive medical intervention path  and a palliative comfort care path for this patient at this time was had.  We reviewed that at some point in time the disease burden will be overwhelming for [name] at which point pursuit of hospice would not be unreasonable.  I described hospice as a service for patients for have a life expectancy of < 6 months. It preserves dignity and quality at the end phases of life. The focus changes from curative to symptom relief.  In the meanwhile I recommended to [name] that we have outpatient palliative support to continue following along and identifying [name]'s process of improvement or further declines.  Discussed the importance of continued conversation with family and their  medical providers regarding overall plan of care and treatment options, ensuring decisions are within the context of the patients values and GOCs.  Provided "Hard Choices for [employer]" booklet.   Decision Maker: [name] (spouse) [phone]  SUMMARY OF RECOMMENDATIONS   DNAR/DNI  MOST Completed, paper copy placed onto the chart electric copy can be found in [facility]  DNR Form Completed, paper copy placed onto the chart electric copy can be found in [facility]  TOC - OP Palliative support  Social Work - Aid in identifying resources for more help  Ongoing incremental palliative care support  Code Status/Advance Care Planning: DNAR/DNI   Palliative Prophylaxis:   Oral care, mobility  Additional Recommendations (Limitations, Scope, Preferences):  Continue to treat what is treatable  Psycho-social/Spiritual:   Desire for further Chaplaincy support:  No  Additional Recommendations:  Education on Parkinson's disease   Prognosis: Hoehn and Yahr scale Stg 4- severe disability has a high risk for  acute decline and ongoing falls/aspiration PNA. Has a high 12 month mortality risk when account for other co-morbidities in conjunction with PD.   Discharge Planning: Likely DC home with [facility] and OP Palliative support  Vitals:   07/29/20 1813 07/29/20 2050  BP: 140/76 (!) 158/99  Pulse: (!) 56 64  Resp: 20 20  Temp: 98.5 F (36.9 C) 98.4 F (36.9 C)  SpO2: 95% 94%    Intake/Output Summary (Last 24 hours) at 07/30/2020 0857 Last data filed at 07/29/2020 2200 Gross per 24 hour  Intake 717 ml  Output 200 ml  Net 517 ml   Last Weight  Most recent update: 07/29/2020  3:50 AM   Weight  80 kg (176 lb 5.9 oz)           Gen:  Elderly F in NAD HEENT: moist mucous membranes CV: Regular rate and irregular rhythm  PULM: ON RA ABD: soft/nontender  EXT: No edema  Neuro: Alert and oriented to self  PPS: 50%   This conversation/these recommendations were discussed with patient primary care team, Dr. Larae Grooms  Time In: 0930 Time Out: 1040 Total Time: 70 Greater than 50%  of this time was spent counseling and coordinating care related to the above assessment and plan.  [facility] Palliative Medicine Team Team Cell Phone: [phone] Please utilize secure chat with additional questions, if there is no  response within 30 minutes please call the above phone number  Palliative Medicine Team providers are available by phone from 7am to 7pm daily and can be reached through the team cell phone.  Should this patient require assistance outside of these hours, please call the patient's attending physician.

## 2020-07-30 NOTE — Progress Notes (Signed)
Initial Nutrition Assessment  DOCUMENTATION CODES:   Not applicable  INTERVENTION:  Ensure Enlive po BID, each supplement provides 350 kcal and 20 grams of protein  Magic cup BID with meals, each supplement provides 290 kcal and 9 grams of protein  MVI with minerals daily  NUTRITION DIAGNOSIS:   Inadequate oral intake related to chronic illness (parkinson's/dementia) as evidenced by meal completion < 50%.  GOAL:   Patient will meet greater than or equal to 90% of their needs   MONITOR:   PO intake,Supplement acceptance,Labs,I & O's  REASON FOR ASSESSMENT:   Malnutrition Screening Tool    ASSESSMENT:   Pt admitted with AMS likely 2/2 UTI. PMH includes HTN, type 2 DM, HLD, CKD stage III, parkinson's disease, ulcerated esophagitis, esophageal stricture, substance use, dementia, CHF.  Neurology was following pt but found no further intervention warranted and noted AMS is likely delirium related to the UTI. Pt continues to have waxing and wanning mental status which MD notes may be pt's new baseline given h/o Parkinson's. PMT consult is pending.   PO intake: 25-75% x 5 recorded meals (55% average meal intake)  Reviewed weight history. No significant weight changes noted.   UOP: 268ml documented x24 hours  Medications: miralax Labs: K+ 3.1 (L)  Diet Order:   Diet Order            Diet regular Room service appropriate? No; Fluid consistency: Thin  Diet effective now                 EDUCATION NEEDS:   No education needs have been identified at this time  Skin:  Skin Assessment: Reviewed RN Assessment  Last BM:  4/17 type 6  Height:   Ht Readings from Last 1 Encounters:  07/29/20 5\' 4"  (1.626 m)    Weight:   Wt Readings from Last 1 Encounters:  07/29/20 80 kg   BMI:  Body mass index is 30.27 kg/m.  Estimated Nutritional Needs:   Kcal:  1800-2000  Protein:  90-100 grams  Fluid:  >1.8L    Larkin Ina, MS, RD, LDN RD pager number and  weekend/on-call pager number located in [place].

## 2020-07-30 NOTE — Progress Notes (Signed)
Family Medicine Teaching Service Daily Progress Note Intern Pager: [phone]  [name] Medical record number: [id number] Date of birth: 06-13-1945 Age: 75 y.o. Gender: female  Primary Care Provider: [name], MD Consultants: none Code Status: Full  Pt Overview and Major Events to Date:  4/14: Admitted   Assessment and Plan: Mallory Christiansen Edwardsis a 75 y.o.femalepresenting with worsening confusion.Recently discharged 4/1, evaluated for new-onset A.fib with RVR and dysphagia s/p esophageal dilation. PMH is significant forhypertension, pulmonary hypertension,DM2,hyperlipidemia,CKD stage III, Parkinson's disease.  AMSlikely 2/2 UTI CT head w/o contrast negative for acute abnormality.Prevsiously discussed with neurology, no further interventions at this time as it is likely due to delirium secondary to UTI. Urine culture returned with Klebsiella. Patient continues to wax and wane, may have to consider that with progressive Parkinson's, this may be a new baseline.  - keflex 500 mg tid (4/17-) for 6 days  - vitals per unit routine - delirium precautions - OT recommends outpatient OT - awaiting PT eval and treat  Recent new-onset A. Fib with RVR  Pulmonary HTN Discharged 3/31 after admission for new-onset A. Fib with RVR on Eliquis 5 mg BID, metoprolol 75 mg BID. Admission EKG appears to be A. Flutter with bradycardia to 59 bpm.Atrial flutter resolved on most recent EKG. -continuous cardiac monitoring -continue Eliquis -metoprolol 50 mg bid  -monitor EKG -cardioversion outpatient per cardiology   Elevated blood pressures  BP158/99 this morning with multiple hypertensive episodes. -continue to monitor BP -metoprolol 50 mg bid    Prolonged QTc  Most recently 560.  -monitor EKG  Hypokalemia K 3.1 this morning. -appropriate repletion ordered  -monitor BMP -pending Mg  HxDysphgia  S/p esophageal dilation3/31 with GI while  admitted.Upon most recent SLP evaluation, recommended regular diet.  -protonix 40 mg daily -regular diet   Depression  anxiety  Parkinson's disease Home meds include zoloft 100 mg daily, carbidopa-levodopa 25-100 mg 5 times daily -zoloft 50 mg daily -carbidopa-levidopa  Chronic Knee Pain Home meds include gabapentin 300 mg tablets PRN for pain (2 capsules AM, 2 capsules lunch, 3 capsules PM) and tramadol 50 mg BID PRN.  -holding while altered  Goals of care Previously discussed with husband that given progressive Parkinson's, patient and family would significantly benefit from goals of care discussion to ensure patient has all resources and needs are met when it is time to discharge home, he is in agreement. -palliative consulted, awaiting and appreciate recs   FEN/GI: regular diet  PPx: Eliquis    Status is: Inpatient   Remains inpatient appropriate because:Altered mental status   Dispo: The patient is from: Home              Anticipated d/c is to: Home              Patient currently is not medically stable to d/c.   Difficult to place patient No        Subjective:  No overnight events reported. Husband present at bedside who shares that he believes she is getting close to back to her baseline as she was last night when their granddaughter came to visit.   Objective: Temp:  [98.4 F (36.9 C)-98.7 F (37.1 C)] 98.4 F (36.9 C) (04/17 2050) Pulse Rate:  [56-84] 64 (04/17 2050) Resp:  [17-20] 20 (04/17 2050) BP: (140-177)/(72-99) 158/99 (04/17 2050) SpO2:  [94 %-98 %] 94 % (04/17 2050) Physical Exam: General: Patient sleeping in bed comfortably, in no acute distress. Cardiovascular: RRR, no murmurs or  gallops auscultated  Respiratory: CTAB, breathing comfortably on room air Abdomen: soft, nontender, BS+ Extremities: radial and distal pulses strong and equal bilaterally, no LE edema noted bilaterally  Neuro: able to answer yes or no to questions but goes  back to sleep immediately Psych: no agitation noted   Neurological exam limited by patient wanting to go back to sleep.   Laboratory: Recent Labs  Lab 07/27/20 0500 07/28/20 0249 07/30/20 0256  WBC 7.5 5.9 7.6  HGB 12.6 12.3 12.7  HCT 37.7 36.1 37.8  PLT 238 242 267   Recent Labs  Lab 07/26/20 1733 07/27/20 0117 07/27/20 0500 07/28/20 0249 07/28/20 1411 07/29/20 0441 07/30/20 0256  NA 138   < > 139 140  --  142 139  K 3.3*   < > 3.3* 2.9* 3.6 3.8 3.1*  CL 104  --  104 109  --  108 109  CO2 23  --  22 23  --  27 23  BUN 9  --  12 9  --  9 9  CREATININE 1.03*  --  1.04* 1.02*  --  1.18* 1.20*  CALCIUM 9.2  --  8.9 8.9  --  9.3 9.3  PROT 6.9  --  6.3* 6.5  --   --   --   BILITOT 1.7*  --  1.7* 0.9  --   --   --   ALKPHOS 60  --  59 52  --   --   --   ALT 21  --  19 6  --   --   --   AST 23  --  25 18  --   --   --   GLUCOSE 108*  --  86 100*  --  111* 125*   < > = values in this interval not displayed.      Imaging/Diagnostic Tests: No results found.  Donney Dice, DO 07/30/2020, 6:57 AM PGY-1, [place] Intern pager: [phone], text pages welcome

## 2020-07-30 NOTE — Hospital Course (Addendum)
Altered mental status UTI Hypokalemia Parkinson's disease Atrial fibrillation  Hypertension    [name] is a 75 y.o. female presenting with worsening confusion. Recently discharged 4/1, evaluated for new-onset A.fib with RVR and dysphagia s/p esophageal dilation. PMH is significant for hypertension, pulmonary hypertension, DM2, hyperlipidemia, CKD stage III, Parkinson's disease.   Altered mental status  UTI Presented with altered mental status on admission with stable vital signs. Associated symptoms also included urinary incontinence and increased urinary frequency. UA notable for presence of nitrites, moderate Hgb, ketones 20, proteinuria of 30 and many bacteria. CT head w/o contrast notable for no acute abnormality. Imaging yielded low concern for stroke given no focal findings on exam. Altered mental status deemed to be secondary to delirium due to UTI in the setting of existing progressive Parkinson's disease. Neurology initially consulted given complexity of underlying neurological history but no further neurology evaluation necessary given UTI was the likely cause of acute changes. Patient initially placed on IV ceftriaxone 1 g. Urine cultures notable for Klebsiella. Patient transitioned to oral antibiotics, Keflex 500 mg bid for 6 days starting 4/17. Patient demonstrated significant improvement well prior to discharge with vitals signs that remained stable.   Hypokalemia K 3.3 on admission, given appropriate repletion. On day of discharge, patient K 3.1 with appropriate supplementation provided. Hospital follow up scheduled for repeat BMP check shortly after discharge.   All other issues chronic and stable   Issues for follow up:   Recommend BMP to monitor renal function as creatinine elevated during hospitalization.  Metoprolol was decreased from 75mg  BID to 50mg  BID. Please titrate as appropriate.  Zoloft was decreased to 50mg  daily from 100mg  daily.  Discharged with  home PT/OT, ensure that patient is receiving these services.  Home med zofran continued on discharge, monitor QTc prolongation  Palliative consulted regarding goals of care, recommended palliative outpatient. Please ensure patient follows up in the outpatient setting. Please follow up with neurology outpatient regarding progressive mental status decline in the setting of Parkinson's. Also to ensure patient on appropriate carbidopa-levodopa dose. Ensure patient follows up with cardiology outpatient for loop recorder. Try to limit QTc prolonging medications.

## 2020-07-30 NOTE — Discharge Summary (Addendum)
[facility] Discharge Summary  [name] Medical record number: [id number] Date of birth: 19-Mar-1946 Age: 75 y.o. Gender: female Date of Admission: 07/26/2020  Date of Discharge: 07/30/2020 Admitting Physician: Ezequiel Essex, MD  Primary Care Provider: [name], MD Consultants: none  Indication for Hospitalization: altered mental status   Discharge Diagnoses/Problem List:  Altered mental status secondary to UTI Hypokalemia Parkinson's disease Atrial fibrillation  Hypertension   Disposition: home  Discharge Condition: medically stable   Discharge Exam:  Temp:  [98.4 F (36.9 C)-98.7 F (37.1 C)] 98.4 F (36.9 C) (04/17 2050) Pulse Rate:  [56-84] 64 (04/17 2050) Resp:  [17-20] 20 (04/17 2050) BP: (140-177)/(72-99) 158/99 (04/17 2050) SpO2:  [94 %-98 %] 94 % (04/17 2050) Physical Exam: General: Patient sleeping in bed comfortably, in no acute distress. Cardiovascular: RRR, no murmurs or gallops auscultated  Respiratory: CTAB, breathing comfortably on room air Abdomen: soft, nontender, BS+ Extremities: radial and distal pulses strong and equal bilaterally, no LE edema noted bilaterally  Neuro: alert to voice, follows most commands, able to answer yes or no to questions, able to sit upright in bed without support Psych: no agitation noted   Brief Hospital Course:  [name] is a 75 y.o. female presenting with worsening confusion. Recently discharged 4/1, evaluated for new-onset A.fib with RVR and dysphagia s/p esophageal dilation. PMH is significant for hypertension, pulmonary hypertension, DM2, hyperlipidemia, CKD stage III, Parkinson's disease.   Altered mental status  UTI Presented with altered mental status on admission with stable vital signs. Associated symptoms also included urinary incontinence and increased urinary frequency. UA notable for presence of nitrites, moderate Hgb, ketones 20,  proteinuria of 30 and many bacteria. CT head w/o contrast notable for no acute abnormality. Imaging yielded low concern for stroke given no focal findings on exam. Altered mental status deemed to be secondary to delirium due to UTI in the setting of existing progressive Parkinson's disease. Neurology initially consulted given complexity of underlying neurological history but no further neurology evaluation necessary given UTI was the likely cause of acute changes. Patient initially placed on IV ceftriaxone 1 g. Urine cultures notable for Klebsiella. Patient transitioned to oral antibiotics, Keflex 500 mg bid for 6 days starting 4/17. Patient demonstrated significant improvement well prior to discharge with vitals signs that remained stable.   Hypokalemia K 3.3 on admission, given appropriate repletion. On day of discharge, patient K 3.1 with appropriate supplementation provided. Hospital follow up scheduled for repeat BMP check shortly after discharge.   All other issues chronic and stable    Issues for follow up:   1. Recommend BMP to monitor renal function as creatinine elevated during hospitalization.  2. Metoprolol was decreased from 75mg  BID to 50mg  BID. Please titrate as appropriate.  3. Zoloft was decreased to 50mg  daily from 100mg  daily.  4. Discharged with home PT/OT, ensure that patient is receiving these services.  5. Home med zofran continued on discharge, monitor QTc prolongation  6. Palliative consulted regarding goals of care, recommended palliative outpatient. Please ensure patient follows up in the outpatient setting. 7. Please follow up with neurology outpatient regarding progressive mental status decline in the setting of Parkinson's. Also to ensure patient on appropriate carbidopa-levodopa dose. 8. Ensure patient follows up with cardiology outpatient for loop recorder. 9. Try to limit QTc prolonging medications.   Significant Procedures:  none  Significant Labs and  Imaging:  Recent Labs  Lab 07/27/20 0500 07/28/20 0249 07/30/20 0256  WBC 7.5 5.9 7.6  HGB 12.6 12.3 12.7  HCT 37.7 36.1 37.8  PLT 238 242 267   Recent Labs  Lab 07/26/20 1733 07/27/20 0117 07/27/20 0500 07/28/20 0249 07/28/20 1411 07/29/20 0441 07/30/20 0256 07/30/20 1023  NA 138 142 139 140  --  142 139  --   K 3.3* 3.1* 3.3* 2.9*   < > 3.8 3.1*  --   CL 104  --  104 109  --  108 109  --   CO2 23  --  22 23  --  27 23  --   GLUCOSE 108*  --  86 100*  --  111* 125*  --   BUN 9  --  12 9  --  9 9  --   CREATININE 1.03*  --  1.04* 1.02*  --  1.18* 1.20*  --   CALCIUM 9.2  --  8.9 8.9  --  9.3 9.3  --   MG  --   --   --  1.8  --   --   --  1.9  ALKPHOS 60  --  59 52  --   --   --   --   AST 23  --  25 18  --   --   --   --   ALT 21  --  19 6  --   --   --   --   ALBUMIN 3.7  --  3.5 3.4*  --   --   --   --    < > = values in this interval not displayed.      Results/Tests Pending at Time of Discharge: none  Discharge Medications:  Allergies as of 07/30/2020      Reactions   Ace Inhibitors Anaphylaxis, Swelling   Azilect [rasagiline Mesylate] Other (See Comments)   hypotension   Lisinopril Swelling   Severe facial angioedema requiring hospitalization 2007 (approx)      Medication List    STOP taking these medications   nystatin-triamcinolone ointment Commonly known as: MYCOLOG   sucralfate 1 GM/10ML suspension Commonly known as: CARAFATE     TAKE these medications   acetaminophen 325 MG tablet Commonly known as: TYLENOL Take 2 tablets (650 mg total) by mouth every 4 (four) hours as needed for headache or mild pain.   apixaban 5 MG Tabs tablet Commonly known as: ELIQUIS Take 1 tablet (5 mg total) by mouth 2 (two) times daily.   carbidopa-levodopa 25-100 MG tablet Commonly known as: SINEMET IR Take 1 tablet by mouth 5 (five) times daily.   cephALEXin 500 MG capsule Commonly known as: KEFLEX Take 1 capsule (500 mg total) by mouth every 12 (twelve)  hours for 5 days.   gabapentin 300 MG capsule Commonly known as: NEURONTIN Take 300-600 mg by mouth See admin instructions. Take  2 capsules in the morning and 2 capsules at lunch 3 capsule in the evening as needed for pain   loperamide 2 MG tablet Commonly known as: IMODIUM A-D Take 2 mg by mouth 4 (four) times daily as needed for diarrhea or loose stools.   metoprolol tartrate 50 MG tablet Commonly known as: LOPRESSOR Take 1 tablet (50 mg total) by mouth 2 (two) times daily. What changed:   medication strength  how much to take   omeprazole 40 MG capsule Commonly known as: PRILOSEC TAKE 1 CAPSULE BY MOUTH TWICE DAILY What changed:   how much to take  how to  take this  when to take this  additional instructions   ondansetron 4 MG tablet Commonly known as: ZOFRAN Take 1 tablet (4 mg total) by mouth every 8 (eight) hours as needed for nausea or vomiting.   sertraline 50 MG tablet Commonly known as: ZOLOFT Take 1 tablet (50 mg total) by mouth daily. Start taking on: July 31, 2020 What changed:   medication strength  how much to take  additional instructions   traMADol 50 MG tablet Commonly known as: ULTRAM TAKE 1 TO 2 TABLETS BY MOUTH TWICE DAILY AS NEEDED FOR CHRONIC KNEE PAIN What changed:   how much to take  how to take this  when to take this  reasons to take this  additional instructions       Discharge Instructions: Please refer to Patient Instructions section of EMR for full details.  Patient was counseled important signs and symptoms that should prompt return to medical care, changes in medications, dietary instructions, activity restrictions, and follow up appointments.   Follow-Up Appointments:  Follow-up Information    Sueanne Margarita, MD Follow up.   Specialty: Cardiology Why: We cancelled your 4/18 appointment with Dr. Radford Pax (cardiology) since you were in the hospital. The office will call you to reschedule your follow-up  visit. Contact information: [address] [place] [address] [phone]        [name], MD Follow up.   Specialties: Family Medicine, Sports Medicine Contact information: [address] [place] [address] [place]. Go on 08/08/2020.   Specialty: Family Medicine Why: Please go to your appointment on 4/27 at 1:45 pm, please arrive 15 minutes early for your appointment.  Contact information: [address] [id number] [place], [facility] Follow up.   Specialty: Home Health Services Why: The home health agency will contact you for the first home visit. Contact information: [place] [address] [phone]               Donney Dice, DO 07/30/2020, 7:30 PM PGY-1, [place]

## 2020-07-30 NOTE — TOC Transition Note (Addendum)
Transition of Care [facility]) - CM/SW Discharge Note   Patient Details  Name: [name] MRN: [id number] Date of Birth: Apr 24, 1945  [facility] CM/SW Contact:  Pollie Friar, RN Phone Number: 07/30/2020, 11:31 AM   Clinical Narrative:    Patient discharging home with outpatient therapy. Pt has information on the AVS about the outpatient therapy.  Pt has transportation home today.  1520: Family has decided on home health instead of outpatient therapy. CM called [facility] and cancelled the outpatient therapy for now. HH arranged through [facility]. Information on the AVS.   Final next level of care: OP Rehab Barriers to Discharge: No Barriers Identified   Patient Goals and CMS Choice        Discharge Placement                       Discharge Plan and Services                                     Social Determinants of Health (SDOH) Interventions     Readmission Risk Interventions No flowsheet data found.

## 2020-07-30 NOTE — Progress Notes (Signed)
Physical Therapy Treatment Patient Details Name: [name] MRN: [id number] DOB: Sep 01, 1945 Today's Date: 07/30/2020    History of Present Illness Pt adm 4/14 with confusion. Encephalopathy likely infectious with UTI source.  PMH - dementia, HTN, Pulmonary HTN, DM2, CKD stage III, Parkinson's disease, substance abuse.    PT Comments    Continuing work on functional mobility and activity tolerance;  Session focused on progressive amb and looking at gait in prep for dc home; Needed lots of encouragement to participate, but did walk once her daughter arrived; [place] faster with handheld assist; Discussed dc plan, and family requesting HH PT follow up; Notified TOC   Follow Up Recommendations  Home health PT;Supervision - Intermittent;Other (comment) (followed by Outpt PT with [facility]'s [facility])     Equipment Recommendations  None recommended by PT    Recommendations for Other Services       Precautions / Restrictions Precautions Precautions: Fall Precaution Comments: Pt complained of dizziness with sitting EOB and Standing. BP stable    Mobility  Bed Mobility Overal bed mobility: Needs Assistance Bed Mobility: Supine to Sit     Supine to sit: Mod assist;HOB elevated     General bed mobility comments: Light mod assist to get up to EOB; lots of encouragement    Transfers Overall transfer level: Needs assistance Equipment used: 1 person hand held assist Transfers: Sit to/from Stand Sit to Stand: Min assist         General transfer comment: min, leading assist to initiate sit to stnad and steady  Ambulation/Gait Ambulation/Gait assistance: Min guard Gait Distance (Feet): 120 Feet (60 with  RW, 60 with handheld assist) Assistive device: Rolling walker (2 wheeled);1 person hand held assist Gait Pattern/deviations: Step-through pattern;Decreased stride length     General Gait Details: Assist for safety and cues to pay attention to obstacles; slower moving  with RW   Stairs             Wheelchair Mobility    Modified Rankin (Stroke Patients Only)       Balance     Sitting balance-Leahy Scale: Fair       Standing balance-Leahy Scale: Fair                              Cognition Arousal/Alertness: Awake/alert Behavior During Therapy: Flat affect Overall Cognitive Status: Difficult to assess Area of Impairment: Orientation;Attention;Memory;Following commands;Safety/judgement;Awareness;Problem solving                                      Exercises      General Comments General comments (skin integrity, edema, etc.): VSS; lots of encouragemtn to participate      Pertinent Vitals/Pain Pain Assessment: No/denies pain    Home Living                      Prior Function            PT Goals (current goals can now be found in the care plan section) Acute Rehab PT Goals Patient Stated Goal: husband  wants her to be able to return home to go to OP PT Goal Formulation: Patient unable to participate in goal setting Time For Goal Achievement: 08/10/20 Potential to Achieve Goals: Good Progress towards PT goals: Progressing toward goals    Frequency    Min 3X/week  PT Plan Discharge plan needs to be updated    Co-evaluation              AM-PAC PT "6 Clicks" Mobility   Outcome Measure  Help needed turning from your back to your side while in a flat bed without using bedrails?: A Little Help needed moving from lying on your back to sitting on the side of a flat bed without using bedrails?: A Little Help needed moving to and from a bed to a chair (including a wheelchair)?: A Little Help needed standing up from a chair using your arms (e.g., wheelchair or bedside chair)?: A Little Help needed to walk in hospital room?: A Little Help needed climbing 3-5 steps with a railing? : A Little 6 Click Score: 18    End of Session Equipment Utilized During Treatment: Gait  belt Activity Tolerance: Patient tolerated treatment well Patient left: in bed;with call bell/phone within reach (sitting EOB with daughter and husband present) Nurse Communication: Mobility status PT Visit Diagnosis: Other abnormalities of gait and mobility (R26.89);Muscle weakness (generalized) (M62.81)     Time: 1438-1500 PT Time Calculation (min) (ACUTE ONLY): 22 min  Charges:  $Gait Training: 8-22 mins                     Roney Marion, [place]  Acute Rehabilitation Services Pager [phone] Office [phone]    Colletta [place] 07/30/2020, 4:34 PM

## 2020-08-02 DIAGNOSIS — I4892 Unspecified atrial flutter: Secondary | ICD-10-CM | POA: Diagnosis not present

## 2020-08-02 DIAGNOSIS — I130 Hypertensive heart and chronic kidney disease with heart failure and stage 1 through stage 4 chronic kidney disease, or unspecified chronic kidney disease: Secondary | ICD-10-CM | POA: Diagnosis not present

## 2020-08-02 DIAGNOSIS — E1122 Type 2 diabetes mellitus with diabetic chronic kidney disease: Secondary | ICD-10-CM | POA: Diagnosis not present

## 2020-08-02 DIAGNOSIS — I509 Heart failure, unspecified: Secondary | ICD-10-CM | POA: Diagnosis not present

## 2020-08-02 DIAGNOSIS — F0280 Dementia in other diseases classified elsewhere without behavioral disturbance: Secondary | ICD-10-CM | POA: Diagnosis not present

## 2020-08-02 DIAGNOSIS — N183 Chronic kidney disease, stage 3 unspecified: Secondary | ICD-10-CM | POA: Diagnosis not present

## 2020-08-02 DIAGNOSIS — G3183 Dementia with Lewy bodies: Secondary | ICD-10-CM | POA: Diagnosis not present

## 2020-08-02 DIAGNOSIS — N390 Urinary tract infection, site not specified: Secondary | ICD-10-CM | POA: Diagnosis not present

## 2020-08-02 DIAGNOSIS — I4891 Unspecified atrial fibrillation: Secondary | ICD-10-CM | POA: Diagnosis not present

## 2020-08-03 DIAGNOSIS — I130 Hypertensive heart and chronic kidney disease with heart failure and stage 1 through stage 4 chronic kidney disease, or unspecified chronic kidney disease: Secondary | ICD-10-CM | POA: Diagnosis not present

## 2020-08-03 DIAGNOSIS — I509 Heart failure, unspecified: Secondary | ICD-10-CM | POA: Diagnosis not present

## 2020-08-03 DIAGNOSIS — G3183 Dementia with Lewy bodies: Secondary | ICD-10-CM | POA: Diagnosis not present

## 2020-08-03 DIAGNOSIS — I4891 Unspecified atrial fibrillation: Secondary | ICD-10-CM | POA: Diagnosis not present

## 2020-08-03 DIAGNOSIS — F0280 Dementia in other diseases classified elsewhere without behavioral disturbance: Secondary | ICD-10-CM | POA: Diagnosis not present

## 2020-08-03 DIAGNOSIS — E1122 Type 2 diabetes mellitus with diabetic chronic kidney disease: Secondary | ICD-10-CM | POA: Diagnosis not present

## 2020-08-03 DIAGNOSIS — I4892 Unspecified atrial flutter: Secondary | ICD-10-CM | POA: Diagnosis not present

## 2020-08-03 DIAGNOSIS — N183 Chronic kidney disease, stage 3 unspecified: Secondary | ICD-10-CM | POA: Diagnosis not present

## 2020-08-03 DIAGNOSIS — N390 Urinary tract infection, site not specified: Secondary | ICD-10-CM | POA: Diagnosis not present

## 2020-08-06 DIAGNOSIS — I509 Heart failure, unspecified: Secondary | ICD-10-CM | POA: Diagnosis not present

## 2020-08-06 DIAGNOSIS — N390 Urinary tract infection, site not specified: Secondary | ICD-10-CM | POA: Diagnosis not present

## 2020-08-06 DIAGNOSIS — N183 Chronic kidney disease, stage 3 unspecified: Secondary | ICD-10-CM | POA: Diagnosis not present

## 2020-08-06 DIAGNOSIS — F0280 Dementia in other diseases classified elsewhere without behavioral disturbance: Secondary | ICD-10-CM | POA: Diagnosis not present

## 2020-08-06 DIAGNOSIS — G3183 Dementia with Lewy bodies: Secondary | ICD-10-CM | POA: Diagnosis not present

## 2020-08-06 DIAGNOSIS — E1122 Type 2 diabetes mellitus with diabetic chronic kidney disease: Secondary | ICD-10-CM | POA: Diagnosis not present

## 2020-08-06 DIAGNOSIS — I4891 Unspecified atrial fibrillation: Secondary | ICD-10-CM | POA: Diagnosis not present

## 2020-08-06 DIAGNOSIS — I130 Hypertensive heart and chronic kidney disease with heart failure and stage 1 through stage 4 chronic kidney disease, or unspecified chronic kidney disease: Secondary | ICD-10-CM | POA: Diagnosis not present

## 2020-08-06 DIAGNOSIS — I4892 Unspecified atrial flutter: Secondary | ICD-10-CM | POA: Diagnosis not present

## 2020-08-07 ENCOUNTER — Ambulatory Visit (HOSPITAL_COMMUNITY): Payer: Medicare PPO

## 2020-08-08 ENCOUNTER — Ambulatory Visit: Payer: Medicare PPO | Admitting: Family Medicine

## 2020-08-09 ENCOUNTER — Encounter (HOSPITAL_COMMUNITY): Payer: Self-pay | Admitting: Emergency Medicine

## 2020-08-09 ENCOUNTER — Other Ambulatory Visit: Payer: Self-pay

## 2020-08-09 ENCOUNTER — Telehealth: Payer: Self-pay

## 2020-08-09 ENCOUNTER — Emergency Department (HOSPITAL_COMMUNITY)
Admission: EM | Admit: 2020-08-09 | Discharge: 2020-08-12 | Disposition: A | Discharge status: Other Acute Inpatient Hospital | Payer: Medicare PPO | Attending: Emergency Medicine | Admitting: Emergency Medicine

## 2020-08-09 ENCOUNTER — Telehealth: Payer: Self-pay | Admitting: Family Medicine

## 2020-08-09 DIAGNOSIS — I517 Cardiomegaly: Secondary | ICD-10-CM | POA: Diagnosis not present

## 2020-08-09 DIAGNOSIS — Z79899 Other long term (current) drug therapy: Secondary | ICD-10-CM | POA: Insufficient documentation

## 2020-08-09 DIAGNOSIS — I10 Essential (primary) hypertension: Secondary | ICD-10-CM | POA: Diagnosis not present

## 2020-08-09 DIAGNOSIS — I129 Hypertensive chronic kidney disease with stage 1 through stage 4 chronic kidney disease, or unspecified chronic kidney disease: Secondary | ICD-10-CM | POA: Insufficient documentation

## 2020-08-09 DIAGNOSIS — F03918 Unspecified dementia, unspecified severity, with other behavioral disturbance: Secondary | ICD-10-CM | POA: Insufficient documentation

## 2020-08-09 DIAGNOSIS — G20 Parkinson's disease: Secondary | ICD-10-CM | POA: Insufficient documentation

## 2020-08-09 DIAGNOSIS — N183 Chronic kidney disease, stage 3 unspecified: Secondary | ICD-10-CM | POA: Insufficient documentation

## 2020-08-09 DIAGNOSIS — Z046 Encounter for general psychiatric examination, requested by authority: Secondary | ICD-10-CM | POA: Diagnosis present

## 2020-08-09 DIAGNOSIS — Z7901 Long term (current) use of anticoagulants: Secondary | ICD-10-CM | POA: Diagnosis not present

## 2020-08-09 DIAGNOSIS — R451 Restlessness and agitation: Secondary | ICD-10-CM

## 2020-08-09 DIAGNOSIS — Z96651 Presence of right artificial knee joint: Secondary | ICD-10-CM | POA: Diagnosis not present

## 2020-08-09 DIAGNOSIS — E1122 Type 2 diabetes mellitus with diabetic chronic kidney disease: Secondary | ICD-10-CM | POA: Diagnosis not present

## 2020-08-09 DIAGNOSIS — F0391 Unspecified dementia with behavioral disturbance: Secondary | ICD-10-CM | POA: Insufficient documentation

## 2020-08-09 DIAGNOSIS — F0390 Unspecified dementia without behavioral disturbance: Secondary | ICD-10-CM | POA: Diagnosis not present

## 2020-08-09 DIAGNOSIS — R001 Bradycardia, unspecified: Secondary | ICD-10-CM | POA: Diagnosis not present

## 2020-08-09 DIAGNOSIS — Z20822 Contact with and (suspected) exposure to covid-19: Secondary | ICD-10-CM | POA: Diagnosis not present

## 2020-08-09 DIAGNOSIS — F05 Delirium due to known physiological condition: Secondary | ICD-10-CM | POA: Diagnosis not present

## 2020-08-09 DIAGNOSIS — I4891 Unspecified atrial fibrillation: Secondary | ICD-10-CM | POA: Insufficient documentation

## 2020-08-09 DIAGNOSIS — N390 Urinary tract infection, site not specified: Secondary | ICD-10-CM | POA: Diagnosis not present

## 2020-08-09 DIAGNOSIS — J811 Chronic pulmonary edema: Secondary | ICD-10-CM | POA: Diagnosis not present

## 2020-08-09 DIAGNOSIS — J90 Pleural effusion, not elsewhere classified: Secondary | ICD-10-CM | POA: Diagnosis not present

## 2020-08-09 LAB — COMPREHENSIVE METABOLIC PANEL
ALT: <5 U/L (ref 0–44)
AST: 22 U/L (ref 15–41)
Albumin: 4 g/dL (ref 3.5–5.0)
Alkaline Phosphatase: 61 U/L (ref 38–126)
Anion gap: 11 (ref 5–15)
BUN: 13 mg/dL (ref 8–23)
CO2: 25 mmol/L (ref 22–32)
Calcium: 9.5 mg/dL (ref 8.9–10.3)
Chloride: 105 mmol/L (ref 98–111)
Creatinine, Ser: 1.26 mg/dL — ABNORMAL HIGH (ref 0.44–1.00)
GFR, Estimated: 45 mL/min — ABNORMAL LOW (ref >60)
Glucose, Bld: 128 mg/dL — ABNORMAL HIGH (ref 70–99)
Potassium: 3.2 mmol/L — ABNORMAL LOW (ref 3.5–5.1)
Sodium: 141 mmol/L (ref 135–145)
Total Bilirubin: 0.8 mg/dL (ref 0.3–1.2)
Total Protein: 6.9 g/dL (ref 6.5–8.1)

## 2020-08-09 LAB — CBC WITH DIFFERENTIAL/PLATELET
Abs Immature Granulocytes: 0.04 10*3/uL (ref 0.00–0.07)
Basophils Absolute: 0.1 10*3/uL (ref 0.0–0.1)
Basophils Relative: 1 %
Eosinophils Absolute: 0.1 10*3/uL (ref 0.0–0.5)
Eosinophils Relative: 1 %
HCT: 41.4 % (ref 36.0–46.0)
Hemoglobin: 13.6 g/dL (ref 12.0–15.0)
Immature Granulocytes: 1 %
Lymphocytes Relative: 31 %
Lymphs Abs: 2.7 10*3/uL (ref 0.7–4.0)
MCH: 31.7 pg (ref 26.0–34.0)
MCHC: 32.9 g/dL (ref 30.0–36.0)
MCV: 96.5 fL (ref 80.0–100.0)
Monocytes Absolute: 0.7 10*3/uL (ref 0.1–1.0)
Monocytes Relative: 8 %
Neutro Abs: 5.1 10*3/uL (ref 1.7–7.7)
Neutrophils Relative %: 58 %
Platelets: 302 10*3/uL (ref 150–400)
RBC: 4.29 MIL/uL (ref 3.87–5.11)
RDW: 13.4 % (ref 11.5–15.5)
WBC: 8.7 10*3/uL (ref 4.0–10.5)
nRBC: 0 % (ref 0.0–0.2)

## 2020-08-09 LAB — RAPID URINE DRUG SCREEN, HOSP PERFORMED
Amphetamines: NOT DETECTED
Barbiturates: NOT DETECTED
Benzodiazepines: NOT DETECTED
Cocaine: NOT DETECTED
Opiates: NOT DETECTED
Tetrahydrocannabinol: POSITIVE — AB

## 2020-08-09 LAB — RESP PANEL BY RT-PCR (FLU A&B, COVID) ARPGX2
Influenza A by PCR: NEGATIVE
Influenza B by PCR: NEGATIVE
SARS Coronavirus 2 by RT PCR: NEGATIVE

## 2020-08-09 LAB — URINALYSIS, ROUTINE W REFLEX MICROSCOPIC
Bacteria, UA: NONE SEEN
Bilirubin Urine: NEGATIVE
Glucose, UA: NEGATIVE mg/dL
Hgb urine dipstick: NEGATIVE
Ketones, ur: 5 mg/dL — AB
Leukocytes,Ua: NEGATIVE
Nitrite: NEGATIVE
Protein, ur: 30 mg/dL — AB
Specific Gravity, Urine: 1.024 (ref 1.005–1.030)
pH: 5 (ref 5.0–8.0)

## 2020-08-09 LAB — ACETAMINOPHEN LEVEL: Acetaminophen (Tylenol), Serum: <10 ug/mL — ABNORMAL LOW (ref 10–30)

## 2020-08-09 LAB — SALICYLATE LEVEL: Salicylate Lvl: <7 mg/dL — ABNORMAL LOW (ref 7.0–30.0)

## 2020-08-09 LAB — ETHANOL: Alcohol, Ethyl (B): <10 mg/dL (ref <10)

## 2020-08-09 MED ORDER — APIXABAN 5 MG PO TABS
5.0000 mg | ORAL_TABLET | Freq: Two times a day (BID) | ORAL | Status: DC
  Administered 2020-08-09 – 2020-08-12 (×6): 5 mg via ORAL
  Filled 2020-08-09 (×6): qty 1

## 2020-08-09 MED ORDER — ACETAMINOPHEN 325 MG PO TABS
650.0000 mg | ORAL_TABLET | ORAL | Status: DC | PRN
  Administered 2020-08-10: 650 mg via ORAL
  Filled 2020-08-09: qty 2

## 2020-08-09 MED ORDER — GABAPENTIN 300 MG PO CAPS
900.0000 mg | ORAL_CAPSULE | Freq: Every evening | ORAL | Status: DC
  Administered 2020-08-09 – 2020-08-11 (×3): 900 mg via ORAL
  Filled 2020-08-09 (×3): qty 3

## 2020-08-09 MED ORDER — SERTRALINE HCL 50 MG PO TABS
50.0000 mg | ORAL_TABLET | Freq: Every day | ORAL | Status: DC
Start: 2020-08-09 — End: 2020-08-12
  Administered 2020-08-09 – 2020-08-12 (×4): 50 mg via ORAL
  Filled 2020-08-09 (×5): qty 1

## 2020-08-09 MED ORDER — CARBIDOPA-LEVODOPA 25-100 MG PO TABS
1.0000 | ORAL_TABLET | Freq: Every day | ORAL | Status: DC
  Administered 2020-08-09 – 2020-08-12 (×11): 1 via ORAL
  Filled 2020-08-09 (×17): qty 1

## 2020-08-09 MED ORDER — GABAPENTIN 300 MG PO CAPS: 300.0000 mg | ORAL_CAPSULE | ORAL | Status: DC

## 2020-08-09 MED ORDER — PANTOPRAZOLE SODIUM 40 MG PO TBEC
40.0000 mg | DELAYED_RELEASE_TABLET | Freq: Every day | ORAL | Status: DC
  Administered 2020-08-09 – 2020-08-12 (×4): 40 mg via ORAL
  Filled 2020-08-09 (×4): qty 1

## 2020-08-09 MED ORDER — METOPROLOL TARTRATE 25 MG PO TABS
50.0000 mg | ORAL_TABLET | Freq: Two times a day (BID) | ORAL | Status: DC
  Administered 2020-08-09 – 2020-08-12 (×5): 50 mg via ORAL
  Filled 2020-08-09 (×6): qty 2

## 2020-08-09 MED ORDER — TRAMADOL HCL 50 MG PO TABS
50.0000 mg | ORAL_TABLET | Freq: Two times a day (BID) | ORAL | Status: DC | PRN
  Administered 2020-08-12: 50 mg via ORAL
  Filled 2020-08-09: qty 1

## 2020-08-09 MED ORDER — GABAPENTIN 300 MG PO CAPS
600.0000 mg | ORAL_CAPSULE | Freq: Two times a day (BID) | ORAL | Status: DC
  Administered 2020-08-10 – 2020-08-12 (×4): 600 mg via ORAL
  Filled 2020-08-09 (×4): qty 2

## 2020-08-09 MED ORDER — ONDANSETRON HCL 4 MG PO TABS: 4.0000 mg | ORAL_TABLET | Freq: Three times a day (TID) | ORAL | Status: DC | PRN

## 2020-08-09 NOTE — Telephone Encounter (Signed)
See my other phone note today's date and current time

## 2020-08-09 NOTE — Progress Notes (Addendum)
FPTS Interim Progress Note   Received call from Dr. Dina Rich, ED provider regarding patient arriving to the ED under IVC after patient and husband arguing but fully oriented although husband reports disorientation. Dr. Dina Rich conveyed that patient likely does not need admission but wanted to run this by our team as she is followed in our clinic outpatient. Likely that patient is sundowning in the setting of her Parkinson's and ongoing memory concerns likely due to dementia. It seems that family would benefit from medication and further resources to ensure optimal therapy. I agree that patient does not have any acute medical issues that would warrant hospital admission at this time but would greatly benefit from close follow up to further discuss patient and husband's concerns. Scheduled appointment in our family medicine clinic for 4/29 to discuss possible addition of seroquel to aid in sundowning. Patient also has neurology outpatient follow up on 5/3. Patient would also benefit from formal psychiatry evaluation at this time. We will work to ensure that patient maintains close follow up with providers so that family has appropriate resources that that need. Appreciate discussion with Dr. Dina Rich.   Donney Dice, DO 08/09/2020, 6:04 AM PGY-1, [place] Medicine Service pager [phone]

## 2020-08-09 NOTE — ED Notes (Signed)
TTS being done

## 2020-08-09 NOTE — ED Notes (Signed)
Belongings placed in locker #12 1 bag

## 2020-08-09 NOTE — Care Management (Signed)
Writer referred patient to the following facilities:   [facility] Details  Fax          [address], [place] [address]     Internal comment    [facility] Details  Fax        [place], [place] [address]     Internal comment    [facility] Details  Fax        [place], [place] [address]     Internal comment    [facility] Details  Fax        [address] [place], [place] [address]     Internal comment    [facility] Details  Fax        [address] [place] [address], [place] [address]     Internal comment    [facility] Details  Fax        [address], [place] [address]     Internal comment

## 2020-08-09 NOTE — ED Notes (Signed)
Assuming care. Pt laying in stretcher @ this time w/ NAD noted. RR e/u on RA. Bed low and locked. Side rails up x2.

## 2020-08-09 NOTE — ED Triage Notes (Addendum)
Per IVC paperwork pt has Parkinson and hx of UTI (was hospitalized last week).  Since being released from the hospital she has not taken any of her medications. Pt is only oriented to self.  Pt became threatening to spouse.    Pt has a DNR with her    Darcia Lampi - spouse - [phone]
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
## 2020-08-09 NOTE — Progress Notes (Deleted)
   Subjective:   Patient ID: [name]    DOB: 1945-07-24, 75 y.o. female   MRN: [id number]  [name] is a 75 y.o. female with a history of parkinson's disease and dementia, HTN, new onset Afib, pulmonary HTN, hiatal hernia with GERD and esophagitis, dysphagia, CKD III, DM, confusion, dementia, parkinson's disse, osteoarthritis, scoliosis, AMS, B12 deficiency, chronic pain, cognitive impairment, DOE, frequent alls, HLD, MDD, substance abuse here for ED follow up  HPI: Patient was seen in ED on 08/09/20 for behavorial changes in setting of known parkinson's disease and dementia concerning. Evaluatd by psych who felt inpatient admissionw as warranted***  Review of Systems:  Per HPI.   Objective:   There were no vitals taken for this visit. Vitals and nursing note reviewed.  General: pleasant ***, sitting comfortably in exam chair, well nourished, well developed, in no acute distress with non-toxic appearance HEENT: normocephalic, atraumatic, moist mucous membranes, oropharynx clear without erythema or exudate, TM normal bilaterally  Neck: supple, non-tender without lymphadenopathy CV: regular rate and rhythm without murmurs, rubs, or gallops, no lower extremity edema, 2+ radial and pedal pulses bilaterally Lungs: clear to auscultation bilaterally with normal work of breathing on room air Resp: breathing comfortably on room air, speaking in full sentences Abdomen: soft, non-tender, non-distended, no masses or organomegaly palpable, normoactive bowel sounds Skin: warm, dry, no rashes or lesions Extremities: warm and well perfused, normal tone MSK: ROM grossly intact, strength intact, gait normal Neuro: Alert and oriented, speech normal  Assessment & Plan:   No problem-specific Assessment & Plan notes found for this encounter.  No orders of the defined types were placed in this encounter.  No orders of the defined types were placed in this encounter.   {    This  will disappear when note is signed, click to select method of visit    :1}  Mina Marble, DO PGY-3, [facility] 08/09/2020 8:45 PM

## 2020-08-09 NOTE — Telephone Encounter (Signed)
The 08/10/20 appt. for pt has been cancelled due to her being in the hospital. [name] would like for Dr. Nori Riis to call him at ph# [phone] to discuss issues regarding patient.
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
## 2020-08-09 NOTE — ED Notes (Addendum)
Pt husband would like to be contacted at number in chart when update available from TTS. Husband also would like a call if pt wakes up and requests to see him.

## 2020-08-09 NOTE — ED Notes (Signed)
Pt wand by security, pt cleared according to security.
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
## 2020-08-09 NOTE — ED Notes (Signed)
Called husband Timmothy Sours [phone]

## 2020-08-09 NOTE — Telephone Encounter (Signed)
Patients husband calls nurse line requesting to speak to PCP about current ED status. Please call him at [phone].
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
## 2020-08-10 ENCOUNTER — Ambulatory Visit: Payer: Medicare PPO

## 2020-08-10 ENCOUNTER — Emergency Department (HOSPITAL_COMMUNITY): Payer: Medicare PPO

## 2020-08-10 DIAGNOSIS — G20 Parkinson's disease: Secondary | ICD-10-CM | POA: Diagnosis not present

## 2020-08-10 DIAGNOSIS — J811 Chronic pulmonary edema: Secondary | ICD-10-CM | POA: Diagnosis not present

## 2020-08-10 DIAGNOSIS — Z20822 Contact with and (suspected) exposure to covid-19: Secondary | ICD-10-CM | POA: Diagnosis not present

## 2020-08-10 DIAGNOSIS — N390 Urinary tract infection, site not specified: Secondary | ICD-10-CM | POA: Diagnosis not present

## 2020-08-10 DIAGNOSIS — I4891 Unspecified atrial fibrillation: Secondary | ICD-10-CM | POA: Diagnosis not present

## 2020-08-10 DIAGNOSIS — J90 Pleural effusion, not elsewhere classified: Secondary | ICD-10-CM | POA: Diagnosis not present

## 2020-08-10 DIAGNOSIS — E1122 Type 2 diabetes mellitus with diabetic chronic kidney disease: Secondary | ICD-10-CM | POA: Diagnosis not present

## 2020-08-10 DIAGNOSIS — F05 Delirium due to known physiological condition: Secondary | ICD-10-CM | POA: Diagnosis not present

## 2020-08-10 DIAGNOSIS — I517 Cardiomegaly: Secondary | ICD-10-CM | POA: Diagnosis not present

## 2020-08-10 DIAGNOSIS — F0390 Unspecified dementia without behavioral disturbance: Secondary | ICD-10-CM | POA: Diagnosis not present

## 2020-08-10 DIAGNOSIS — Z96651 Presence of right artificial knee joint: Secondary | ICD-10-CM | POA: Diagnosis not present

## 2020-08-10 DIAGNOSIS — Z7901 Long term (current) use of anticoagulants: Secondary | ICD-10-CM | POA: Diagnosis not present

## 2020-08-10 DIAGNOSIS — I10 Essential (primary) hypertension: Secondary | ICD-10-CM | POA: Diagnosis not present

## 2020-08-10 DIAGNOSIS — R001 Bradycardia, unspecified: Secondary | ICD-10-CM | POA: Diagnosis not present

## 2020-08-10 DIAGNOSIS — R451 Restlessness and agitation: Secondary | ICD-10-CM | POA: Diagnosis not present

## 2020-08-10 LAB — URINE CULTURE: Culture: NO GROWTH

## 2020-08-10 NOTE — ED Provider Notes (Signed)
Emergency Medicine Observation Re-evaluation Note  [name] is a 75 y.o. female, seen on rounds today.  Pt initially presented to the ED for complaints of IVC and Aggressive Behavior Currently, the patient is calm, comfortable, directable.  Physical Exam  BP 135/88 (BP Location: Right Arm)   Pulse 61   Temp 98.2 F (36.8 C) (Oral)   Resp 14   SpO2 100%  Physical Exam General: NAD, comfortable, directable, ambulatory  ED Course / MDM  EKG:   I have reviewed the labs performed to date as well as medications administered while in observation.  Recent changes in the last 24 hours include no events reported. Patient without acute complaint.  Plan  Current plan is for Geri-Psych placement. Patient is under full IVC at this time.   Valarie Merino, MD 08/10/20 [phone]

## 2020-08-10 NOTE — Progress Notes (Signed)
CSW spoke with Rhodie at [facility].  She reports that she attempted to present the pts case tot he physician, however was asked by the physician "to wait until you have more".  She indicates that the physician is requesting that she not bring single cases to her but a stack of referrals at once.  She reports that physician is "trying to take care of the patients that we do have".  She reports that physician will review later in the day.  CSW called [facility] and spoke with [place].  She reports that IVC paperwork is needed for review as the pt's husband had the pt IVCed.  CSW spoke with pt's nurse Carlis Abbott and requested that he fax IVC paperwork to [phone].  He reports that he will.  Assunta Curtis, MSW, LCSW 08/10/2020 1:52 PM

## 2020-08-10 NOTE — ED Notes (Signed)
Ambulatory to bathroom. NAD noted. Pleasant and cooperative.

## 2020-08-10 NOTE — ED Notes (Signed)
Daughter [phone] would like an update when possible

## 2020-08-10 NOTE — Progress Notes (Signed)
Patient meets inpatient criteria per Dr. Dwyane Dee.    Patient was referred to the following facilities:   [facility]  [address] [place] [address] [phone] [facility]  [address], [place] [address] [phone] [facility]  [place], [place] [address] [phone] [facility]  [address] [place] [address] [phone] [place], [place] [address] [facility]  [address] [place] [address] [phone] [facility]  [address] [place] [address] [phone] [place]  [address] [place] [address] [phone]  [facility]  [address] [place] [address] [phone] [place]  [address] [place] [address] [phone] [place]  [address] [place] [address] [place]  [facility]  [address] [place] [address] [phone]  [facility]  [address], [place] [address] [phone]  [facility]  [address], [place] [address] [phone]  [facility]  [address], [place] [address] [phone] [facility]  [address], [place] [address] [phone]     CSW will continue to monitor for disposition.  Assunta Curtis, MSW, LCSW 08/10/2020 10:08 AM

## 2020-08-10 NOTE — ED Notes (Signed)
Scheduled medication given.Pt denies needs or concerns @ this time. Laying in Biomedical scientist w/ eyes closed. NAD. Bed low and locked.

## 2020-08-10 NOTE — ED Notes (Signed)
Laying in Biomedical scientist, Eyes closed. Even rise and fall of chest. Bed low and locked. Side rails x 2.

## 2020-08-10 NOTE — BH Assessment (Addendum)
Per nursing at [facility]), patient tentatively accepted to [facility] pending IVC, Chest X-ray results, CBC, and EKG results are Faxed to  [phone]. (Chest X-Ray and EKG have pending results). Provided updates to patient's nurse Loma Sousa who will fax a copy of patient's IVC to [facility]. This Probation officer will fax the alternative results for [place] to review for official acceptance to their facility.  Addendum: Disposition Counselor faxed the CBC, Chest x-ray, and EKG results to [facility] @ 2130. Confirmed that IVC papers were also faxed. CSW to follow up with [facility] regarding patient's acceptance.

## 2020-08-11 ENCOUNTER — Other Ambulatory Visit: Payer: Self-pay

## 2020-08-11 DIAGNOSIS — R451 Restlessness and agitation: Secondary | ICD-10-CM | POA: Diagnosis not present

## 2020-08-11 DIAGNOSIS — R001 Bradycardia, unspecified: Secondary | ICD-10-CM | POA: Diagnosis not present

## 2020-08-11 DIAGNOSIS — N390 Urinary tract infection, site not specified: Secondary | ICD-10-CM | POA: Diagnosis not present

## 2020-08-11 LAB — URINE DRUGS OF ABUSE SCREEN W ALC, ROUTINE (REF LAB)
Amphetamines, Urine: NEGATIVE ng/mL
Barbiturate, Ur: NEGATIVE ng/mL
Benzodiazepine Quant, Ur: NEGATIVE ng/mL
Cocaine (Metab.): NEGATIVE ng/mL
Ethanol U, Quan: NEGATIVE %
Methadone Screen, Urine: NEGATIVE ng/mL
Opiate Quant, Ur: NEGATIVE ng/mL
Phencyclidine, Ur: NEGATIVE ng/mL
Propoxyphene, Urine: NEGATIVE ng/mL

## 2020-08-11 LAB — PANEL 799049: Cannabinoid GC/MS, Ur: NEGATIVE

## 2020-08-11 NOTE — ED Notes (Signed)
IVC paperwork faxed to [facility].

## 2020-08-11 NOTE — ED Provider Notes (Signed)
Emergency Medicine Observation Re-evaluation Note  Sulema Braid is a 75 y.o. female, seen on rounds today.  Pt initially presented to the ED for complaints of periods of agitated behaviors.   Physical Exam  BP (!) 136/55   Pulse 60   Temp 97.7 F (36.5 C) (Oral)   Resp 18   SpO2 98%  Physical Exam General: alert, content, nad Cardiac: regular rate Lungs: breathing comfortably Psych: calm, alert. Normal mood/affect.   ED Course / MDM  EKG:EKG Interpretation  Date/Time:  Friday August 10 2020 18:55:03 EDT Ventricular Rate:  57 PR Interval:  156 QRS Duration: 92 QT Interval:  426 QTC Calculation: 414 R Axis:   6 Text Interpretation: Sinus bradycardia Nonspecific ST and T wave abnormality Abnormal ECG Since last tracing rate slower Confirmed by Isla Pence [phone]) on 08/10/2020 7:06:44 PM Also confirmed by Isla Pence [phone]), editor Hattie Perch ([id number])  on 08/11/2020 11:27:05 AM   I have reviewed the labs performed to date as well as medications administered while in observation.  Recent changes in the last 24 hours include stabilization on meds, BH reassessment, and possible inpatient geropsych treatment.   Plan  Current plan is for possible inpatient treatment at [facility] team is working on placement.      Lajean Saver, MD 08/11/20 [phone]

## 2020-08-11 NOTE — Progress Notes (Signed)
CSW received call from [place] [facility] inquiring about this patient. Rhondi, Therapist, sports from [place] requesting a copy of IVC paperwork. CSW notified the patient's nurse via secure chat and provided fax [phone].  Glennie Isle, MSW, [place], LCAS-A Phone: [phone] Disposition/TOC

## 2020-08-11 NOTE — ED Notes (Signed)
[name] [phone] ... Husband was updated & request to be informed with any further update.

## 2020-08-11 NOTE — ED Notes (Signed)
Pt is taking a shower with her sitter assisting her.

## 2020-08-11 NOTE — ED Notes (Signed)
[name] (Daughter) called asking for an update [phone]

## 2020-08-11 NOTE — ED Notes (Signed)
Front & back copy of Findings & Custody was faxed to [facility] at [facility] request.

## 2020-08-11 NOTE — ED Notes (Signed)
Pt making a phone call, telling someone to meet her in town in the morning.

## 2020-08-12 DIAGNOSIS — G20 Parkinson's disease: Secondary | ICD-10-CM | POA: Diagnosis not present

## 2020-08-12 DIAGNOSIS — E876 Hypokalemia: Secondary | ICD-10-CM | POA: Diagnosis not present

## 2020-08-12 DIAGNOSIS — M1712 Unilateral primary osteoarthritis, left knee: Secondary | ICD-10-CM | POA: Diagnosis not present

## 2020-08-12 DIAGNOSIS — F0391 Unspecified dementia with behavioral disturbance: Secondary | ICD-10-CM | POA: Diagnosis not present

## 2020-08-12 DIAGNOSIS — M170 Bilateral primary osteoarthritis of knee: Secondary | ICD-10-CM | POA: Diagnosis not present

## 2020-08-12 DIAGNOSIS — N390 Urinary tract infection, site not specified: Secondary | ICD-10-CM | POA: Diagnosis not present

## 2020-08-12 DIAGNOSIS — R001 Bradycardia, unspecified: Secondary | ICD-10-CM | POA: Diagnosis not present

## 2020-08-12 DIAGNOSIS — I10 Essential (primary) hypertension: Secondary | ICD-10-CM | POA: Diagnosis not present

## 2020-08-12 DIAGNOSIS — M25461 Effusion, right knee: Secondary | ICD-10-CM | POA: Diagnosis not present

## 2020-08-12 DIAGNOSIS — M25562 Pain in left knee: Secondary | ICD-10-CM | POA: Diagnosis not present

## 2020-08-12 DIAGNOSIS — K449 Diaphragmatic hernia without obstruction or gangrene: Secondary | ICD-10-CM | POA: Diagnosis not present

## 2020-08-12 DIAGNOSIS — R296 Repeated falls: Secondary | ICD-10-CM | POA: Diagnosis not present

## 2020-08-12 DIAGNOSIS — E1122 Type 2 diabetes mellitus with diabetic chronic kidney disease: Secondary | ICD-10-CM | POA: Diagnosis not present

## 2020-08-12 DIAGNOSIS — N1831 Chronic kidney disease, stage 3a: Secondary | ICD-10-CM | POA: Diagnosis not present

## 2020-08-12 DIAGNOSIS — E7800 Pure hypercholesterolemia, unspecified: Secondary | ICD-10-CM | POA: Diagnosis not present

## 2020-08-12 DIAGNOSIS — I480 Paroxysmal atrial fibrillation: Secondary | ICD-10-CM | POA: Diagnosis not present

## 2020-08-12 DIAGNOSIS — F0281 Dementia in other diseases classified elsewhere with behavioral disturbance: Secondary | ICD-10-CM | POA: Diagnosis not present

## 2020-08-12 DIAGNOSIS — I129 Hypertensive chronic kidney disease with stage 1 through stage 4 chronic kidney disease, or unspecified chronic kidney disease: Secondary | ICD-10-CM | POA: Diagnosis not present

## 2020-08-12 DIAGNOSIS — R451 Restlessness and agitation: Secondary | ICD-10-CM | POA: Diagnosis not present

## 2020-08-12 DIAGNOSIS — K219 Gastro-esophageal reflux disease without esophagitis: Secondary | ICD-10-CM | POA: Diagnosis not present

## 2020-08-12 DIAGNOSIS — Z96651 Presence of right artificial knee joint: Secondary | ICD-10-CM | POA: Diagnosis not present

## 2020-08-12 NOTE — ED Notes (Signed)
Sheriff called back, will be here in 15 mins to transport.

## 2020-08-12 NOTE — ED Notes (Signed)
Transport called. No ETA given.

## 2020-08-12 NOTE — ED Notes (Signed)
[employer] called back saying this [place] of [place] so they cannot do transfer. Gave me number for [facility] [phone], message left.

## 2020-08-12 NOTE — ED Notes (Signed)
Breakfast Ordered 

## 2020-08-12 NOTE — ED Notes (Addendum)
Laying in Biomedical scientist with eye open, sitter @ bedside. NAD. Pt denies needs or concerns. Scheduled medication given.

## 2020-08-12 NOTE — ED Notes (Signed)
Pt complaining of pain in back. RN asked if wanted medication pt stated "yes." PRN tramadol given. Pt also informed RN she was hungry, Cheese sticks given and crackers.

## 2020-08-12 NOTE — ED Notes (Signed)
Ambulatory to bathroom. Talking w/ safety attendant. Pleasant and cooperative. NAD.

## 2020-08-12 NOTE — ED Notes (Signed)
Again out of room. Stated was going to bathroom. Bypassed bathroom and began walking toward doors to outside hall. RN made pt aware could not go out the doors, pt proceeded to sit in chair. Pt then again said she had to use the bathroom and again bypassed bathroom and made way for doors on other side of zone." RN stood in front of doors and pt again was redirected asked if had to se bathroom pt said " don't talk to me, leave me alone." RN and sitter were able to get pt bsck in room. Pt is currently laying on side in stretcher w/ blanket over her. When asked if has any needs or concerns @ present time says "no."

## 2020-08-12 NOTE — ED Notes (Signed)
Pt burst out of room into nursing station. RN and sitter attempted to redirected pt, pt stated "don't talk to me like that." Pt then walked other way and attempted to leave through double doors. RN redirected pt back to room. Pt yelled "dont you put your hands on me." RN attempted to reorient pt, but pt responded by repeating what RN was saying in mocking manner." Pt sat on bed for few minutes and annoyed w/ this RN and stated "I'm gonna call the police on you and proceeded to run hands down arms. Pt then scooted back in bed and folded pillow and is currently laying on side in stretcher. When asked if needed anything pt stated "I don't need a thing."

## 2020-08-12 NOTE — ED Notes (Signed)
Ambulatory to bathroom w/ sitter. NAD.

## 2020-08-12 NOTE — ED Provider Notes (Signed)
Emergency Medicine Observation Re-evaluation Note  [name] is a 75 y.o. female, seen on rounds today.  Pt initially presented to the ED for complaints of IVC and Aggressive Behavior Currently, the patient is resting comfortably.  Physical Exam  BP 124/75 (BP Location: Left Arm)   Pulse 66   Temp 98.5 F (36.9 C) (Oral)   Resp 16   SpO2 99%  Physical Exam General: resting in bed Cardiac: warm and well perfused Lungs: even and unlabored Psych: calm  ED Course / MDM  EKG:EKG Interpretation  Date/Time:  Friday August 10 2020 18:55:03 EDT Ventricular Rate:  57 PR Interval:  156 QRS Duration: 92 QT Interval:  426 QTC Calculation: 414 R Axis:   6 Text Interpretation: Sinus bradycardia Nonspecific ST and T wave abnormality Abnormal ECG Since last tracing rate slower Confirmed by Isla Pence [phone]) on 08/10/2020 7:06:44 PM Also confirmed by Isla Pence [phone]), editor Hattie Perch ([id number])  on 08/11/2020 11:27:05 AM   I have reviewed the labs performed to date as well as medications administered while in observation.  Recent changes in the last 24 hours include no events, still waiting on placement.  Plan  Current plan is for placement per psych team, likely [place]. Patient is under full IVC at this time.   Lucrezia Starch, MD 08/12/20 [phone]

## 2020-08-12 NOTE — ED Notes (Signed)
Ambulatory to bathroom, voided x 1. Resting back in bed. Bed low & locked. Side rails up x2.

## 2020-08-12 NOTE — ED Notes (Addendum)
Again out of room. Able to be redirected back to room. States "I'm gonna talk to the doctor about you, you need help."

## 2020-08-12 NOTE — Progress Notes (Signed)
Per Rhondi,RN, pt has been accepted to [facility], room 421. Accepting provider is Dr. Braulio Conte, Attending provider is Dr. Reatha Armour. Patient can arrive at anytime. Number for report is [phone].   Glennie Isle, MSW, LCSW-A Phone: [phone] Disposition/TOC

## 2020-08-12 NOTE — ED Notes (Signed)
Report called to Rhondi, Therapist, sports at [employer]

## 2020-08-13 ENCOUNTER — Telehealth: Payer: Self-pay

## 2020-08-13 NOTE — Telephone Encounter (Signed)
[name] patients daughter LVM on nurse line requesting PCP to call her in regards to her mothers current hospital stay. [name] reports she doesn't even know where her mother is currently. The ED told her she was transferred to a [place] facility, however when she called them they would not give her any information. [facility] is on file for [place] to speak with [name]. Will forward to PCP.   [name]: [phone]

## 2020-08-14 ENCOUNTER — Ambulatory Visit: Payer: Medicare HMO | Admitting: Diagnostic Neuroimaging

## 2020-08-14 NOTE — Telephone Encounter (Signed)
I have dialed that number several times and get a recording saying call cannot be connected at this time. Will dbl check number (via Michelle's chart) [name] [phone] [facility])  [phone] (M)   I think number is transposed [phone]  I spoke w [name]  She has spoken with her Mom today

## 2020-08-15 ENCOUNTER — Ambulatory Visit: Payer: Medicare PPO | Admitting: Family Medicine

## 2020-08-24 ENCOUNTER — Ambulatory Visit: Payer: Medicare PPO | Admitting: Cardiology

## 2020-08-28 ENCOUNTER — Encounter (HOSPITAL_COMMUNITY): Payer: Self-pay | Admitting: Cardiology

## 2020-08-29 ENCOUNTER — Telehealth: Payer: Self-pay | Admitting: *Deleted

## 2020-08-29 DIAGNOSIS — F0280 Dementia in other diseases classified elsewhere without behavioral disturbance: Secondary | ICD-10-CM | POA: Diagnosis not present

## 2020-08-29 DIAGNOSIS — I130 Hypertensive heart and chronic kidney disease with heart failure and stage 1 through stage 4 chronic kidney disease, or unspecified chronic kidney disease: Secondary | ICD-10-CM | POA: Diagnosis not present

## 2020-08-29 DIAGNOSIS — I4891 Unspecified atrial fibrillation: Secondary | ICD-10-CM | POA: Diagnosis not present

## 2020-08-29 DIAGNOSIS — I509 Heart failure, unspecified: Secondary | ICD-10-CM | POA: Diagnosis not present

## 2020-08-29 DIAGNOSIS — I4892 Unspecified atrial flutter: Secondary | ICD-10-CM | POA: Diagnosis not present

## 2020-08-29 DIAGNOSIS — N183 Chronic kidney disease, stage 3 unspecified: Secondary | ICD-10-CM | POA: Diagnosis not present

## 2020-08-29 DIAGNOSIS — N390 Urinary tract infection, site not specified: Secondary | ICD-10-CM | POA: Diagnosis not present

## 2020-08-29 DIAGNOSIS — E1122 Type 2 diabetes mellitus with diabetic chronic kidney disease: Secondary | ICD-10-CM | POA: Diagnosis not present

## 2020-08-29 DIAGNOSIS — G3183 Dementia with Lewy bodies: Secondary | ICD-10-CM | POA: Diagnosis not present

## 2020-08-29 NOTE — Telephone Encounter (Signed)
Bobin from [place] calling for [facility] PT verbal orders as follows:  1 time(s) weekly for 4 week(s)  Verbal orders given per [facility] protocol  Christen Bame, CMA

## 2020-08-30 ENCOUNTER — Other Ambulatory Visit: Payer: Self-pay

## 2020-08-30 DIAGNOSIS — F3341 Major depressive disorder, recurrent, in partial remission: Secondary | ICD-10-CM | POA: Diagnosis not present

## 2020-08-31 DIAGNOSIS — I4891 Unspecified atrial fibrillation: Secondary | ICD-10-CM | POA: Diagnosis not present

## 2020-08-31 DIAGNOSIS — N183 Chronic kidney disease, stage 3 unspecified: Secondary | ICD-10-CM | POA: Diagnosis not present

## 2020-08-31 DIAGNOSIS — G3183 Dementia with Lewy bodies: Secondary | ICD-10-CM | POA: Diagnosis not present

## 2020-08-31 DIAGNOSIS — I4892 Unspecified atrial flutter: Secondary | ICD-10-CM | POA: Diagnosis not present

## 2020-08-31 DIAGNOSIS — N390 Urinary tract infection, site not specified: Secondary | ICD-10-CM | POA: Diagnosis not present

## 2020-08-31 DIAGNOSIS — F0280 Dementia in other diseases classified elsewhere without behavioral disturbance: Secondary | ICD-10-CM | POA: Diagnosis not present

## 2020-08-31 DIAGNOSIS — E1122 Type 2 diabetes mellitus with diabetic chronic kidney disease: Secondary | ICD-10-CM | POA: Diagnosis not present

## 2020-08-31 DIAGNOSIS — I130 Hypertensive heart and chronic kidney disease with heart failure and stage 1 through stage 4 chronic kidney disease, or unspecified chronic kidney disease: Secondary | ICD-10-CM | POA: Diagnosis not present

## 2020-08-31 DIAGNOSIS — I509 Heart failure, unspecified: Secondary | ICD-10-CM | POA: Diagnosis not present

## 2020-09-04 ENCOUNTER — Telehealth: Payer: Self-pay

## 2020-09-04 DIAGNOSIS — I480 Paroxysmal atrial fibrillation: Secondary | ICD-10-CM | POA: Diagnosis not present

## 2020-09-04 DIAGNOSIS — F0281 Dementia in other diseases classified elsewhere with behavioral disturbance: Secondary | ICD-10-CM | POA: Diagnosis not present

## 2020-09-04 DIAGNOSIS — M1030 Gout due to renal impairment, unspecified site: Secondary | ICD-10-CM | POA: Diagnosis not present

## 2020-09-04 DIAGNOSIS — I130 Hypertensive heart and chronic kidney disease with heart failure and stage 1 through stage 4 chronic kidney disease, or unspecified chronic kidney disease: Secondary | ICD-10-CM | POA: Diagnosis not present

## 2020-09-04 DIAGNOSIS — I4892 Unspecified atrial flutter: Secondary | ICD-10-CM | POA: Diagnosis not present

## 2020-09-04 DIAGNOSIS — G3183 Dementia with Lewy bodies: Secondary | ICD-10-CM | POA: Diagnosis not present

## 2020-09-04 DIAGNOSIS — E1122 Type 2 diabetes mellitus with diabetic chronic kidney disease: Secondary | ICD-10-CM | POA: Diagnosis not present

## 2020-09-04 DIAGNOSIS — I509 Heart failure, unspecified: Secondary | ICD-10-CM | POA: Diagnosis not present

## 2020-09-04 DIAGNOSIS — N1831 Chronic kidney disease, stage 3a: Secondary | ICD-10-CM | POA: Diagnosis not present

## 2020-09-04 NOTE — Telephone Encounter (Signed)
Received phone call from [place], [place] with [facility] regarding patient.   Reports that patient had elevated BP during visit today. BP was 166/72. Patient reported having headache earlier today that was relieved with tylenol. Denies CP, SHOB, blurry vision. Husband will continue to monitor BP at home and record readings.   [place] RN also reports that patient had a fall over the weekend. There was no injury with this fall.   Patient also reports that she has been randomly having hallucinations since leaving the hospital. Reports that she will be "sitting on the side of the bed and my brain will be somewhere else." Patient denies SI, thoughts of self harm or harming others. Home health RN reports that patient is alert and oriented at this time.   Provided with strict ED precautions.   Please advise any additional recommendations. Patient is scheduled for follow up with PCP on 09/12/2020.  Talbot Grumbling, RN

## 2020-09-05 ENCOUNTER — Ambulatory Visit: Payer: Medicare PPO | Admitting: Family Medicine

## 2020-09-05 ENCOUNTER — Other Ambulatory Visit: Payer: Self-pay

## 2020-09-05 DIAGNOSIS — I421 Obstructive hypertrophic cardiomyopathy: Secondary | ICD-10-CM

## 2020-09-05 DIAGNOSIS — I4892 Unspecified atrial flutter: Secondary | ICD-10-CM | POA: Diagnosis not present

## 2020-09-05 DIAGNOSIS — F0281 Dementia in other diseases classified elsewhere with behavioral disturbance: Secondary | ICD-10-CM | POA: Diagnosis not present

## 2020-09-05 DIAGNOSIS — I272 Pulmonary hypertension, unspecified: Secondary | ICD-10-CM

## 2020-09-05 DIAGNOSIS — E1122 Type 2 diabetes mellitus with diabetic chronic kidney disease: Secondary | ICD-10-CM | POA: Diagnosis not present

## 2020-09-05 DIAGNOSIS — I480 Paroxysmal atrial fibrillation: Secondary | ICD-10-CM | POA: Diagnosis not present

## 2020-09-05 DIAGNOSIS — N1831 Chronic kidney disease, stage 3a: Secondary | ICD-10-CM | POA: Diagnosis not present

## 2020-09-05 DIAGNOSIS — I130 Hypertensive heart and chronic kidney disease with heart failure and stage 1 through stage 4 chronic kidney disease, or unspecified chronic kidney disease: Secondary | ICD-10-CM | POA: Diagnosis not present

## 2020-09-05 DIAGNOSIS — G3183 Dementia with Lewy bodies: Secondary | ICD-10-CM | POA: Diagnosis not present

## 2020-09-05 DIAGNOSIS — I509 Heart failure, unspecified: Secondary | ICD-10-CM | POA: Diagnosis not present

## 2020-09-05 DIAGNOSIS — M1030 Gout due to renal impairment, unspecified site: Secondary | ICD-10-CM | POA: Diagnosis not present

## 2020-09-06 NOTE — Progress Notes (Deleted)
09/06/2020 [name] [id number] May 31, 1945   Chief Complaint:  History of Present Illness: She has history of hypertension, chronic GERD, prior acute ulcerative esophagitis diagnosed by EGD July 2019, chronic kidney disease stage III, adult onset diabetes mellitus, episodic substance abuse, depression, mild cognitive impairment and Parkinson's disease.  EGD 07/12/2020: - LA Grade D reflux esophagitis with distal esophageal stricture. Biopsied. Dilated. - 2 cm hiatal hernia. - Mild gastritis.  CBC Latest Ref Rng & Units 08/09/2020 07/30/2020 07/28/2020  WBC 4.0 - 10.5 K/uL 8.7 7.6 5.9  Hemoglobin 12.0 - 15.0 g/dL 13.6 12.7 12.3  Hematocrit 36.0 - 46.0 % 41.4 37.8 36.1  Platelets 150 - 400 K/uL 302 267 242    CMP Latest Ref Rng & Units 08/09/2020 07/30/2020 07/29/2020  Glucose 70 - 99 mg/dL 128(H) 125(H) 111(H)  BUN 8 - 23 mg/dL 13 9 9   Creatinine 0.44 - 1.00 mg/dL 1.26(H) 1.20(H) 1.18(H)  Sodium 135 - 145 mmol/L 141 139 142  Potassium 3.5 - 5.1 mmol/L 3.2(L) 3.1(L) 3.8  Chloride 98 - 111 mmol/L 105 109 108  CO2 22 - 32 mmol/L 25 23 27   Calcium 8.9 - 10.3 mg/dL 9.5 9.3 9.3  Total Protein 6.5 - 8.1 g/dL 6.9 - -  Total Bilirubin 0.3 - 1.2 mg/dL 0.8 - -  Alkaline Phos 38 - 126 U/L 61 - -  AST 15 - 41 U/L 22 - -  ALT 0 - 44 U/L <5 - -   Past Medical History:  Diagnosis Date  . Abdominal pain, chronic, right upper quadrant 09/22/2019  . Acute esophagitis   . Allergy   . Anxiety   . Arthritis    "knees, back" (06/29/2014)  . ARTHRITIS, BACK 03/28/2009   Qualifier: Diagnosis of  By: Nori Riis MD, [name]    . CARPAL TUNNEL SYNDROME, LEFT 01/05/2008   Qualifier: Diagnosis of  By: Nori Riis MD, [name]    . Cataract   . Chronic bronchitis ([place])    "get it q yr"  . Chronic mid back pain   . Cognitive impairment 12/11/2013  . Complication of anesthesia 07/2008   "hard to get me woke up when I had my knee replaced; they said they had to bring me back"  . Dementia ([place])    "I have  some; not dx'd by a dr" (08/06/2017)  . Diabetes mellitus type 2, diet-controlled ([place]) 08/05/2006       . Disorder of bursae and tendons in shoulder region 01/24/2009   Qualifier: Diagnosis of  By: Nori Riis MD, [name]    . Esophageal abnormality 08/12/2010   Barium swallow 12/2011  IMPRESSION: Esophageal dysmotility.  No fixed esophageal narrowing or stricture. However, the barium tablet was transiently delayed at the GE junction.  Postsurgical changes at the GE junction. Gastroesophageal reflux is suspected, but could not be confirmed due to the patient's inability to clear her esophagus in the prone position.    . Esophageal stricture   . GERD (gastroesophageal reflux disease)   . Greater trochanteric bursitis of right hip 07/14/2019  . Hematemesis 08/04/2017  . Hiatal hernia   . History of gout 1970's  . Hyperlipidemia   . Hypertension   . Major depressive disorder, recurrent episode ([place]) 06/11/2006   Qualifier: Diagnosis of  By: Beryle Lathe    . Movement disorder   . Neuromuscular disorder ([place])   . Osteoarthritis of multiple joints 08/04/2006   S/p R TKR Significant OA continues to be a problem in her left knee.  Lower back OA Complicated by her parkinsonism    . Parkinson's disease ([place])   . Pulmonary HTN ([place])    moderate with PASP 22mmHg on echo 09/2019  . 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 SCOLIOSIS 03/28/2009   Qualifier: Diagnosis of  By: Nori Riis MD, [name]    . Situational depression   . Substance abuse ([place])   . Tinnitus 02/15/2015   Unclear what this is from. Likely multifactorial.   . Type II diabetes mellitus ([place])   . Ulcerative esophagitis   . Urticaria, idiopathic 12/21/2013   One month of urticarial rash that is consistent with hives. She also has a lot of dry skin.   Marland Kitchen UTI (lower urinary tract infection)        Current Medications, Allergies, Past Medical History, Past Surgical History, Family History and Social History were reviewed in [employer] record.   Review of Systems:   Constitutional: Negative for fever, sweats, chills or weight loss.  Respiratory: Negative for shortness of breath.   Cardiovascular: Negative for chest pain, palpitations and leg swelling.  Gastrointestinal: See HPI.  Musculoskeletal: Negative for back pain or muscle aches.  Neurological: Negative for dizziness, headaches or paresthesias.    Physical Exam: There were no vitals taken for this visit. General: Well developed, w   ***female in no acute distress. Head: Normocephalic and atraumatic. Eyes: No scleral icterus. Conjunctiva pink . Ears: Normal auditory acuity. Mouth: Dentition intact. No ulcers or lesions.  Lungs: Clear throughout to auscultation. Heart: Regular rate and rhythm, no murmur. Abdomen: Soft, nontender and nondistended. No masses or hepatomegaly. Normal bowel sounds x 4 quadrants.  Rectal: *** Musculoskeletal: Symmetrical with no gross deformities. Extremities: No edema. Neurological: Alert oriented x 4. No focal deficits.  Psychological: Alert and cooperative. Normal mood and affect  Assessment and Recommendations: ***

## 2020-09-07 ENCOUNTER — Ambulatory Visit: Payer: Medicare PPO | Admitting: Nurse Practitioner

## 2020-09-08 ENCOUNTER — Encounter: Payer: Self-pay | Admitting: Family Medicine

## 2020-09-08 ENCOUNTER — Other Ambulatory Visit: Payer: Self-pay | Admitting: Family Medicine

## 2020-09-11 MED ORDER — APIXABAN 5 MG PO TABS: 5.0000 mg | ORAL_TABLET | Freq: Two times a day (BID) | ORAL | 0 refills | Status: AC

## 2020-09-12 ENCOUNTER — Other Ambulatory Visit: Payer: Self-pay

## 2020-09-12 ENCOUNTER — Ambulatory Visit (INDEPENDENT_AMBULATORY_CARE_PROVIDER_SITE_OTHER): Payer: Medicare PPO | Admitting: Family Medicine

## 2020-09-12 ENCOUNTER — Telehealth: Payer: Self-pay

## 2020-09-12 ENCOUNTER — Encounter: Payer: Self-pay | Admitting: Family Medicine

## 2020-09-12 DIAGNOSIS — G894 Chronic pain syndrome: Secondary | ICD-10-CM | POA: Diagnosis not present

## 2020-09-12 DIAGNOSIS — N183 Chronic kidney disease, stage 3 unspecified: Secondary | ICD-10-CM | POA: Diagnosis not present

## 2020-09-12 DIAGNOSIS — R112 Nausea with vomiting, unspecified: Secondary | ICD-10-CM

## 2020-09-12 DIAGNOSIS — G3183 Dementia with Lewy bodies: Secondary | ICD-10-CM | POA: Diagnosis not present

## 2020-09-12 DIAGNOSIS — E7800 Pure hypercholesterolemia, unspecified: Secondary | ICD-10-CM | POA: Diagnosis not present

## 2020-09-12 DIAGNOSIS — E1122 Type 2 diabetes mellitus with diabetic chronic kidney disease: Secondary | ICD-10-CM | POA: Diagnosis not present

## 2020-09-12 DIAGNOSIS — I4819 Other persistent atrial fibrillation: Secondary | ICD-10-CM | POA: Diagnosis not present

## 2020-09-12 DIAGNOSIS — M1030 Gout due to renal impairment, unspecified site: Secondary | ICD-10-CM | POA: Diagnosis not present

## 2020-09-12 DIAGNOSIS — I130 Hypertensive heart and chronic kidney disease with heart failure and stage 1 through stage 4 chronic kidney disease, or unspecified chronic kidney disease: Secondary | ICD-10-CM | POA: Diagnosis not present

## 2020-09-12 DIAGNOSIS — N1831 Chronic kidney disease, stage 3a: Secondary | ICD-10-CM | POA: Diagnosis not present

## 2020-09-12 DIAGNOSIS — G20 Parkinson's disease: Secondary | ICD-10-CM

## 2020-09-12 DIAGNOSIS — I4892 Unspecified atrial flutter: Secondary | ICD-10-CM | POA: Diagnosis not present

## 2020-09-12 DIAGNOSIS — I480 Paroxysmal atrial fibrillation: Secondary | ICD-10-CM | POA: Diagnosis not present

## 2020-09-12 DIAGNOSIS — I509 Heart failure, unspecified: Secondary | ICD-10-CM | POA: Diagnosis not present

## 2020-09-12 DIAGNOSIS — F0281 Dementia in other diseases classified elsewhere with behavioral disturbance: Secondary | ICD-10-CM | POA: Diagnosis not present

## 2020-09-12 DIAGNOSIS — E119 Type 2 diabetes mellitus without complications: Secondary | ICD-10-CM

## 2020-09-12 NOTE — Patient Instructions (Signed)
Great to see you!   

## 2020-09-12 NOTE — Telephone Encounter (Signed)
Received phone call from [place], [place] at [facility] regarding patient's weight. PT reports that patient has gained five pounds in the last week. Patient weighed 162 lbs last week and 167 today. PT does not report any increased swelling or SHOB.   Patient was seen in office today for follow up.   Please advise any new orders or additional recommendations.   Talbot Grumbling, RN

## 2020-09-13 DIAGNOSIS — I130 Hypertensive heart and chronic kidney disease with heart failure and stage 1 through stage 4 chronic kidney disease, or unspecified chronic kidney disease: Secondary | ICD-10-CM | POA: Diagnosis not present

## 2020-09-13 DIAGNOSIS — G3183 Dementia with Lewy bodies: Secondary | ICD-10-CM | POA: Diagnosis not present

## 2020-09-13 DIAGNOSIS — F0281 Dementia in other diseases classified elsewhere with behavioral disturbance: Secondary | ICD-10-CM | POA: Diagnosis not present

## 2020-09-13 DIAGNOSIS — E1122 Type 2 diabetes mellitus with diabetic chronic kidney disease: Secondary | ICD-10-CM | POA: Diagnosis not present

## 2020-09-13 DIAGNOSIS — M1030 Gout due to renal impairment, unspecified site: Secondary | ICD-10-CM | POA: Diagnosis not present

## 2020-09-13 DIAGNOSIS — I480 Paroxysmal atrial fibrillation: Secondary | ICD-10-CM | POA: Diagnosis not present

## 2020-09-13 DIAGNOSIS — N1831 Chronic kidney disease, stage 3a: Secondary | ICD-10-CM | POA: Diagnosis not present

## 2020-09-13 DIAGNOSIS — I4892 Unspecified atrial flutter: Secondary | ICD-10-CM | POA: Diagnosis not present

## 2020-09-13 DIAGNOSIS — I509 Heart failure, unspecified: Secondary | ICD-10-CM | POA: Diagnosis not present

## 2020-09-13 MED ORDER — BENZTROPINE MESYLATE 0.5 MG PO TABS: 0.5000 mg | ORAL_TABLET | Freq: Two times a day (BID) | ORAL | 0 refills | Status: AC

## 2020-09-13 MED ORDER — METOPROLOL TARTRATE 25 MG PO TABS: 12.5000 mg | ORAL_TABLET | Freq: Two times a day (BID) | ORAL | 0 refills | Status: AC

## 2020-09-13 MED ORDER — ONDANSETRON HCL 4 MG PO TABS: 4.0000 mg | ORAL_TABLET | Freq: Three times a day (TID) | ORAL | 0 refills | Status: AC | PRN

## 2020-09-13 MED ORDER — DONEPEZIL HCL 5 MG PO TABS: 5.0000 mg | ORAL_TABLET | Freq: Every day | ORAL | 0 refills | Status: DC

## 2020-09-13 MED ORDER — PANTOPRAZOLE SODIUM 40 MG PO TBEC: 40.0000 mg | DELAYED_RELEASE_TABLET | Freq: Two times a day (BID) | ORAL | 0 refills | Status: DC

## 2020-09-13 MED ORDER — OLANZAPINE 2.5 MG PO TABS: 2.5000 mg | ORAL_TABLET | Freq: Every day | ORAL | 0 refills | Status: DC

## 2020-09-13 MED ORDER — SERTRALINE HCL 100 MG PO TABS: 100.0000 mg | ORAL_TABLET | Freq: Every day | ORAL | 0 refills | Status: AC

## 2020-09-13 MED ORDER — GABAPENTIN 300 MG PO CAPS: ORAL_CAPSULE | ORAL | 3 refills | Status: AC

## 2020-09-13 MED ORDER — TRAMADOL HCL 50 MG PO TABS: ORAL_TABLET | ORAL | 1 refills | Status: AC

## 2020-09-13 MED ORDER — LOPERAMIDE HCL 2 MG PO TABS: 2.0000 mg | ORAL_TABLET | Freq: Four times a day (QID) | ORAL | 1 refills | Status: DC | PRN

## 2020-09-13 MED ORDER — PRAVASTATIN SODIUM 20 MG PO TABS: 20.0000 mg | ORAL_TABLET | Freq: Every day | ORAL | 0 refills | Status: AC

## 2020-09-13 NOTE — Assessment & Plan Note (Signed)
Both her gabapentin and tramadol dose were decreased throughout inpatient and I agree with decrease.  She seems to be doing pretty well right now.  I would like to keep it at current dose or less.

## 2020-09-13 NOTE — Progress Notes (Signed)
CHIEF COMPLAINT / HPI:  She is here with her daughter and her husband.  Recently discharged from inpatient psychiatric facility in [place].  She reports feeling better compared to prior to her hospitalization.  Her husband and tells me that the biggest issue agree that her mood has been much more stable.  They made some significant medication changes and bring both her medications and the med list with them.  Ms. Skeet has no complaints today.  She is a little concerned that she is not on her carbidopa for her Parkinson's.  She is eating fairly well.  There is been no outburst at home.  She is using her walker most of the time.  Her husband is trying to keep her active at home and not let her spend long periods of time in bed.  Her daughter tells me that the biggest issue right now is Ms. Montecalvo is having episodic urgent need to stool.  The stools are loose.  They had recommended she start on loperamide on a regular schedule and she is only been taking it intermittently.  They actually ran out of it day before yesterday.  It seems to help when she was taking it regularly.  PERTINENT  PMH / PSH: I have reviewed the patient's medications, allergies, past medical and surgical history, smoking status and updated in the EMR as appropriate.   OBJECTIVE:  BP 128/64   Pulse (!) 58   Ht _0  (1.6 m)   Wt 169 lb (76.7 kg)   SpO2 95%   BMI 29.94 kg/m  GENERAL: Well-developed elderly female, no acute distress, slightly more frail than I have seen her previously. GAIT: Using a rolling walker.  Has great speed.  Has a hunched gait with a relatively shortened stride length but can really move down the hall at a pretty good clip.  She rises from a chair with the assistance of the walker and is a little bit unsteady initially. CV: Regular rate and rhythm LUNGS: Clear to auscultation bilaterally ABDOMEN: Soft, positive bowel sounds NEURO: Resting tremor noted in the hands and a little bit in the  oral area. PSYCH: Alert and oriented x4.  She recognizes me.  She answers questions appropriately and asked some questions of her own.  Her speech is sometimes halting and she has some word finding difficulty and occasionally her sentence structure is a little abnormal but she gets the point across most of the time.  Her mood seems very upbeat.  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  She seems to be doing fairly well from a mobility standpoint without it.  I will let her neurologist handle whether or not he wants to restart it.  At the inpatient facility, the focus was on mood stabilization and that seems to be much better.  I agree with continuing the current medication.  She is planning to enroll in the PACE program.  I will be happy to continue and what ever role they need me whether that be PCP or adjunct.  For now we will refill her medicines and plan on seeing her back in 2 months.  The family does a really great job of keeping me updated via [place].  Diabetes mellitus type 2, diet-controlled ([place]) Reviewed her labs from recent hospitalization.  She is not on any medications for diabetes and it seems relatively well controlled at this time.  We will continue to  follow. On Sep 02, 2020 her A1c was 5.3.  CKD stage 3 due to type 2 diabetes mellitus (HCC)   Ref Range & Units 3 wk ago Comments  Na 136 - 146 mmol/L 142    Potassium 3.7 - 5.4 mmol/L 4.2    Cl 97 - 108 mmol/L 106    CO2 20 - 32 mmol/L 24    AGAP 7 - 16 mmol/L 12    Glucose 65 - 99 mg/dL 91    BUN 8 - 27 mg/dL 13    Creatinine 0.57 - 1.00 mg/dL 1.08High    Ca 8.6 - 10.2 mg/dL 9.3    BUN/CREAT RATIO 11.0 - 26.0 12.0    eGFR mL/min/1.45m 54     Reviewed her labs.  Kidney function appears stable.  We will continue to follow every 3 to 6 months.  Hyperlipidemia Continue pravastatin.  Chronic pain syndrome Both her  gabapentin and tramadol dose were decreased throughout inpatient and I agree with decrease.  She seems to be doing pretty well right now.  I would like to keep it at current dose or less.  Atrial fibrillation ([place] She is rate controlled.  Continue chronic anticoagulation and metoprolol for rate control.   SDorcas McmurrayMD

## 2020-09-13 NOTE — Assessment & Plan Note (Signed)
Continue pravastatin 

## 2020-09-13 NOTE — Assessment & Plan Note (Signed)
   Ref Range & Units 3 wk ago Comments  Na 136 - 146 mmol/L 142    Potassium 3.7 - 5.4 mmol/L 4.2    Cl 97 - 108 mmol/L 106    CO2 20 - 32 mmol/L 24    AGAP 7 - 16 mmol/L 12    Glucose 65 - 99 mg/dL 91    BUN 8 - 27 mg/dL 13    Creatinine 0.57 - 1.00 mg/dL 1.08High    Ca 8.6 - 10.2 mg/dL 9.3    BUN/CREAT RATIO 11.0 - 26.0 12.0    eGFR mL/min/1.82m2 54     Reviewed her labs.  Kidney function appears stable.  We will continue to follow every 3 to 6 months.

## 2020-09-13 NOTE — Assessment & Plan Note (Addendum)
Her carbidopa/levodopa was stopped at the psychiatric hospital.  She seems to be doing fairly well from a mobility standpoint without it.  I will let her neurologist handle whether or not he wants to restart it.  At the inpatient facility, the focus was on mood stabilization and that seems to be much better.  I agree with continuing the current medication.  She is planning to enroll in the PACE program.  I will be happy to continue and what ever role they need me whether that be PCP or adjunct.  For now we will refill her medicines and plan on seeing her back in 2 months.  The family does a really great job of keeping me updated via [place].

## 2020-09-13 NOTE — Assessment & Plan Note (Addendum)
Reviewed her labs from recent hospitalization.  She is not on any medications for diabetes and it seems relatively well controlled at this time.  We will continue to follow. On Sep 02, 2020 her A1c was 5.3.

## 2020-09-13 NOTE — Telephone Encounter (Signed)
No new orders or changes needed. THANKS! [name]

## 2020-09-13 NOTE — Assessment & Plan Note (Signed)
She is rate controlled.  Continue chronic anticoagulation and metoprolol for rate control.

## 2020-09-15 ENCOUNTER — Other Ambulatory Visit: Payer: Self-pay | Admitting: Family Medicine

## 2020-09-17 DIAGNOSIS — I480 Paroxysmal atrial fibrillation: Secondary | ICD-10-CM | POA: Diagnosis not present

## 2020-09-17 DIAGNOSIS — F0281 Dementia in other diseases classified elsewhere with behavioral disturbance: Secondary | ICD-10-CM | POA: Diagnosis not present

## 2020-09-17 DIAGNOSIS — M1030 Gout due to renal impairment, unspecified site: Secondary | ICD-10-CM | POA: Diagnosis not present

## 2020-09-17 DIAGNOSIS — N1831 Chronic kidney disease, stage 3a: Secondary | ICD-10-CM | POA: Diagnosis not present

## 2020-09-17 DIAGNOSIS — I130 Hypertensive heart and chronic kidney disease with heart failure and stage 1 through stage 4 chronic kidney disease, or unspecified chronic kidney disease: Secondary | ICD-10-CM | POA: Diagnosis not present

## 2020-09-17 DIAGNOSIS — G3183 Dementia with Lewy bodies: Secondary | ICD-10-CM | POA: Diagnosis not present

## 2020-09-17 DIAGNOSIS — I4892 Unspecified atrial flutter: Secondary | ICD-10-CM | POA: Diagnosis not present

## 2020-09-17 DIAGNOSIS — E1122 Type 2 diabetes mellitus with diabetic chronic kidney disease: Secondary | ICD-10-CM | POA: Diagnosis not present

## 2020-09-17 DIAGNOSIS — I509 Heart failure, unspecified: Secondary | ICD-10-CM | POA: Diagnosis not present

## 2020-09-18 ENCOUNTER — Encounter: Payer: Self-pay | Admitting: Family Medicine

## 2020-09-18 DIAGNOSIS — G3183 Dementia with Lewy bodies: Secondary | ICD-10-CM | POA: Diagnosis not present

## 2020-09-18 DIAGNOSIS — I130 Hypertensive heart and chronic kidney disease with heart failure and stage 1 through stage 4 chronic kidney disease, or unspecified chronic kidney disease: Secondary | ICD-10-CM | POA: Diagnosis not present

## 2020-09-18 DIAGNOSIS — I4892 Unspecified atrial flutter: Secondary | ICD-10-CM | POA: Diagnosis not present

## 2020-09-18 DIAGNOSIS — N1831 Chronic kidney disease, stage 3a: Secondary | ICD-10-CM | POA: Diagnosis not present

## 2020-09-18 DIAGNOSIS — F0281 Dementia in other diseases classified elsewhere with behavioral disturbance: Secondary | ICD-10-CM | POA: Diagnosis not present

## 2020-09-18 DIAGNOSIS — E1122 Type 2 diabetes mellitus with diabetic chronic kidney disease: Secondary | ICD-10-CM | POA: Diagnosis not present

## 2020-09-18 DIAGNOSIS — I509 Heart failure, unspecified: Secondary | ICD-10-CM | POA: Diagnosis not present

## 2020-09-18 DIAGNOSIS — I480 Paroxysmal atrial fibrillation: Secondary | ICD-10-CM | POA: Diagnosis not present

## 2020-09-18 DIAGNOSIS — M1030 Gout due to renal impairment, unspecified site: Secondary | ICD-10-CM | POA: Diagnosis not present

## 2020-09-20 ENCOUNTER — Telehealth: Payer: Self-pay

## 2020-09-20 NOTE — Telephone Encounter (Signed)
Received mychart message from daughter regarding PA requirement on olanzapine. PA submitted via [employer]. Please see the determination below.     Talbot Grumbling, RN

## 2020-09-25 ENCOUNTER — Telehealth: Payer: Self-pay

## 2020-09-25 DIAGNOSIS — N1831 Chronic kidney disease, stage 3a: Secondary | ICD-10-CM | POA: Diagnosis not present

## 2020-09-25 DIAGNOSIS — G3183 Dementia with Lewy bodies: Secondary | ICD-10-CM | POA: Diagnosis not present

## 2020-09-25 DIAGNOSIS — E1122 Type 2 diabetes mellitus with diabetic chronic kidney disease: Secondary | ICD-10-CM | POA: Diagnosis not present

## 2020-09-25 DIAGNOSIS — F0281 Dementia in other diseases classified elsewhere with behavioral disturbance: Secondary | ICD-10-CM | POA: Diagnosis not present

## 2020-09-25 DIAGNOSIS — M1030 Gout due to renal impairment, unspecified site: Secondary | ICD-10-CM | POA: Diagnosis not present

## 2020-09-25 DIAGNOSIS — I130 Hypertensive heart and chronic kidney disease with heart failure and stage 1 through stage 4 chronic kidney disease, or unspecified chronic kidney disease: Secondary | ICD-10-CM | POA: Diagnosis not present

## 2020-09-25 DIAGNOSIS — I480 Paroxysmal atrial fibrillation: Secondary | ICD-10-CM | POA: Diagnosis not present

## 2020-09-25 DIAGNOSIS — I509 Heart failure, unspecified: Secondary | ICD-10-CM | POA: Diagnosis not present

## 2020-09-25 DIAGNOSIS — I4892 Unspecified atrial flutter: Secondary | ICD-10-CM | POA: Diagnosis not present

## 2020-09-25 NOTE — Telephone Encounter (Signed)
[place] RN calls nurse line reporting 10lb weight gain in one week. Glenard Haring reports weight today of 174lb after lunch. Patient was ~164lb last Monday. Glenard Haring reports swelling in both lower extremities, "more so than usual." [place] denied SOB or chest pain. Patient did complain of a cough. Glenard Haring would like a verbal to go out and see her on Friday. Glenard Haring would like to see if lasix would be beneficial at this time for patient. Will forward to PCP.

## 2020-09-27 NOTE — Telephone Encounter (Signed)
LVM on [place] phone informing of PCP recommendation. [name] Dame to call me tomorrow when she goes to see patient.

## 2020-09-29 DIAGNOSIS — E1122 Type 2 diabetes mellitus with diabetic chronic kidney disease: Secondary | ICD-10-CM | POA: Diagnosis not present

## 2020-09-29 DIAGNOSIS — I509 Heart failure, unspecified: Secondary | ICD-10-CM | POA: Diagnosis not present

## 2020-09-29 DIAGNOSIS — I130 Hypertensive heart and chronic kidney disease with heart failure and stage 1 through stage 4 chronic kidney disease, or unspecified chronic kidney disease: Secondary | ICD-10-CM | POA: Diagnosis not present

## 2020-09-29 DIAGNOSIS — I480 Paroxysmal atrial fibrillation: Secondary | ICD-10-CM | POA: Diagnosis not present

## 2020-09-29 DIAGNOSIS — I4892 Unspecified atrial flutter: Secondary | ICD-10-CM | POA: Diagnosis not present

## 2020-09-29 DIAGNOSIS — G3183 Dementia with Lewy bodies: Secondary | ICD-10-CM | POA: Diagnosis not present

## 2020-09-29 DIAGNOSIS — M1030 Gout due to renal impairment, unspecified site: Secondary | ICD-10-CM | POA: Diagnosis not present

## 2020-09-29 DIAGNOSIS — N1831 Chronic kidney disease, stage 3a: Secondary | ICD-10-CM | POA: Diagnosis not present

## 2020-09-29 DIAGNOSIS — F0281 Dementia in other diseases classified elsewhere with behavioral disturbance: Secondary | ICD-10-CM | POA: Diagnosis not present

## 2020-10-04 ENCOUNTER — Other Ambulatory Visit (HOSPITAL_COMMUNITY): Payer: Medicare PPO

## 2020-10-09 ENCOUNTER — Ambulatory Visit: Payer: Medicare PPO | Admitting: Nurse Practitioner

## 2020-10-19 ENCOUNTER — Other Ambulatory Visit (HOSPITAL_COMMUNITY): Payer: Medicare PPO

## 2020-10-22 ENCOUNTER — Other Ambulatory Visit: Payer: Self-pay

## 2020-10-22 ENCOUNTER — Encounter: Payer: Self-pay | Admitting: Diagnostic Neuroimaging

## 2020-10-22 ENCOUNTER — Ambulatory Visit (INDEPENDENT_AMBULATORY_CARE_PROVIDER_SITE_OTHER): Payer: Medicare (Managed Care) | Admitting: Diagnostic Neuroimaging

## 2020-10-22 VITALS — BP 124/76 | HR 54 | Ht 63.0 in | Wt 164.0 lb

## 2020-10-22 DIAGNOSIS — G20 Parkinson's disease: Secondary | ICD-10-CM | POA: Diagnosis not present

## 2020-10-22 DIAGNOSIS — F0280 Dementia in other diseases classified elsewhere without behavioral disturbance: Secondary | ICD-10-CM

## 2020-10-22 NOTE — Progress Notes (Signed)
[facility]  PATIENT: [name] DOB: [birth year]  REFERRING CLINICIAN: [name], MD  HISTORY FROM: patient and husband REASON FOR VISIT: follow up   HISTORICAL  CHIEF COMPLAINT:  Chief Complaint  Patient presents with   Follow-up    Rm 6 with daughter- Last visit was was in 2020. Pt is currently enrolled in the pace program and is under the care of Dr. Barney Drain and sts prescriptions for meds are through him now. Daughter reports back in April of this year carb/levo was d/c and cogentin 0.5 mg 1 tab bid was started by [facility]. Reports this change has been beneficial, Pt was having a hard time remembering her carb/levo dosage. Sts some decline has been noted since her last visit with P/D.    HISTORY OF PRESENT ILLNESS:   UPDATE (10/22/20, VRP): Since last visit, more progression of PD and dementia. Was confused and admitted to [facility] behavioral hosp in April 2022. Meds adjusted. Now back home and with PACE program.   UPDATE (02/07/19, VRP): Since last visit, doing poorly. More confusion, anxiety, memory lapses. Symptoms are progressive. Severity is moderate. No alleviating or aggravating factors. Tolerating meds.    UPDATE (02/01/18, VRP): Since last visit, doing well. Symptoms are improving. Tolerating carb/levo. Dx'd with severe ulcerative reflux esophagitis. No alleviating or aggravating factors. Tolerating carb/levo 1 tab 5x per day.    UPDATE (07/27/17, VRP): Since last visit, doing worse with excessive movements, anxiety, falls. Tolerating carb/levo (2 tabs 8am, 2 tabs 6pm, 1 tab at 11pm). Now having more nausea, coffee ground emesis, and occ black stools. Some excessive movements, but not correlated with meds.  UPDATE (01/14/17, VRP): Since last visit, doing fair. More anger and rage issues, esp towards husband. Some nausea with carb/levo. Now on carb/levio 1.5 tabs TID, and better tremor control. No falls.  UPDATE 09/09/16  (VRP): Since last visit, no more falls. Now off any benzos or sleep aids. Using walker at times, but mainly walking stick. [facility] care mgmt has reached out to help patient. Now on carb/levo 1 / 1 / 0.5. Continues with nausea.   UPDATE 05/05/16 (MM): "She returns today after experiencing increased falls in the last 2 months. She states that some of the falls have been severe and she has fallen on her face. Fortunately she has not suffered any significant injuries. She does state that her left shoulder and neck is slightly tight after one of the falls. She is not using an assistive device. She states that she did purchase a walker last week. Her husband states that she tends to drag her feet when ambulating. Reports that she had physical therapy over a year ago. She is currently taking Sinemet 25-100 milligrams one and a half tablets 3 times a day. She takes her first dose at 8:30, second dose at 3 PM and last dose at 9:30 PM. She states that she can tell a difference if she misses a dose. Denies any changes in her sleep. She states that if she does not chew her food up well she will become choked. Denies getting choked on liquids. Reports that she does have a tremor in the hands and mouth. She does note that she shuffles her feet when ambulating. She returns today for an evaluation."  UPDATE 03/12/16: Since last visit, continues to have falls; ~ 5 x in last 6 months. Not using cane or walker. Had episode of believing that she went to a party in [place],  [place] (which she did not) and then told her family about it. No further delusions or hallucinations. Has been on lower carb/levo (1 tab twice a day) lately due to nausea.  UPDATE 09/03/15: Since last visit, stable. Did well with PT. Doing well with lower dose of carb/levo.   UPDATE 02/13/15: Since last visit, continues to have tremor, tongue and mouth movments (better), sweating, anxiety, inverted sleep schedule.   UPDATE 10/09/14: Since last visit, had increased  carb/levo up to 2.5 tabs TID (even though I had recommended going up to 2 tabs TID; she's not sure why she did this). More tongue and mouth movements. More falls. Went to hospital for UTI, AKI, N/V/diarrhea In March 2016.   UPDATE 04/04/14: Since last visit, she reports that she is stable from PD standpoint. Tremor and gait are stable. Tolerating carb/levo 1.5 tabs TID. Hasn't tried going up to 2 tabs TID. Re: swallowing issues, she reports remote esophagus surgery for swallow issues 15 yrs ago. Feels that this is a problem again, and wants to see GI. Re: rash, it turns out that they had bedbugs in their home; now problem is resolved. Struggling with diarrhea and excess sweating.  UPDATE 12/26/13: Since last visit, was hospitalized (2 weeks ago) for delirium, hallucinations, chest pain, UTI, and medication overuse. Was taking ambien and a friend's xanax to help her sleep (was struggling with anxiety and insomnia; not seen Dr. Casimiro Needle in a while). Now back at home and doing better. Tremor and gait are worsening. Some wearing off (early AM and late evening). Also with 2 weeks are night time itching and rash. Going to see dermatology soon.   UPDATE 01/18/13: Since last visit, now on carb/levo 1.5 tabs TID. Nausea is improved since she started taking her metformin at different time than carb/levo. Tremor, balance, coordination are stable.  UPDATE 06/01/12: Since last visit, tried carb/levo 1.5 tabs TID x 1 week, then stopped. Felt like it was too much medicine. Did not have increased side effects. Has been struggling with nausea throughout. Tremor is persistent.  UPDATE 01/28/12: Doing well. No wearing off. Tolerating meds. Stopped azilect (per PCP for nausea). Now on fluoxetine for depression.  UPDATE 08/27/10: Doing better on carb/levo.  Taking 1 tab TID (6am, 4pm, 9pm).  Minimal nausea.  Wakes up with more tremor in AM, then reduction in tremor 33min after dose.  Effect wears off around 4pm.  Daughter reports  some intermittent confusion.  PRIOR HPI (06/26/10): 75 yo Caucasian female referred to [place] for tremor with concern for possible Parkinson's disease. Ms. Matas notes she first noticed a resting tremor in her left hand about 3 years ago, and this has gotten progressively worse since then. It has also spread to now involve her mouth and right hand as well, though she notes it is still worst in her left hand. Stress and anxiety can accentuate the tremors, whereas active use can reduce them. She also notes the tremors being worse in the morning. She denies noting anything else that seems to make the tremors better or worse. She admits to what seems like possible bradykinesia, in her words that she has "a hard time getting going sometimes," but denies any freezing. She does note some new difficulty with writing, but denies micrographia. She also admits to feeling like her balance and walking is "a bit off," but she relates this more to the osteoarthritis in her knees and having had surgery on her R knee. She denies orthostatic symptoms, hallucinations or delusions,  difficulty standing or sitting, weakness, new visual changes (aside from her macular degeneration), or feelings of rigidity. She also denies bizarre dreams, nightmares, RLS symptoms, or REM behavior disorder symptoms. She admits to having two uncles who have tremors, etiology unclear, but adds that one uncle had alcoholism (and it was believed this was the cause). She is concerned about what is causing her tremors, and admits that although she initially "put off" being evaluated further, she is anxious to know what might be the cause of her tremors.   REVIEW OF SYSTEMS: Full 14 system review of systems performed and negative except for: as per HPI.   ALLERGIES: Allergies  Allergen Reactions   Ace Inhibitors Anaphylaxis and Swelling   Azilect [Rasagiline Mesylate] Other (See Comments)    hypotension   Lisinopril Swelling    Severe facial  angioedema requiring hospitalization 2007 (approx)    HOME MEDICATIONS: Outpatient Medications Prior to Visit  Medication Sig Dispense Refill   apixaban (ELIQUIS) 5 MG TABS tablet Take 1 tablet (5 mg total) by mouth 2 (two) times daily. 120 tablet 0   benztropine (COGENTIN) 0.5 MG tablet Take 1 tablet (0.5 mg total) by mouth 2 (two) times daily. 180 tablet 0   donepezil (ARICEPT) 5 MG tablet Take 1 tablet (5 mg total) by mouth at bedtime. 90 tablet 0   gabapentin (NEURONTIN) 300 MG capsule Take 3 capsules at bedtime for neuropathic pain 90 capsule 3   memantine (NAMENDA) 5 MG tablet Take 5 mg by mouth daily. Reports she will start this med tonight     metoprolol tartrate (LOPRESSOR) 25 MG tablet Take 0.5 tablets (12.5 mg total) by mouth 2 (two) times daily. 90 tablet 0   ondansetron (ZOFRAN) 4 MG tablet Take 1 tablet (4 mg total) by mouth every 8 (eight) hours as needed for nausea or vomiting. 21 tablet 0   sertraline (ZOLOFT) 100 MG tablet Take 1 tablet (100 mg total) by mouth daily. 90 tablet 0   traMADol (ULTRAM) 50 MG tablet Take by mouth one half tablet bid prn 30 tablet 1   pravastatin (PRAVACHOL) 20 MG tablet Take 1 tablet (20 mg total) by mouth daily. 90 tablet 0   loperamide (IMODIUM A-D) 2 MG tablet Take 1 tablet (2 mg total) by mouth 4 (four) times daily as needed for diarrhea or loose stools. (Patient not taking: Reported on 10/22/2020) 120 tablet 1   OLANZapine (ZYPREXA) 2.5 MG tablet Take 1 tablet (2.5 mg total) by mouth daily. (Patient not taking: Reported on 10/22/2020) 90 tablet 0   pantoprazole (PROTONIX) 40 MG tablet Take 1 tablet (40 mg total) by mouth 2 (two) times daily. 60 tablet 0   No facility-administered medications prior to visit.    PAST MEDICAL HISTORY: Past Medical History:  Diagnosis Date   Abdominal pain, chronic, right upper quadrant 09/22/2019   Acute esophagitis    Allergy    Anxiety    Arthritis    "knees, back" (06/29/2014)   ARTHRITIS, BACK  03/28/2009   Qualifier: Diagnosis of  By: Nori Riis MD, Ludwig Lean SYNDROME, LEFT 01/05/2008   Qualifier: Diagnosis of  By: Nori Riis MD, [name]     Cataract    Chronic bronchitis [facility])    "get it q yr"   Chronic mid back pain    Cognitive impairment 8/41/6606   Complication of anesthesia 07/2008   "hard to get me woke up when I had my knee replaced; they said they had to  bring me back"   Dementia [facility])    "I have some; not dx'd by a dr" (08/06/2017)   Diabetes mellitus type 2, diet-controlled ([place]) 08/05/2006        Disorder of bursae and tendons in shoulder region 01/24/2009   Qualifier: Diagnosis of  By: Nori Riis MD, [name]     Esophageal abnormality 08/12/2010   Barium swallow 12/2011  IMPRESSION: Esophageal dysmotility.  No fixed esophageal narrowing or stricture. However, the barium tablet was transiently delayed at the GE junction.  Postsurgical changes at the GE junction. Gastroesophageal reflux is suspected, but could not be confirmed due to the patient's inability to clear her esophagus in the prone position.     Esophageal stricture    GERD (gastroesophageal reflux disease)    Greater trochanteric bursitis of right hip 07/14/2019   Hematemesis 08/04/2017   Hiatal hernia    History of gout 1970's   Hyperlipidemia    Hypertension    Major depressive disorder, recurrent episode ([place]) 06/11/2006   Qualifier: Diagnosis of  By: Beryle Lathe     Movement disorder    Neuromuscular disorder ([place])    Osteoarthritis of multiple joints 08/04/2006   S/p R TKR Significant OA continues to be a problem in her left knee. Lower back OA Complicated by her parkinsonism     Parkinson's disease ([place])    Pulmonary HTN ([place])    moderate with PASP 29mmHg on echo 09/2019   S/P TKR (total knee replacement), RIGHT 06/09/2012   SCOLIOSIS 03/28/2009   Qualifier: Diagnosis of  By: Nori Riis MD, [name]     Situational depression    Substance abuse [facility])    Tinnitus 02/15/2015   Unclear what this is from. Likely  multifactorial.    Type II diabetes mellitus (HCC)    Ulcerative esophagitis    Urticaria, idiopathic 12/21/2013   One month of urticarial rash that is consistent with hives. She also has a lot of dry skin.    UTI (lower urinary tract infection)     PAST SURGICAL HISTORY: Past Surgical History:  Procedure Laterality Date   ABDOMINAL HYSTERECTOMY     BALLOON DILATION N/A 07/12/2020   Procedure: BALLOON DILATION;  Surgeon: Jackquline Denmark, MD;  Location: [facility] ENDOSCOPY;  Service: Endoscopy;  Laterality: N/A;   BIOPSY  07/12/2020   Procedure: BIOPSY;  Surgeon: Jackquline Denmark, MD;  Location: [facility];  Service: Endoscopy;;   CHOLECYSTECTOMY OPEN     COLON SURGERY     COLONOSCOPY  07/08/2019   DILATION AND CURETTAGE OF UTERUS     ESOPHAGOGASTRIC FUNDOPLASTY     some type "esoph surgery" per pt   ESOPHAGOGASTRODUODENOSCOPY (EGD) WITH PROPOFOL N/A 08/05/2017   Procedure: ESOPHAGOGASTRODUODENOSCOPY (EGD) WITH PROPOFOL;  Surgeon: Jerene Bears, MD;  Location: [place];  Service: Gastroenterology;  Laterality: N/A;   ESOPHAGOGASTRODUODENOSCOPY (EGD) WITH PROPOFOL N/A 07/12/2020   Procedure: ESOPHAGOGASTRODUODENOSCOPY (EGD) WITH PROPOFOL;  Surgeon: Jackquline Denmark, MD;  Location: [facility] ENDOSCOPY;  Service: Endoscopy;  Laterality: N/A;  with dil   JOINT REPLACEMENT     TOTAL KNEE ARTHROPLASTY Right 07/2008   TUBAL LIGATION      FAMILY HISTORY: Family History  Problem Relation Age of Onset   Heart disease Mother    Diabetes Mother    Heart disease Father    Heart attack Sister    Heart disease Sister    Heart attack Brother    Heart disease Brother    Cerebral palsy Son    Colon cancer Neg Hx  Esophageal cancer Neg Hx    Liver cancer Neg Hx    Pancreatic cancer Neg Hx    Stomach cancer Neg Hx    Rectal cancer Neg Hx     SOCIAL HISTORY:  Social History   Socioeconomic History   Marital status: Married    Spouse name: [name]   Number of children: 2   Years of education: 14    Highest education level: Not on file  Occupational History   Occupation: Retired    Fish farm manager: NOT EMPLOYED  Tobacco Use   Smoking status: Never   Smokeless tobacco: Never  Vaping Use   Vaping Use: Never used  Substance and Sexual Activity   Alcohol use: Yes    Comment: Occasional   Drug use: No   Sexual activity: Not on file  Other Topics Concern   Not on file  Social History Narrative   Health Care POA:    Emergency Contact: daughter, [name], [phone] husband, [name], [place]:   Who lives with you: Lives with husband 02/07/19   Any pets: Rabbit, [name]   Diet: Patient has a varied diet but struggles with what to eat with hypertension, diabetes, parkinsons   Exercise: Patient does not have any regular exercise routine.   Seatbelts: Patient reports wearing her seatbelt when in vehicle.   Nancy Fetter Exposure/Protection: Patient reports wearing sun block lotion daily.   Hobbies: Watching game shows, writing poetry, writing in journal, volunteering at day program with son.    Caffeine Use: very little occasional      02/07/19 caregiver 2 hrs 2-3 x weekly   Social Determinants of Health   Financial Resource Strain: Not on file  Food Insecurity: Not on file  Transportation Needs: Not on file  Physical Activity: Not on file  Stress: Not on file  Social Connections: Not on file  Intimate Partner Violence: Not on file    PHYSICAL EXAM  GENERAL EXAM/CONSTITUTIONAL: Vitals:  Vitals:   10/22/20 1549  BP: 124/76  Pulse: (!) 54  SpO2: 92%  Weight: 164 lb (74.4 kg)  Height: 5\' 3"  (1.6 m)   Body mass index is 29.05 kg/m. Wt Readings from Last 3 Encounters:  10/22/20 164 lb (74.4 kg)  09/12/20 169 lb (76.7 kg)  07/29/20 176 lb 5.9 oz (80 kg)   Patient is in no distress; well developed, nourished and groomed; neck is supple  CARDIOVASCULAR: Examination of carotid arteries is normal; no carotid bruits Regular rate and rhythm, no murmurs Examination  of peripheral vascular system by observation and palpation is normal  EYES: Ophthalmoscopic exam of optic discs and posterior segments is normal; no papilledema or hemorrhages No results found.  MUSCULOSKELETAL: Gait, strength, tone, movements noted in Neurologic exam below  NEUROLOGIC: MENTAL STATUS:  MMSE - Mini Mental State Exam 12/11/2013 10/02/2010  Orientation to time 1 5  Orientation to Place 5 5  Registration 3 3  Attention/ Calculation 1 5  Recall 0 2  Language- name 2 objects 2 2  Language- repeat 1 1  Language- follow 3 step command 3 3  Language- read & follow direction 1 1  Write a sentence 1 1  Copy design 0 0  Total score 18 28   awake, alert, oriented to person [place]:  2nd - no papilledema on fundoscopic exam 2nd, 3rd, 4th, 6th - pupils equal and reactive to light, visual fields full to confrontation, extraocular muscles  intact, no nystagmus; LEFT PTOSIS 5th - facial sensation symmetric 7th - facial strength symmetric 8th - hearing intact 9th - palate elevates symmetrically, uvula midline 11th - shoulder shrug symmetric 12th - tongue protrusion midline  MOTOR:  COGWHEELING RIGIDITY IN BUE; RESTING TREMOR IN LUE > RUE MODERATE BRADYKINESIA IN BUE AND BLE  SENSORY:  normal and symmetric to light touch  COORDINATION:  finger-nose-finger, fine finger movements SLOW  REFLEXES:  deep tendon reflexes TRACE  and symmetric  GAIT/STATION:  IN WHEELCHAIR    DIAGNOSTIC DATA (LABS, IMAGING, TESTING) - I reviewed patient records, labs, notes, testing and imaging myself where available.  Lab Results  Component Value Date   WBC 8.7 08/09/2020   HGB 13.6 08/09/2020   HCT 41.4 08/09/2020   MCV 96.5 08/09/2020   PLT 302 08/09/2020      Component Value Date/Time   NA 141 08/09/2020 0050   NA 143 04/20/2019 1229   K 3.2 (L) 08/09/2020 0050   CL 105 08/09/2020 0050   CO2 25 08/09/2020  0050   GLUCOSE 128 (H) 08/09/2020 0050   BUN 13 08/09/2020 0050   BUN 21 04/20/2019 1229   CREATININE 1.26 (H) 08/09/2020 0050   CREATININE 1.07 (H) 02/27/2016 1029   CALCIUM 9.5 08/09/2020 0050   PROT 6.9 08/09/2020 0050   PROT 7.2 04/20/2019 1229   ALBUMIN 4.0 08/09/2020 0050   ALBUMIN 4.7 04/20/2019 1229   AST 22 08/09/2020 0050   ALT <5 08/09/2020 0050   ALKPHOS 61 08/09/2020 0050   BILITOT 0.8 08/09/2020 0050   BILITOT 0.4 04/20/2019 1229   GFRNONAA 45 (L) 08/09/2020 0050   GFRNONAA 53 (L) 02/27/2016 1029   GFRAA 45 (L) 04/20/2019 1229   GFRAA 61 02/27/2016 1029   Lab Results  Component Value Date   CHOL 244 (H) 06/07/2014   HDL 38.60 (L) 06/07/2014   LDLCALC 123 (H) 12/11/2013   LDLDIRECT 174 (H) 04/20/2019   TRIG 384.0 (H) 06/07/2014   CHOLHDL 6 06/07/2014   Lab Results  Component Value Date   HGBA1C 5.6 07/11/2020   Lab Results  Component Value Date   CVKFMMCR75 436 07/27/2020   Lab Results  Component Value Date   TSH 1.252 07/27/2020    07/05/10 MRI BRAIN  - minimal scattered chronic small vessel ischemic disease  07/10/20 CT head  - Chronic changes without acute abnormality.   ASSESSMENT AND PLAN  75 y.o. female with resting tremor of BUE, cogwheel rigidity, bradykinesia, decreased arm swing, anxiety, diff swallowing, memory loss; consistent with idiopathic parkinson's disease.    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    Penni Bombard, MD 0/67/7034, 0:35 PM Certified in Neurology, Neurophysiology and Neuroimaging  [facility] [address] [place], [place] [address] [phone]

## 2020-10-23 ENCOUNTER — Telehealth: Payer: Self-pay

## 2020-10-23 NOTE — Telephone Encounter (Signed)
[facility] faxed over a request for Medical records from pt's 10/22/20 visit with Dr. Leta Baptist.   Note printed and faxed to # [phone], confirmation received.

## 2020-11-02 ENCOUNTER — Ambulatory Visit: Payer: Medicare PPO | Admitting: Physician Assistant

## 2020-11-09 ENCOUNTER — Other Ambulatory Visit (HOSPITAL_COMMUNITY): Payer: Medicare (Managed Care)

## 2020-11-20 ENCOUNTER — Encounter: Payer: Self-pay | Admitting: Family Medicine

## 2020-11-28 ENCOUNTER — Encounter (HOSPITAL_COMMUNITY): Payer: Self-pay

## 2020-11-28 ENCOUNTER — Ambulatory Visit (HOSPITAL_COMMUNITY): Payer: Medicare (Managed Care)

## 2020-12-12 ENCOUNTER — Other Ambulatory Visit: Payer: Self-pay

## 2020-12-12 ENCOUNTER — Emergency Department (HOSPITAL_COMMUNITY)
Admission: EM | Admit: 2020-12-12 | Discharge: 2020-12-13 | Disposition: A | Discharge status: Home / Self Care | Payer: Medicare (Managed Care) | Attending: Emergency Medicine | Admitting: Emergency Medicine

## 2020-12-12 DIAGNOSIS — M546 Pain in thoracic spine: Secondary | ICD-10-CM | POA: Insufficient documentation

## 2020-12-12 DIAGNOSIS — Z79899 Other long term (current) drug therapy: Secondary | ICD-10-CM | POA: Diagnosis not present

## 2020-12-12 DIAGNOSIS — I129 Hypertensive chronic kidney disease with stage 1 through stage 4 chronic kidney disease, or unspecified chronic kidney disease: Secondary | ICD-10-CM | POA: Insufficient documentation

## 2020-12-12 DIAGNOSIS — E785 Hyperlipidemia, unspecified: Secondary | ICD-10-CM | POA: Insufficient documentation

## 2020-12-12 DIAGNOSIS — Z20822 Contact with and (suspected) exposure to covid-19: Secondary | ICD-10-CM | POA: Diagnosis not present

## 2020-12-12 DIAGNOSIS — Z96651 Presence of right artificial knee joint: Secondary | ICD-10-CM | POA: Insufficient documentation

## 2020-12-12 DIAGNOSIS — I4891 Unspecified atrial fibrillation: Secondary | ICD-10-CM | POA: Diagnosis not present

## 2020-12-12 DIAGNOSIS — N183 Chronic kidney disease, stage 3 unspecified: Secondary | ICD-10-CM | POA: Diagnosis not present

## 2020-12-12 DIAGNOSIS — E1169 Type 2 diabetes mellitus with other specified complication: Secondary | ICD-10-CM | POA: Insufficient documentation

## 2020-12-12 DIAGNOSIS — G20 Parkinson's disease: Secondary | ICD-10-CM | POA: Diagnosis not present

## 2020-12-12 DIAGNOSIS — Z7901 Long term (current) use of anticoagulants: Secondary | ICD-10-CM | POA: Diagnosis not present

## 2020-12-12 DIAGNOSIS — F0390 Unspecified dementia without behavioral disturbance: Secondary | ICD-10-CM | POA: Diagnosis not present

## 2020-12-12 DIAGNOSIS — E1122 Type 2 diabetes mellitus with diabetic chronic kidney disease: Secondary | ICD-10-CM | POA: Diagnosis not present

## 2020-12-12 NOTE — ED Notes (Signed)
ED Provider at bedside on arrival.
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
## 2020-12-13 ENCOUNTER — Emergency Department (HOSPITAL_COMMUNITY): Payer: Medicare (Managed Care)

## 2020-12-13 LAB — CBC
HCT: 36 % (ref 36.0–46.0)
Hemoglobin: 11.7 g/dL — ABNORMAL LOW (ref 12.0–15.0)
MCH: 30.5 pg (ref 26.0–34.0)
MCHC: 32.5 g/dL (ref 30.0–36.0)
MCV: 94 fL (ref 80.0–100.0)
Platelets: 227 10*3/uL (ref 150–400)
RBC: 3.83 MIL/uL — ABNORMAL LOW (ref 3.87–5.11)
RDW: 14.2 % (ref 11.5–15.5)
WBC: 7.2 10*3/uL (ref 4.0–10.5)
nRBC: 0 % (ref 0.0–0.2)

## 2020-12-13 LAB — BASIC METABOLIC PANEL
Anion gap: 11 (ref 5–15)
BUN: 22 mg/dL (ref 8–23)
CO2: 24 mmol/L (ref 22–32)
Calcium: 9.5 mg/dL (ref 8.9–10.3)
Chloride: 106 mmol/L (ref 98–111)
Creatinine, Ser: 1.5 mg/dL — ABNORMAL HIGH (ref 0.44–1.00)
GFR, Estimated: 36 mL/min — ABNORMAL LOW (ref >60)
Glucose, Bld: 112 mg/dL — ABNORMAL HIGH (ref 70–99)
Potassium: 4.1 mmol/L (ref 3.5–5.1)
Sodium: 141 mmol/L (ref 135–145)

## 2020-12-13 LAB — RESP PANEL BY RT-PCR (FLU A&B, COVID) ARPGX2
Influenza A by PCR: NEGATIVE
Influenza B by PCR: NEGATIVE
SARS Coronavirus 2 by RT PCR: NEGATIVE

## 2020-12-13 MED ORDER — ACETAMINOPHEN 500 MG PO TABS
1000.0000 mg | ORAL_TABLET | Freq: Three times a day (TID) | ORAL | Status: DC | PRN
  Administered 2020-12-13: 1000 mg via ORAL
  Filled 2020-12-13: qty 2

## 2020-12-13 MED ORDER — METOPROLOL TARTRATE 25 MG PO TABS
12.5000 mg | ORAL_TABLET | Freq: Two times a day (BID) | ORAL | Status: DC
  Administered 2020-12-13 (×2): 12.5 mg via ORAL
  Filled 2020-12-13 (×2): qty 1

## 2020-12-13 MED ORDER — SERTRALINE HCL 100 MG PO TABS
100.0000 mg | ORAL_TABLET | Freq: Every day | ORAL | Status: DC
  Administered 2020-12-13: 100 mg via ORAL
  Filled 2020-12-13: qty 1

## 2020-12-13 MED ORDER — APIXABAN 5 MG PO TABS
5.0000 mg | ORAL_TABLET | Freq: Two times a day (BID) | ORAL | Status: DC
  Administered 2020-12-13 (×2): 5 mg via ORAL
  Filled 2020-12-13 (×2): qty 1

## 2020-12-13 MED ORDER — DONEPEZIL HCL 5 MG PO TABS
5.0000 mg | ORAL_TABLET | Freq: Every day | ORAL | Status: DC
  Administered 2020-12-13: 5 mg via ORAL
  Filled 2020-12-13 (×2): qty 1

## 2020-12-13 MED ORDER — BENZTROPINE MESYLATE 0.5 MG PO TABS
0.5000 mg | ORAL_TABLET | Freq: Two times a day (BID) | ORAL | Status: DC
  Administered 2020-12-13 (×2): 0.5 mg via ORAL
  Filled 2020-12-13 (×3): qty 1

## 2020-12-13 NOTE — ED Notes (Signed)
Patient transported to X-ray 
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
## 2020-12-13 NOTE — ED Notes (Signed)
Husband Dianara Forsey [phone] would like an update and a call when she's discharged so he can pick her up to go home
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
## 2020-12-13 NOTE — ED Notes (Signed)
Discharge reviewed and explained to pt and [name] who was transporting pt home from [facility].
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
## 2020-12-13 NOTE — ED Notes (Signed)
Husband [name] [phone] would like an update

## 2020-12-26 ENCOUNTER — Encounter: Payer: Self-pay | Admitting: Physician Assistant

## 2020-12-26 ENCOUNTER — Telehealth: Payer: Self-pay | Admitting: *Deleted

## 2020-12-26 ENCOUNTER — Ambulatory Visit (INDEPENDENT_AMBULATORY_CARE_PROVIDER_SITE_OTHER): Payer: Medicare (Managed Care) | Admitting: Physician Assistant

## 2020-12-26 VITALS — BP 132/70 | HR 60 | Ht 64.0 in | Wt 178.5 lb

## 2020-12-26 DIAGNOSIS — K219 Gastro-esophageal reflux disease without esophagitis: Secondary | ICD-10-CM

## 2020-12-26 DIAGNOSIS — R1319 Other dysphagia: Secondary | ICD-10-CM

## 2020-12-26 DIAGNOSIS — F0280 Dementia in other diseases classified elsewhere without behavioral disturbance: Secondary | ICD-10-CM

## 2020-12-26 DIAGNOSIS — G20 Parkinson's disease: Secondary | ICD-10-CM

## 2020-12-26 NOTE — Telephone Encounter (Signed)
   Agueda Winbush 06-Jan-1946 [id number]  Dear Dr. Nori Riis:  We have scheduled the above named patient for a(n) endoscopy with dilation procedure. Our records show that (s)he is on anticoagulation therapy.  Please advise as to whether the patient may come off their therapy of Eliquis 2 days prior to their procedure which is scheduled for 02/06/21.  Please route your response to Caryl Asp, [place] or fax response to [phone].  Sincerely,   Caryl Asp, [facility]

## 2020-12-26 NOTE — Patient Instructions (Signed)
Continue Pantoprazole 40 mg twice daily 30-60 minutes before breakfast and bedtime.   You have been scheduled for an endoscopy. Please follow written instructions given to you at your visit today. If you use inhalers (even only as needed), please bring them with you on the day of your procedure.  You will be contacted by our office prior to your procedure for directions on holding your Eliquis.  If you do not hear from our office 1 week prior to your scheduled procedure, please call [phone] to discuss.    If you are age 32 or older, your body mass index should be between 23-30. Your Body mass index is 30.64 kg/m. If this is out of the aforementioned range listed, please consider follow up with your Primary Care Provider.  If you are age 73 or younger, your body mass index should be between 19-25. Your Body mass index is 30.64 kg/m. If this is out of the aformentioned range listed, please consider follow up with your Primary Care Provider.   __________________________________________________________  The [facility] providers would like to encourage you to use [facility] to communicate with providers for non-urgent requests or questions.  Due to long hold times on the telephone, sending your provider a message by [facility] may be a faster and more efficient way to get a response.  Please allow 48 business hours for a response.  Please remember that this is for non-urgent requests.

## 2020-12-26 NOTE — Progress Notes (Signed)
Chief Complaint: Follow-up dysphagia  HPI:    [name] is a 75 year old Caucasian female, known to Dr. Hilarie Fredrickson, with multiple medical problems as listed below including A. fib on Eliquis, pulmonary hypertension (09/27/2019 echo with an EF 60-65%) dementia, diabetes, GERD and Parkinson's, who presents to clinic today for follow-up of her dysphagia.    07/08/2019 colonoscopy for screening and evaluation of diarrhea with perianal fungal rash, 7 sessile polyps removed from the ascending, transverse, descending and sigmoid colon sizes 3-7 mm, left-sided diverticulosis with associated luminal narrowing.  Pathology showed tubular adenomas without high-grade dysplasia.    07/11/2020 patient consulted in the hospital for dysphagia.  Patient had a barium esophagram on the hospital that showed narrowing of the distal esophagus and a 13 mm tablet never passed despite prolonged observation.    07/12/2020 EGD with Dr. Lyndel Safe with LA grade D reflux esophagitis with distal esophageal stricture which was biopsied and dilated as well as a 2 cm hiatal hernia and mild gastritis.  At that time recommended continuing pantoprazole 40 mg twice daily x12 weeks and Carafate suspension 1 g p.o. 4 times daily x2 weeks with recommendations to repeat EGD if continues with dysphagia at follow-up.    12/13/2020 BMP with a creatinine of 1.5 (slightly higher than patient's baseline).  CBC with a hemoglobin minimally decreased at 11.7    Today, patient is accompanied by family member who assists with history and explains that after having her endoscopy as above she did feel better as far swallowing but she has continued with a degree of things getting stuck in her esophagus and at times has to make herself vomit in order to be able to keep eating.  Has continued on Pantoprazole 40 mg twice daily.  Did finish her course of Carafate but is no longer taking this now.  Associated symptoms include occasional epigastric burning discomfort which is  better after using Pepto-Bismol.  Also occasional coughing when laying in bed.    Denies fever, chills, weight loss, change in bowel habits or symptoms that awaken her from sleep.  Past Medical History:  Diagnosis Date   Abdominal pain, chronic, right upper quadrant 09/22/2019   Acute esophagitis    Allergy    Anxiety    Arthritis    "knees, back" (06/29/2014)   ARTHRITIS, BACK 03/28/2009   Qualifier: Diagnosis of  By: Nori Riis MD, Ludwig Lean SYNDROME, LEFT 01/05/2008   Qualifier: Diagnosis of  By: Nori Riis MD, [name]     Cataract    Chronic bronchitis [facility])    "get it q yr"   Chronic mid back pain    Cognitive impairment [id number]   Complication of anesthesia 07/2008   "hard to get me woke up when I had my knee replaced; they said they had to bring me back"   Dementia [facility])    "I have some; not dx'd by a dr" (08/06/2017)   Diabetes mellitus type 2, diet-controlled ([place]) 08/05/2006        Disorder of bursae and tendons in shoulder region 01/24/2009   Qualifier: Diagnosis of  By: Nori Riis MD, Sara     Esophageal abnormality 08/12/2010   Barium swallow 12/2011  IMPRESSION: Esophageal dysmotility.  No fixed esophageal narrowing or stricture. However, the barium tablet was transiently delayed at the GE junction.  Postsurgical changes at the GE junction. Gastroesophageal reflux is suspected, but could not be confirmed due to the patient's inability to clear her esophagus in the prone position.  Esophageal stricture    GERD (gastroesophageal reflux disease)    Greater trochanteric bursitis of right hip 07/14/2019   Hematemesis 08/04/2017   Hiatal hernia    History of gout 1970's   Hyperlipidemia    Hypertension    Major depressive disorder, recurrent episode ([place]) 06/11/2006   Qualifier: Diagnosis of  By: Beryle Lathe     Movement disorder    Neuromuscular disorder ([place])    Osteoarthritis of multiple joints 08/04/2006   S/p R TKR Significant OA continues to be a problem in her left knee.  Lower back OA Complicated by her parkinsonism     Parkinson's disease ([place])    Pulmonary HTN ([place])    moderate with PASP 40mHg on echo 09/2019   S/P TKR (total knee replacement), RIGHT 06/09/2012   SCOLIOSIS 03/28/2009   Qualifier: Diagnosis of  By: NNori RiisMD, SClarise Cruz    Situational depression    Substance abuse ([facility]    Tinnitus 02/15/2015   Unclear what this is from. Likely multifactorial.    Type II diabetes mellitus (HCC)    Ulcerative esophagitis    Urticaria, idiopathic 12/21/2013   One month of urticarial rash that is consistent with hives. She also has a lot of dry skin.    UTI (lower urinary tract infection)     Past Surgical History:  Procedure Laterality Date   ABDOMINAL HYSTERECTOMY     BALLOON DILATION N/A 07/12/2020   Procedure: BALLOON DILATION;  Surgeon: GJackquline Denmark MD;  [facility]  Service: Endoscopy;  Laterality: N/A;   BIOPSY  07/12/2020   Procedure: BIOPSY;  Surgeon: GJackquline Denmark MD;  [place]  Service: Endoscopy;;   CHOLECYSTECTOMY OPEN     COLON SURGERY     COLONOSCOPY  07/08/2019   DILATION AND CURETTAGE OF UTERUS     ESOPHAGOGASTRIC FUNDOPLASTY     some type "esoph surgery" per pt   ESOPHAGOGASTRODUODENOSCOPY (EGD) WITH PROPOFOL N/A 08/05/2017   Procedure: ESOPHAGOGASTRODUODENOSCOPY (EGD) WITH PROPOFOL;  Surgeon: PJerene Bears MD;  [place] [facility] [place]  Service: Gastroenterology;  Laterality: N/A;   ESOPHAGOGASTRODUODENOSCOPY (EGD) WITH PROPOFOL N/A 07/12/2020   Procedure: ESOPHAGOGASTRODUODENOSCOPY (EGD) WITH PROPOFOL;  Surgeon: GJackquline Denmark MD;  [facility];  Service: Endoscopy;  Laterality: N/A;  with dil   JOINT REPLACEMENT     TOTAL KNEE ARTHROPLASTY Right 07/2008   TUBAL LIGATION      Current Outpatient Medications  Medication Sig Dispense Refill   acetaminophen (TYLENOL) 325 MG tablet Take 650 mg by mouth every 6 (six) hours as needed for mild pain, fever or headache.     apixaban (ELIQUIS) 5 MG TABS tablet  Take 1 tablet (5 mg total) by mouth 2 (two) times daily. 120 tablet 0   atorvastatin (LIPITOR) 20 MG tablet Take 20 mg by mouth daily.     benztropine (COGENTIN) 0.5 MG tablet Take 1 tablet (0.5 mg total) by mouth 2 (two) times daily. 180 tablet 0   Cholecalciferol (VITAMIN D3) 1.25 MG (50000 UT) CAPS Take 50,000 Units by mouth once a week.     donepezil (ARICEPT) 10 MG tablet Take 10 mg by mouth at bedtime.     donepezil (ARICEPT) 5 MG tablet Take 1 tablet (5 mg total) by mouth at bedtime. 90 tablet 0   doxylamine, Sleep, (UNISOM) 25 MG tablet Take 25 mg by mouth at bedtime as needed for sleep.     gabapentin (NEURONTIN) 300 MG capsule Take 3 capsules at bedtime for neuropathic pain (  Patient taking differently: Take 300 mg by mouth at bedtime.) 90 capsule 3   metoprolol tartrate (LOPRESSOR) 25 MG tablet Take 0.5 tablets (12.5 mg total) by mouth 2 (two) times daily. 90 tablet 0   ondansetron (ZOFRAN) 4 MG tablet Take 1 tablet (4 mg total) by mouth every 8 (eight) hours as needed for nausea or vomiting. 21 tablet 0   pantoprazole (PROTONIX) 40 MG tablet Take 40 mg by mouth 2 (two) times daily.     QUEtiapine (SEROQUEL) 25 MG tablet Take 12.5 mg by mouth 2 (two) times daily.     sertraline (ZOLOFT) 100 MG tablet Take 1 tablet (100 mg total) by mouth daily. 90 tablet 0   traMADol (ULTRAM) 50 MG tablet Take by mouth one half tablet bid prn 30 tablet 1   vitamin B-12 (CYANOCOBALAMIN) 250 MCG tablet Take 250 mcg by mouth daily.     pravastatin (PRAVACHOL) 20 MG tablet Take 1 tablet (20 mg total) by mouth daily. (Patient not taking: Reported on 12/13/2020) 90 tablet 0   No current facility-administered medications for this visit.    Allergies as of 12/26/2020 - Review Complete 12/26/2020  Allergen Reaction Noted   Ace inhibitors Anaphylaxis and Swelling 03/11/2012   Azilect [rasagiline mesylate] Other (See Comments) 09/26/2010   Lisinopril Swelling 05/21/2005    Family History  Problem Relation  Age of Onset   Heart disease Mother    Diabetes Mother    Heart disease Father    Heart attack Sister    Heart disease Sister    Heart attack Brother    Heart disease Brother    Cerebral palsy Son    Colon cancer Neg Hx    Esophageal cancer Neg Hx    Liver cancer Neg Hx    Pancreatic cancer Neg Hx    Stomach cancer Neg Hx    Rectal cancer Neg Hx     Social History   Socioeconomic History   Marital status: Married    Spouse name: [name]   Number of children: 2   Years of education: 14   Highest education level: Not on file  Occupational History   Occupation: Retired    Fish farm manager: NOT EMPLOYED  Tobacco Use   Smoking status: Never   Smokeless tobacco: Never  Vaping Use   Vaping Use: Never used  Substance and Sexual Activity   Alcohol use: Yes    Comment: Occasional   Drug use: No   Sexual activity: Not on file  Other Topics Concern   Not on file  Social History Narrative   Health Care POA:    Emergency Contact: daughter, [name], [phone] husband, [name], [phone]   End of Life Plan:   Who lives with you: Lives with husband 02/07/19   Any pets: Rabbit, [name]   Diet: Patient has a varied diet but struggles with what to eat with hypertension, diabetes, parkinsons   Exercise: Patient does not have any regular exercise routine.   Seatbelts: Patient reports wearing her seatbelt when in vehicle.   Nancy Fetter Exposure/Protection: Patient reports wearing sun block lotion daily.   Hobbies: Watching game shows, writing poetry, writing in journal, volunteering at day program with son.    Caffeine Use: very little occasional      02/07/19 caregiver 2 hrs 2-3 x weekly   Social Determinants of Health   Financial Resource Strain: Not on file  Food Insecurity: Not on file  Transportation Needs: Not on file  Physical Activity: Not on file  Stress:  Not on file  Social Connections: Not on file  Intimate Partner Violence: Not on file    Review of Systems:     Constitutional: No weight loss, fever or chills Cardiovascular: No chest pain Respiratory: No SOB  Gastrointestinal: See HPI and otherwise negative   Physical Exam:  Vital signs: BP 132/70 (BP Location: Left Arm, Patient Position: Sitting, Cuff Size: Normal)   Pulse 60   Ht '5\' 4"'$  (1.626 m) Comment: from previous height  Wt 178 lb 8 oz (81 kg)   BMI 30.64 kg/m   Constitutional:   Pleasant Elderly Caucasian female appears to be in NAD, Well developed, Well nourished, alert and cooperative Respiratory: Respirations even and unlabored. Lungs clear to auscultation bilaterally.   No wheezes, crackles, or rhonchi.  Cardiovascular: Normal S1, S2. No MRG. Regular rate and rhythm. No peripheral edema, cyanosis or pallor.  Gastrointestinal:  Soft, nondistended, nontender. No rebound or guarding. Normal bowel sounds. No appreciable masses or hepatomegaly. Psychiatric: Demonstrates good judgement and reason without abnormal affect or behaviors.+some dementia and trouble with memory recall  RELEVANT LABS AND IMAGING: CBC    Component Value Date/Time   WBC 7.2 12/13/2020 0001   RBC 3.83 (L) 12/13/2020 0001   HGB 11.7 (L) 12/13/2020 0001   HGB 11.9 01/13/2018 1137   HCT 36.0 12/13/2020 0001   HCT 36.2 01/13/2018 1137   PLT 227 12/13/2020 0001   PLT 333 01/13/2018 1137   MCV 94.0 12/13/2020 0001   MCV 86 01/13/2018 1137   MCH 30.5 12/13/2020 0001   MCHC 32.5 12/13/2020 0001   RDW 14.2 12/13/2020 0001   RDW 16.3 (H) 01/13/2018 1137   LYMPHSABS 2.7 08/09/2020 0050   LYMPHSABS 2.0 07/27/2017 1635   MONOABS 0.7 08/09/2020 0050   EOSABS 0.1 08/09/2020 0050   EOSABS 0.1 07/27/2017 1635   BASOSABS 0.1 08/09/2020 0050   BASOSABS 0.0 07/27/2017 1635    CMP     Component Value Date/Time   NA 141 12/13/2020 0001   NA 143 04/20/2019 1229   K 4.1 12/13/2020 0001   CL 106 12/13/2020 0001   CO2 24 12/13/2020 0001   GLUCOSE 112 (H) 12/13/2020 0001   BUN 22 12/13/2020 0001   BUN 21  04/20/2019 1229   CREATININE 1.50 (H) 12/13/2020 0001   CREATININE 1.07 (H) 02/27/2016 1029   CALCIUM 9.5 12/13/2020 0001   PROT 6.9 08/09/2020 0050   PROT 7.2 04/20/2019 1229   ALBUMIN 4.0 08/09/2020 0050   ALBUMIN 4.7 04/20/2019 1229   AST 22 08/09/2020 0050   ALT <5 08/09/2020 0050   ALKPHOS 61 08/09/2020 0050   BILITOT 0.8 08/09/2020 0050   BILITOT 0.4 04/20/2019 1229   GFRNONAA 36 (L) 12/13/2020 0001   GFRNONAA 53 (L) 02/27/2016 1029   GFRAA 45 (L) 04/20/2019 1229   GFRAA 61 02/27/2016 1029    Assessment: 1.  Dysphagia: EGD with dilation in March of this year with LA grade D esophagitis and a stricture, recommendations for repeat EGD if dysphagia continued, now complaining of continued dysphagia 2.  GERD: With above 3.  Parkinson's with dementia  Plan: 1.  Scheduled patient for repeat EGD with dilation with Dr. Hilarie Fredrickson in the [facility].  Did provide the patient with a detailed list of risks for the procedure and she agrees to proceed. Patient is appropriate for endoscopic procedure(s) in the ambulatory ([place]) setting.  She tells me that she can ambulate on her own for short distances. 2.  Continue Pantoprazole 40 mg twice  daily, 30-60 minutes before breakfast and dinner.  Patient tells me she does not need refills of this medication right now. 3.  Patient to follow in clinic per recommendations from Dr. Hilarie Fredrickson after time of procedure.  Ellouise Newer, PA-C [facility] 12/26/2020, 2:21 PM  Cc: [name], MD

## 2020-12-27 NOTE — Progress Notes (Signed)
Addendum: Reviewed and agree with assessment and management plan. Florinda Taflinger M, MD  

## 2021-01-17 NOTE — Telephone Encounter (Signed)
Left message for patient to call office.  

## 2021-01-18 NOTE — Telephone Encounter (Signed)
Left message for patient to call office.  

## 2021-01-23 NOTE — Telephone Encounter (Signed)
Informed patient's husband okay for patient to hold Eliquis. Husband voiced understanding.

## 2021-01-23 NOTE — Telephone Encounter (Signed)
Left message on husband and daughter voicemail to call office.

## 2021-01-25 ENCOUNTER — Emergency Department (HOSPITAL_COMMUNITY): Payer: Medicare (Managed Care)

## 2021-01-25 ENCOUNTER — Other Ambulatory Visit: Payer: Self-pay

## 2021-01-25 ENCOUNTER — Encounter (HOSPITAL_COMMUNITY): Payer: Self-pay | Admitting: Emergency Medicine

## 2021-01-25 ENCOUNTER — Emergency Department (HOSPITAL_COMMUNITY)
Admission: EM | Admit: 2021-01-25 | Discharge: 2021-01-26 | Disposition: A | Discharge status: Home / Self Care | Payer: Medicare (Managed Care) | Attending: Emergency Medicine | Admitting: Emergency Medicine

## 2021-01-25 DIAGNOSIS — Z79899 Other long term (current) drug therapy: Secondary | ICD-10-CM | POA: Diagnosis not present

## 2021-01-25 DIAGNOSIS — E1122 Type 2 diabetes mellitus with diabetic chronic kidney disease: Secondary | ICD-10-CM | POA: Insufficient documentation

## 2021-01-25 DIAGNOSIS — W19XXXA Unspecified fall, initial encounter: Secondary | ICD-10-CM | POA: Insufficient documentation

## 2021-01-25 DIAGNOSIS — I129 Hypertensive chronic kidney disease with stage 1 through stage 4 chronic kidney disease, or unspecified chronic kidney disease: Secondary | ICD-10-CM | POA: Diagnosis not present

## 2021-01-25 DIAGNOSIS — Z96651 Presence of right artificial knee joint: Secondary | ICD-10-CM | POA: Insufficient documentation

## 2021-01-25 DIAGNOSIS — G20 Parkinson's disease: Secondary | ICD-10-CM | POA: Insufficient documentation

## 2021-01-25 DIAGNOSIS — N183 Chronic kidney disease, stage 3 unspecified: Secondary | ICD-10-CM | POA: Insufficient documentation

## 2021-01-25 DIAGNOSIS — F0390 Unspecified dementia without behavioral disturbance: Secondary | ICD-10-CM | POA: Insufficient documentation

## 2021-01-25 DIAGNOSIS — M25561 Pain in right knee: Secondary | ICD-10-CM | POA: Insufficient documentation

## 2021-01-25 DIAGNOSIS — S0990XA Unspecified injury of head, initial encounter: Secondary | ICD-10-CM | POA: Diagnosis not present

## 2021-01-25 DIAGNOSIS — Z7901 Long term (current) use of anticoagulants: Secondary | ICD-10-CM | POA: Diagnosis not present

## 2021-01-25 LAB — CBC
HCT: 35.9 % — ABNORMAL LOW (ref 36.0–46.0)
Hemoglobin: 11.9 g/dL — ABNORMAL LOW (ref 12.0–15.0)
MCH: 31.3 pg (ref 26.0–34.0)
MCHC: 33.1 g/dL (ref 30.0–36.0)
MCV: 94.5 fL (ref 80.0–100.0)
Platelets: 217 10*3/uL (ref 150–400)
RBC: 3.8 MIL/uL — ABNORMAL LOW (ref 3.87–5.11)
RDW: 13.8 % (ref 11.5–15.5)
WBC: 6.8 10*3/uL (ref 4.0–10.5)
nRBC: 0 % (ref 0.0–0.2)

## 2021-01-25 LAB — BASIC METABOLIC PANEL
Anion gap: 9 (ref 5–15)
BUN: 15 mg/dL (ref 8–23)
CO2: 25 mmol/L (ref 22–32)
Calcium: 9.1 mg/dL (ref 8.9–10.3)
Chloride: 107 mmol/L (ref 98–111)
Creatinine, Ser: 1.46 mg/dL — ABNORMAL HIGH (ref 0.44–1.00)
GFR, Estimated: 37 mL/min — ABNORMAL LOW (ref >60)
Glucose, Bld: 118 mg/dL — ABNORMAL HIGH (ref 70–99)
Potassium: 3.7 mmol/L (ref 3.5–5.1)
Sodium: 141 mmol/L (ref 135–145)
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
## 2021-01-26 MED ORDER — ACETAMINOPHEN 325 MG PO TABS
650.0000 mg | ORAL_TABLET | Freq: Once | ORAL | Status: AC
  Administered 2021-01-26: 650 mg via ORAL
  Filled 2021-01-26: qty 2

## 2021-01-26 NOTE — ED Notes (Signed)
Pt is restless and attempting to get out of bed. Pt is pleasant and easily redirectable.

## 2021-01-26 NOTE — ED Notes (Signed)
Patients husband Wateen Varon [phone]

## 2021-01-26 NOTE — ED Notes (Signed)
Pt resting in bed. States that she is having pain in her back and hips state that she had this pain prior to her fall yesterday   Pt does not appear in distress, respirations are even and non-labored
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
## 2021-01-26 NOTE — Discharge Instructions (Addendum)
Please take Tylenol [id number] mg every 6 hours for aches and pains as needed.  You may always return to the ER for any new or concerning symptoms.

## 2021-01-26 NOTE — NC FL2 (Signed)
[place] LEVEL OF CARE SCREENING TOOL     IDENTIFICATION  [name] Birthdate: 1945-07-04 Sex: female Admission Date (Current Location): 01/25/2021  [facility] and [place] Number:  Herbalist and Address:  [facility], [place] [address], [place], [place] [address]      Provider Number: [id number]  Attending Physician Name and Address:  Pattricia Boss, MD  Relative Name and Phone Number:  Terissa Haffey, spouse, [phone]    Current Level of Care: Home Recommended Level of Care: Memory Care Prior Approval Number:    Date Approved/Denied:   PASRR Number: NCMust down at time of preparation  Discharge Plan: SNF    Current Diagnoses: Patient Active Problem List   Diagnosis Date Noted   Atrial fibrillation ([place]) 07/10/2020   Pulmonary HTN ([place])    Hiatal hernia with GERD and esophagitis 08/20/2017   Osteoarthritis of left knee 07/28/2017   Frequent falls 05/14/2016   CKD stage 3 due to type 2 diabetes mellitus (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   Substance abuse, episodic 09/20/2010   Esophageal dysmotility 08/12/2010   SCOLIOSIS 03/28/2009   Diabetes mellitus type 2, diet-controlled ([place]) 08/05/2006   Hyperlipidemia 08/05/2006   Essential hypertension 08/05/2006   Osteoarthritis of multiple joints 08/04/2006   Major depressive disorder, recurrent episode ([place]) 06/11/2006    Orientation RESPIRATION BLADDER Height & Weight     Place, Self  Normal Continent Weight:   Height:     BEHAVIORAL SYMPTOMS/MOOD NEUROLOGICAL BOWEL NUTRITION STATUS  [place]   Continent    AMBULATORY STATUS COMMUNICATION OF NEEDS Skin   Limited Assist Verbally Skin abrasions                       Personal Care Assistance Level of Assistance   Bathing, Feeding, Dressing Bathing Assistance: Limited assistance Feeding assistance: Independent Dressing Assistance: Limited assistance     Functional Limitations Info             SPECIAL CARE FACTORS FREQUENCY                       Contractures Contractures Info: Not present    Additional Factors Info  Code Status, Allergies Code Status Info: Full Allergies Info: ace inhibitors, lisinopril, azilect           Current Medications (01/26/2021):  This is the current hospital active medication list No current facility-administered medications for this encounter.   Current Outpatient Medications  Medication Sig Dispense Refill   acetaminophen (TYLENOL) 325 MG tablet Take 650 mg by mouth every 6 (six) hours as needed for mild pain, fever or headache.     apixaban (ELIQUIS) 5 MG TABS tablet Take 1 tablet (5 mg total) by mouth 2 (two) times daily. 120 tablet 0   atorvastatin (LIPITOR) 20 MG tablet Take 20 mg by mouth daily.     benztropine (COGENTIN) 0.5 MG tablet Take 1 tablet (0.5 mg total) by mouth 2 (two) times daily. 180 tablet 0   Cholecalciferol (VITAMIN D3) 1.25 MG (50000 UT) CAPS Take 50,000 Units by mouth once a week.     donepezil (ARICEPT) 10 MG tablet Take 10 mg by mouth at bedtime.     donepezil (ARICEPT) 5 MG tablet Take 1 tablet (5 mg total) by  mouth at bedtime. 90 tablet 0   doxylamine, Sleep, (UNISOM) 25 MG tablet Take 25 mg by mouth at bedtime as needed for sleep.     gabapentin (NEURONTIN) 300 MG capsule Take 3 capsules at bedtime for neuropathic pain (Patient taking differently: Take 300 mg by mouth at bedtime.) 90 capsule 3   metoprolol tartrate (LOPRESSOR) 25 MG tablet Take 0.5 tablets (12.5 mg total) by mouth 2 (two) times daily. 90 tablet 0   ondansetron (ZOFRAN) 4 MG tablet Take 1 tablet (4 mg total) by mouth every 8 (eight) hours as needed for nausea or vomiting. 21 tablet 0   pantoprazole (PROTONIX) 40 MG tablet Take 40 mg by mouth 2 (two)  times daily.     pravastatin (PRAVACHOL) 20 MG tablet Take 1 tablet (20 mg total) by mouth daily. (Patient not taking: Reported on 12/13/2020) 90 tablet 0   QUEtiapine (SEROQUEL) 25 MG tablet Take 12.5 mg by mouth 2 (two) times daily.     sertraline (ZOLOFT) 100 MG tablet Take 1 tablet (100 mg total) by mouth daily. 90 tablet 0   traMADol (ULTRAM) 50 MG tablet Take by mouth one half tablet bid prn 30 tablet 1   vitamin B-12 (CYANOCOBALAMIN) 250 MCG tablet Take 250 mcg by mouth daily.       Discharge Medications: Please see discharge summary for a list of discharge medications.  Relevant Imaging Results:  Relevant Lab Results:   Additional Information SSN: [id number]  Alfredia Ferguson, LCSW

## 2021-01-26 NOTE — ED Notes (Signed)
Pt is ambulating disoriented in lobby
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
## 2021-01-27 ENCOUNTER — Encounter (HOSPITAL_COMMUNITY): Payer: Self-pay | Admitting: *Deleted

## 2021-01-27 ENCOUNTER — Emergency Department (HOSPITAL_COMMUNITY)
Admission: EM | Admit: 2021-01-27 | Discharge: 2021-01-29 | Disposition: A | Discharge status: Home / Self Care | Payer: Medicare (Managed Care) | Attending: Medical | Admitting: Medical

## 2021-01-27 ENCOUNTER — Other Ambulatory Visit: Payer: Self-pay

## 2021-01-27 DIAGNOSIS — F0390 Unspecified dementia without behavioral disturbance: Secondary | ICD-10-CM | POA: Insufficient documentation

## 2021-01-27 DIAGNOSIS — Z20822 Contact with and (suspected) exposure to covid-19: Secondary | ICD-10-CM | POA: Insufficient documentation

## 2021-01-27 DIAGNOSIS — S40011A Contusion of right shoulder, initial encounter: Secondary | ICD-10-CM | POA: Insufficient documentation

## 2021-01-27 DIAGNOSIS — E1122 Type 2 diabetes mellitus with diabetic chronic kidney disease: Secondary | ICD-10-CM | POA: Insufficient documentation

## 2021-01-27 DIAGNOSIS — Z046 Encounter for general psychiatric examination, requested by authority: Secondary | ICD-10-CM

## 2021-01-27 DIAGNOSIS — I129 Hypertensive chronic kidney disease with stage 1 through stage 4 chronic kidney disease, or unspecified chronic kidney disease: Secondary | ICD-10-CM | POA: Diagnosis not present

## 2021-01-27 DIAGNOSIS — G20 Parkinson's disease: Secondary | ICD-10-CM | POA: Diagnosis not present

## 2021-01-27 DIAGNOSIS — I4891 Unspecified atrial fibrillation: Secondary | ICD-10-CM | POA: Diagnosis not present

## 2021-01-27 DIAGNOSIS — Z7901 Long term (current) use of anticoagulants: Secondary | ICD-10-CM | POA: Diagnosis not present

## 2021-01-27 DIAGNOSIS — Z96651 Presence of right artificial knee joint: Secondary | ICD-10-CM | POA: Insufficient documentation

## 2021-01-27 DIAGNOSIS — N183 Chronic kidney disease, stage 3 unspecified: Secondary | ICD-10-CM | POA: Diagnosis not present

## 2021-01-27 DIAGNOSIS — Z79899 Other long term (current) drug therapy: Secondary | ICD-10-CM | POA: Diagnosis not present

## 2021-01-27 DIAGNOSIS — S40012A Contusion of left shoulder, initial encounter: Secondary | ICD-10-CM | POA: Insufficient documentation

## 2021-01-27 DIAGNOSIS — S4992XA Unspecified injury of left shoulder and upper arm, initial encounter: Secondary | ICD-10-CM | POA: Diagnosis present

## 2021-01-27 LAB — CBC WITH DIFFERENTIAL/PLATELET
Abs Immature Granulocytes: 0.02 10*3/uL (ref 0.00–0.07)
Basophils Absolute: 0 10*3/uL (ref 0.0–0.1)
Basophils Relative: 1 %
Eosinophils Absolute: 0 10*3/uL (ref 0.0–0.5)
Eosinophils Relative: 1 %
HCT: 38.9 % (ref 36.0–46.0)
Hemoglobin: 12.6 g/dL (ref 12.0–15.0)
Immature Granulocytes: 0 %
Lymphocytes Relative: 20 %
Lymphs Abs: 1.3 10*3/uL (ref 0.7–4.0)
MCH: 30.9 pg (ref 26.0–34.0)
MCHC: 32.4 g/dL (ref 30.0–36.0)
MCV: 95.3 fL (ref 80.0–100.0)
Monocytes Absolute: 0.6 10*3/uL (ref 0.1–1.0)
Monocytes Relative: 9 %
Neutro Abs: 4.6 10*3/uL (ref 1.7–7.7)
Neutrophils Relative %: 69 %
Platelets: 215 10*3/uL (ref 150–400)
RBC: 4.08 MIL/uL (ref 3.87–5.11)
RDW: 13.7 % (ref 11.5–15.5)
WBC: 6.5 10*3/uL (ref 4.0–10.5)
nRBC: 0 % (ref 0.0–0.2)

## 2021-01-27 LAB — COMPREHENSIVE METABOLIC PANEL
ALT: 13 U/L (ref 0–44)
AST: 26 U/L (ref 15–41)
Albumin: 3.9 g/dL (ref 3.5–5.0)
Alkaline Phosphatase: 73 U/L (ref 38–126)
Anion gap: 10 (ref 5–15)
BUN: 15 mg/dL (ref 8–23)
CO2: 22 mmol/L (ref 22–32)
Calcium: 9.2 mg/dL (ref 8.9–10.3)
Chloride: 106 mmol/L (ref 98–111)
Creatinine, Ser: 1.31 mg/dL — ABNORMAL HIGH (ref 0.44–1.00)
GFR, Estimated: 42 mL/min — ABNORMAL LOW (ref >60)
Glucose, Bld: 93 mg/dL (ref 70–99)
Potassium: 3.6 mmol/L (ref 3.5–5.1)
Sodium: 138 mmol/L (ref 135–145)
Total Bilirubin: 1.4 mg/dL — ABNORMAL HIGH (ref 0.3–1.2)
Total Protein: 7 g/dL (ref 6.5–8.1)

## 2021-01-27 LAB — ETHANOL: Alcohol, Ethyl (B): <10 mg/dL (ref <10)
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
## 2021-01-28 ENCOUNTER — Emergency Department (HOSPITAL_COMMUNITY): Payer: Medicare (Managed Care)

## 2021-01-28 LAB — RESP PANEL BY RT-PCR (FLU A&B, COVID) ARPGX2
Influenza A by PCR: NEGATIVE
Influenza B by PCR: NEGATIVE
SARS Coronavirus 2 by RT PCR: NEGATIVE

## 2021-01-28 LAB — RAPID URINE DRUG SCREEN, HOSP PERFORMED
Amphetamines: NOT DETECTED
Barbiturates: NOT DETECTED
Benzodiazepines: NOT DETECTED
Cocaine: NOT DETECTED
Opiates: NOT DETECTED
Tetrahydrocannabinol: NOT DETECTED

## 2021-01-28 MED ORDER — ACETAMINOPHEN 325 MG PO TABS
650.0000 mg | ORAL_TABLET | ORAL | Status: DC | PRN
  Administered 2021-01-28: 650 mg via ORAL
  Filled 2021-01-28: qty 2

## 2021-01-28 NOTE — ED Notes (Signed)
[facility] to be called for patient

## 2021-01-28 NOTE — ED Notes (Signed)
Lyra Alaimo husband [phone] requesting an update on the patient
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
## 2021-01-28 NOTE — ED Notes (Signed)
Patient transported to CT 
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
## 2021-01-28 NOTE — ED Notes (Signed)
This RN consulted Dr Sherry Ruffing to get update on plan for patient. Dr. Sherry Ruffing to speak with and assess pt at bedside - IVC to be rescinded and pt to return home

## 2021-01-28 NOTE — ED Notes (Signed)
Pt back from CT and Xray. No acute changes noted. Will continue to monitor.

## 2021-01-28 NOTE — Discharge Planning (Signed)
Buddy Duty, SW with [facility] [phone]) called regarding placing pt in geropsychiatry facility.  Her team is supportive of geropsych placement.

## 2021-01-28 NOTE — ED Notes (Signed)
IVC paperwork rescind

## 2021-01-28 NOTE — ED Notes (Signed)
Pt back to her hallway room at this time. Pt done talking with TTS at this time. No acute changes noted. Will continue to monitor. Pt given diet ginger ale per request.

## 2021-01-28 NOTE — ED Notes (Signed)
[facility] was called, 9th in list
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
## 2021-01-28 NOTE — BH Assessment (Signed)
[place] Assessment Progress Note   Per Shuvon Rankin, NP, this pt does not require psychiatric hospitalization at this time.  Pt is psychiatrically cleared.  Discharge instructions advise pt to continue treatment with her current unnamed outpatient provider.  EDP Joseph Berkshire, MD and pt's nurse, Baxter Flattery, have been notified.  Jalene Mullet, [place] Triage Specialist [phone]

## 2021-01-28 NOTE — ED Provider Notes (Signed)
Nursing reports that patient has been psychiatrically cleared.  Therefore patient plan was to have her IVC rescinded and discharged back home.  I filled out the paperwork for IVC rescinding.  I spoke to the patient and she is denying any complaints aside from mild headache so we will give some Tylenol.  Nursing reports that when they spoke to family, family had concerned about her coming back home.  Will place a consult to the transitions of care and case management team to discuss further disposition plans.   Team spoke to husband who reportedly feels patient can come home.  Patient will be transported home.    Clinical Impression: 1. Involuntary commitment     Disposition: Discharge  Condition: Good  I have discussed the results, Dx and Tx plan with the pt(& family if present). He/she/they expressed understanding and agree(s) with the plan. Discharge instructions discussed at great length. Strict return precautions discussed and pt &/or family have verbalized understanding of the instructions. No further questions at time of discharge.    New Prescriptions   No medications on file    Follow Up: [name], MD [address] [place] [address] [place] [address] [id number] mc [place] [phone]         Derrika Ruffalo, Gwenyth Allegra, MD 01/28/21 2026

## 2021-01-28 NOTE — Social Work (Signed)
CSW spoke with husband, Rosealie Reach, via phone @ [phone]. Mr. Tamargo stated that he was under the impression that [facility] was coordinating with [facility] and that Pt was to be inpatient at [facility].  CSW explained her role, that she did not evaluate Pt, but that Pt has been seen by [facility] and has been psych cleared and is medically cleared and is ready to discharge home. Husband expressed disappointment but stated that since she is cleared that ED should "just send her on home". CSW asked for clarification that husband is home and received such clarification. Husband states that he will care for Pt and to send her home. CSW informed husband that Pt will be sent home via [facility].

## 2021-01-28 NOTE — ED Notes (Signed)
Pt moved to room 46 for TTS evaluation. Pt resting on stretcher, talking with counselor. No acute changes noted. Will continue to monitor.

## 2021-01-28 NOTE — Discharge Instructions (Signed)
For your behavioral health needs you are advised to continue treatment with your current outpatient provider.   

## 2021-01-29 NOTE — ED Notes (Signed)
Pt was able to ambulate to the restroom with assistance. Pt refusing her breakfast just wanted to rest

## 2021-01-29 NOTE — ED Notes (Addendum)
Pt ambulatory to restroom, brief changed

## 2021-01-29 NOTE — ED Notes (Signed)
RN called pt's daughter to ask if she had been in touch with pt's husband, daughter states she was also unable to reach pt's husband this AM.  Pt received breakfast tray. Pt was ambulated to BR and was continent of urine. No distress noted, awaiting husband for dispo.

## 2021-01-29 NOTE — ED Notes (Signed)
Pt has been cleared to d/c home, PTAR came to pick up pt overnight but night shift RN unable to reach pt's husband/caretaker at the home so PTAR deferred transfer until confirmation of husband's presence in the home. This RN called both the house phone listed for husband (rang indefinitely, no voicemail) and cell phone (left voicemail).  Pt resting in stretcher with eyes closed, no distress noted. Psychiatrically cleared, medically cleared, waiting for transport. Night shift Network engineer ordered breakfast tray. RN has pt's belongings at the desk pending transport.
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
## 2021-01-29 NOTE — Progress Notes (Signed)
CSW contacted patients husband. Patients husband stated he would not be home today until 4:30 PM. CSW stated the hospital has been trying to reach him since yesterday to get his wife home. Patients husband stated his phone has been down.

## 2021-01-29 NOTE — ED Notes (Addendum)
PTAR here to get patient, patient husband not answering phone, PTAR to run other calls and see if husband will answer and then come back to possibly get patient - this RN to keep trying and reaching the husband

## 2021-01-29 NOTE — ED Notes (Signed)
This RN tried to contact pt husband multiple times - message left for husband

## 2021-01-29 NOTE — ED Notes (Signed)
Breakfast Orders placed 

## 2021-02-04 ENCOUNTER — Telehealth: Payer: Self-pay

## 2021-02-04 NOTE — Telephone Encounter (Signed)
Noted. Thanks.

## 2021-02-04 NOTE — Telephone Encounter (Signed)
Reviewing charts today for Wednesday, noticed pt had 3 ED visits last week and one was "involuntary commitment" . Call to pt's dtr to see if they are still planning on her coming for her EGD on Wednesday, pt dtr apologized and states she wants to cancel and meant to call as pt is in the hospital and her dementia has worsened at this point.  States she will call to r/s egd as soon as pt is able to have the procedure.

## 2021-02-06 ENCOUNTER — Encounter: Payer: Medicare (Managed Care) | Admitting: Internal Medicine

## 2021-03-18 ENCOUNTER — Encounter: Payer: Self-pay | Admitting: Family Medicine

## 2021-04-14 DEATH — deceased

## 2021-09-17 ENCOUNTER — Encounter: Payer: Self-pay | Admitting: *Deleted
